# Patient Record
Sex: Female | Born: 1975
Health system: Southern US, Community
[De-identification: ages and names within clinical notes are randomized; demographics above are authoritative.]

## PROBLEM LIST (undated history)

## (undated) DIAGNOSIS — E119 Type 2 diabetes mellitus without complications: Secondary | ICD-10-CM

## (undated) DIAGNOSIS — I10 Essential (primary) hypertension: Secondary | ICD-10-CM

## (undated) DIAGNOSIS — R000 Tachycardia, unspecified: Secondary | ICD-10-CM

## (undated) DIAGNOSIS — D169 Benign neoplasm of bone and articular cartilage, unspecified: Secondary | ICD-10-CM

## (undated) DIAGNOSIS — F419 Anxiety disorder, unspecified: Secondary | ICD-10-CM

## (undated) DIAGNOSIS — I493 Ventricular premature depolarization: Secondary | ICD-10-CM

## (undated) DIAGNOSIS — R002 Palpitations: Secondary | ICD-10-CM

## (undated) DIAGNOSIS — I499 Cardiac arrhythmia, unspecified: Secondary | ICD-10-CM

## (undated) DIAGNOSIS — I340 Nonrheumatic mitral (valve) insufficiency: Secondary | ICD-10-CM

## (undated) DIAGNOSIS — E669 Obesity, unspecified: Secondary | ICD-10-CM

## (undated) DIAGNOSIS — Z1239 Encounter for other screening for malignant neoplasm of breast: Secondary | ICD-10-CM

## (undated) DIAGNOSIS — E611 Iron deficiency: Secondary | ICD-10-CM

## (undated) DIAGNOSIS — R220 Localized swelling, mass and lump, head: Secondary | ICD-10-CM

## (undated) HISTORY — DX: Type 2 diabetes mellitus without complications: E11.9

## (undated) HISTORY — DX: Tachycardia, unspecified: R00.0

## (undated) HISTORY — DX: Anxiety disorder, unspecified: F41.9

## (undated) HISTORY — DX: Nonrheumatic mitral (valve) insufficiency: I34.0

## (undated) HISTORY — DX: Localized swelling, mass and lump, head: R22.0

## (undated) HISTORY — PX: BUNIONECTOMY: SHX129

## (undated) HISTORY — DX: Ventricular premature depolarization: I49.3

## (undated) HISTORY — PX: OTHER SURGICAL HISTORY: SHX169

## (undated) HISTORY — PX: ANKLE FRACTURE SURGERY: SHX122

## (undated) HISTORY — DX: Palpitations: R00.2

## (undated) HISTORY — DX: Cardiac arrhythmia, unspecified: I49.9

## (undated) HISTORY — DX: Essential (primary) hypertension: I10

## (undated) HISTORY — DX: Encounter for other screening for malignant neoplasm of breast: Z12.39

---

## 1997-07-28 ENCOUNTER — Emergency Department (HOSPITAL_COMMUNITY): Admission: EM | Admit: 1997-07-28 | Discharge: 1997-07-28 | Payer: Self-pay | Admitting: *Deleted

## 1998-03-19 ENCOUNTER — Inpatient Hospital Stay (HOSPITAL_COMMUNITY): Admission: AD | Admit: 1998-03-19 | Discharge: 1998-03-24 | Payer: Self-pay | Admitting: Obstetrics & Gynecology

## 1998-04-15 ENCOUNTER — Ambulatory Visit (HOSPITAL_COMMUNITY): Admission: RE | Admit: 1998-04-15 | Discharge: 1998-04-15 | Payer: Self-pay | Admitting: Obstetrics

## 1998-04-21 ENCOUNTER — Encounter: Admission: RE | Admit: 1998-04-21 | Discharge: 1998-04-21 | Payer: Self-pay | Admitting: Pediatrics

## 1998-08-05 ENCOUNTER — Ambulatory Visit (HOSPITAL_COMMUNITY): Admission: RE | Admit: 1998-08-05 | Discharge: 1998-08-05 | Payer: Self-pay | Admitting: *Deleted

## 1998-08-05 ENCOUNTER — Encounter: Payer: Self-pay | Admitting: *Deleted

## 1998-09-04 ENCOUNTER — Inpatient Hospital Stay (HOSPITAL_COMMUNITY): Admission: AD | Admit: 1998-09-04 | Discharge: 1998-09-04 | Payer: Self-pay | Admitting: *Deleted

## 1998-09-16 ENCOUNTER — Inpatient Hospital Stay (HOSPITAL_COMMUNITY): Admission: AD | Admit: 1998-09-16 | Discharge: 1998-09-18 | Payer: Self-pay | Admitting: *Deleted

## 1998-09-21 ENCOUNTER — Encounter (HOSPITAL_COMMUNITY): Admission: RE | Admit: 1998-09-21 | Discharge: 1998-12-20 | Payer: Self-pay | Admitting: *Deleted

## 2000-10-12 ENCOUNTER — Other Ambulatory Visit: Admission: RE | Admit: 2000-10-12 | Discharge: 2000-10-12 | Payer: Self-pay | Admitting: Obstetrics and Gynecology

## 2002-04-16 ENCOUNTER — Other Ambulatory Visit: Admission: RE | Admit: 2002-04-16 | Discharge: 2002-04-16 | Payer: Self-pay | Admitting: Obstetrics and Gynecology

## 2003-06-11 ENCOUNTER — Emergency Department (HOSPITAL_COMMUNITY): Admission: EM | Admit: 2003-06-11 | Discharge: 2003-06-11 | Payer: Self-pay | Admitting: Family Medicine

## 2003-11-19 ENCOUNTER — Other Ambulatory Visit: Admission: RE | Admit: 2003-11-19 | Discharge: 2003-11-19 | Payer: Self-pay | Admitting: Obstetrics and Gynecology

## 2005-04-14 ENCOUNTER — Other Ambulatory Visit: Admission: RE | Admit: 2005-04-14 | Discharge: 2005-04-14 | Payer: Self-pay | Admitting: Obstetrics and Gynecology

## 2005-06-03 ENCOUNTER — Inpatient Hospital Stay (HOSPITAL_COMMUNITY): Admission: AD | Admit: 2005-06-03 | Discharge: 2005-06-04 | Payer: Self-pay | Admitting: Obstetrics and Gynecology

## 2006-01-04 ENCOUNTER — Inpatient Hospital Stay (HOSPITAL_COMMUNITY): Admission: AD | Admit: 2006-01-04 | Discharge: 2006-01-06 | Payer: Self-pay | Admitting: Obstetrics and Gynecology

## 2007-03-24 ENCOUNTER — Emergency Department (HOSPITAL_COMMUNITY): Admission: EM | Admit: 2007-03-24 | Discharge: 2007-03-24 | Payer: Self-pay | Admitting: Emergency Medicine

## 2009-02-07 ENCOUNTER — Emergency Department (HOSPITAL_COMMUNITY): Admission: EM | Admit: 2009-02-07 | Discharge: 2009-02-08 | Payer: Self-pay | Admitting: Emergency Medicine

## 2010-04-25 LAB — POCT I-STAT, CHEM 8
Calcium, Ion: 1.1 mmol/L — ABNORMAL LOW (ref 1.12–1.32)
Chloride: 106 mEq/L (ref 96–112)
HCT: 44 % (ref 36.0–46.0)
Sodium: 141 mEq/L (ref 135–145)

## 2010-04-25 LAB — POCT CARDIAC MARKERS
Myoglobin, poc: 40.6 ng/mL (ref 12–200)
Troponin i, poc: <0.05 ng/mL (ref 0.00–0.09)

## 2010-04-25 LAB — CBC
HCT: 40.7 % (ref 36.0–46.0)
Hemoglobin: 13.4 g/dL (ref 12.0–15.0)
MCHC: 33.1 g/dL (ref 30.0–36.0)
MCV: 85.8 fL (ref 78.0–100.0)
Platelets: 380 10*3/uL (ref 150–400)
RBC: 4.74 MIL/uL (ref 3.87–5.11)
RDW: 13.9 % (ref 11.5–15.5)
WBC: 6.9 10*3/uL (ref 4.0–10.5)

## 2010-04-25 LAB — DIFFERENTIAL
Basophils Relative: 0 % (ref 0–1)
Eosinophils Absolute: 0.1 10*3/uL (ref 0.0–0.7)
Eosinophils Relative: 1 % (ref 0–5)
Monocytes Absolute: 0.6 10*3/uL (ref 0.1–1.0)
Neutro Abs: 3.8 10*3/uL (ref 1.7–7.7)

## 2010-06-25 NOTE — Op Note (Signed)
Kaylee Maxwell, LATELLA             ACCOUNT NO.:  [id number]   MEDICAL RECORD NO.:  [id number]          PATIENT TYPE:  INP   LOCATION:  9106                          FACILITY:  [facility]   PHYSICIAN:  Janine Limbo, M.D.DATE OF BIRTH:  04/29/1975   DATE OF PROCEDURE:  01/05/2006  DATE OF DISCHARGE:                               OPERATIVE REPORT   PREOPERATIVE DIAGNOSIS:  Left breast abscess.   POSTOPERATIVE DIAGNOSIS:  Left breast abscess.   PROCEDURE:  Incision and drainage of left breast abscess.   SURGEON:  Dr. Leonard Schwartz.   FIRST ASSISTANT:  None.   ANESTHETIC:  Buffered 1% lidocaine.   DISPOSITION:  [name] is a 35 year old female who is one day after a  vaginal delivery of a healthy female infant.  The patient reports having a  pimple under her left breast that has now progressed to the point that  it is approximately 5 cm in size.  The area is very painful.  She has  never had a lesion similar to this in the past.  The patient understands  the indications for her surgical procedure, and she accepts the  associated risks.   FINDINGS:  A 5 cm abscess that was pointed was noted beneath the left  breast.  There was no evidence of spreading cellulitis.   PROCEDURE:  The patient was placed in a supine position.  The left  breast was prepped with multiple layers of Betadine.  The abscess was  injected with 3 mL of 1% lidocaine that was buffered.  An incision was  made in the abscess, and a moderate to large amount of purulent material  was drained.  A culture was obtained from the abscess cavity.  The  cavity was then packed using Iodoform gauze.  Hemostasis was adequate.  The patient tolerated her procedure well.  The area was then cleaned and  then dressed with sterile 4 x 4 gauze.   FOLLOW UP PLANS:  The patient will be placed on Keflex 500 mg three  times a day.  She will be observed in the hospital for one more day and  then will be discharged to home.  The  gauze will be removed on  01/06/2006.      Janine Limbo, M.D.  Electronically Signed     AVS/MEDQ  D:  01/05/2006  T:  01/05/2006  Job:  [id number]

## 2010-06-25 NOTE — H&P (Signed)
NAMEKEELIN, NEVILLE             ACCOUNT NO.:  [id number]   MEDICAL RECORD NO.:  [id number]          PATIENT TYPE:  INP   LOCATION:  NA                            FACILITY:  [facility]   PHYSICIAN:  Naima A. Dillard, M.D. DATE OF BIRTH:  [birth year]   DATE OF ADMISSION:  01/04/2006  DATE OF DISCHARGE:                              HISTORY & PHYSICAL   Ms. Riso is a 35 year old gravida 3, para 1-0-1-1 at 29 weeks who  presents today for induction secondary to [facility].  She had had some mild  elevations of her blood pressure over the last week.  She returned a 24-  hour urine sample to the office today and her blood pressure was 150/100  and 130/90.  She is therefore to be admitted for induction of labor  secondary to [facility].  Pregnancy has been remarkable for:  1. Rubella negative.  2. 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  3. First trimester Trichomonas.  4. Previous smoker.  5. Positive group B Strep.   PRENATAL LABS:  O positive, RH antibody negative.  VDRL nonreactive.  Rubella titer is not immune.  Hepatitis B surface antigen negative.  HIV  nonreactive.  Sickle cell test was negative.  HSV-1 glycoprotein was  negative.  HSV-2 glycoprotein was positive.  GC and Chlamydia cultures  were negative in the first trimester.  Pap in March had Trichomonas.  Hemoglobin upon entry into practice was 11.  It was within normal limits  at 26 weeks.  Patient had a 1-hour Glucola at approximately 22 weeks for  obesity.  Fasting blood sugar was 83, one hour was 165, two hour was  elevated at 183 and three hour was 127.  Dr. Pennie Rushing was consulted.  The  plan was made for fasting blood sugars and two hours PCs at 28, 32 and  36 weeks.  Fasting blood sugar at 29 weeks was 93, two hour PC was 95.  She did have some glycosuria.  Fasting blood sugar at 35 weeks was 82,  two hour after breakfast was 119.  CBGs were discontinued at 36 weeks.  Group B Strep culture was  positive at 36 weeks.   HISTORY OF PRESENT PREGNANCY:  Patient entered care at approximately 8  weeks.  She had an ultrasound at that time secondary to some first  trimester spotting.  She had Trichomonas on Pap which was treated.  Patient had history of HSV but had never had an outbreak but had been  told in the past she had HSV.  She had HSV-2 glycoprotein was positive.  Ultrasound at 19 weeks for normal growth.  She had an early Glucola  secondary to obesity.  She has had one abnormal value on the two hour  mark.  Plans were made to follow this up with fasting and two hour PCs  at intervals.  She did have some episodes of tachycardia.  These were  not associated with any dyspnea.  These were monitored and did resolve  by 23-24 weeks.  She had some fasting blood sugars  done through her  pregnancy that were all normal.  She did have some occasional glycosuria  but blood sugars were okay except for one of them when she had a sugared  soda on the way to the office. Group B Strep culture was positive.  Her  pressure was slightly elevated on her last visit.  She was sent home to  do a 24-hour urine. She brought that back today and her blood pressure  was 150/80 and 139/90.  She had a trace of protein in her urine. The  decision was made to admit her for induction.  She also has a boil on  her left breast that will be evaluated while she is here.   ALLERGIES:  NO KNOWN DRUG ALLERGIES.   OBSTETRICAL HISTORY:  In 2000, she had a vaginal birth of a female infant,  weight 6 pounds 7 ounces at 40 weeks. She was in labor 11 hours.  She  had epidural anesthesia.  Child was delivered by Dr. Wiliam Ke at [facility].  She did have some borderline anemia with her first pregnancy.  She had severe nausea and vomiting at that time.  In 2004, she had a  first trimester pregnancy termination.   PAST MEDICAL HISTORY:  1. History of abnormal Pap in the past but all have been normal since      then.  2.  In 2004, she was treated for Chlamydia.  3. She was diagnosed with HSV a number of years ago but had not had      any outbreaks since that time.  This was confirmed with positive HS-      2 glycoprotein titer.  4. She had trichomonas a number of years ago and also on this early      Pap.  5. She reports the usual childhood illnesses.   PAST SURGICAL HISTORY:  Surgery on her right ankle, right knee, left  ankle and left knee.  Her only other hospitalization was for childbirth.   FAMILY HISTORY:  Her paternal grandfather has a leaking heart valve.  Her paternal aunt, paternal grandmother and maternal first cousin all  have hypertension.  Her maternal aunt had thrombophlebitis.  Maternal  grandmother is diabetic on insulin.  The patient's maternal first cousin  had kidney failure.  Paternal grandmother has Alzheimer's.  Paternal  grandfather had a stroke.  Paternal grandfather also had rheumatoid  arthritis.  Paternal aunt had gallbladder cancer.   GENETIC HISTORY:  Patient's sister having sickle cell disease and  patient's maternal first cousin having sickle cell trait.  Patient's  maternal grandmother is a twin and her maternal first cousin has twins.   SOCIAL HISTORY:  Patient is single.  Father of the baby is involved and  supportive, his name is Donnetta Simpers.  Patient is Tree surgeon.  She  has two years of college.  She is a Actor.  Her partner  has a high school education.  He is a Child psychotherapist.  She denies any  alcohol, drug or tobacco use during this pregnancy.  She denies any  domestic violence.   PHYSICAL EXAMINATION:  VITAL SIGNS:  Blood pressure at the office was  150/100, 130/90.  Other vital signs were stable.  HEENT:  Within normal limits.  LUNGS:  Bilateral breath sounds are clear.  CARDIOVASCULAR:  Regular rate and rhythm without murmur.  BREASTS:  Soft and nontender. ABDOMEN:  Fundal height is approximately 38 cm.  Estimated fetal weight   is 7  to 7-1/2 pounds.  Uterine contractions are occasional and mild per  the patient's report.  CERVIX:  Cervical exam in the office was 1-2, 80%, vertex at -2 to -3  station but the vertex is not ballotable.  Fetal heart rate is in the  140s in the office with Doppler.  EXTREMITIES:  Deep tendon reflexes are 2+ without clonus.  There is  trace edema noted.   IMPRESSION:  1. Intrauterine pregnancy at 39 weeks.  2. Pregnancy induced hypertension.  3. Positive group B Strep.   PLAN:  1. Admit to birthing suite per consult with Dr. Pierre Bali. Dillard as      attending physician.  2. Routine certified nurse midwife orders.  3. Plan group B Strep prophylaxis with Penicillin G per standing      dosing.  4. Will watch blood pressure very carefully and will await 24-hour      urine.  If the patient's is preeclamptic, she will be transferred      to the physician's service.  If not, she will remain with the nurse      midwife service.      Renaldo Reel Emilee Hero, C.N.M.      Naima A. Normand Sloop, M.D.  Electronically Signed    VLL/MEDQ  D:  01/04/2006  T:  01/04/2006  Job:  [id number]

## 2010-10-29 LAB — URINE MICROSCOPIC-ADD ON

## 2010-10-29 LAB — I-STAT 8, (EC8 V) (CONVERTED LAB)
BUN: 4 — ABNORMAL LOW
Glucose, Bld: 115 — ABNORMAL HIGH
Hemoglobin: 13.6
Potassium: 3.3 — ABNORMAL LOW
Sodium: 139

## 2010-10-29 LAB — URINALYSIS, ROUTINE W REFLEX MICROSCOPIC
Bilirubin Urine: NEGATIVE
Nitrite: POSITIVE — AB
Specific Gravity, Urine: 1.021
Urobilinogen, UA: 1

## 2010-10-29 LAB — DIFFERENTIAL
Basophils Absolute: 0.1
Eosinophils Absolute: 0.1
Eosinophils Relative: 1
Lymphocytes Relative: 37

## 2010-10-29 LAB — CBC
HCT: 35.7 — ABNORMAL LOW
MCV: 80.4
Platelets: 459 — ABNORMAL HIGH
RDW: 14.3

## 2010-10-29 LAB — POCT CARDIAC MARKERS: Operator id: 282201

## 2012-02-07 ENCOUNTER — Emergency Department (HOSPITAL_COMMUNITY)
Admission: EM | Admit: 2012-02-07 | Discharge: 2012-02-07 | Disposition: A | Discharge status: Home / Self Care | Payer: Self-pay | Attending: Emergency Medicine | Admitting: Emergency Medicine

## 2012-02-07 ENCOUNTER — Encounter (HOSPITAL_COMMUNITY): Payer: Self-pay | Admitting: *Deleted

## 2012-02-07 DIAGNOSIS — J020 Streptococcal pharyngitis: Secondary | ICD-10-CM | POA: Insufficient documentation

## 2012-02-07 DIAGNOSIS — R509 Fever, unspecified: Secondary | ICD-10-CM | POA: Insufficient documentation

## 2012-02-07 LAB — RAPID STREP SCREEN (MED CTR MEBANE ONLY): Streptococcus, Group A Screen (Direct): POSITIVE — AB

## 2012-02-07 MED ORDER — AMOXICILLIN 500 MG PO CAPS: 500.0000 mg | ORAL_CAPSULE | Freq: Three times a day (TID) | ORAL | Status: DC

## 2012-02-07 MED ORDER — ACETAMINOPHEN 325 MG PO TABS
650.0000 mg | ORAL_TABLET | Freq: Once | ORAL | Status: AC
  Administered 2012-02-07: 650 mg via ORAL
  Filled 2012-02-07: qty 2

## 2012-02-07 MED ORDER — IBUPROFEN 600 MG PO TABS: 600.0000 mg | ORAL_TABLET | Freq: Three times a day (TID) | ORAL | Status: DC | PRN

## 2012-02-07 NOTE — ED Notes (Signed)
Reports having sore throat for over a week with fever/chills. Airway intact.

## 2012-02-07 NOTE — ED Provider Notes (Addendum)
History   This chart was scribed for Kaylee Roots, MD by Charolett Bumpers, ED Scribe. The patient was seen in room TR05C/TR05C. Patient's care was started at 1430.   CSN: [id number]  Arrival date & time 02/07/12  1338   First MD Initiated Contact with Patient 02/07/12 1430      Chief Complaint  Patient presents with  . Sore Throat    The history is provided by the patient. No language interpreter was used.  Kaylee Maxwell is a 36 y.o. female who presents to the Emergency Department complaining of persistent, severe sore throat. She reports she had a sore throat 2 weeks ago that improved. She states her symptoms worsened 4 days ago. She states her throat is sore on both sides. She reports associated fever and chills. She denies any headaches or rash. She denies any mono or strep exposure. No unilateral throat pain or swelling. No trouble breathing or swallowing.   History reviewed. No pertinent past medical history.  History reviewed. No pertinent past surgical history.  History reviewed. No pertinent family history.  History  Substance Use Topics  . Smoking status: Not on file  . Smokeless tobacco: Not on file  . Alcohol Use: No    OB History    Grav Para Term Preterm Abortions TAB SAB Ect Mult Living                  Review of Systems  Constitutional: Positive for fever and chills.  HENT: Positive for sore throat. Negative for trouble swallowing and neck pain.   Respiratory: Negative for cough and shortness of breath.   Skin: Negative for rash.  Neurological: Negative for headaches.  All other systems reviewed and are negative.    Allergies  Review of patient's allergies indicates no known allergies.  Home Medications   Current Outpatient Rx  Name  Route  Sig  Dispense  Refill  . PHENOL 1.4 % MT LIQD   Mouth/Throat   Use as directed 1 spray in the mouth or throat as needed. For sore throat           BP 155/108  Pulse 131  Temp 98.4 F (36.9  C) (Axillary)  Resp 20  SpO2 100%  LMP 01/31/2012  Physical Exam  Nursing note and vitals reviewed. Constitutional: She is oriented to person, place, and time. She appears well-developed and well-nourished. No distress.  HENT:  Head: Normocephalic and atraumatic.  Nose: Nose normal.       Pharynx erythematous, +exudate. No asymmetric swelling or abscess. No trismus.   Eyes: Conjunctivae normal and EOM are normal. No scleral icterus.  Neck: Neck supple. No tracheal deviation present.       Ant cerv l/a. No post cerv l/a. No stiffness or rigidity  Cardiovascular: Normal rate.   Pulmonary/Chest: Effort normal. No respiratory distress.  Abdominal: Soft. There is no tenderness.  Musculoskeletal: Normal range of motion. She exhibits edema.  Neurological: She is alert and oriented to person, place, and time. No cranial nerve deficit.  Skin: Skin is warm and dry. No rash noted.  Psychiatric: She has a normal mood and affect. Her behavior is normal.    ED Course  Procedures (including critical care time)  DIAGNOSTIC STUDIES: Oxygen Saturation is 100% on room air, normal by my interpretation.    COORDINATION OF CARE:  14:35-Discussed planned course of treatment with the patient including d/c home with Amoxil and ibuprofen, who is agreeable at this time.  Labs Reviewed  RAPID STREP SCREEN - Abnormal; Notable for the following:    Streptococcus, Group A Screen (Direct) POSITIVE (*)     All other components within normal limits      MDM  I personally performed the services described in this documentation, which was scribed in my presence. The recorded information has been reviewed and is accurate.  Confirmed nkda w pt.   rx amox and pain rx.   Tylenol po.   Hr 104. rr 16.     Kaylee Roots, MD 02/07/12 1442  Kaylee Roots, MD 02/07/12 [phone]

## 2012-07-07 ENCOUNTER — Emergency Department (HOSPITAL_COMMUNITY)
Admission: EM | Admit: 2012-07-07 | Discharge: 2012-07-07 | Disposition: A | Discharge status: Home / Self Care | Payer: Medicaid Other | Attending: Emergency Medicine | Admitting: Emergency Medicine

## 2012-07-07 ENCOUNTER — Encounter (HOSPITAL_COMMUNITY): Payer: Self-pay | Admitting: Family Medicine

## 2012-07-07 DIAGNOSIS — F17200 Nicotine dependence, unspecified, uncomplicated: Secondary | ICD-10-CM | POA: Insufficient documentation

## 2012-07-07 DIAGNOSIS — L03011 Cellulitis of right finger: Secondary | ICD-10-CM

## 2012-07-07 DIAGNOSIS — IMO0002 Reserved for concepts with insufficient information to code with codable children: Secondary | ICD-10-CM | POA: Insufficient documentation

## 2012-07-07 MED ORDER — CEPHALEXIN 500 MG PO CAPS: 500.0000 mg | ORAL_CAPSULE | Freq: Three times a day (TID) | ORAL | Status: DC

## 2012-07-07 MED ORDER — HYDROCODONE-ACETAMINOPHEN 5-325 MG PO TABS: 1.0000 | ORAL_TABLET | Freq: Four times a day (QID) | ORAL | Status: DC | PRN

## 2012-07-07 MED ORDER — IBUPROFEN 600 MG PO TABS: 600.0000 mg | ORAL_TABLET | Freq: Four times a day (QID) | ORAL | Status: DC | PRN

## 2012-07-07 NOTE — ED Provider Notes (Addendum)
History  This chart was scribed for non-physician practitioner Jaynie Crumble, PA-C working with Raeford Razor, MD, by Candelaria Stagers, ED Scribe. This patient was seen in room WTR6/WTR6 and the patient's care was started at 8:46 PM   CSN: [id number]  Arrival date & time 07/07/12  1944   First MD Initiated Contact with Patient 07/07/12 2020      Chief Complaint  Patient presents with  . Hand Pain     The history is provided by the patient. No language interpreter was used.   HPI Comments: Kaylee Maxwell is a 37 y.o. female who presents to the Emergency Department complaining of redness, swelling, and pain to her left middle finger that started about five days ago and has gradually worsened.  She denies recent injury or trauma.  Pt has tried nothing for the sx.  Pain worsened with palpation.    History reviewed. No pertinent past medical history.  History reviewed. No pertinent past surgical history.  No family history on file.  History  Substance Use Topics  . Smoking status: Current Every Day Smoker -- 0.50 packs/day    Types: Cigarettes  . Smokeless tobacco: Not on file  . Alcohol Use: No    OB History   Grav Para Term Preterm Abortions TAB SAB Ect Mult Living                  Review of Systems  Musculoskeletal:       Swelling, redness, and pain to the left middle finger.   All other systems reviewed and are negative.    Allergies  Review of patient's allergies indicates no known allergies.  Home Medications   Current Outpatient Rx  Name  Route  Sig  Dispense  Refill  . ibuprofen (ADVIL,MOTRIN) 200 MG tablet   Oral   Take 200 mg by mouth every 6 (six) hours as needed for pain.           BP 155/87  Pulse 109  Temp(Src) 97.7 F (36.5 C) (Oral)  Resp 17  Ht 5\' 4"  (1.626 m)  Wt 235 lb (106.595 kg)  BMI 40.32 kg/m2  SpO2 100%  LMP 07/04/2012  Physical Exam  Nursing note and vitals reviewed. Constitutional: She appears well-developed and  well-nourished. No distress.  HENT:  Head: Normocephalic.  Cardiovascular: Normal rate, regular rhythm and normal heart sounds.   Pulmonary/Chest: Effort normal and breath sounds normal. No respiratory distress. She has no wheezes. She has no rales.  Musculoskeletal: She exhibits no edema.  Neurological: She is alert.  Skin: Skin is warm and dry.  Paronychia to left middle finger. Following emotion at all joints. No swelling or erythema of the proximal finger.  Psychiatric: She has a normal mood and affect. Her behavior is normal.    ED Course  Procedures   DIAGNOSTIC STUDIES: Oxygen Saturation is 100% on room air, normal by my interpretation.    COORDINATION OF CARE:  8:49 PM Discussed course of care with pt which includes I&D.  Pt understands and agrees.   INCISION AND DRAINAGE PROCEDURE NOTE: Patient identification was confirmed and verbal consent was obtained. This procedure was performed by Jaynie Crumble, PA-C at 8:50 PM. Site: left middle finger paronychia  Sterile procedures observed Blade size: 11 blade Drainage: moderate pus Complexity: Complex Incision made over site, wound drained, covered with dry, sterile dressing.  Pt tolerated procedure well without complications.  Instructions for care discussed verbally and pt provided with additional written instructions for homecare and  f/u.  Labs Reviewed - No data to display No results found.   1. Paronychia, right       MDM  Pt with paronychia to the left middle finger. Drained in ED with moderate purulent drainage. Soaked in warm water with iodine. Home with soaks, wound care, keflex, follow up as needed. Ibuprofen and norco for pain.    I personally performed the services described in this documentation, which was scribed in my presence. The recorded information has been reviewed and is accurate.      Lottie Mussel, PA-C 07/08/12 0206  Lottie Mussel, PA-C 12/06/12 1546

## 2012-07-07 NOTE — ED Notes (Addendum)
Patient states that she has had swelling to her left middle finger for the past days. States swelling and pain getting worse. Denies injury. Swelling and erythema noted around nailbed of left middle finger.

## 2012-07-12 NOTE — ED Provider Notes (Signed)
Medical screening examination/treatment/procedure(s) were performed by non-physician practitioner and as supervising physician I was immediately available for consultation/collaboration.  Raeford Razor, MD 07/12/12 [phone]

## 2012-09-24 ENCOUNTER — Ambulatory Visit (INDEPENDENT_AMBULATORY_CARE_PROVIDER_SITE_OTHER): Payer: Self-pay | Admitting: Surgery

## 2012-10-09 ENCOUNTER — Encounter (INDEPENDENT_AMBULATORY_CARE_PROVIDER_SITE_OTHER): Payer: Self-pay | Admitting: Surgery

## 2012-10-09 ENCOUNTER — Ambulatory Visit (INDEPENDENT_AMBULATORY_CARE_PROVIDER_SITE_OTHER): Payer: Medicaid Other | Admitting: Surgery

## 2012-10-09 VITALS — BP 130/76 | HR 104 | Resp 16 | Ht 64.0 in | Wt 231.4 lb

## 2012-10-09 DIAGNOSIS — R220 Localized swelling, mass and lump, head: Secondary | ICD-10-CM

## 2012-10-09 DIAGNOSIS — R229 Localized swelling, mass and lump, unspecified: Secondary | ICD-10-CM

## 2012-10-09 HISTORY — DX: Localized swelling, mass and lump, head: R22.0

## 2012-10-09 NOTE — Progress Notes (Signed)
Patient ID: Kaylee Maxwell, female   DOB: 1975-09-07, 37 y.o.   MRN: [id number]  Chief Complaint  Patient presents with  . New Evaluation     eval mass on scalp    HPI Kaylee Maxwell is a 36 y.o. female.   HPI This is a very pleasant female referred by Dr. Mayford Knife for evaluation of a small mass on her scalp. She reports it has been there several years but is now getting much larger. She occasionally will hit creating bleeding.  It also causes mild discomfort. She is otherwise without complaints Past Medical History  Diagnosis Date  . Hypertension     Past Surgical History  Procedure Laterality Date  . Ankle fracture surgery      Family History  Problem Relation Age of Onset  . Cancer Paternal Aunt     pancreatic  . Cancer Maternal Grandmother     lung    Social History History  Substance Use Topics  . Smoking status: Current Every Day Smoker -- 0.50 packs/day    Types: Cigarettes  . Smokeless tobacco: Not on file  . Alcohol Use: No    No Known Allergies  Current Outpatient Prescriptions  Medication Sig Dispense Refill  . aspirin 325 MG tablet Take 325 mg by mouth daily.      Marland Kitchen lisinopril (PRINIVIL,ZESTRIL) 20 MG tablet Take 20 mg by mouth daily.       No current facility-administered medications for this visit.    Review of Systems Review of Systems  Constitutional: Negative for fever, chills and unexpected weight change.  HENT: Negative for hearing loss, congestion, sore throat, trouble swallowing and voice change.   Eyes: Negative for visual disturbance.  Respiratory: Negative for cough and wheezing.   Cardiovascular: Negative for chest pain, palpitations and leg swelling.  Gastrointestinal: Negative for nausea, vomiting, abdominal pain, diarrhea, constipation, blood in stool, abdominal distention and anal bleeding.  Genitourinary: Negative for hematuria, vaginal bleeding and difficulty urinating.  Musculoskeletal: Negative for arthralgias.  Skin:  Negative for rash and wound.  Neurological: Negative for seizures, syncope and headaches.  Hematological: Negative for adenopathy. Does not bruise/bleed easily.  Psychiatric/Behavioral: Negative for confusion.    Blood pressure 130/76, pulse 104, resp. rate 16, height 5\' 4"  (1.626 m), weight 231 lb 6.4 oz (104.962 kg).  Physical Exam Physical Exam  Constitutional: She is oriented to person, place, and time. She appears well-developed and well-nourished. No distress.  HENT:  Head: Normocephalic and atraumatic.  Right Ear: External ear normal.  Left Ear: External ear normal.  Nose: Nose normal.  Mouth/Throat: Oropharynx is clear and moist.  Eyes: Pupils are equal, round, and reactive to light.  Neck: Normal range of motion. Neck supple. No tracheal deviation present.  Cardiovascular: Normal rate and regular rhythm.   Pulmonary/Chest: Effort normal and breath sounds normal.  Neurological: She is alert and oriented to person, place, and time.  Skin:  There is a 3 cm soft tissue mass on a pedicle on the left side of her scalp.  Psychiatric: Her behavior is normal.    Data Reviewed   Assessment    3 cm scalp mass     Plan    Because this is quite large and may represent a malignancy, removal is recommended. I discussed the risks which includes but is not limited to bleeding, infection, need for further surgery, injury to shredding structures, et Karie Soda. She understands and wishes to proceed. Surgery will be scheduled  Kaylee Maxwell A 10/09/2012, 12:00 PM

## 2012-10-12 ENCOUNTER — Other Ambulatory Visit (INDEPENDENT_AMBULATORY_CARE_PROVIDER_SITE_OTHER): Payer: Self-pay | Admitting: *Deleted

## 2012-10-12 DIAGNOSIS — D1739 Benign lipomatous neoplasm of skin and subcutaneous tissue of other sites: Secondary | ICD-10-CM

## 2012-10-12 MED ORDER — HYDROCODONE-ACETAMINOPHEN 5-325 MG PO TABS: 1.0000 | ORAL_TABLET | ORAL | Status: DC | PRN

## 2012-10-22 ENCOUNTER — Telehealth (INDEPENDENT_AMBULATORY_CARE_PROVIDER_SITE_OTHER): Payer: Self-pay

## 2012-10-22 NOTE — Telephone Encounter (Signed)
Patient is requesting an post op appointment with  DR. Blackman appointment moved from 10-30-12 to [phone]  Dr. Magnus Ivan stressed she does need to make this appointment tomorrow due to the sutures need to be removed

## 2012-10-23 ENCOUNTER — Telehealth (INDEPENDENT_AMBULATORY_CARE_PROVIDER_SITE_OTHER): Payer: Self-pay

## 2012-10-23 ENCOUNTER — Encounter (INDEPENDENT_AMBULATORY_CARE_PROVIDER_SITE_OTHER): Payer: Medicaid Other | Admitting: Surgery

## 2012-10-23 NOTE — Telephone Encounter (Signed)
LMOV pt's appt rescheduled to 10/24/12 at 2:30pm.  Pt missed her appointment today.  She has sutures in her scalp that need to be removed.  If she is unable to come on the 17th, she will need to schedule a nurse only visit with Pattricia Boss.

## 2012-10-24 ENCOUNTER — Telehealth (INDEPENDENT_AMBULATORY_CARE_PROVIDER_SITE_OTHER): Payer: Self-pay | Admitting: General Surgery

## 2012-10-24 ENCOUNTER — Encounter (INDEPENDENT_AMBULATORY_CARE_PROVIDER_SITE_OTHER): Payer: Medicaid Other | Admitting: Surgery

## 2012-10-24 NOTE — Telephone Encounter (Signed)
Called patient back several time to get her to call me back so I can get her an different apt with Dr Magnus Ivan, patient not call back. She needs to speak to [facility] when she call she back up here.

## 2012-10-30 ENCOUNTER — Encounter (INDEPENDENT_AMBULATORY_CARE_PROVIDER_SITE_OTHER): Payer: Medicaid Other | Admitting: Surgery

## 2012-10-31 ENCOUNTER — Encounter (INDEPENDENT_AMBULATORY_CARE_PROVIDER_SITE_OTHER): Payer: Self-pay | Admitting: General Surgery

## 2012-10-31 ENCOUNTER — Encounter (INDEPENDENT_AMBULATORY_CARE_PROVIDER_SITE_OTHER): Payer: Self-pay | Admitting: Surgery

## 2012-10-31 ENCOUNTER — Ambulatory Visit (INDEPENDENT_AMBULATORY_CARE_PROVIDER_SITE_OTHER): Payer: Medicaid Other | Admitting: Surgery

## 2012-10-31 VITALS — BP 122/80 | HR 60 | Temp 98.6°F | Resp 14 | Ht 64.0 in | Wt 234.0 lb

## 2012-10-31 DIAGNOSIS — Z09 Encounter for follow-up examination after completed treatment for conditions other than malignant neoplasm: Secondary | ICD-10-CM

## 2012-10-31 NOTE — Progress Notes (Signed)
Subjective:     Patient ID: Kaylee Maxwell, female   DOB: 1975/12/21, 37 y.o.   MRN: [id number]  HPI She is here for first postop visit status post removal of a large lesion on her scalp. She is doing well and has no complaints  Review of Systems     Objective:   Physical Exam  on exam, the suture site is healing well.I removed her sutures. 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 She may resume her normal activity. I will see her back as needed

## 2012-12-07 NOTE — ED Provider Notes (Signed)
Medical screening examination/treatment/procedure(s) were performed by non-physician practitioner and as supervising physician I was immediately available for consultation/collaboration.  EKG Interpretation   None        Raeford Razor, MD 12/07/12 [phone]

## 2015-12-08 DIAGNOSIS — E119 Type 2 diabetes mellitus without complications: Secondary | ICD-10-CM | POA: Insufficient documentation

## 2015-12-08 DIAGNOSIS — I10 Essential (primary) hypertension: Secondary | ICD-10-CM | POA: Insufficient documentation

## 2015-12-08 DIAGNOSIS — R002 Palpitations: Secondary | ICD-10-CM | POA: Insufficient documentation

## 2015-12-08 DIAGNOSIS — E669 Obesity, unspecified: Secondary | ICD-10-CM | POA: Insufficient documentation

## 2015-12-08 DIAGNOSIS — E66811 Obesity, class 1: Secondary | ICD-10-CM | POA: Insufficient documentation

## 2016-04-11 DIAGNOSIS — R87612 Low grade squamous intraepithelial lesion on cytologic smear of cervix (LGSIL): Secondary | ICD-10-CM | POA: Insufficient documentation

## 2017-04-07 MED FILL — metFORMIN HCL 500 MG TABS: 500 | 30 days supply | Qty: 60 | Fill #0

## 2017-04-07 MED FILL — LISINOPRIL-HCTZ 20-12.5 MG: 20-12.5 | 30 days supply | Qty: 30 | Fill #0

## 2017-05-11 MED FILL — LISINOPRIL-HCTZ 20-12.5 MG: 20-12.5 | 30 days supply | Qty: 30 | Fill #1

## 2017-05-26 MED FILL — metFORMIN HCL 500 MG TABS: 500 | 30 days supply | Qty: 60 | Fill #1

## 2017-06-12 MED FILL — LISINOPRIL-HCTZ 20-12.5 MG: 20-12.5 | 90 days supply | Qty: 90 | Fill #2

## 2017-06-26 ENCOUNTER — Other Ambulatory Visit: Payer: Self-pay

## 2017-06-26 ENCOUNTER — Ambulatory Visit (INDEPENDENT_AMBULATORY_CARE_PROVIDER_SITE_OTHER): Payer: No Typology Code available for payment source | Admitting: Family Medicine

## 2017-06-26 ENCOUNTER — Encounter: Payer: Self-pay | Admitting: Family Medicine

## 2017-06-26 VITALS — BP 130/80 | HR 98 | Temp 99.2°F | Resp 18 | Ht 64.0 in | Wt 202.2 lb

## 2017-06-26 DIAGNOSIS — R002 Palpitations: Secondary | ICD-10-CM | POA: Diagnosis not present

## 2017-06-26 DIAGNOSIS — F17200 Nicotine dependence, unspecified, uncomplicated: Secondary | ICD-10-CM

## 2017-06-26 DIAGNOSIS — E119 Type 2 diabetes mellitus without complications: Secondary | ICD-10-CM

## 2017-06-26 NOTE — Progress Notes (Signed)
Chief Complaint  Patient presents with  . New Patient (Initial Visit)    heart flutters x few weeks    HPI  Heart Palpitations Pt reports that she has been having intermittent heart symptoms of hard beating and seems to beat faster.  The pattern seems irregular as well.  She states that she had a episode on 06/23/17 that last about an hour when in the past it only lasted a few seconds  She reports that Friday was a typical day for her No menses at that time She drank plenty of water She does not normally drink caffeine and did not drink any caffeine that day  Diabetes Mellitus Family history of heart attack She reports that she has type 2 diabetes that is well controlled with metformin  She does not exercise or check her sugars She reports that she is having good sugars and sticks to ADA diet She reports that her father's father had a heart attack She has a history of palpitations and the plan was for her to get outpatient holter monitoring  Lab Results  Component Value Date   HGBA1C 5.5 06/26/2017   Tobacco Use She smokes 1/2 pack of cigarettes per day She has been smoking for more than 10 years She denies cough, shortness of breath  Past Medical History:  Diagnosis Date  . Hypertension     Current Outpatient Medications  Medication Sig Dispense Refill  . aspirin 325 MG tablet Take 325 mg by mouth daily.    Marland Kitchen lisinopril (PRINIVIL,ZESTRIL) 20 MG tablet Take 20 mg by mouth daily.    Marland Kitchen HYDROcodone-acetaminophen (NORCO) 5-325 MG per tablet Take 1-2 tablets by mouth every 4 (four) hours as needed for pain (Given at discharge from [facility]). (Patient not taking: Reported on 06/26/2017) 30 tablet 0   No current facility-administered medications for this visit.     Allergies: No Known Allergies  Past Surgical History:  Procedure Laterality Date  . ANKLE FRACTURE SURGERY      Social History   Socioeconomic History  . Marital status: Single    Spouse name: Not on file  .  Number of children: Not on file  . Years of education: Not on file  . Highest education level: Not on file  Occupational History  . Not on file  Social Needs  . Financial resource strain: Not on file  . Food insecurity:    Worry: Not on file    Inability: Not on file  . Transportation needs:    Medical: Not on file    Non-medical: Not on file  Tobacco Use  . Smoking status: Current Every Day Smoker    Packs/day: 0.50    Types: Cigarettes  . Smokeless tobacco: Never Used  Substance and Sexual Activity  . Alcohol use: No  . Drug use: No  . Sexual activity: Yes  Lifestyle  . Physical activity:    Days per week: Not on file    Minutes per session: Not on file  . Stress: Not on file  Relationships  . Social connections:    Talks on phone: Not on file    Gets together: Not on file    Attends religious service: Not on file    Active member of club or organization: Not on file    Attends meetings of clubs or organizations: Not on file    Relationship status: Not on file  Other Topics Concern  . Not on file  Social History Narrative  . Not on  file    Family History  Problem Relation Age of Onset  . Cancer Paternal Aunt        pancreatic  . Cancer Maternal Grandmother        lung     ROS Review of Systems See HPI Constitution: No fevers or chills No malaise No diaphoresis Skin: No rash or itching Eyes: no blurry vision, no double vision GU: no dysuria or hematuria Neuro: no dizziness or headaches all others reviewed and negative   Objective: Vitals:   06/26/17 1637  BP: 130/80  Pulse: 98  Resp: 18  Temp: 99.2 F (37.3 C)  TempSrc: Oral  SpO2: 100%  Weight: 202 lb 3.2 oz (91.7 kg)  Height: 5' 4"  (1.626 m)    Physical Exam  Constitutional: She is oriented to person, place, and time. She appears well-developed and well-nourished.  HENT:  Head: Normocephalic and atraumatic.  Eyes: Conjunctivae and EOM are normal.  Cardiovascular: Normal rate, regular  rhythm and normal heart sounds.  No murmur heard. Pulmonary/Chest: Effort normal and breath sounds normal. No stridor. No respiratory distress. She has no wheezes.  Neurological: She is alert and oriented to person, place, and time.  Skin: Skin is warm. Capillary refill takes less than 2 seconds.  Psychiatric: She has a normal mood and affect. Her behavior is normal. Judgment and thought content normal.     EKG - nsr, no t wave changes or inversions  Assessment and Plan Sinia was seen today for new patient (initial visit).  Diagnoses and all orders for this visit:  Heart palpitations -     EKG 12-Lead -     CBC -     Hemoglobin A1c -     Comprehensive metabolic panel -     TSH + free T4 -     Holter monitor - 72 hour; Future Advised holter monitoring Continue hydration  Tobacco use disorder Discussed biochemical addiction to nicotine Also discussed that there is also a hand-to-mouth component Discussed ways to quit smoking including Chantix, Zyban, Zoloft and Nicotine replacement options Discuss behavioral modifications and non-pharmacologic options like acupuncture and hypnosis Discussed smoking cessation hotlines and support groups Pt is in pre-contemplation stage of change 3 minutes spent to discuss smoking  Type 2 diabetes mellitus without complication, without long-term current use of insulin (HCC) Diabetes stable Continue diabetic meds   A total of 40 minutes were spent face-to-face with the patient during this encounter and over half of that time was spent on counseling and coordination of care.  Independently reviewed ECG and discussed differential for the palpitations as well as discussing her family history.      Kaylee Maxwell

## 2017-06-26 NOTE — Patient Instructions (Addendum)
     IF you received an x-ray today, you will receive an invoice from [facility]. Please contact [facility] at [phone] with questions or concerns regarding your invoice.   IF you received labwork today, you will receive an invoice from [facility]. Please contact [facility] at [phone] with questions or concerns regarding your invoice.   Our billing staff will not be able to assist you with questions regarding bills from these companies.  You will be contacted with the lab results as soon as they are available. The fastest way to get your results is to activate your My Chart account. Instructions are located on the last page of this paperwork. If you have not heard from [place] regarding the results in 2 weeks, please contact this office.     Palpitations A palpitation is the feeling that your heartbeat is irregular or is faster than normal. It may feel like your heart is fluttering or skipping a beat. Palpitations are usually not a serious problem. They may be caused by many things, including smoking, caffeine, alcohol, stress, and certain medicines. Although most causes of palpitations are not serious, palpitations can be a sign of a serious medical problem. In some cases, you may need further medical evaluation. Follow these instructions at home: Pay attention to any changes in your symptoms. Take these actions to help with your condition:  Avoid the following: ? Caffeinated coffee, tea, soft drinks, diet pills, and energy drinks. ? Chocolate. ? Alcohol.  Do not use any tobacco products, such as cigarettes, chewing tobacco, and e-cigarettes. If you need help quitting, ask your health care provider.  Try to reduce your stress and anxiety. Things that can help you relax include: ? Yoga. ? Meditation. ? Physical activity, such as swimming, jogging, or walking. ? Biofeedback. This is a method that helps you learn to use your mind to control things in your body, such as  your heartbeats.  Get plenty of rest and sleep.  Take over-the-counter and prescription medicines only as told by your health care provider.  Keep all follow-up visits as told by your health care provider. This is important.  Contact a health care provider if:  You continue to have a fast or irregular heartbeat after 24 hours.  Your palpitations occur more often. Get help right away if:  You have chest pain or shortness of breath.  You have a severe headache.  You feel dizzy or you faint. This information is not intended to replace advice given to you by your health care provider. Make sure you discuss any questions you have with your health care provider. Document Released: 01/22/2000 Document Revised: 06/29/2015 Document Reviewed: 10/09/2014 Elsevier Interactive Patient Education  [employer].

## 2017-06-27 LAB — CBC
HEMATOCRIT: 33.8 % — AB (ref 34.0–46.6)
HEMOGLOBIN: 11 g/dL — AB (ref 11.1–15.9)
MCH: 28.2 pg (ref 26.6–33.0)
MCHC: 32.5 g/dL (ref 31.5–35.7)
MCV: 87 fL (ref 79–97)
Platelets: 413 10*3/uL (ref 150–450)
RBC: 3.9 x10E6/uL (ref 3.77–5.28)
RDW: 15.7 % — AB (ref 12.3–15.4)
WBC: 6.1 10*3/uL (ref 3.4–10.8)

## 2017-06-27 LAB — COMPREHENSIVE METABOLIC PANEL
A/G RATIO: 1.4 (ref 1.2–2.2)
ALBUMIN: 4.2 g/dL (ref 3.5–5.5)
ALT: 37 IU/L — ABNORMAL HIGH (ref 0–32)
AST: 29 IU/L (ref 0–40)
Alkaline Phosphatase: 66 IU/L (ref 39–117)
BILIRUBIN TOTAL: 0.2 mg/dL (ref 0.0–1.2)
BUN / CREAT RATIO: 16 (ref 9–23)
BUN: 17 mg/dL (ref 6–24)
CHLORIDE: 101 mmol/L (ref 96–106)
CO2: 19 mmol/L — ABNORMAL LOW (ref 20–29)
Calcium: 9.3 mg/dL (ref 8.7–10.2)
Creatinine, Ser: 1.08 mg/dL — ABNORMAL HIGH (ref 0.57–1.00)
GFR, EST AFRICAN AMERICAN: 73 mL/min/{1.73_m2} (ref >59)
GFR, EST NON AFRICAN AMERICAN: 63 mL/min/{1.73_m2} (ref >59)
GLUCOSE: 88 mg/dL (ref 65–99)
Globulin, Total: 3.1 g/dL (ref 1.5–4.5)
Potassium: 4.5 mmol/L (ref 3.5–5.2)
Sodium: 139 mmol/L (ref 134–144)
TOTAL PROTEIN: 7.3 g/dL (ref 6.0–8.5)

## 2017-06-27 LAB — HEMOGLOBIN A1C
Est. average glucose Bld gHb Est-mCnc: 111 mg/dL
Hgb A1c MFr Bld: 5.5 % (ref 4.8–5.6)

## 2017-06-27 LAB — TSH+FREE T4
Free T4: 0.98 ng/dL (ref 0.82–1.77)
TSH: 1.92 u[IU]/mL (ref 0.450–4.500)

## 2017-07-06 MED FILL — metFORMIN HCL 500 MG TABS: 500 | 30 days supply | Qty: 60 | Fill #2

## 2017-07-20 ENCOUNTER — Telehealth: Payer: Self-pay | Admitting: Family Medicine

## 2017-07-20 NOTE — Telephone Encounter (Signed)
MYCHART MESSAGE SENT TO PT ABOUT RESCHEDULING HER APT ON 07/27/17 WITH STALLINGS

## 2017-07-27 ENCOUNTER — Ambulatory Visit: Payer: No Typology Code available for payment source | Admitting: Family Medicine

## 2017-08-07 ENCOUNTER — Encounter: Payer: Self-pay | Admitting: Family Medicine

## 2017-08-09 MED FILL — metFORMIN HCL 500 MG TABS: 500 | 30 days supply | Qty: 60 | Fill #0

## 2017-08-14 ENCOUNTER — Telehealth: Payer: Self-pay | Admitting: Family Medicine

## 2017-08-14 NOTE — Telephone Encounter (Signed)
Copied from [place] [id number]. Topic: Referral - Status >> Aug 14, 2017 11:21 AM Scherrie Gerlach wrote: Reason for CRM: pt referred for holter monitor Jun 26, 2017 and never heard anything back. Please advise

## 2017-08-14 NOTE — Telephone Encounter (Signed)
Left a note for Deneise Lever or Peter Congo in referrals to check if Holter monitor was ordered. I looked in patient's chart and I am not sure if it was done. Patient was in the office on 06/26/2017 for palpitations.

## 2017-08-15 NOTE — Telephone Encounter (Signed)
Looks like an order was placed on 06/26/17. Referrals does not process these orders, as we are unable to see when orders are placed. Please advise. Not sure who generally processes these.

## 2017-08-23 ENCOUNTER — Other Ambulatory Visit: Payer: Self-pay

## 2017-08-23 ENCOUNTER — Ambulatory Visit (INDEPENDENT_AMBULATORY_CARE_PROVIDER_SITE_OTHER): Payer: No Typology Code available for payment source | Admitting: Family Medicine

## 2017-08-23 ENCOUNTER — Encounter: Payer: Self-pay | Admitting: Family Medicine

## 2017-08-23 VITALS — BP 101/65 | HR 116 | Temp 99.0°F | Resp 17 | Ht 65.75 in | Wt 194.8 lb

## 2017-08-23 DIAGNOSIS — D508 Other iron deficiency anemias: Secondary | ICD-10-CM

## 2017-08-23 DIAGNOSIS — Z0000 Encounter for general adult medical examination without abnormal findings: Secondary | ICD-10-CM | POA: Diagnosis not present

## 2017-08-23 DIAGNOSIS — Z124 Encounter for screening for malignant neoplasm of cervix: Secondary | ICD-10-CM

## 2017-08-23 NOTE — Progress Notes (Signed)
Chief Complaint  Patient presents with  . Annual Exam    cpe with pap    Subjective:  Kaylee Maxwell is a 42 y.o. female here for a health maintenance visit.  Patient is established pt   Patient Active Problem List   Diagnosis Date Noted  . Scalp mass 10/09/2012    Past Medical History:  Diagnosis Date  . Hypertension     Past Surgical History:  Procedure Laterality Date  . ANKLE FRACTURE SURGERY       Outpatient Medications Prior to Visit  Medication Sig Dispense Refill  . aspirin 325 MG tablet Take 325 mg by mouth daily.    Marland Kitchen lisinopril (PRINIVIL,ZESTRIL) 20 MG tablet Take 20 mg by mouth daily.    . metFORMIN (GLUCOPHAGE) 500 MG tablet Take by mouth 2 (two) times daily with a meal.    . HYDROcodone-acetaminophen (NORCO) 5-325 MG per tablet Take 1-2 tablets by mouth every 4 (four) hours as needed for pain (Given at discharge from [facility]). (Patient not taking: Reported on 06/26/2017) 30 tablet 0   No facility-administered medications prior to visit.     No Known Allergies   Family History  Problem Relation Age of Onset  . Cancer Paternal Aunt        pancreatic  . Cancer Maternal Grandmother        lung     Health Habits: Dental Exam: up to date Eye Exam: up to date Exercise: 0 times/week on average   Social History   Socioeconomic History  . Marital status: Single    Spouse name: Not on file  . Number of children: Not on file  . Years of education: Not on file  . Highest education level: Not on file  Occupational History  . Not on file  Social Needs  . Financial resource strain: Not on file  . Food insecurity:    Worry: Not on file    Inability: Not on file  . Transportation needs:    Medical: Not on file    Non-medical: Not on file  Tobacco Use  . Smoking status: Current Every Day Smoker    Packs/day: 0.50    Types: Cigarettes  . Smokeless tobacco: Never Used  Substance and Sexual Activity  . Alcohol use: No  . Drug use: No  . Sexual  activity: Yes  Lifestyle  . Physical activity:    Days per week: Not on file    Minutes per session: Not on file  . Stress: Not on file  Relationships  . Social connections:    Talks on phone: Not on file    Gets together: Not on file    Attends religious service: Not on file    Active member of club or organization: Not on file    Attends meetings of clubs or organizations: Not on file    Relationship status: Not on file  . Intimate partner violence:    Fear of current or ex partner: Not on file    Emotionally abused: Not on file    Physically abused: Not on file    Forced sexual activity: Not on file  Other Topics Concern  . Not on file  Social History Narrative  . Not on file   Social History   Substance and Sexual Activity  Alcohol Use No   Social History   Tobacco Use  Smoking Status Current Every Day Smoker  . Packs/day: 0.50  . Types: Cigarettes  Smokeless Tobacco Never Used  Social History   Substance and Sexual Activity  Drug Use No    GYN: Sexual Health Menstrual status: regular menses LMP: Patient's last menstrual period was 08/02/2017. Last pap smear: see HM section History of abnormal pap smears:  Sexually active: ** with ** partner Current contraception:   Health Maintenance: See under health Maintenance activity for review of completion dates as well.  There is no immunization history on file for this patient.    Depression Screen-PHQ2/9 Depression screen [facility] 2/9 08/23/2017 06/26/2017  Decreased Interest 0 0  Down, Depressed, Hopeless 0 0  PHQ - 2 Score 0 0       Depression Severity and Treatment Recommendations:  0-4= None  5-9= Mild / Treatment: Support, educate to call if worse; return in one month  10-14= Moderate / Treatment: Support, watchful waiting; Antidepressant or Psycotherapy  15-19= Moderately severe / Treatment: Antidepressant OR Psychotherapy  >= 20 = Major depression, severe / Antidepressant AND  Psychotherapy    Review of Systems   Review of Systems  Constitutional: Negative for chills and fever.  HENT: Negative for congestion and sinus pain.   Respiratory: Negative for cough, shortness of breath and wheezing.   Cardiovascular: Positive for palpitations. Negative for chest pain.       Palpitations are much less than in May 2019  Gastrointestinal: Negative for abdominal pain, nausea and vomiting.  Genitourinary: Negative for dysuria, frequency and urgency.  Skin: Negative for itching and rash.  Neurological: Negative for dizziness, tingling and headaches.  Psychiatric/Behavioral: Negative for depression. The patient is not nervous/anxious and does not have insomnia.     See HPI for ROS as well.    Objective:   Vitals:   08/23/17 1610  BP: 101/65  Pulse: (!) 116  Resp: 17  Temp: 99 F (37.2 C)  TempSrc: Oral  SpO2: 100%  Weight: 194 lb 12.8 oz (88.4 kg)  Height: 5' 5.75" (1.67 m)    Body mass index is 31.68 kg/m.  Physical Exam  BP 101/65 (BP Location: Left Arm, Patient Position: Sitting, Cuff Size: Normal)   Pulse (!) 116   Temp 99 F (37.2 C) (Oral)   Resp 17   Ht 5' 5.75" (1.67 m)   Wt 194 lb 12.8 oz (88.4 kg)   LMP 08/02/2017   SpO2 100%   BMI 31.68 kg/m   General Appearance:    Alert, cooperative, no distress, appears stated age  Head:    Normocephalic, without obvious abnormality, atraumatic  Eyes:    PERRL, conjunctiva/corneas clear, EOM's intact, fundi    benign, both eyes  Ears:    Normal TM's and external ear canals, both ears  Nose:   Nares normal, septum midline, mucosa normal, no drainage    or sinus tenderness  Throat:   Lips, mucosa, and tongue normal; teeth and gums normal  Neck:   Supple, symmetrical, trachea midline, no adenopathy;    thyroid:  no enlargement/tenderness/nodules; no carotid   bruit or JVD  Back:     Symmetric, no curvature, ROM normal, no CVA tenderness  Lungs:     Clear to auscultation bilaterally, respirations  unlabored  Chest Wall:    No tenderness or deformity   Heart:    Regular rate and rhythm, S1 and S2 normal, no murmur, rub   or gallop  Breast Exam:    No tenderness, masses, or nipple abnormality  Abdomen:     Soft, non-tender, bowel sounds active all four quadrants,    no masses,  no organomegaly Chaperone present  Genitalia:    Normal female without lesion, discharge or tenderness, uterus midline, nontender, no ovarian masses, pap smear performed  Rectal:    Normal tone, normal prostate, no masses or tenderness;   guaiac negative stool  Extremities:   Extremities normal, atraumatic, no cyanosis or edema  Pulses:   2+ and symmetric all extremities  Skin:   Skin color, texture, turgor normal, no rashes or lesions  Lymph nodes:   Cervical, supraclavicular, and axillary nodes normal  Neurologic:   CNII-XII intact, normal strength, sensation and reflexes    throughout      Assessment/Plan:   Patient was seen for a health maintenance exam.  Counseled the patient on health maintenance issues. Reviewed her health mainteance schedule and ordered appropriate tests (see orders.) Counseled on regular exercise and weight management. Recommend regular eye exams and dental cleaning.   The following issues were addressed today for health maintenance:   Arneisha was seen today for annual exam.  Diagnoses and all orders for this visit:  Encounter for health maintenance examination in adult- Women's Health Maintenance Plan Advised monthly breast exam and annual mammogram Advised dental exam every six months Discussed stress management Discussed pap smear screening guidelines    Pap smear for cervical cancer screening- pap smear screening guidelines reviewed Pap with HPV done since pt >30 -     Pap IG, CT/NG NAA, and HPV (high risk) [facility]  Iron deficiency anemia secondary to inadequate dietary iron intake- discussed iron rich foods, iron def can worsen palpitations     Return  in about 3 months (around 11/23/2017) for recheck iron levels and ferritin.    Body mass index is 31.68 kg/m.:  Discussed the patient's BMI with patient. The BMI body mass index is 31.68 kg/m.     Future Appointments  Date Time Provider [place]  11/23/2017  3:20 PM Forrest Moron, MD PCP-PCP [facility]    Patient Instructions   Your iron levels are low.  Please pick up Slow Fe over the counter and take this once a day.  Slow Fe will increase your iron levels and is easier on the stomach.  When you take an iron supplement do not take it with milk or dairy.  Increase your iron rich foods, or cook in a cast iron pot to absorb some of the iron from the pot into your food.  In addition to that take a vitamin C supplement because this helps your body absorb the iron even better.  Return to clinic in 3 months for recheck.      IF you received an x-ray today, you will receive an invoice from [facility]. Please contact [facility] at [phone] with questions or concerns regarding your invoice.   IF you received labwork today, you will receive an invoice from [place]. Please contact [facility] at [phone] with questions or concerns regarding your invoice.   Our billing staff will not be able to assist you with questions regarding bills from these companies.  You will be contacted with the lab results as soon as they are available. The fastest way to get your results is to activate your My Chart account. Instructions are located on the last page of this paperwork. If you have not heard from [place] regarding the results in 2 weeks, please contact this office.     Iron-Rich Diet Iron is a mineral that helps your body to produce hemoglobin. Hemoglobin is a protein in your red blood  cells that carries oxygen to your body's tissues. Eating too little iron may cause you to feel weak and tired, and it can increase your risk for infection. Eating enough iron is necessary for  your body's metabolism, muscle function, and nervous system. Iron is naturally found in many foods. It can also be added to foods or fortified in foods. There are two types of dietary iron:  Heme iron. Heme iron is absorbed by the body more easily than nonheme iron. Heme iron is found in meat, poultry, and fish.  Nonheme iron. Nonheme iron is found in dietary supplements, iron-fortified grains, beans, and vegetables.  You may need to follow an iron-rich diet if:  You have been diagnosed with iron deficiency or iron-deficiency anemia.  You have a condition that prevents you from absorbing dietary iron, such as: ? Infection in your intestines. ? Celiac disease. This involves long-lasting (chronic) inflammation of your intestines.  You do not eat enough iron.  You eat a diet that is high in foods that impair iron absorption.  You have lost a lot of blood.  You have heavy bleeding during your menstrual cycle.  You are pregnant.  What is my plan? Your health care provider may help you to determine how much iron you need per day based on your condition. Generally, when a person consumes sufficient amounts of iron in the diet, the following iron needs are met:  Men. ? 29-55 years old: 11 mg per day. ? 49-13 years old: 8 mg per day.  Women. ? 33-49 years old: 15 mg per day. ? 67-48 years old: 18 mg per day. ? Over 31 years old: 8 mg per day. ? Pregnant women: 27 mg per day. ? Breastfeeding women: 9 mg per day.  What do I need to know about an iron-rich diet?  Eat fresh fruits and vegetables that are high in vitamin C along with foods that are high in iron. This will help increase the amount of iron that your body absorbs from food, especially with foods containing nonheme iron. Foods that are high in vitamin C include oranges, peppers, tomatoes, and mango.  Take iron supplements only as directed by your health care provider. Overdose of iron can be life-threatening. If you were  prescribed iron supplements, take them with orange juice or a vitamin C supplement.  Cook foods in pots and pans that are made from iron.  Eat nonheme iron-containing foods alongside foods that are high in heme iron. This helps to improve your iron absorption.  Certain foods and drinks contain compounds that impair iron absorption. Avoid eating these foods in the same meal as iron-rich foods or with iron supplements. These include: ? Coffee, black tea, and red wine. ? Milk, dairy products, and foods that are high in calcium. ? Beans, soybeans, and peas. ? Whole grains.  When eating foods that contain both nonheme iron and compounds that impair iron absorption, follow these tips to absorb iron better. ? Soak beans overnight before cooking. ? Soak whole grains overnight and drain them before using. ? Ferment flours before baking, such as using yeast in bread dough. What foods can I eat? Grains Iron-fortified breakfast cereal. Iron-fortified whole-wheat bread. Enriched rice. Sprouted grains. Vegetables Spinach. Potatoes with skin. Green peas. Broccoli. Red and green bell peppers. Fermented vegetables. Fruits Prunes. Raisins. Oranges. Strawberries. Mango Meats and Other Protein Sources Beef liver. Oysters. Beef. Shrimp. [place]. Chicken. [place]. Sardines. Chickpeas. Nuts. Tofu. Beverages Tomato juice. Fresh orange juice. Prune juice. Hibiscus  tea. Fortified instant breakfast shakes. Condiments Tahini. Fermented soy sauce. Sweets and Desserts Black-strap molasses. Other Wheat germ. The items listed above may not be a complete list of recommended foods or beverages. Contact your dietitian for more options. What foods are not recommended? Grains Whole grains. Bran cereal. Bran flour. Oats. Vegetables Artichokes. Brussels sprouts. Kale. Fruits Blueberries. Raspberries. Strawberries. Figs. Meats and Other Protein Sources Soybeans. Products made from soy protein. Dairy Milk. Cream.  Cheese. Yogurt. Cottage cheese. Beverages Coffee. Black tea. Red wine. Sweets and Desserts Cocoa. Chocolate. Ice cream. Other Basil. Oregano. Parsley. The items listed above may not be a complete list of foods and beverages to avoid. Contact your dietitian for more information. This information is not intended to replace advice given to you by your health care provider. Make sure you discuss any questions you have with your health care provider. Document Released: 09/07/2004 Document Revised: 08/14/2015 Document Reviewed: 08/21/2013 Elsevier Interactive Patient Education  [employer].

## 2017-08-23 NOTE — Patient Instructions (Addendum)
Your iron levels are low.  Please pick up Slow Fe over the counter and take this once a day.  Slow Fe will increase your iron levels and is easier on the stomach.  When you take an iron supplement do not take it with milk or dairy.  Increase your iron rich foods, or cook in a cast iron pot to absorb some of the iron from the pot into your food.  In addition to that take a vitamin C supplement because this helps your body absorb the iron even better.  Return to clinic in 3 months for recheck.      IF you received an x-ray today, you will receive an invoice from [facility]. Please contact [facility] at [phone] with questions or concerns regarding your invoice.   IF you received labwork today, you will receive an invoice from [facility]. Please contact [facility] at [phone] with questions or concerns regarding your invoice.   Our billing staff will not be able to assist you with questions regarding bills from these companies.  You will be contacted with the lab results as soon as they are available. The fastest way to get your results is to activate your My Chart account. Instructions are located on the last page of this paperwork. If you have not heard from [place] regarding the results in 2 weeks, please contact this office.     Iron-Rich Diet Iron is a mineral that helps your body to produce hemoglobin. Hemoglobin is a protein in your red blood cells that carries oxygen to your body's tissues. Eating too little iron may cause you to feel weak and tired, and it can increase your risk for infection. Eating enough iron is necessary for your body's metabolism, muscle function, and nervous system. Iron is naturally found in many foods. It can also be added to foods or fortified in foods. There are two types of dietary iron:  Heme iron. Heme iron is absorbed by the body more easily than nonheme iron. Heme iron is found in meat, poultry, and fish.  Nonheme iron. Nonheme iron is  found in dietary supplements, iron-fortified grains, beans, and vegetables.  You may need to follow an iron-rich diet if:  You have been diagnosed with iron deficiency or iron-deficiency anemia.  You have a condition that prevents you from absorbing dietary iron, such as: ? Infection in your intestines. ? Celiac disease. This involves long-lasting (chronic) inflammation of your intestines.  You do not eat enough iron.  You eat a diet that is high in foods that impair iron absorption.  You have lost a lot of blood.  You have heavy bleeding during your menstrual cycle.  You are pregnant.  What is my plan? Your health care provider may help you to determine how much iron you need per day based on your condition. Generally, when a person consumes sufficient amounts of iron in the diet, the following iron needs are met:  Men. ? 10-30 years old: 11 mg per day. ? 41-42 years old: 8 mg per day.  Women. ? 52-64 years old: 15 mg per day. ? 17-34 years old: 18 mg per day. ? Over 46 years old: 8 mg per day. ? Pregnant women: 27 mg per day. ? Breastfeeding women: 9 mg per day.  What do I need to know about an iron-rich diet?  Eat fresh fruits and vegetables that are high in vitamin C along with foods that are high in iron. This will help increase the amount  of iron that your body absorbs from food, especially with foods containing nonheme iron. Foods that are high in vitamin C include oranges, peppers, tomatoes, and mango.  Take iron supplements only as directed by your health care provider. Overdose of iron can be life-threatening. If you were prescribed iron supplements, take them with orange juice or a vitamin C supplement.  Cook foods in pots and pans that are made from iron.  Eat nonheme iron-containing foods alongside foods that are high in heme iron. This helps to improve your iron absorption.  Certain foods and drinks contain compounds that impair iron absorption. Avoid eating  these foods in the same meal as iron-rich foods or with iron supplements. These include: ? Coffee, black tea, and red wine. ? Milk, dairy products, and foods that are high in calcium. ? Beans, soybeans, and peas. ? Whole grains.  When eating foods that contain both nonheme iron and compounds that impair iron absorption, follow these tips to absorb iron better. ? Soak beans overnight before cooking. ? Soak whole grains overnight and drain them before using. ? Ferment flours before baking, such as using yeast in bread dough. What foods can I eat? Grains Iron-fortified breakfast cereal. Iron-fortified whole-wheat bread. Enriched rice. Sprouted grains. Vegetables Spinach. Potatoes with skin. Green peas. Broccoli. Red and green bell peppers. Fermented vegetables. Fruits Prunes. Raisins. Oranges. Strawberries. Mango Meats and Other Protein Sources Beef liver. Oysters. Beef. Shrimp. [place]. Chicken. [place]. Sardines. Chickpeas. Nuts. Tofu. Beverages Tomato juice. Fresh orange juice. Prune juice. Hibiscus tea. Fortified instant breakfast shakes. Condiments Tahini. Fermented soy sauce. Sweets and Desserts Black-strap molasses. Other Wheat germ. The items listed above may not be a complete list of recommended foods or beverages. Contact your dietitian for more options. What foods are not recommended? Grains Whole grains. Bran cereal. Bran flour. Oats. Vegetables Artichokes. Brussels sprouts. Kale. Fruits Blueberries. Raspberries. Strawberries. Figs. Meats and Other Protein Sources Soybeans. Products made from soy protein. Dairy Milk. Cream. Cheese. Yogurt. Cottage cheese. Beverages Coffee. Black tea. Red wine. Sweets and Desserts Cocoa. Chocolate. Ice cream. Other Basil. Oregano. Parsley. The items listed above may not be a complete list of foods and beverages to avoid. Contact your dietitian for more information. This information is not intended to replace advice given to you by  your health care provider. Make sure you discuss any questions you have with your health care provider. Document Released: 09/07/2004 Document Revised: 08/14/2015 Document Reviewed: 08/21/2013 Elsevier Interactive Patient Education  [employer].

## 2017-08-25 LAB — PAP IG, CT-NG NAA, HPV HIGH-RISK
CHLAMYDIA, NUC. ACID AMP: NEGATIVE
Gonococcus by Nucleic Acid Amp: NEGATIVE
HPV, high-risk: NEGATIVE
PAP Smear Comment: 0

## 2017-09-13 ENCOUNTER — Telehealth: Payer: Self-pay | Admitting: Family Medicine

## 2017-09-13 MED FILL — metFORMIN HCL 500 MG TABS: 500 | 30 days supply | Qty: 60 | Fill #1

## 2017-09-13 NOTE — Telephone Encounter (Signed)
Copied from [place] [id number]. Topic: Quick Communication - Rx Refill/Question >> Sep 13, 2017  9:53 AM Bea Graff, NT wrote: Medication: lisinopril (PRINIVIL,ZESTRIL) 20 MG tablet   Has the patient contacted their pharmacy? Yes.   (Agent: If no, request that the patient contact the pharmacy for the refill.) (Agent: If yes, when and what did the pharmacy advise?)  Preferred Pharmacy (with phone number or street name): [place], [place] - [address] [place] [address]. [phone] (Phone) [phone] (Fax)      Agent: Please be advised that RX refills may take up to 3 business days. We ask that you follow-up with your pharmacy.

## 2017-09-13 NOTE — Telephone Encounter (Signed)
Refill for lisinopril 20 mg tab  Prescribed by historical provider  Dr. Nolon Rod  LOV  08/23/17 NOV  11/23/17  [place], [employer]

## 2017-09-15 NOTE — Telephone Encounter (Signed)
Patient calling and states that she needs this refill today. Advised patient that we received this refill request on 09/13/17 and that refill request can take 48-72 hours to get approval. Patient reiterates that she needs this today and that she has been out of the medication since Friday. The pharmacy gave her 3 pills to last her and she took the last one today.

## 2017-09-18 ENCOUNTER — Other Ambulatory Visit: Payer: Self-pay

## 2017-09-18 MED ORDER — LISINOPRIL 20 MG PO TABS: 20.0000 mg | ORAL_TABLET | Freq: Every day | ORAL | 2 refills | Status: DC

## 2017-09-18 MED FILL — LISINOPRIL 20 MG TABLET: 20 | 90 days supply | Qty: 90 | Fill #0

## 2017-09-19 ENCOUNTER — Other Ambulatory Visit: Payer: Self-pay | Admitting: Family Medicine

## 2017-09-19 ENCOUNTER — Telehealth: Payer: Self-pay | Admitting: Family Medicine

## 2017-09-19 MED ORDER — LISINOPRIL-HYDROCHLOROTHIAZIDE 20-12.5 MG PO TABS: 1.0000 | ORAL_TABLET | Freq: Every day | ORAL | 1 refills | Status: DC

## 2017-09-19 NOTE — Telephone Encounter (Signed)
Spoke with pharmacy and medication clarified

## 2017-09-19 NOTE — Telephone Encounter (Signed)
Copied from [place] [phone]. Topic: General - Other >> Sep 19, 2017  9:37 AM Keene Breath wrote: Reason for CRM: Benjamine Mola called to speak with Dr. Nolon Rod regarding patient's Lisinopril medication,  Please call pharmacy at [phone].

## 2017-09-19 NOTE — Progress Notes (Signed)
Pt was on lisinopril-hctz 20-12.5 She reported lisinopril 63m only Spoke to pharmacy and pt last refill was for lisinopril-hctz 20-12.5 Kept her meds consistent and canceled the lisinopril 20

## 2017-09-20 NOTE — Telephone Encounter (Signed)
[facility] calling pt advise pt picked up plain lisinopril  on 8/12  So they only need Rx for  plain  hydrochlorothiazide (ZESTORETIC) 20-12.5 MG tablet  They will keep the combo on file.  [place], [place] - [address] [phone] (Phone) [phone] (Fax)

## 2017-09-20 NOTE — Telephone Encounter (Signed)
        Latonya Knight calling from [place]: States  they received the rx lisinopril-hydrochlorothiazide (ZESTORETIC) 20-12.5 MG tablet. She states that the patient will have a $0.00 copay if she has an Rx for Lisinipril 4m and the Hydroclorothiazide 12.556m not the combination of them both. Pt. picked up the lisinopril 207m/12/19. Requesting RX for the Hydroclorothiazide 12.5mg29mlease send a new RX for these medication.  [phone]) [phone])

## 2017-09-20 NOTE — Telephone Encounter (Signed)
Audrea Muscat calling from [facility] states that they received the rx lisinopril-hydrochlorothiazide (ZESTORETIC) 20-12.5 MG tablet. She states that the patient will have a $0.00 copay if she has an Rx for just the Lisinipril 33m and the Hydroclorothiazide 12.548m Not the combination of them both. Please seen a new RX for these medication.  [place] [phone]) [phone])

## 2017-09-21 ENCOUNTER — Telehealth: Payer: Self-pay | Admitting: Family Medicine

## 2017-09-21 MED FILL — LISINOPRIL-HCTZ 20-12.5 MG: 20-12.5 | 90 days supply | Qty: 90 | Fill #0

## 2017-09-21 NOTE — Telephone Encounter (Signed)
Called pt to reschedule visit on 11/23/17. Rescheduled. During call pt inquired about fixing a BP medication script. Pt was informed that her lisinopril script had already been corrected to lisinopril-hydroclorothiazide

## 2017-09-21 NOTE — Telephone Encounter (Signed)
Please advise. Dgaddy, CMA

## 2017-09-23 ENCOUNTER — Encounter: Payer: Self-pay | Admitting: Family Medicine

## 2017-09-23 MED ORDER — HYDROCHLOROTHIAZIDE 12.5 MG PO CAPS: 12.5000 mg | ORAL_CAPSULE | Freq: Every day | ORAL | 1 refills | Status: DC

## 2017-09-23 MED ORDER — LISINOPRIL 20 MG PO TABS: 20.0000 mg | ORAL_TABLET | Freq: Every day | ORAL | 1 refills | Status: DC

## 2017-09-23 NOTE — Addendum Note (Signed)
Addended by: Delia Chimes A on: 09/23/2017 02:46 PM   Modules accepted: Orders

## 2017-09-23 NOTE — Telephone Encounter (Signed)
Done. Both sent in. I will mychart message the patient to let her know.

## 2017-09-25 MED FILL — HYDROCHLOROTHIAZIDE 12.5 MG: 12.5 | 90 days supply | Qty: 90 | Fill #0

## 2017-11-09 ENCOUNTER — Ambulatory Visit: Payer: No Typology Code available for payment source | Admitting: Family Medicine

## 2017-11-23 ENCOUNTER — Ambulatory Visit: Payer: No Typology Code available for payment source | Admitting: Family Medicine

## 2017-11-27 ENCOUNTER — Other Ambulatory Visit: Payer: Self-pay

## 2017-11-27 ENCOUNTER — Ambulatory Visit (INDEPENDENT_AMBULATORY_CARE_PROVIDER_SITE_OTHER): Payer: No Typology Code available for payment source | Admitting: Family Medicine

## 2017-11-27 ENCOUNTER — Encounter: Payer: Self-pay | Admitting: Family Medicine

## 2017-11-27 VITALS — BP 116/81 | HR 92 | Temp 98.7°F | Resp 16 | Ht 65.75 in | Wt 187.4 lb

## 2017-11-27 DIAGNOSIS — R790 Abnormal level of blood mineral: Secondary | ICD-10-CM

## 2017-11-27 DIAGNOSIS — E119 Type 2 diabetes mellitus without complications: Secondary | ICD-10-CM

## 2017-11-27 DIAGNOSIS — E1169 Type 2 diabetes mellitus with other specified complication: Secondary | ICD-10-CM | POA: Diagnosis not present

## 2017-11-27 DIAGNOSIS — L853 Xerosis cutis: Secondary | ICD-10-CM

## 2017-11-27 DIAGNOSIS — F17209 Nicotine dependence, unspecified, with unspecified nicotine-induced disorders: Secondary | ICD-10-CM

## 2017-11-27 LAB — GLUCOSE, POCT (MANUAL RESULT ENTRY): POC Glucose: 102 mg/dl — AB (ref 70–99)

## 2017-11-27 MED ORDER — LORATADINE 10 MG PO TABS: 10.0000 mg | ORAL_TABLET | Freq: Every day | ORAL | 11 refills | Status: DC

## 2017-11-27 MED ORDER — LISINOPRIL-HYDROCHLOROTHIAZIDE 20-12.5 MG PO TABS: 1.0000 | ORAL_TABLET | Freq: Every day | ORAL | 1 refills | Status: DC

## 2017-11-27 MED ORDER — HYDROCORTISONE 2.5 % EX CREA: TOPICAL_CREAM | Freq: Two times a day (BID) | CUTANEOUS | 1 refills | Status: DC

## 2017-11-27 MED ORDER — VARENICLINE TARTRATE 0.5 MG X 11 & 1 MG X 42 PO MISC: ORAL | 0 refills | Status: DC

## 2017-11-27 MED FILL — HYDROCORTISONE 2.5% CREAM: 2.5 | 14 days supply | Qty: 30 | Fill #0

## 2017-11-27 MED FILL — LISINOPRIL-HCTZ 20-12.5 MG: 20-12.5 | 90 days supply | Qty: 90 | Fill #1

## 2017-11-27 NOTE — Progress Notes (Signed)
Chief Complaint  Patient presents with  . recheck iron levels/ pt would like blood sugar checked as we    HPI   Diabetes Mellitus She reports that she has been diabetic since 07/22/2013 when her a1c 6.7% She reports that she has been able to remain off metformin She stopped metformin 09/2017 She denies polyuria and polyphagia but reports polydipsia She reports that she has always been a thirst individual. Lab Results  Component Value Date   HGBA1C 5.5 06/26/2017   Anemia She takes OTC iron supplement once a day She reports that she is also eating iron rich foods She reports fatigue and cold intolerance She denies worsening heart palpitations She denies blood into her stools Her BM are dark green or black depending on her iron intake Feels like something is crawling under the skin and it is all over and not just the legs Lab Results  Component Value Date   WBC 6.1 06/26/2017   HGB 11.0 (L) 06/26/2017   HCT 33.8 (L) 06/26/2017   MCV 87 06/26/2017   PLT 413 06/26/2017   Rash She reports that she has a 2 week history of rash from the buttocks and crept up to the thigh and the low back  No new detergents, soaps of lotions  Smoking cessation Pt reports that she has been smoking for 16 yrs She smokes a pack a day She has an interest to quit and would like help Her father was able to quit with chantix She reports that she does not have any nightime or morning cough She smokes in the car and after meals and due to stress  Past Medical History:  Diagnosis Date  . Hypertension     Current Outpatient Medications  Medication Sig Dispense Refill  . aspirin 325 MG tablet Take 325 mg by mouth daily.    Marland Kitchen lisinopril-hydrochlorothiazide (ZESTORETIC) 20-12.5 MG tablet Take 1 tablet by mouth daily. 90 tablet 1  . hydrocortisone 2.5 % cream Apply topically 2 (two) times daily. 30 g 1  . loratadine (CLARITIN) 10 MG tablet Take 1 tablet (10 mg total) by mouth daily. 30 tablet 11  .  varenicline (CHANTIX STARTING MONTH PAK) 0.5 MG X 11 & 1 MG X 42 tablet Take one 0.5 mg tablet by mouth once daily for 3 days, then increase to one 0.5 mg tablet twice daily for 4 days, then increase to one 1 mg tablet twice daily. 53 tablet 0   No current facility-administered medications for this visit.     Allergies: No Known Allergies  Past Surgical History:  Procedure Laterality Date  . ANKLE FRACTURE SURGERY      Social History   Socioeconomic History  . Marital status: Single    Spouse name: Not on file  . Number of children: Not on file  . Years of education: Not on file  . Highest education level: Not on file  Occupational History  . Not on file  Social Needs  . Financial resource strain: Not on file  . Food insecurity:    Worry: Not on file    Inability: Not on file  . Transportation needs:    Medical: Not on file    Non-medical: Not on file  Tobacco Use  . Smoking status: Current Every Day Smoker    Packs/day: 0.50    Types: Cigarettes  . Smokeless tobacco: Never Used  Substance and Sexual Activity  . Alcohol use: No  . Drug use: No  . Sexual activity: Yes  Lifestyle  . Physical activity:    Days per week: Not on file    Minutes per session: Not on file  . Stress: Not on file  Relationships  . Social connections:    Talks on phone: Not on file    Gets together: Not on file    Attends religious service: Not on file    Active member of club or organization: Not on file    Attends meetings of clubs or organizations: Not on file    Relationship status: Not on file  Other Topics Concern  . Not on file  Social History Narrative  . Not on file    Family History  Problem Relation Age of Onset  . Cancer Paternal Aunt        pancreatic  . Cancer Maternal Grandmother        lung     ROS Review of Systems See HPI Constitution: No fevers or chills No malaise No diaphoresis Skin: see hpi Eyes: no blurry vision, no double vision GU: no dysuria or  hematuria Neuro: no dizziness or headaches all others reviewed and negative   Objective: Vitals:   11/27/17 0837  BP: 116/81  Pulse: 92  Resp: 16  Temp: 98.7 F (37.1 C)  TempSrc: Oral  SpO2: 100%  Weight: 187 lb 6.4 oz (85 kg)  Height: 5' 5.75" (1.67 m)    Physical Exam Physical Exam  Constitutional: She is oriented to person, place, and time. She appears well-developed and well-nourished.  HENT:  Head: Normocephalic and atraumatic.  Eyes: Conjunctivae and EOM are normal.  Cardiovascular: Normal rate, regular rhythm and normal heart sounds.   Pulmonary/Chest: Effort normal and breath sounds normal. No respiratory distress. She has no wheezes.  Skin: no visible commodones, no visible blisters, +excoritation noted on buttocks and low back  Neurological: She is alert and oriented to person, place, and time.    Assessment and Plan Shawniece was seen today for recheck iron levels/ pt would like blood sugar checked as we.  Diagnoses and all orders for this visit:  Abnormal blood level of iron- discussed iron goals Discussed common side effects of iron supplements and common manifestations of low iron  -     Iron, TIBC and Ferritin Panel -     CBC  Type 2 diabetes mellitus with other specified complication, without long-term current use of insulin (HCC) -     Comprehensive metabolic panel -     POCT glucose (manual entry)  Diet-controlled type 2 diabetes mellitus ([place])- continue ADA diet -     Hemoglobin A1c  Dry skin dermatitis- hydrocortisone and claritin  Tobacco use disorder, continuous- discussed common side effects of chantix Expected course Smoking cessation tips for success Patient is in action stage -     varenicline (CHANTIX STARTING MONTH PAK) 0.5 MG X 11 & 1 MG X 42 tablet; Take one 0.5 mg tablet by mouth once daily for 3 days, then increase to one 0.5 mg tablet twice daily for 4 days, then increase to one 1 mg tablet twice daily.  Other orders -      lisinopril-hydrochlorothiazide (ZESTORETIC) 20-12.5 MG tablet; Take 1 tablet by mouth daily. -     loratadine (CLARITIN) 10 MG tablet; Take 1 tablet (10 mg total) by mouth daily. -     hydrocortisone 2.5 % cream; Apply topically 2 (two) times daily.  A total of 40 minutes were spent face-to-face with the patient during this encounter and over half of  that time was spent on counseling and coordination of care.    [place]

## 2017-11-27 NOTE — Patient Instructions (Addendum)
Chantix - common side effects bizarre dreams, worsen depression, changes in appetite, insomnia, and mood swings      If you have lab work done today you will be contacted with your lab results within the next 2 weeks.  If you have not heard from [place] then please contact us. The fastest way to get your results is to register for My Chart.   IF you received an x-ray today, you will receive an invoice from [facility]. Please contact [facility] at [phone] with questions or concerns regarding your invoice.   IF you received labwork today, you will receive an invoice from [facility]. Please contact [facility] at [phone] with questions or concerns regarding your invoice.   Our billing staff will not be able to assist you with questions regarding bills from these companies.  You will be contacted with the lab results as soon as they are available. The fastest way to get your results is to activate your My Chart account. Instructions are located on the last page of this paperwork. If you have not heard from [place] regarding the results in 2 weeks, please contact this office.     Smoking Tobacco Information Smoking tobacco will very likely harm your health. Tobacco contains a poisonous (toxic), colorless chemical called nicotine. Nicotine affects the brain and makes tobacco addictive. This change in your brain can make it hard to stop smoking. Tobacco also has other toxic chemicals that can hurt your body and raise your risk of many cancers. How can smoking tobacco affect me? Smoking tobacco can increase your chances of having serious health conditions, such as:  Cancer. Smoking is most commonly associated with lung cancer, but can lead to cancer in other parts of the body.  Chronic obstructive pulmonary disease (COPD). This is a long-term lung condition that makes it hard to breathe. It also gets worse over time.  High blood pressure (hypertension), heart disease, stroke, or  heart attack.  Lung infections, such as pneumonia.  Cataracts. This is when the lenses in the eyes become clouded.  Digestive problems. This may include peptic ulcers, heartburn, and gastroesophageal reflux disease (GERD).  Oral health problems, such as gum disease and tooth loss.  Loss of taste and smell.  Smoking can affect your appearance by causing:  Wrinkles.  Yellow or stained teeth, fingers, and fingernails.  Smoking tobacco can also affect your social life.  Many workplaces, [employer], hotels, and public places are tobacco-free. This means that you may experience challenges in finding places to smoke when away from home.  The cost of a smoking habit can be expensive. Expenses for someone who smokes come in two ways: ? You spend money on a regular basis to buy tobacco. ? Your health care costs in the long-term are higher if you smoke.  Tobacco smoke can also affect the health of those around you. Children of smokers have greater chances of: ? Sudden infant death syndrome (SIDS). ? Ear infections. ? Lung infections.  What lifestyle changes can be made?  Do not start smoking. Quit if you already do.  To quit smoking: ? Make a plan to quit smoking and commit yourself to it. Look for programs to help you and ask your health care provider for recommendations and ideas. ? Talk with your health care provider about using nicotine replacement medicines to help you quit. Medicine replacement medicines include gum, lozenges, patches, sprays, or pills. ? Do not replace cigarette smoking with electronic cigarettes, which are commonly called e-cigarettes. The  safety of e-cigarettes is not known, and some may contain harmful chemicals. ? Avoid places, people, or situations that tempt you to smoke. ? If you try to quit but return to smoking, don't give up hope. It is very common for people to try a number of times before they fully succeed. When you feel ready again, give it another  try.  Quitting smoking might affect the way you eat as well as your weight. Be prepared to monitor your eating habits. Get support in planning and following a healthy diet.  Ask your health care provider about having regular tests (screenings) to check for cancer. This may include blood tests, imaging tests, and other tests.  Exercise regularly. Consider taking walks, joining a gym, or doing yoga or exercise classes.  Develop skills to manage your stress. These skills include meditation. What are the benefits of quitting smoking? By quitting smoking, you may:  Lower your risk of getting cancer and other diseases caused by smoking.  Live longer.  Breathe better.  Lower your blood pressure and heart rate.  Stop your addiction to tobacco.  Stop creating secondhand smoke that hurts other people.  Improve your sense of taste and smell.  Look better over time, due to having fewer wrinkles and less staining.  What can happen if changes are not made? If you do not stop smoking, you may:  Get cancer and other diseases.  Develop COPD or other long-term (chronic) lung conditions.  Develop serious problems with your heart and blood vessels (cardiovascular system).  Need more tests to screen for problems caused by smoking.  Have higher, long-term healthcare costs from medicines or treatments related to smoking.  Continue to have worsening changes in your lungs, mouth, and nose.  Where to find support: To get support to quit smoking, consider:  Asking your health care provider for more information and resources.  Taking classes to learn more about quitting smoking.  Looking for local organizations that offer resources about quitting smoking.  Joining a support group for people who want to quit smoking in your local community.  Where to find more information: You may find more information about quitting smoking from:  HelpGuide.org:  www.helpguide.org/articles/addictions/how-to-quit-smoking.htm  https://hall.com/: smokefree.gov  American Lung Association: www.lung.org  Contact a health care provider if:  You have problems breathing.  Your lips, nose, or fingers turn blue.  You have chest pain.  You are coughing up blood.  You feel faint or you pass out.  You have other noticeable changes that cause you to worry. Summary  Smoking tobacco can negatively affect your health, the health of those around you, your finances, and your social life.  Do not start smoking. Quit if you already do. If you need help quitting, ask your health care provider.  Think about joining a support group for people who want to quit smoking in your local community. There are many effective programs that will help you to quit this behavior. This information is not intended to replace advice given to you by your health care provider. Make sure you discuss any questions you have with your health care provider. Document Released: 02/09/2016 Document Revised: 02/09/2016 Document Reviewed: 02/09/2016 Elsevier Interactive Patient Education  [employer].

## 2017-11-28 LAB — COMPREHENSIVE METABOLIC PANEL
ALT: 34 IU/L — AB (ref 0–32)
AST: 53 IU/L — ABNORMAL HIGH (ref 0–40)
Albumin/Globulin Ratio: 1.4 (ref 1.2–2.2)
Albumin: 4.3 g/dL (ref 3.5–5.5)
Alkaline Phosphatase: 72 IU/L (ref 39–117)
BILIRUBIN TOTAL: 0.3 mg/dL (ref 0.0–1.2)
BUN/Creatinine Ratio: 14 (ref 9–23)
BUN: 12 mg/dL (ref 6–24)
CHLORIDE: 102 mmol/L (ref 96–106)
CO2: 20 mmol/L (ref 20–29)
Calcium: 9.6 mg/dL (ref 8.7–10.2)
Creatinine, Ser: 0.87 mg/dL (ref 0.57–1.00)
GFR calc Af Amer: 95 mL/min/{1.73_m2} (ref >59)
GFR calc non Af Amer: 82 mL/min/{1.73_m2} (ref >59)
GLUCOSE: 103 mg/dL — AB (ref 65–99)
Globulin, Total: 3.1 g/dL (ref 1.5–4.5)
Potassium: 4.5 mmol/L (ref 3.5–5.2)
Sodium: 139 mmol/L (ref 134–144)
Total Protein: 7.4 g/dL (ref 6.0–8.5)

## 2017-11-28 LAB — IRON,TIBC AND FERRITIN PANEL
Ferritin: 96 ng/mL (ref 15–150)
IRON SATURATION: 29 % (ref 15–55)
Iron: 111 ug/dL (ref 27–159)
Total Iron Binding Capacity: 387 ug/dL (ref 250–450)
UIBC: 276 ug/dL (ref 131–425)

## 2017-11-28 LAB — CBC
HEMATOCRIT: 37.3 % (ref 34.0–46.6)
Hemoglobin: 11.9 g/dL (ref 11.1–15.9)
MCH: 29.5 pg (ref 26.6–33.0)
MCHC: 31.9 g/dL (ref 31.5–35.7)
MCV: 93 fL (ref 79–97)
Platelets: 431 10*3/uL (ref 150–450)
RBC: 4.03 x10E6/uL (ref 3.77–5.28)
RDW: 13.6 % (ref 12.3–15.4)
WBC: 4.9 10*3/uL (ref 3.4–10.8)

## 2017-11-28 LAB — HEMOGLOBIN A1C
Est. average glucose Bld gHb Est-mCnc: 105 mg/dL
Hgb A1c MFr Bld: 5.3 % (ref 4.8–5.6)

## 2017-11-28 MED FILL — CHANTIX STARTING MONTH BOX: 0.5 MG X 11 | 28 days supply | Qty: 53 | Fill #0

## 2018-03-01 ENCOUNTER — Ambulatory Visit: Payer: No Typology Code available for payment source | Admitting: Family Medicine

## 2018-03-02 ENCOUNTER — Ambulatory Visit: Payer: No Typology Code available for payment source | Admitting: Family Medicine

## 2018-03-03 ENCOUNTER — Encounter: Payer: Self-pay | Admitting: Family Medicine

## 2018-03-03 ENCOUNTER — Ambulatory Visit (INDEPENDENT_AMBULATORY_CARE_PROVIDER_SITE_OTHER): Payer: No Typology Code available for payment source | Admitting: Family Medicine

## 2018-03-03 ENCOUNTER — Other Ambulatory Visit: Payer: Self-pay

## 2018-03-03 VITALS — BP 136/87 | HR 95 | Temp 99.1°F | Ht 65.0 in | Wt 192.0 lb

## 2018-03-03 DIAGNOSIS — R002 Palpitations: Secondary | ICD-10-CM

## 2018-03-03 DIAGNOSIS — R0600 Dyspnea, unspecified: Secondary | ICD-10-CM

## 2018-03-03 DIAGNOSIS — F419 Anxiety disorder, unspecified: Secondary | ICD-10-CM | POA: Diagnosis not present

## 2018-03-03 DIAGNOSIS — Z1239 Encounter for other screening for malignant neoplasm of breast: Secondary | ICD-10-CM

## 2018-03-03 DIAGNOSIS — Z09 Encounter for follow-up examination after completed treatment for conditions other than malignant neoplasm: Secondary | ICD-10-CM

## 2018-03-03 DIAGNOSIS — I499 Cardiac arrhythmia, unspecified: Secondary | ICD-10-CM | POA: Diagnosis not present

## 2018-03-03 DIAGNOSIS — F17200 Nicotine dependence, unspecified, uncomplicated: Secondary | ICD-10-CM

## 2018-03-03 NOTE — Progress Notes (Signed)
Established Patient Office Visit  Subjective:  Patient ID: Kaylee Maxwell, female    DOB: Oct 16, 1975  Age: 43 y.o. MRN: [id number]  CC:  Chief Complaint  Patient presents with  . heart flutter    going on for years but more intense in the last year  . Referral    therapist and cards    HPI Kaylee Maxwell is a 43 year old female who presents for follow up to initiate smoking cessation today.   Past Medical History:  Diagnosis Date  . Hypertension    Current Status: Since her last office visit, she is doing fairly well. She quit smoking 5 weeks ago. She states that she vapes occasionally when she has a strong craving to smoke cigarettes. She states that she has been noticing more frequent heart palpitations lately. She has not had any headaches, visual changes, dizziness, and falls. No chest pain, and cough. These are intermittent and come at randomly. The longest are about 30 seconds. She was planned for Holter monitoring 06/2017, which she did not schedule at that time. Denies severe headaches, confusion, seizures, double vision, and blurred vision, nausea and vomiting. Denies sudden onset of dyspnea and coughing. Denies Productive frothy, pink-tinged sputum. Denies pallor, and feelings of impending doom. Her anxiety is moderate today. She denies suicidal ideations, homicidal ideations, or auditory hallucinations.  She denies fevers, chills, fatigue, recent infections, weight loss, and night sweats.  No reports of GI problems such as diarrhea, and constipation. She has no reports of blood in stools, dysuria and hematuria. She denies pain today.   Past Surgical History:  Procedure Laterality Date  . ANKLE FRACTURE SURGERY      Family History  Problem Relation Age of Onset  . Cancer Paternal Aunt        pancreatic  . Cancer Maternal Grandmother        lung    Social History   Socioeconomic History  . Marital status: Single    Spouse name: Not on file  . Number of  children: Not on file  . Years of education: Not on file  . Highest education level: Not on file  Occupational History  . Not on file  Social Needs  . Financial resource strain: Not on file  . Food insecurity:    Worry: Not on file    Inability: Not on file  . Transportation needs:    Medical: Not on file    Non-medical: Not on file  Tobacco Use  . Smoking status: Current Every Day Smoker    Packs/day: 0.50    Types: Cigarettes  . Smokeless tobacco: Never Used  Substance and Sexual Activity  . Alcohol use: No  . Drug use: No  . Sexual activity: Yes  Lifestyle  . Physical activity:    Days per week: Not on file    Minutes per session: Not on file  . Stress: Not on file  Relationships  . Social connections:    Talks on phone: Not on file    Gets together: Not on file    Attends religious service: Not on file    Active member of club or organization: Not on file    Attends meetings of clubs or organizations: Not on file    Relationship status: Not on file  . Intimate partner violence:    Fear of current or ex partner: Not on file    Emotionally abused: Not on file    Physically abused: Not on file  Forced sexual activity: Not on file  Other Topics Concern  . Not on file  Social History Narrative  . Not on file    Outpatient Medications Prior to Visit  Medication Sig Dispense Refill  . aspirin 325 MG tablet Take 325 mg by mouth daily.    . ferrous sulfate 325 (65 FE) MG EC tablet Take 325 mg by mouth daily with breakfast.    . hydrocortisone 2.5 % cream Apply topically 2 (two) times daily. 30 g 1  . lisinopril-hydrochlorothiazide (ZESTORETIC) 20-12.5 MG tablet Take 1 tablet by mouth daily. 90 tablet 1  . loratadine (CLARITIN) 10 MG tablet Take 1 tablet (10 mg total) by mouth daily. 30 tablet 11  . varenicline (CHANTIX STARTING MONTH PAK) 0.5 MG X 11 & 1 MG X 42 tablet Take one 0.5 mg tablet by mouth once daily for 3 days, then increase to one 0.5 mg tablet twice  daily for 4 days, then increase to one 1 mg tablet twice daily. 53 tablet 0   No facility-administered medications prior to visit.     No Known Allergies  ROS Review of Systems  Constitutional: Negative.   HENT: Negative.   Eyes: Negative.   Respiratory: Positive for shortness of breath (Occasional).   Cardiovascular: Positive for palpitations (intermittent).  Gastrointestinal: Negative.   Endocrine: Negative.   Genitourinary: Negative.   Musculoskeletal: Negative.   Skin: Negative.   Allergic/Immunologic: Negative.   Neurological: Positive for dizziness and headaches.  Hematological: Negative.   Psychiatric/Behavioral: Negative.    Objective:    Physical Exam  Constitutional: She is oriented to person, place, and time. She appears well-developed and well-nourished.  Eyes: Conjunctivae are normal.  Neck: Normal range of motion. Neck supple.  Cardiovascular: Normal rate and intact distal pulses.  Irregular heartbeat  Pulmonary/Chest: Effort normal and breath sounds normal.  Abdominal: Soft. Bowel sounds are normal.  Musculoskeletal: Normal range of motion.  Neurological: She is alert and oriented to person, place, and time.  Skin: Skin is warm and dry.  Psychiatric: She has a normal mood and affect. Her behavior is normal. Judgment and thought content normal.  Vitals reviewed.  BP 136/87 (BP Location: Right Arm, Patient Position: Sitting, Cuff Size: Normal)   Pulse 95   Temp 99.1 F (37.3 C) (Oral)   Ht 5' 5"  (1.651 m)   Wt 192 lb (87.1 kg)   LMP 02/05/2018   SpO2 100%   BMI 31.95 kg/m  Wt Readings from Last 3 Encounters:  03/03/18 192 lb (87.1 kg)  11/27/17 187 lb 6.4 oz (85 kg)  08/23/17 194 lb 12.8 oz (88.4 kg)     Health Maintenance Due  Topic Date Due  . HIV Screening  02/23/1990    There are no preventive care reminders to display for this patient.  Lab Results  Component Value Date   TSH 1.920 06/26/2017   Lab Results  Component Value Date    WBC 4.9 11/27/2017   HGB 11.9 11/27/2017   HCT 37.3 11/27/2017   MCV 93 11/27/2017   PLT 431 11/27/2017   Lab Results  Component Value Date   NA 139 11/27/2017   K 4.5 11/27/2017   CO2 20 11/27/2017   GLUCOSE 103 (H) 11/27/2017   BUN 12 11/27/2017   CREATININE 0.87 11/27/2017   BILITOT 0.3 11/27/2017   ALKPHOS 72 11/27/2017   AST 53 (H) 11/27/2017   ALT 34 (H) 11/27/2017   PROT 7.4 11/27/2017   ALBUMIN 4.3 11/27/2017  CALCIUM 9.6 11/27/2017   No results found for: CHOL No results found for: HDL No results found for: LDLCALC No results found for: TRIG No results found for: [facility] Lab Results  Component Value Date   HGBA1C 5.3 11/27/2017   Assessment & Plan:   1. Heart palpitations Occasional, intermittent, but noticed more frequently. Thyroid level in 06/2017 for Hyperthyroidism and she was found to be mildly anemic with Hgb at 11.1. Referral to Cardiology for further evaluation and possible Holter Monitoring.  - EKG 12-Lead - Ambulatory referral to Cardiology  2. Irregular heartbeat ECG revealed Sinus Rhythm with frequent ectopic ventricular beats.  - EKG 12-Lead - Ambulatory referral to Cardiology  3. Dyspnea, unspecified type Occasional shortness of breath. No signs or symptoms of respiratory distress noted. Monitor.  - EKG 12-Lead - Ambulatory referral to Cardiology  4. Anxiety Increasing situational/family stressors. She does not want to try anti-anxiety medications at this time, but would like to be referred to a counselor.  - Ambulatory referral to Psychiatry  5. Breast cancer screening Last Mammogram was 2017. She will schedule at Imaging office.  - MM Digital Diagnostic Bilat; Future  6. Tobacco use disorder Resolved. She no longer smokes cigarettes.   7. Follow up She will follow up in 3 months.   No orders of the defined types were placed in this encounter.  Kathe Becton,  MSN, FNP-C [facility] [address] [place], [place] [address] [phone]   Referral Orders     Ambulatory referral to Cardiology     Ambulatory referral to Psychiatry Problem List Items Addressed This Visit    None    Visit Diagnoses    Heart palpitations    -  Primary   Relevant Orders   EKG 12-Lead (Completed)   Ambulatory referral to Cardiology   Irregular heartbeat       Relevant Orders   EKG 12-Lead (Completed)   Ambulatory referral to Cardiology   Dyspnea, unspecified type       Relevant Orders   EKG 12-Lead (Completed)   Ambulatory referral to Cardiology   Anxiety       Relevant Orders   Ambulatory referral to Psychiatry   Breast cancer screening       Relevant Orders   MM Digital Diagnostic Bilat   Tobacco use disorder       Follow up          No orders of the defined types were placed in this encounter.   Follow-up: Return in about 3 months (around 06/02/2018).    Azzie Glatter, FNP

## 2018-03-03 NOTE — Patient Instructions (Addendum)
If you have lab work done today you will be contacted with your lab results within the next 2 weeks.  If you have not heard from [place] then please contact us. The fastest way to get your results is to register for My Chart.   IF you received an x-ray today, you will receive an invoice from [facility]. Please contact [facility] at [phone] with questions or concerns regarding your invoice.   IF you received labwork today, you will receive an invoice from [facility]. Please contact [facility] at [phone] with questions or concerns regarding your invoice.   Our billing staff will not be able to assist you with questions regarding bills from these companies.  You will be contacted with the lab results as soon as they are available. The fastest way to get your results is to activate your My Chart account. Instructions are located on the last page of this paperwork. If you have not heard from [place] regarding the results in 2 weeks, please contact this office.      Generalized Anxiety Disorder, Adult Generalized anxiety disorder (GAD) is a mental health disorder. People with this condition constantly worry about everyday events. Unlike normal anxiety, worry related to GAD is not triggered by a specific event. These worries also do not fade or get better with time. GAD interferes with life functions, including relationships, work, and school. GAD can vary from mild to severe. People with severe GAD can have intense waves of anxiety with physical symptoms (panic attacks). What are the causes? The exact cause of GAD is not known. What increases the risk? This condition is more likely to develop in:  Women.  People who have a family history of anxiety disorders.  People who are very shy.  People who experience very stressful life events, such as the death of a loved one.  People who have a very stressful family environment. What are the signs or symptoms? People with  GAD often worry excessively about many things in their lives, such as their health and family. They may also be overly concerned about:  Doing well at work.  Being on time.  Natural disasters.  Friendships. Physical symptoms of GAD include:  Fatigue.  Muscle tension or having muscle twitches.  Trembling or feeling shaky.  Being easily startled.  Feeling like your heart is pounding or racing.  Feeling out of breath or like you cannot take a deep breath.  Having trouble falling asleep or staying asleep.  Sweating.  Nausea, diarrhea, or irritable bowel syndrome (IBS).  Headaches.  Trouble concentrating or remembering facts.  Restlessness.  Irritability. How is this diagnosed? Your health care provider can diagnose GAD based on your symptoms and medical history. You will also have a physical exam. The health care provider will ask specific questions about your symptoms, including how severe they are, when they started, and if they come and go. Your health care provider may ask you about your use of alcohol or drugs, including prescription medicines. Your health care provider may refer you to a mental health specialist for further evaluation. Your health care provider will do a thorough examination and may perform additional tests to rule out other possible causes of your symptoms. To be diagnosed with GAD, a person must have anxiety that:  Is out of his or her control.  Affects several different aspects of his or her life, such as work and relationships.  Causes distress that makes him or her unable to take  part in normal activities.  Includes at least three physical symptoms of GAD, such as restlessness, fatigue, trouble concentrating, irritability, muscle tension, or sleep problems. Before your health care provider can confirm a diagnosis of GAD, these symptoms must be present more days than they are not, and they must last for six months or longer. How is this  treated? The following therapies are usually used to treat GAD:  Medicine. Antidepressant medicine is usually prescribed for long-term daily control. Antianxiety medicines may be added in severe cases, especially when panic attacks occur.  Talk therapy (psychotherapy). Certain types of talk therapy can be helpful in treating GAD by providing support, education, and guidance. Options include: ? Cognitive behavioral therapy (CBT). People learn coping skills and techniques to ease their anxiety. They learn to identify unrealistic or negative thoughts and behaviors and to replace them with positive ones. ? Acceptance and commitment therapy (ACT). This treatment teaches people how to be mindful as a way to cope with unwanted thoughts and feelings. ? Biofeedback. This process trains you to manage your body's response (physiological response) through breathing techniques and relaxation methods. You will work with a therapist while machines are used to monitor your physical symptoms.  Stress management techniques. These include yoga, meditation, and exercise. A mental health specialist can help determine which treatment is best for you. Some people see improvement with one type of therapy. However, other people require a combination of therapies. Follow these instructions at home:  Take over-the-counter and prescription medicines only as told by your health care provider.  Try to maintain a normal routine.  Try to anticipate stressful situations and allow extra time to manage them.  Practice any stress management or self-calming techniques as taught by your health care provider.  Do not punish yourself for setbacks or for not making progress.  Try to recognize your accomplishments, even if they are small.  Keep all follow-up visits as told by your health care provider. This is important. Contact a health care provider if:  Your symptoms do not get better.  Your symptoms get worse.  You have  signs of depression, such as: ? A persistently sad, cranky, or irritable mood. ? Loss of enjoyment in activities that used to bring you joy. ? Change in weight or eating. ? Changes in sleeping habits. ? Avoiding friends or family members. ? Loss of energy for normal tasks. ? Feelings of guilt or worthlessness. Get help right away if:  You have serious thoughts about hurting yourself or others. If you ever feel like you may hurt yourself or others, or have thoughts about taking your own life, get help right away. You can go to your nearest emergency department or call:  Your local emergency services (911 in the [place]  A suicide crisis helpline, such as the [place] at 217 128 8479. This is open 24 hours a day. Summary  Generalized anxiety disorder (GAD) is a mental health disorder that involves worry that is not triggered by a specific event.  People with GAD often worry excessively about many things in their lives, such as their health and family.  GAD may cause physical symptoms such as restlessness, trouble concentrating, sleep problems, frequent sweating, nausea, diarrhea, headaches, and trembling or muscle twitching.  A mental health specialist can help determine which treatment is best for you. Some people see improvement with one type of therapy. However, other people require a combination of therapies. This information is not intended to replace advice given to you  by your health care provider. Make sure you discuss any questions you have with your health care provider. Document Released: 05/21/2012 Document Revised: 12/15/2015 Document Reviewed: 12/15/2015 Elsevier Interactive Patient Education  2019 [employer].    Palpitations Palpitations are feelings that your heartbeat is not normal. Your heartbeat may feel like it is:  Uneven.  Faster than normal.  Fluttering.  Skipping a beat. This is usually not a serious problem. In some  cases, you may need tests to rule out any serious problems. Follow these instructions at home: Pay attention to any changes in your condition. Take these actions to help manage your symptoms: Eating and drinking  Avoid: ? Coffee, tea, soft drinks, and energy drinks. ? Chocolate. ? Alcohol. ? Diet pills. Lifestyle   Try to lower your stress. These things can help you relax: ? Yoga. ? Deep breathing and meditation. ? Exercise. ? Using words and images to create positive thoughts (guided imagery). ? Using your mind to control things in your body (biofeedback).  Do not use drugs.  Get plenty of rest and sleep. Keep a regular bed time. General instructions   Take over-the-counter and prescription medicines only as told by your doctor.  Do not use any products that contain nicotine or tobacco, such as cigarettes and e-cigarettes. If you need help quitting, ask your doctor.  Keep all follow-up visits as told by your doctor. This is important. You may need more tests if palpitations do not go away or get worse. Contact a doctor if:  Your symptoms last more than 24 hours.  Your symptoms occur more often. Get help right away if you:  Have chest pain.  Feel short of breath.  Have a very bad headache.  Feel dizzy.  Pass out (faint). Summary  Palpitations are feelings that your heartbeat is uneven or faster than normal. It may feel like your heart is fluttering or skipping a beat.  Avoid food and drinks that may cause palpitations. These include caffeine, chocolate, and alcohol.  Try to lower your stress. Do not smoke or use drugs.  Get help right away if you faint or have chest pain, shortness of breath, a severe headache, or dizziness. This information is not intended to replace advice given to you by your health care provider. Make sure you discuss any questions you have with your health care provider. Document Released: 11/03/2007 Document Revised: 03/08/2017 Document  Reviewed: 03/08/2017 Elsevier Interactive Patient Education  2019 [employer].

## 2018-03-04 DIAGNOSIS — F419 Anxiety disorder, unspecified: Secondary | ICD-10-CM

## 2018-03-04 DIAGNOSIS — I499 Cardiac arrhythmia, unspecified: Secondary | ICD-10-CM

## 2018-03-04 DIAGNOSIS — Z1239 Encounter for other screening for malignant neoplasm of breast: Secondary | ICD-10-CM | POA: Insufficient documentation

## 2018-03-04 DIAGNOSIS — R002 Palpitations: Secondary | ICD-10-CM | POA: Insufficient documentation

## 2018-03-04 HISTORY — DX: Palpitations: R00.2

## 2018-03-04 HISTORY — DX: Anxiety disorder, unspecified: F41.9

## 2018-03-04 HISTORY — DX: Cardiac arrhythmia, unspecified: I49.9

## 2018-03-04 HISTORY — DX: Encounter for other screening for malignant neoplasm of breast: Z12.39

## 2018-03-19 ENCOUNTER — Encounter: Payer: Self-pay | Admitting: Cardiovascular Disease

## 2018-03-19 ENCOUNTER — Ambulatory Visit: Payer: No Typology Code available for payment source | Admitting: Cardiovascular Disease

## 2018-03-19 VITALS — BP 120/74 | HR 76 | Ht 65.0 in | Wt 193.1 lb

## 2018-03-19 DIAGNOSIS — I10 Essential (primary) hypertension: Secondary | ICD-10-CM

## 2018-03-19 DIAGNOSIS — I493 Ventricular premature depolarization: Secondary | ICD-10-CM

## 2018-03-19 MED ORDER — LISINOPRIL-HYDROCHLOROTHIAZIDE 20-12.5 MG PO TABS: 1.0000 | ORAL_TABLET | Freq: Every day | ORAL | 3 refills | Status: DC

## 2018-03-19 MED ORDER — ASPIRIN EC 81 MG PO TBEC: 81.0000 mg | DELAYED_RELEASE_TABLET | Freq: Every day | ORAL | Status: DC

## 2018-03-19 MED FILL — LISINOPRIL-HCTZ 20-12.5 MG: 20-12.5 | 90 days supply | Qty: 90 | Fill #0

## 2018-03-19 NOTE — Progress Notes (Signed)
Cardiology Office Note:    Date:  03/19/2018   ID:  Kaylee Maxwell, DOB 15-Dec-1975, MRN [id number]  PCP:  Forrest Moron, MD  Cardiologist:  No primary care provider on file.  Electrophysiologist:  None   Referring MD: Azzie Glatter, FNP   Chief Complaint  Patient presents with  . Palpitations     Feb. 10, 2020   Kaylee Maxwell is a 43 y.o. female with a hx of palpitations for years.  We are asked by Forrest Moron, MD to see her for  worsening palpitations .   Has single isolated palpitations.  These occur sporadically.  There is no particular time that she has them worse.  The episodes are not related to eating or drinking.  They are not related to change in position or exercise. The palpitations seem to be worse after she drinks coffee.  She does not get any regular exercise.  She works in the [employer]	[place] for [employer]  She stopped smoking this past December.  She eats an unrestricted diet.  Past Medical History:  Diagnosis Date  . Anxiety 03/04/2018  . Breast cancer screening 03/04/2018  . Heart palpitations 03/04/2018  . Hypertension   . Irregular heartbeat 03/04/2018  . Scalp mass 10/09/2012    Past Surgical History:  Procedure Laterality Date  . ANKLE FRACTURE SURGERY      Current Medications: Current Meds  Medication Sig  . ferrous sulfate 325 (65 FE) MG EC tablet Take 325 mg by mouth daily with breakfast.  . hydrocortisone 2.5 % cream Apply 1 application topically as needed.  Marland Kitchen lisinopril-hydrochlorothiazide (ZESTORETIC) 20-12.5 MG tablet Take 1 tablet by mouth daily.  . [DISCONTINUED] aspirin 325 MG tablet Take 325 mg by mouth daily.  . [DISCONTINUED] lisinopril-hydrochlorothiazide (ZESTORETIC) 20-12.5 MG tablet Take 1 tablet by mouth daily.     Allergies:   Patient has no known allergies.   Social History   Socioeconomic History  . Marital status: Single    Spouse name: Not on file  . Number of children: Not on file  . Years of  education: Not on file  . Highest education level: Not on file  Occupational History  . Not on file  Social Needs  . Financial resource strain: Not on file  . Food insecurity:    Worry: Not on file    Inability: Not on file  . Transportation needs:    Medical: Not on file    Non-medical: Not on file  Tobacco Use  . Smoking status: Current Every Day Smoker    Packs/day: 0.50    Types: Cigarettes  . Smokeless tobacco: Never Used  Substance and Sexual Activity  . Alcohol use: No  . Drug use: No  . Sexual activity: Yes  Lifestyle  . Physical activity:    Days per week: Not on file    Minutes per session: Not on file  . Stress: Not on file  Relationships  . Social connections:    Talks on phone: Not on file    Gets together: Not on file    Attends religious service: Not on file    Active member of club or organization: Not on file    Attends meetings of clubs or organizations: Not on file    Relationship status: Not on file  Other Topics Concern  . Not on file  Social History Narrative  . Not on file     Family History: The patient's family history includes Cancer in her maternal  grandmother and paternal aunt.  ROS:     EKGs/Labs/Other Studies Reviewed:       EKG: March 03, 2018: Normal sinus rhythm.  She has occasional premature ventricular contractions.  Recent Labs: 06/26/2017: TSH 1.920 11/27/2017: ALT 34; BUN 12; Creatinine, Ser 0.87; Hemoglobin 11.9; Platelets 431; Potassium 4.5; Sodium 139  Recent Lipid Panel No results found for: CHOL, TRIG, HDL, CHOLHDL, VLDL, LDLCALC, LDLDIRECT  Physical Exam:    VS:  BP 120/74   Pulse 76   Ht 5' 5"  (1.651 m)   Wt 193 lb 1.9 oz (87.6 kg)   BMI 32.14 kg/m     Wt Readings from Last 3 Encounters:  03/19/18 193 lb 1.9 oz (87.6 kg)  03/03/18 192 lb (87.1 kg)  11/27/17 187 lb 6.4 oz (85 kg)     GEN:  Well nourished, well developed in no acute distress HEENT: Normal NECK: No JVD; No carotid bruits LYMPHATICS:  No lymphadenopathy CARDIAC:   RR,  Soft systolic murmur .   She had multiple premature beats c/w premature ventricular contractions. RESPIRATORY:  Clear to auscultation without rales, wheezing or rhonchi  ABDOMEN: Soft, non-tender, non-distended MUSCULOSKELETAL:  No edema; No deformity  SKIN: Warm and dry NEUROLOGIC:  Alert and oriented x 3 PSYCHIATRIC:  Normal affect   ASSESSMENT:    1. PVC's (premature ventricular contractions)   2. Essential hypertension    PLAN:    In order of problems listed above:  1.  PVCs   Patient was found to have premature ventricular contractions on her EKG.  She has frequent premature ventricular contractions at times.  She   Has frequent PVCs.  She eats lots of processed foods.  She does not exercise.  I strongly encourage her to decrease her processed foods and to start a regular exercise program.  Says that she is stop smoking as of December.  I offered to start her on metoprolol XL but she wanted to wait and see if she felt better after changing her diet and getting some exercise. I think that these are benign PVCs so we will wait and reconsider medications at a later visit.   Medication Adjustments/Labs and Tests Ordered: Current medicines are reviewed at length with the patient today.  Concerns regarding medicines are outlined above.  Orders Placed This Encounter  Procedures  . Magnesium  . Basic Metabolic Panel (BMET)  . TSH   Meds ordered this encounter  Medications  . lisinopril-hydrochlorothiazide (ZESTORETIC) 20-12.5 MG tablet    Sig: Take 1 tablet by mouth daily.    Dispense:  90 tablet    Refill:  3  . aspirin EC 81 MG tablet    Sig: Take 1 tablet (81 mg total) by mouth daily.    Patient Instructions  Medication Instructions:  Your physician has recommended you make the following change in your medication:  DECREASE Aspirin to 81 mg daily   If you need a refill on your cardiac medications before your next appointment,  please call your pharmacy.    Lab work: TODAY - Magnesium, BMET, TSH  If you have labs (blood work) drawn today and your tests are completely normal, you will receive your results only by: Marland Kitchen MyChart Message (if you have MyChart) OR . A paper copy in the mail If you have any lab test that is abnormal or we need to change your treatment, we will call you to review the results.   Testing/Procedures: None Ordered    Follow-Up: At [facility], you  and your health needs are our priority.  As part of our continuing mission to provide you with exceptional heart care, we have created designated Provider Care Teams.  These Care Teams include your primary Cardiologist (physician) and Advanced Practice Providers (APPs -  Physician Assistants and Nurse Practitioners) who all work together to provide you with the care you need, when you need it. You will need a follow up appointment in:  8 weeks.  Please call our office 2 months in advance to schedule this appointment.  You may see Dr. Acie Fredrickson or one of the following Advanced Practice Providers on your designated Care Team: Richardson Dopp, PA-C [place], [place] . Daune Perch, NP       Signed, Mertie Moores, MD  03/19/2018 5:28 PM    [facility]

## 2018-03-19 NOTE — Patient Instructions (Addendum)
Medication Instructions:  Your physician has recommended you make the following change in your medication:  DECREASE Aspirin to 81 mg daily   If you need a refill on your cardiac medications before your next appointment, please call your pharmacy.    Lab work: TODAY - Magnesium, BMET, TSH  If you have labs (blood work) drawn today and your tests are completely normal, you will receive your results only by: Marland Kitchen MyChart Message (if you have MyChart) OR . A paper copy in the mail If you have any lab test that is abnormal or we need to change your treatment, we will call you to review the results.   Testing/Procedures: None Ordered    Follow-Up: At [facility], you and your health needs are our priority.  As part of our continuing mission to provide you with exceptional heart care, we have created designated Provider Care Teams.  These Care Teams include your primary Cardiologist (physician) and Advanced Practice Providers (APPs -  Physician Assistants and Nurse Practitioners) who all work together to provide you with the care you need, when you need it. You will need a follow up appointment in:  8 weeks.  Please call our office 2 months in advance to schedule this appointment.  You may see Dr. Acie Fredrickson or one of the following Advanced Practice Providers on your designated Care Team: Richardson Dopp, PA-C [place], [place] . Daune Perch, NP

## 2018-03-20 LAB — BASIC METABOLIC PANEL
BUN / CREAT RATIO: 20 (ref 9–23)
BUN: 17 mg/dL (ref 6–24)
CHLORIDE: 100 mmol/L (ref 96–106)
CO2: 22 mmol/L (ref 20–29)
Calcium: 9.3 mg/dL (ref 8.7–10.2)
Creatinine, Ser: 0.86 mg/dL (ref 0.57–1.00)
GFR calc Af Amer: 96 mL/min/{1.73_m2} (ref >59)
GFR calc non Af Amer: 83 mL/min/{1.73_m2} (ref >59)
Glucose: 100 mg/dL — ABNORMAL HIGH (ref 65–99)
Potassium: 4.2 mmol/L (ref 3.5–5.2)
Sodium: 137 mmol/L (ref 134–144)

## 2018-03-20 LAB — MAGNESIUM: Magnesium: 2 mg/dL (ref 1.6–2.3)

## 2018-03-20 LAB — TSH: TSH: 0.779 u[IU]/mL (ref 0.450–4.500)

## 2018-03-21 ENCOUNTER — Encounter (HOSPITAL_COMMUNITY): Payer: Self-pay | Admitting: Psychiatry

## 2018-03-21 ENCOUNTER — Ambulatory Visit (INDEPENDENT_AMBULATORY_CARE_PROVIDER_SITE_OTHER): Payer: No Typology Code available for payment source | Admitting: Psychiatry

## 2018-03-21 VITALS — BP 115/71 | HR 78 | Ht 64.0 in | Wt 192.0 lb

## 2018-03-21 DIAGNOSIS — F411 Generalized anxiety disorder: Secondary | ICD-10-CM | POA: Diagnosis not present

## 2018-03-21 MED ORDER — SERTRALINE HCL 25 MG PO TABS: ORAL_TABLET | ORAL | 0 refills | Status: DC

## 2018-03-21 MED ORDER — TRAZODONE HCL 50 MG PO TABS: 50.0000 mg | ORAL_TABLET | Freq: Every evening | ORAL | 0 refills | Status: DC | PRN

## 2018-03-21 MED FILL — traZODone HCL 50 MG TABS: 50 | 30 days supply | Qty: 30 | Fill #0

## 2018-03-21 MED FILL — SERTRALINE HCL 25 MG TABLET: 25 | 30 days supply | Qty: 53 | Fill #0
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
## 2018-03-21 NOTE — Patient Instructions (Signed)
Plan:  1. Start sertraline (Zoloft) 25 mg with breakfast for 7 days then increase dose to two tablets for a total of 50 mg in AM.  2. Trazodone 50 mg at bedtime for sleep, you may use it on as needed basis but I recommend taking it each night for the first week,

## 2018-04-16 ENCOUNTER — Ambulatory Visit (INDEPENDENT_AMBULATORY_CARE_PROVIDER_SITE_OTHER): Payer: No Typology Code available for payment source | Admitting: Emergency Medicine

## 2018-04-16 ENCOUNTER — Other Ambulatory Visit: Payer: Self-pay

## 2018-04-16 ENCOUNTER — Encounter: Payer: Self-pay | Admitting: Emergency Medicine

## 2018-04-16 VITALS — BP 113/73 | HR 103 | Temp 99.2°F | Resp 18 | Ht 64.0 in | Wt 191.0 lb

## 2018-04-16 DIAGNOSIS — R6889 Other general symptoms and signs: Secondary | ICD-10-CM | POA: Diagnosis not present

## 2018-04-16 DIAGNOSIS — R05 Cough: Secondary | ICD-10-CM | POA: Diagnosis not present

## 2018-04-16 DIAGNOSIS — J22 Unspecified acute lower respiratory infection: Secondary | ICD-10-CM | POA: Diagnosis not present

## 2018-04-16 DIAGNOSIS — R059 Cough, unspecified: Secondary | ICD-10-CM

## 2018-04-16 MED ORDER — HYDROCODONE-HOMATROPINE 5-1.5 MG/5ML PO SYRP: 5.0000 mL | ORAL_SOLUTION | Freq: Four times a day (QID) | ORAL | 0 refills | Status: DC | PRN

## 2018-04-16 MED ORDER — AZITHROMYCIN 250 MG PO TABS: ORAL_TABLET | ORAL | 0 refills | Status: DC

## 2018-04-16 MED ORDER — BENZONATATE 200 MG PO CAPS: 200.0000 mg | ORAL_CAPSULE | Freq: Two times a day (BID) | ORAL | 0 refills | Status: DC | PRN

## 2018-04-16 MED FILL — AZITHROMYCIN 250 MG TABLET: 250 | 5 days supply | Qty: 6 | Fill #0

## 2018-04-16 MED FILL — BENZONATATE 200 MG CAPS: 200 | 10 days supply | Qty: 20 | Fill #0

## 2018-04-16 MED FILL — HYDROCODONE-HOMATROPINE SOL: 5-1.5 | 6 days supply | Qty: 120 | Fill #0

## 2018-04-16 NOTE — Patient Instructions (Addendum)
     If you have lab work done today you will be contacted with your lab results within the next 2 weeks.  If you have not heard from [place] then please contact us. The fastest way to get your results is to register for My Chart.   IF you received an x-ray today, you will receive an invoice from [facility]. Please contact [facility] at [phone] with questions or concerns regarding your invoice.   IF you received labwork today, you will receive an invoice from [facility]. Please contact [facility] at [phone] with questions or concerns regarding your invoice.   Our billing staff will not be able to assist you with questions regarding bills from these companies.  You will be contacted with the lab results as soon as they are available. The fastest way to get your results is to activate your My Chart account. Instructions are located on the last page of this paperwork. If you have not heard from [place] regarding the results in 2 weeks, please contact this office.     Cough, Adult  A cough helps to clear your throat and lungs. A cough may last only 2-3 weeks (acute), or it may last longer than 8 weeks (chronic). Many different things can cause a cough. A cough may be a sign of an illness or another medical condition. Follow these instructions at home:  Pay attention to any changes in your cough.  Take medicines only as told by your doctor. ? If you were prescribed an antibiotic medicine, take it as told by your doctor. Do not stop taking it even if you start to feel better. ? Talk with your doctor before you try using a cough medicine.  Drink enough fluid to keep your pee (urine) clear or pale yellow.  If the air is dry, use a cold steam vaporizer or humidifier in your home.  Stay away from things that make you cough at work or at home.  If your cough is worse at night, try using extra pillows to raise your head up higher while you sleep.  Do not smoke, and try not to  be around smoke. If you need help quitting, ask your doctor.  Do not have caffeine.  Do not drink alcohol.  Rest as needed. Contact a doctor if:  You have new problems (symptoms).  You cough up yellow fluid (pus).  Your cough does not get better after 2-3 weeks, or your cough gets worse.  Medicine does not help your cough and you are not sleeping well.  You have pain that gets worse or pain that is not helped with medicine.  You have a fever.  You are losing weight and you do not know why.  You have night sweats. Get help right away if:  You cough up blood.  You have trouble breathing.  Your heartbeat is very fast. This information is not intended to replace advice given to you by your health care provider. Make sure you discuss any questions you have with your health care provider. Document Released: 10/07/2010 Document Revised: 07/02/2015 Document Reviewed: 04/02/2014 Elsevier Interactive Patient Education  2019 [employer].
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
## 2018-04-18 ENCOUNTER — Ambulatory Visit (HOSPITAL_COMMUNITY): Payer: No Typology Code available for payment source | Admitting: Psychiatry

## 2018-05-08 ENCOUNTER — Telehealth: Payer: Self-pay

## 2018-05-08 NOTE — Telephone Encounter (Signed)
   Primary Cardiologist:  No primary care provider on file.   Patient contacted.  History reviewed.  No symptoms to suggest any unstable cardiac conditions.  Based on discussion, with current pandemic situation, we will be postponing this appointment for Kaylee Maxwell with a plan for f/u in 6-12 wks or sooner if feasible/necessary.  If symptoms change, she has been instructed to contact our office.   Routing to C19 CANCEL pool for tracking (P CV DIV CV19 CANCEL - reason for visit "other.") and assigning priority (1 = 4-6 wks, 2 = 6-12 wks, 3 = >12 wks).   Jones Broom, CMA  05/08/2018 4:30 PM         .

## 2018-05-22 ENCOUNTER — Ambulatory Visit: Payer: Self-pay | Admitting: Cardiovascular Disease

## 2018-05-25 ENCOUNTER — Telehealth: Payer: Self-pay | Admitting: Cardiovascular Disease

## 2018-05-25 NOTE — Telephone Encounter (Signed)
Spoke to patient and offered her an appointment with Kathyrn Drown for next week.  Patient said that she is feeling better and will call to reschedule her April appointment at a later date.

## 2018-05-31 ENCOUNTER — Ambulatory Visit: Payer: Self-pay

## 2018-05-31 ENCOUNTER — Other Ambulatory Visit: Payer: Self-pay

## 2018-05-31 ENCOUNTER — Telehealth (INDEPENDENT_AMBULATORY_CARE_PROVIDER_SITE_OTHER): Payer: No Typology Code available for payment source | Admitting: Family Medicine

## 2018-05-31 DIAGNOSIS — N924 Excessive bleeding in the premenopausal period: Secondary | ICD-10-CM | POA: Diagnosis not present

## 2018-05-31 NOTE — Telephone Encounter (Signed)
Pt c/o moderate bleeding and passing nickle sized clots. Pt stated that the bleeding started last Wednesday. Pt stated her period usually lasts 4 days with the 5th day of spotting. Pt denies pain or cramping. Pt is experiencing fatigue. Care advice given and pt verbalized understanding. Pt verified email and made aware that the visit will be virtual.Call transferred to office to make appt.    Reason for Disposition . MODERATE vaginal bleeding (i.e., soaking 1 pad or tampon per hour and present > 6 hours; 1 menstrual cup every 6 hours)  Answer Assessment - Initial Assessment Questions 1. AMOUNT: "Describe the bleeding that you are having."    - SPOTTING: spotting, or pinkish / brownish mucous discharge; does not fill panti-liner or pad    - MILD:  less than 1 pad / hour; less than patient's usual menstrual bleeding   - MODERATE: 1-2 pads / hour; 1 menstrual cup every 6 hours; small-medium blood clots (e.g., pea, grape, small coin)   - SEVERE: soaking 2 or more pads/hour for 2 or more hours; 1 menstrual cup every 2 hours; bleeding not contained by pads or continuous red blood from vagina; large blood clots (e.g., golf ball, large coin)      moderate 2. ONSET: "When did the bleeding begin?" "Is it continuing now?"    1 week ago 3. MENSTRUAL PERIOD: "When was the last normal menstrual period?" "How is this different than your period?"     Period normal 4 days with 5 th day spotting 4. REGULARITY: "How regular are your periods?"     28 -29 days 5. ABDOMINAL PAIN: "Do you have any pain?" "How bad is the pain?"  (e.g., Scale 1-10; mild, moderate, or severe)   - MILD (1-3): doesn't interfere with normal activities, abdomen soft and not tender to touch    - MODERATE (4-7): interferes with normal activities or awakens from sleep, tender to touch    - SEVERE (8-10): excruciating pain, doubled over, unable to do any normal activities      No pain 6. PREGNANCY: "Could you be pregnant?" "Are you sexually  active?" "Did you recently give birth?"     No-yes-no 7. BREASTFEEDING: "Are you breastfeeding?"    no 8. HORMONES: "Are you taking any hormone medications, prescription or OTC?" (e.g., birth control pills, estrogen)     no 9. BLOOD THINNERS: "Do you take any blood thinners?" (e.g., Coumadin/warfarin, Pradaxa/dabigatran, aspirin)     Takes 2 baby aspirins per day 10. CAUSE: "What do you think is causing the bleeding?" (e.g., recent gyn surgery, recent gyn procedure; known bleeding disorder, cervical cancer, polycystic ovarian disease, fibroids)         Pt doesn't know 11. HEMODYNAMIC STATUS: "Are you weak or feeling lightheaded?" If so, ask: "Can you stand and walk normally?"        Fatigue - yes 12. OTHER SYMPTOMS: "What other symptoms are you having with the bleeding?" (e.g., passed tissue, vaginal discharge, fever, menstrual-type cramps)       Passing nickel sized clots  Protocols used: VAGINAL BLEEDING - ABNORMAL-A-AH
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
## 2018-05-31 NOTE — Progress Notes (Signed)
heavy menstrual cycle for the last 8 days. Usually 4 days.  Hast change pad every couple hours.   [phone]

## 2018-06-05 ENCOUNTER — Telehealth: Payer: Self-pay | Admitting: Family Medicine

## 2018-06-05 NOTE — Telephone Encounter (Signed)
Copied from [place] [id number]. Topic: Referral - Request for Referral >> Jun 05, 2018 12:34 PM Nils Flack wrote: Has patient seen PCP for this complaint? Yes.   *If NO, is insurance requiring patient see PCP for this issue before PCP can refer them? Referral for which specialty: ob -gyn  Preferred provider/office: Dr Sanjuana Kava [facility] Reason for referral: ongoing issues, would not disclose  Cb for pt Is [phone]

## 2018-06-06 ENCOUNTER — Other Ambulatory Visit: Payer: Self-pay | Admitting: *Deleted

## 2018-06-06 DIAGNOSIS — N924 Excessive bleeding in the premenopausal period: Secondary | ICD-10-CM

## 2018-06-06 NOTE — Telephone Encounter (Signed)
Referral placed.

## 2018-06-22 ENCOUNTER — Other Ambulatory Visit (HOSPITAL_COMMUNITY): Payer: Self-pay | Admitting: Psychiatry

## 2018-06-22 MED FILL — LISINOPRIL-HCTZ 20-12.5 MG: 20-12.5 | 90 days supply | Qty: 90 | Fill #0

## 2018-06-22 MED FILL — HYDROCORTISONE 2.5% CREAM: 2.5 | 14 days supply | Qty: 30 | Fill #0

## 2018-07-16 ENCOUNTER — Other Ambulatory Visit: Payer: Self-pay

## 2018-07-16 ENCOUNTER — Ambulatory Visit (INDEPENDENT_AMBULATORY_CARE_PROVIDER_SITE_OTHER): Payer: No Typology Code available for payment source | Admitting: Psychiatry

## 2018-07-16 DIAGNOSIS — F411 Generalized anxiety disorder: Secondary | ICD-10-CM | POA: Diagnosis not present

## 2018-07-16 DIAGNOSIS — F5101 Primary insomnia: Secondary | ICD-10-CM | POA: Diagnosis not present

## 2018-07-16 MED ORDER — TRAZODONE HCL 50 MG PO TABS: 50.0000 mg | ORAL_TABLET | Freq: Every evening | ORAL | 0 refills | Status: DC | PRN

## 2018-07-16 MED FILL — traZODone HCL 50 MG TABS: 50 | 90 days supply | Qty: 90 | Fill #0
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
## 2018-07-17 ENCOUNTER — Other Ambulatory Visit: Payer: Self-pay

## 2018-07-17 ENCOUNTER — Encounter: Payer: Self-pay | Admitting: Family Medicine

## 2018-07-17 ENCOUNTER — Ambulatory Visit (INDEPENDENT_AMBULATORY_CARE_PROVIDER_SITE_OTHER): Payer: No Typology Code available for payment source | Admitting: Family Medicine

## 2018-07-17 ENCOUNTER — Other Ambulatory Visit (HOSPITAL_COMMUNITY)
Admission: RE | Admit: 2018-07-17 | Discharge: 2018-07-17 | Disposition: A | Discharge status: Home / Self Care | Payer: No Typology Code available for payment source | Source: Ambulatory Visit | Attending: Family Medicine | Admitting: Family Medicine

## 2018-07-17 VITALS — BP 119/79 | HR 103 | Temp 99.0°F | Ht 65.35 in | Wt 194.8 lb

## 2018-07-17 DIAGNOSIS — N898 Other specified noninflammatory disorders of vagina: Secondary | ICD-10-CM | POA: Insufficient documentation

## 2018-07-17 DIAGNOSIS — R300 Dysuria: Secondary | ICD-10-CM | POA: Insufficient documentation

## 2018-07-17 LAB — POCT URINALYSIS DIP (MANUAL ENTRY)
Bilirubin, UA: NEGATIVE
Glucose, UA: NEGATIVE mg/dL
Ketones, POC UA: NEGATIVE mg/dL
Nitrite, UA: NEGATIVE
Protein Ur, POC: NEGATIVE mg/dL
Spec Grav, UA: 1.02 (ref 1.010–1.025)
Urobilinogen, UA: 0.2 E.U./dL
pH, UA: 6 (ref 5.0–8.0)

## 2018-07-17 LAB — POCT WET + KOH PREP: Trich by wet prep: ABSENT

## 2018-07-17 MED ORDER — FLUCONAZOLE 150 MG PO TABS: ORAL_TABLET | ORAL | 0 refills | Status: DC

## 2018-07-17 MED ORDER — METRONIDAZOLE 500 MG PO TABS: 500.0000 mg | ORAL_TABLET | Freq: Two times a day (BID) | ORAL | 0 refills | Status: DC

## 2018-07-17 MED FILL — FLUCONAZOLE 150 MG TABS: 150 | 7 days supply | Qty: 2 | Fill #0

## 2018-07-17 MED FILL — metroNIDAZOLE 500 MG TABS: 500 | 7 days supply | Qty: 14 | Fill #0

## 2018-07-17 NOTE — Patient Instructions (Signed)
° ° ° °  If you have lab work done today you will be contacted with your lab results within the next 2 weeks.  If you have not heard from us then please contact us. The fastest way to get your results is to register for My Chart. ° ° °IF you received an x-ray today, you will receive an invoice from [facility]. Please contact [facility] at [phone] with questions or concerns regarding your invoice.  ° °IF you received labwork today, you will receive an invoice from [facility]. Please contact [facility] at [phone] with questions or concerns regarding your invoice.  ° °Our billing staff will not be able to assist you with questions regarding bills from these companies. ° °You will be contacted with the lab results as soon as they are available. The fastest way to get your results is to activate your My Chart account. Instructions are located on the last page of this paperwork. If you have not heard from us regarding the results in 2 weeks, please contact this office. °  ° ° ° °
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
## 2018-07-19 LAB — GC/CHLAMYDIA PROBE AMP (~~LOC~~) NOT AT ARMC
Chlamydia: NEGATIVE
Neisseria Gonorrhea: NEGATIVE

## 2018-07-19 LAB — URINE CULTURE

## 2018-08-29 ENCOUNTER — Encounter: Payer: Self-pay | Admitting: Family Medicine

## 2018-08-29 ENCOUNTER — Other Ambulatory Visit: Payer: Self-pay

## 2018-08-29 ENCOUNTER — Ambulatory Visit (INDEPENDENT_AMBULATORY_CARE_PROVIDER_SITE_OTHER): Payer: No Typology Code available for payment source | Admitting: Family Medicine

## 2018-08-29 VITALS — BP 123/84 | HR 108 | Temp 98.8°F | Resp 17 | Ht 65.35 in | Wt 192.4 lb

## 2018-08-29 DIAGNOSIS — Z30011 Encounter for initial prescription of contraceptive pills: Secondary | ICD-10-CM

## 2018-08-29 DIAGNOSIS — Z0001 Encounter for general adult medical examination with abnormal findings: Secondary | ICD-10-CM | POA: Diagnosis not present

## 2018-08-29 DIAGNOSIS — E1169 Type 2 diabetes mellitus with other specified complication: Secondary | ICD-10-CM

## 2018-08-29 DIAGNOSIS — F17200 Nicotine dependence, unspecified, uncomplicated: Secondary | ICD-10-CM

## 2018-08-29 DIAGNOSIS — Z114 Encounter for screening for human immunodeficiency virus [HIV]: Secondary | ICD-10-CM | POA: Diagnosis not present

## 2018-08-29 DIAGNOSIS — Z0000 Encounter for general adult medical examination without abnormal findings: Secondary | ICD-10-CM

## 2018-08-29 DIAGNOSIS — Z1239 Encounter for other screening for malignant neoplasm of breast: Secondary | ICD-10-CM | POA: Diagnosis not present

## 2018-08-29 LAB — POCT GLYCOSYLATED HEMOGLOBIN (HGB A1C): Hemoglobin A1C: 5.2 % (ref 4.0–5.6)

## 2018-08-29 LAB — POCT URINE PREGNANCY: Preg Test, Ur: NEGATIVE

## 2018-08-29 MED ORDER — NORETHINDRONE 0.35 MG PO TABS: 1.0000 | ORAL_TABLET | Freq: Every day | ORAL | 11 refills | Status: DC

## 2018-08-29 MED ORDER — NICOTINE 7 MG/24HR TD PT24: 7.0000 mg | MEDICATED_PATCH | Freq: Every day | TRANSDERMAL | 3 refills | Status: DC

## 2018-08-29 MED ORDER — NICOTINE POLACRILEX 2 MG MT GUM: CHEWING_GUM | OROMUCOSAL | 3 refills | Status: AC

## 2018-08-29 MED FILL — NICOTINE 7 MG/24HR PATCH: 7 | 28 days supply | Qty: 28 | Fill #0

## 2018-08-29 MED FILL — NORLYDA 0.35 MG TABS: 0.35 | 28 days supply | Qty: 28 | Fill #0

## 2018-08-29 MED FILL — SM NICOTINE 2 MG CHEWING GU: 2 | 28 days supply | Qty: 110 | Fill #0
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
## 2018-08-29 NOTE — Patient Instructions (Addendum)
We recommend that you schedule a mammogram for breast cancer screening. Typically, you do not need a referral to do this. Please contact a local imaging center to schedule your mammogram.   [facility] ([place]) - [phone] or [phone]      If you have lab work done today you will be contacted with your lab results within the next 2 weeks.  If you have not heard from [place] then please contact us. The fastest way to get your results is to register for My Chart.   IF you received an x-ray today, you will receive an invoice from [facility]. Please contact [facility] at [phone] with questions or concerns regarding your invoice.   IF you received labwork today, you will receive an invoice from [facility]. Please contact [facility] at [phone] with questions or concerns regarding your invoice.   Our billing staff will not be able to assist you with questions regarding bills from these companies.  You will be contacted with the lab results as soon as they are available. The fastest way to get your results is to activate your My Chart account. Instructions are located on the last page of this paperwork. If you have not heard from [place] regarding the results in 2 weeks, please contact this office.     Perimenopause  Perimenopause is the normal time of life before and after menstrual periods stop completely (menopause). Perimenopause can begin 2-8 years before menopause, and it usually lasts for 1 year after menopause. During perimenopause, the ovaries may or may not produce an egg. What are the causes? This condition is caused by a natural change in hormone levels that happens as you get older. What increases the risk? This condition is more likely to start at an earlier age if you have certain medical conditions or treatments, including:  A tumor of the pituitary gland in the brain.  A disease that affects the ovaries and hormone  production.  Radiation treatment for cancer.  Certain cancer treatments, such as chemotherapy or hormone (anti-estrogen) therapy.  Heavy smoking and excessive alcohol use.  Family history of early menopause. What are the signs or symptoms? Perimenopausal changes affect each woman differently. Symptoms of this condition may include:  Hot flashes.  Night sweats.  Irregular menstrual periods.  Decreased sex drive.  Vaginal dryness.  Headaches.  Mood swings.  Depression.  Memory problems or trouble concentrating.  Irritability.  Tiredness.  Weight gain.  Anxiety.  Trouble getting pregnant. How is this diagnosed? This condition is diagnosed based on your medical history, a physical exam, your age, your menstrual history, and your symptoms. Hormone tests may also be done. How is this treated? In some cases, no treatment is needed. You and your health care provider should make a decision together about whether treatment is necessary. Treatment will be based on your individual condition and preferences. Various treatments are available, such as:  Menopausal hormone therapy (MHT).  Medicines to treat specific symptoms.  Acupuncture.  Vitamin or herbal supplements. Before starting treatment, make sure to let your health care provider know if you have a personal or family history of:  Heart disease.  Breast cancer.  Blood clots.  Diabetes.  Osteoporosis. Follow these instructions at home: Lifestyle  Do not use any products that contain nicotine or tobacco, such as cigarettes and e-cigarettes. If you need help quitting, ask your health care provider.  Eat a balanced diet that includes fresh fruits and vegetables, whole grains, soybeans, eggs,  lean meat, and low-fat dairy.  Get at least 30 minutes of physical activity on 5 or more days each week.  Avoid alcoholic and caffeinated beverages, as well as spicy foods. This may help prevent hot flashes.  Get 7-8  hours of sleep each night.  Dress in layers that can be removed to help you manage hot flashes.  Find ways to manage stress, such as deep breathing, meditation, or journaling. General instructions  Keep track of your menstrual periods, including: ? When they occur. ? How heavy they are and how long they last. ? How much time passes between periods.  Keep track of your symptoms, noting when they start, how often you have them, and how long they last.  Take over-the-counter and prescription medicines only as told by your health care provider.  Take vitamin supplements only as told by your health care provider. These may include calcium, vitamin E, and vitamin D.  Use vaginal lubricants or moisturizers to help with vaginal dryness and improve comfort during sex.  Talk with your health care provider before starting any herbal supplements.  Keep all follow-up visits as told by your health care provider. This is important. This includes any group therapy or counseling. Contact a health care provider if:  You have heavy vaginal bleeding or pass blood clots.  Your period lasts more than 2 days longer than normal.  Your periods are recurring sooner than 21 days.  You bleed after having sex. Get help right away if:  You have chest pain, trouble breathing, or trouble talking.  You have severe depression.  You have pain when you urinate.  You have severe headaches.  You have vision problems. Summary  Perimenopause is the time when a woman's body begins to move into menopause. This may happen naturally or as a result of other health problems or medical treatments.  Perimenopause can begin 2-8 years before menopause, and it usually lasts for 1 year after menopause.  Perimenopausal symptoms can be managed through medicines, lifestyle changes, and complementary therapies such as acupuncture. This information is not intended to replace advice given to you by your health care  provider. Make sure you discuss any questions you have with your health care provider. Document Released: 03/03/2004 Document Revised: 01/06/2017 Document Reviewed: 03/01/2016 Elsevier Patient Education  2020 [employer].

## 2018-08-30 LAB — COMPREHENSIVE METABOLIC PANEL
ALT: 43 IU/L — ABNORMAL HIGH (ref 0–32)
AST: 66 IU/L — ABNORMAL HIGH (ref 0–40)
Albumin/Globulin Ratio: 1.3 (ref 1.2–2.2)
Albumin: 4.4 g/dL (ref 3.8–4.8)
Alkaline Phosphatase: 67 IU/L (ref 39–117)
BUN/Creatinine Ratio: 15 (ref 9–23)
BUN: 12 mg/dL (ref 6–24)
Bilirubin Total: 0.3 mg/dL (ref 0.0–1.2)
CO2: 22 mmol/L (ref 20–29)
Calcium: 10.1 mg/dL (ref 8.7–10.2)
Chloride: 98 mmol/L (ref 96–106)
Creatinine, Ser: 0.8 mg/dL (ref 0.57–1.00)
GFR calc Af Amer: 104 mL/min/{1.73_m2} (ref >59)
GFR calc non Af Amer: 91 mL/min/{1.73_m2} (ref >59)
Globulin, Total: 3.3 g/dL (ref 1.5–4.5)
Glucose: 104 mg/dL — ABNORMAL HIGH (ref 65–99)
Potassium: 4 mmol/L (ref 3.5–5.2)
Sodium: 138 mmol/L (ref 134–144)
Total Protein: 7.7 g/dL (ref 6.0–8.5)

## 2018-08-30 LAB — LIPID PANEL
Chol/HDL Ratio: 3.6 ratio (ref 0.0–4.4)
Cholesterol, Total: 220 mg/dL — ABNORMAL HIGH (ref 100–199)
HDL: 61 mg/dL (ref >39)
LDL Calculated: 134 mg/dL — ABNORMAL HIGH (ref 0–99)
Triglycerides: 127 mg/dL (ref 0–149)
VLDL Cholesterol Cal: 25 mg/dL (ref 5–40)

## 2018-08-30 LAB — HIV ANTIBODY (ROUTINE TESTING W REFLEX): HIV Screen 4th Generation wRfx: NONREACTIVE

## 2018-08-30 MED ORDER — PRAVASTATIN SODIUM 10 MG PO TABS: 10.0000 mg | ORAL_TABLET | Freq: Every day | ORAL | 3 refills | Status: DC

## 2018-08-30 MED FILL — PRAVASTATIN NA 10 MG TAB: 10 | 90 days supply | Qty: 90 | Fill #0

## 2018-09-25 MED FILL — NORETHINDRONE 0.35 MG TAB: 0.35 | 28 days supply | Qty: 28 | Fill #0

## 2018-09-25 MED FILL — LISINOPRIL-HCTZ 20-12.5 MG: 20-12.5 | 90 days supply | Qty: 90 | Fill #1

## 2018-10-16 ENCOUNTER — Ambulatory Visit (INDEPENDENT_AMBULATORY_CARE_PROVIDER_SITE_OTHER): Payer: No Typology Code available for payment source | Admitting: Psychiatry

## 2018-10-16 ENCOUNTER — Other Ambulatory Visit: Payer: Self-pay

## 2018-10-16 DIAGNOSIS — F5101 Primary insomnia: Secondary | ICD-10-CM

## 2018-10-16 DIAGNOSIS — F411 Generalized anxiety disorder: Secondary | ICD-10-CM | POA: Diagnosis not present

## 2018-10-16 MED ORDER — TRAZODONE HCL 50 MG PO TABS: 50.0000 mg | ORAL_TABLET | Freq: Every evening | ORAL | 1 refills | Status: DC | PRN

## 2018-10-16 MED FILL — traZODone HCL 50 MG TABS: 50 | 90 days supply | Qty: 90 | Fill #0

## 2018-10-16 NOTE — Progress Notes (Signed)
BH MD/PA/NP OP Progress Note  10/16/2018 2:42 PM Kaylee Maxwell  MRN:  [id number] Interview was conducted by phone and I verified that I was speaking with the correct person using two identifiers. I discussed the limitations of evaluation and management by telemedicine and  the availability of in person appointments. Patient expressed understanding and agreed to proceed.  Chief Complaint: "I am well now".  HPI: 43 yo single female who presented in mid February reproting with several months of excessive worrying and insomnia. She has never been prescribed any medications for anxiety. Wana ascribes financial stress (raising two sons alone) to being a source of her anxieties and mixed insomnia.  We have started her on sertraline 50 mg which she took for a month but then stopped. She is unsure if it was beneficial but tolerated it well. She has also been prescribed trazodone 50 mg for insomnia which works well.  Anxiety subsided, she denies worrying excessively about her situation, admits that she is under lower financial stress and is dealing with COVID-19 stress well. She works for [employer] from home at this time. Appetite is good, she denies feeling depressed and she reports that insomnia is no longer a problem as long as she takes trazodone.   Visit Diagnosis:    ICD-10-CM   1. Primary insomnia  F51.01   2. GAD (generalized anxiety disorder)  F41.1     Past Psychiatric History: Please see intake H&P.  Past Medical History:  Past Medical History:  Diagnosis Date  . Anxiety 03/04/2018  . Breast cancer screening 03/04/2018  . Heart palpitations 03/04/2018  . Hypertension   . Irregular heartbeat 03/04/2018  . Scalp mass 10/09/2012    Past Surgical History:  Procedure Laterality Date  . ANKLE FRACTURE SURGERY    . cyst removal from scalp    . left leg bone removal      Family Psychiatric History: Reviewed.  Family History:  Family History  Problem Relation Age of Onset  . Cancer  Paternal Aunt        pancreatic  . Cancer Maternal Grandmother        lung  . Autism spectrum disorder Sister     Social History:  Social History   Socioeconomic History  . Marital status: Single    Spouse name: Not on file  . Number of children: 2  . Years of education: Not on file  . Highest education level: Associate degree: academic program  Occupational History  . Not on file  Social Needs  . Financial resource strain: Hard  . Food insecurity    Worry: Never true    Inability: Never true  . Transportation needs    Medical: No    Non-medical: No  Tobacco Use  . Smoking status: Former Research scientist (life sciences)  . Smokeless tobacco: Never Used  Substance and Sexual Activity  . Alcohol use: No  . Drug use: No  . Sexual activity: Yes  Lifestyle  . Physical activity    Days per week: 0 days    Minutes per session: 0 min  . Stress: Rather much  Relationships  . Social Herbalist on phone: Three times a week    Gets together: Twice a week    Attends religious service: More than 4 times per year    Active member of club or organization: Yes    Attends meetings of clubs or organizations: Never    Relationship status: Never married  Other Topics Concern  .  Not on file  Social History Narrative  . Not on file    Allergies: No Known Allergies  Metabolic Disorder Labs: Lab Results  Component Value Date   HGBA1C 5.2 08/29/2018   No results found for: PROLACTIN Lab Results  Component Value Date   CHOL 220 (H) 08/29/2018   TRIG 127 08/29/2018   HDL 61 08/29/2018   CHOLHDL 3.6 08/29/2018   LDLCALC 134 (H) 08/29/2018   Lab Results  Component Value Date   TSH 0.779 03/19/2018   TSH 1.920 06/26/2017    Therapeutic Level Labs: No results found for: LITHIUM No results found for: VALPROATE No components found for:  CBMZ  Current Medications: Current Outpatient Medications  Medication Sig Dispense Refill  . aspirin EC 81 MG tablet Take 1 tablet (81 mg total) by  mouth daily.    . ferrous sulfate 325 (65 FE) MG EC tablet Take 325 mg by mouth daily with breakfast.    . fluconazole (DIFLUCAN) 150 MG tablet Take one po today and take one po in 1 week 2 tablet 0  . hydrocortisone 2.5 % cream Apply 1 application topically as needed.    Marland Kitchen lisinopril-hydrochlorothiazide (ZESTORETIC) 20-12.5 MG tablet Take 1 tablet by mouth daily. 90 tablet 3  . metroNIDAZOLE (FLAGYL) 500 MG tablet Take 1 tablet (500 mg total) by mouth 2 (two) times daily. 14 tablet 0  . nicotine (NICODERM CQ) 7 mg/24hr patch Place 1 patch (7 mg total) onto the skin daily. 28 patch 3  . norethindrone (ORTHO MICRONOR) 0.35 MG tablet Take 1 tablet (0.35 mg total) by mouth daily. 1 Package 11  . pravastatin (PRAVACHOL) 10 MG tablet Take 1 tablet (10 mg total) by mouth daily. 90 tablet 3  . traZODone (DESYREL) 50 MG tablet Take 1 tablet (50 mg total) by mouth at bedtime as needed for sleep. 90 tablet 1   No current facility-administered medications for this visit.      Psychiatric Specialty Exam: Review of Systems  Psychiatric/Behavioral: The patient has insomnia.   All other systems reviewed and are negative.   There were no vitals taken for this visit.There is no height or weight on file to calculate BMI.  General Appearance: NA  Eye Contact:  NA  Speech:  Clear and Coherent and Normal Rate  Volume:  Normal  Mood:  Mildly anxious  Affect:  NA  Thought Process:  Goal Directed and Linear  Orientation:  Full (Time, Place, and Person)  Thought Content: Logical   Suicidal Thoughts:  No  Homicidal Thoughts:  No  Memory:  Immediate;   Good Recent;   Good Remote;   Good  Judgement:  Good  Insight:  Good  Psychomotor Activity:  NA  Concentration:  Concentration: Good  Recall:  Good  Fund of Knowledge: Good  Language: Good  Akathisia:  Negative  Handed:  Right  AIMS (if indicated): not done  Assets:  Communication Skills Desire for Improvement Housing Physical  Health Resilience Talents/Skills Vocational/Educational  ADL's:  Intact  Cognition: WNL  Sleep:  Good   Screenings: CAGE-AID     Office Visit from 03/21/2018 in [place] Score  1    GAD-7     Office Visit from 03/21/2018 in [facility] Office Visit from 03/03/2018 in [facility] at [facility]  Total GAD-7 Score  6  2    PHQ2-9     Office Visit from 08/29/2018 in Primary Care at [place]  from 07/17/2018 in Primary Care at [place] from 05/31/2018 in Primary Care at [place] from 04/16/2018 in [facility] [place] from 03/03/2018 in Primary Care at [facility] Total Score  0  0  0  0  0       Assessment and Plan: 43 yo single female who presented in mid February reproting with several months of excessive worrying and insomnia. She has never been prescribed any medications for anxiety. Financial stress (raising two sons alone) was a source of her anxieties and mixed insomnia.  We have started her on sertraline 50 mg which she took for a month but then stopped. She is unsure if it was beneficial but tolerated it well. She has also been prescribed trazodone 50 mg for insomnia which works well.  Anxiety subsided, she denies worrying excessively about her situation, admits that she is under lower financial stress and is dealing with COVID-19 stress well. She works for [employer] from home at this time. Appetite is good, she denies feeling depressed and she reports that insomnia is no longer a problem as long as she takes trazodone.   Dx: Primary insomnia; GAD  Plan: Continue trazodone 50 mg at HS prn insomnia. Return to clinic in 5-6 months or prn. The plan was discussed with patient who had an opportunity to ask questions and these were all answered. I spend 25 minutes in phone consultation with the patient.     Natallie Acre, MD 10/16/2018, 2:42 PM

## 2018-10-17 ENCOUNTER — Ambulatory Visit: Payer: No Typology Code available for payment source

## 2018-12-06 ENCOUNTER — Other Ambulatory Visit: Payer: Self-pay | Admitting: Family Medicine

## 2018-12-06 DIAGNOSIS — Z1231 Encounter for screening mammogram for malignant neoplasm of breast: Secondary | ICD-10-CM

## 2018-12-17 MED FILL — LISINOPRIL-HCTZ 20-12.5 MG: 20-12.5 | 90 days supply | Qty: 90 | Fill #2

## 2019-01-11 ENCOUNTER — Ambulatory Visit
Admission: RE | Admit: 2019-01-11 | Discharge: 2019-01-11 | Disposition: A | Discharge status: Home / Self Care | Payer: No Typology Code available for payment source | Source: Ambulatory Visit | Attending: Family Medicine | Admitting: Family Medicine

## 2019-01-11 ENCOUNTER — Other Ambulatory Visit: Payer: Self-pay

## 2019-01-11 DIAGNOSIS — Z1231 Encounter for screening mammogram for malignant neoplasm of breast: Secondary | ICD-10-CM

## 2019-03-13 MED FILL — traZODone HCL 50 MG TABS: 50 | 90 days supply | Qty: 90 | Fill #0

## 2019-03-20 ENCOUNTER — Other Ambulatory Visit: Payer: Self-pay | Admitting: Cardiovascular Disease

## 2019-03-20 MED FILL — LISINOPRIL-HCTZ 20-12.5 MG: 20-12.5 | 30 days supply | Qty: 30 | Fill #0

## 2019-04-25 ENCOUNTER — Telehealth: Payer: Self-pay | Admitting: Cardiovascular Disease

## 2019-04-25 MED ORDER — LISINOPRIL-HYDROCHLOROTHIAZIDE 20-12.5 MG PO TABS: 1.0000 | ORAL_TABLET | Freq: Every day | ORAL | 0 refills | Status: DC

## 2019-04-25 MED FILL — LISINOPRIL-HCTZ 20-12.5 MG: 20-12.5 | 60 days supply | Qty: 60 | Fill #0

## 2019-04-25 NOTE — Telephone Encounter (Signed)
New Message  Received a call from the patient who needs to get her medication refilled; however, she said that she no longer needs to see dr. Acie Fredrickson and in order to get her medication, she has to schedule an appt. she wants to know if there anything she can do to still get her medication?  Please call to discuss

## 2019-04-25 NOTE — Telephone Encounter (Signed)
Spoke with patient who states she is feeling well and having rare palpitations. She states she feels less concerned about them since meeting with Dr. Acie Fredrickson and having him explain them. She will call to follow-up as needed in the future. I advised that I can only send a short-term supply of her medication since we will no longer be seeing her on a regular basis. I asked her to contact Dr. Nolon Rod, her PCP, for future refills. She verbalized understanding and agreement and thanked me for the call.

## 2019-08-28 ENCOUNTER — Telehealth: Payer: Self-pay | Admitting: Cardiovascular Disease

## 2019-08-28 MED ORDER — LISINOPRIL-HYDROCHLOROTHIAZIDE 20-12.5 MG PO TABS: 1.0000 | ORAL_TABLET | Freq: Every day | ORAL | 1 refills | Status: DC

## 2019-08-28 NOTE — Telephone Encounter (Signed)
°*  STAT* If patient is at the pharmacy, call can be transferred to refill team.   1. Which medications need to be refilled? (please list name of each medication and dose if known)  lisinopril-hydrochlorothiazide (ZESTORETIC) 20-12.5 MG tablet  2. Which pharmacy/location (including street and city if local pharmacy) is medication to be sent to? [place], [place]  3. Do they need a 30 day or 90 day supply? 90  Pt can not get an appt to see Dr. Acie Fredrickson until 09/03. Pt has been out of medication since June

## 2019-08-28 NOTE — Telephone Encounter (Signed)
**Note De-Identified Yuleidy Rappleye Obfuscation** I have e-scribed the pts Lisinopril-HCTZ RX to [facility] to fill #30 (with 1 refill) not #90 as requested as the pt must keep this appt to continue refills.

## 2019-08-29 MED FILL — LISINOPRIL-HCTZ 20-12.5 MG: 20-12.5 | 30 days supply | Qty: 30 | Fill #0

## 2019-08-30 ENCOUNTER — Ambulatory Visit (INDEPENDENT_AMBULATORY_CARE_PROVIDER_SITE_OTHER)
Admission: RE | Admit: 2019-08-30 | Discharge: 2019-08-30 | Disposition: A | Discharge status: Home / Self Care | Payer: No Typology Code available for payment source | Source: Ambulatory Visit

## 2019-08-30 DIAGNOSIS — R300 Dysuria: Secondary | ICD-10-CM | POA: Diagnosis not present

## 2019-08-30 MED ORDER — CEPHALEXIN 500 MG PO CAPS: 500.0000 mg | ORAL_CAPSULE | Freq: Two times a day (BID) | ORAL | 0 refills | Status: AC

## 2019-08-30 MED ORDER — FLUCONAZOLE 150 MG PO TABS: 150.0000 mg | ORAL_TABLET | Freq: Every day | ORAL | 0 refills | Status: DC

## 2019-08-30 MED FILL — CEPHALEXIN 500 MG CAPSULE: 500 | 5 days supply | Qty: 10 | Fill #0

## 2019-08-30 MED FILL — FLUCONAZOLE 150 MG TABS: 150 | 1 days supply | Qty: 1 | Fill #0

## 2019-08-30 NOTE — ED Provider Notes (Signed)
Virtual Visit via Video Note:  Kaylee Maxwell  initiated request for Telemedicine visit with [facility] team. I connected with Kaylee Maxwell  on 08/30/2019 at 1:41 PM  for a synchronized telemedicine visit using a video enabled HIPPA compliant telemedicine application. I verified that I am speaking with Kaylee Maxwell  using two identifiers. Kaylee Balloon, NP  was physically located in a [facility] Urgent care site and Kaylee Maxwell was located at a different location.   The limitations of evaluation and management by telemedicine as well as the availability of in-person appointments were discussed. Patient was informed that she  may incur a bill ( including co-pay) for this virtual visit encounter. Kaylee Maxwell  expressed understanding and gave verbal consent to proceed with virtual visit.     History of Present Illness:Kaylee Maxwell  is a 44 y.o. female presents for evaluation of dysuria x 4 days.  This feels like previous UTI.  She denies fever, chills, abdominal pain, back pain, vaginal discharge, pelvic pain, lesions, rash, or other symptoms.  Treatment attempted at home with increased water intake.  She denies current pregnancy or breastfeeding.      No Known Allergies   Past Medical History:  Diagnosis Date  . Anxiety 03/04/2018  . Breast cancer screening 03/04/2018  . Heart palpitations 03/04/2018  . Hypertension   . Irregular heartbeat 03/04/2018  . Scalp mass 10/09/2012     Social History   Tobacco Use  . Smoking status: Former Research scientist (life sciences)  . Smokeless tobacco: Never Used  Vaping Use  . Vaping Use: Every day  . Substances: Nicotine, Flavoring  Substance Use Topics  . Alcohol use: No  . Drug use: No    ROS: as stated in HPI.  All other systems reviewed and negative.      Observations/Objective: Physical Exam  VITALS: Patient denies fever. GENERAL: Alert, appears well and in no acute distress. HEENT: Atraumatic. Oral mucosa appears moist. NECK: Normal  movements of the head and neck. CARDIOPULMONARY: No increased WOB. Speaking in clear sentences. I:E ratio WNL.  MS: Moves all visible extremities without noticeable abnormality. PSYCH: Pleasant and cooperative, well-groomed. Speech normal rate and rhythm. Affect is appropriate. Insight and judgement are appropriate. Attention is focused, linear, and appropriate.  NEURO: CN grossly intact. Oriented as arrived to appointment on time with no prompting. Moves both UE equally.  SKIN: No obvious lesions, wounds, erythema, or cyanosis noted on face or hands.   Assessment and Plan:    ICD-10-CM   1. Dysuria  R30.0        Follow Up Instructions: Treating with Keflex.  Additionally prescribed Diflucan as patient reports she typically gets a yeast infection from antibiotics.  Instructed patient to follow-up with her PCP or come here to be seen in person if her symptoms do not resolve with this medication.  Patient agrees to plan of care.    I discussed the assessment and treatment plan with the patient. The patient was provided an opportunity to ask questions and all were answered. The patient agreed with the plan and demonstrated an understanding of the instructions.   The patient was advised to call back or seek an in-person evaluation if the symptoms worsen or if the condition fails to improve as anticipated.      Kaylee Balloon, NP  08/30/2019 1:41 PM         Kaylee Balloon, NP 08/30/19 1341

## 2019-08-30 NOTE — Discharge Instructions (Signed)
Take the Keflex and Diflucan as directed.   Follow up with your primary care provider or come here to be seen in person if your symptoms are not improving.

## 2019-09-05 ENCOUNTER — Other Ambulatory Visit: Payer: Self-pay

## 2019-09-05 ENCOUNTER — Other Ambulatory Visit: Payer: Self-pay | Admitting: Nurse Practitioner

## 2019-09-05 ENCOUNTER — Encounter: Payer: Self-pay | Admitting: Nurse Practitioner

## 2019-09-05 ENCOUNTER — Ambulatory Visit (INDEPENDENT_AMBULATORY_CARE_PROVIDER_SITE_OTHER): Payer: No Typology Code available for payment source | Admitting: Nurse Practitioner

## 2019-09-05 VITALS — BP 146/92 | HR 95 | Temp 97.7°F | Ht 65.0 in | Wt 200.0 lb

## 2019-09-05 DIAGNOSIS — Z79891 Long term (current) use of opiate analgesic: Secondary | ICD-10-CM

## 2019-09-05 DIAGNOSIS — R197 Diarrhea, unspecified: Secondary | ICD-10-CM

## 2019-09-05 DIAGNOSIS — D509 Iron deficiency anemia, unspecified: Secondary | ICD-10-CM

## 2019-09-05 DIAGNOSIS — H6121 Impacted cerumen, right ear: Secondary | ICD-10-CM

## 2019-09-05 DIAGNOSIS — I10 Essential (primary) hypertension: Secondary | ICD-10-CM

## 2019-09-05 DIAGNOSIS — E119 Type 2 diabetes mellitus without complications: Secondary | ICD-10-CM | POA: Diagnosis not present

## 2019-09-05 LAB — POCT GLYCOSYLATED HEMOGLOBIN (HGB A1C)
HbA1c POC (<> result, manual entry): 5.5 % (ref 4.0–5.6)
HbA1c, POC (controlled diabetic range): 5.5 % (ref 0.0–7.0)
HbA1c, POC (prediabetic range): 5.5 % — AB (ref 5.7–6.4)
Hemoglobin A1C: 5.5 % (ref 4.0–5.6)

## 2019-09-05 LAB — POCT URINALYSIS DIPSTICK
Bilirubin, UA: NEGATIVE
Glucose, UA: NEGATIVE
Ketones, UA: NEGATIVE
Leukocytes, UA: NEGATIVE
Nitrite, UA: NEGATIVE
Protein, UA: NEGATIVE
Spec Grav, UA: 1.025 (ref 1.010–1.025)
Urobilinogen, UA: 0.2 E.U./dL
pH, UA: 5.5 (ref 5.0–8.0)

## 2019-09-05 LAB — GLUCOSE, POCT (MANUAL RESULT ENTRY): POC Glucose: 113 mg/dl — AB (ref 70–99)

## 2019-09-05 MED ORDER — CLONIDINE HCL 0.1 MG PO TABS
0.2000 mg | ORAL_TABLET | Freq: Once | ORAL | Status: AC
  Administered 2019-09-05: 0.2 mg via ORAL

## 2019-09-05 MED ORDER — LISINOPRIL-HYDROCHLOROTHIAZIDE 20-25 MG PO TABS: 1.0000 | ORAL_TABLET | Freq: Every day | ORAL | 3 refills | Status: DC

## 2019-09-05 MED FILL — LISINOPRIL-HCTZ 20-25 MG TA: 20-25 | 90 days supply | Qty: 90 | Fill #0

## 2019-09-05 NOTE — Patient Instructions (Signed)
Health Maintenance, Female Adopting a healthy lifestyle and getting preventive care are important in promoting health and wellness. Ask your health care provider about:  The right schedule for you to have regular tests and exams.  Things you can do on your own to prevent diseases and keep yourself healthy. What should I know about diet, weight, and exercise? Eat a healthy diet   Eat a diet that includes plenty of vegetables, fruits, low-fat dairy products, and lean protein.  Do not eat a lot of foods that are high in solid fats, added sugars, or sodium. Maintain a healthy weight Body mass index (BMI) is used to identify weight problems. It estimates body fat based on height and weight. Your health care provider can help determine your BMI and help you achieve or maintain a healthy weight. Get regular exercise Get regular exercise. This is one of the most important things you can do for your health. Most adults should:  Exercise for at least 150 minutes each week. The exercise should increase your heart rate and make you sweat (moderate-intensity exercise).  Do strengthening exercises at least twice a week. This is in addition to the moderate-intensity exercise.  Spend less time sitting. Even light physical activity can be beneficial. Watch cholesterol and blood lipids Have your blood tested for lipids and cholesterol at 44 years of age, then have this test every 5 years. Have your cholesterol levels checked more often if:  Your lipid or cholesterol levels are high.  You are older than 44 years of age.  You are at high risk for heart disease. What should I know about cancer screening? Depending on your health history and family history, you may need to have cancer screening at various ages. This may include screening for:  Breast cancer.  Cervical cancer.  Colorectal cancer.  Skin cancer.  Lung cancer. What should I know about heart disease, diabetes, and high blood  pressure? Blood pressure and heart disease  High blood pressure causes heart disease and increases the risk of stroke. This is more likely to develop in people who have high blood pressure readings, are of African descent, or are overweight.  Have your blood pressure checked: ? Every 3-5 years if you are 18-39 years of age. ? Every year if you are 40 years old or older. Diabetes Have regular diabetes screenings. This checks your fasting blood sugar level. Have the screening done:  Once every three years after age 40 if you are at a normal weight and have a low risk for diabetes.  More often and at a younger age if you are overweight or have a high risk for diabetes. What should I know about preventing infection? Hepatitis B If you have a higher risk for hepatitis B, you should be screened for this virus. Talk with your health care provider to find out if you are at risk for hepatitis B infection. Hepatitis C Testing is recommended for:  Everyone born from 1945 through 1965.  Anyone with known risk factors for hepatitis C. Sexually transmitted infections (STIs)  Get screened for STIs, including gonorrhea and chlamydia, if: ? You are sexually active and are younger than 44 years of age. ? You are older than 44 years of age and your health care provider tells you that you are at risk for this type of infection. ? Your sexual activity has changed since you were last screened, and you are at increased risk for chlamydia or gonorrhea. Ask your health care provider if   you are at risk.  Ask your health care provider about whether you are at high risk for HIV. Your health care provider may recommend a prescription medicine to help prevent HIV infection. If you choose to take medicine to prevent HIV, you should first get tested for HIV. You should then be tested every 3 months for as long as you are taking the medicine. Pregnancy  If you are about to stop having your period (premenopausal) and  you may become pregnant, seek counseling before you get pregnant.  Take 400 to 800 micrograms (mcg) of folic acid every day if you become pregnant.  Ask for birth control (contraception) if you want to prevent pregnancy. Osteoporosis and menopause Osteoporosis is a disease in which the bones lose minerals and strength with aging. This can result in bone fractures. If you are 65 years old or older, or if you are at risk for osteoporosis and fractures, ask your health care provider if you should:  Be screened for bone loss.  Take a calcium or vitamin D supplement to lower your risk of fractures.  Be given hormone replacement therapy (HRT) to treat symptoms of menopause. Follow these instructions at home: Lifestyle  Do not use any products that contain nicotine or tobacco, such as cigarettes, e-cigarettes, and chewing tobacco. If you need help quitting, ask your health care provider.  Do not use street drugs.  Do not share needles.  Ask your health care provider for help if you need support or information about quitting drugs. Alcohol use  Do not drink alcohol if: ? Your health care provider tells you not to drink. ? You are pregnant, may be pregnant, or are planning to become pregnant.  If you drink alcohol: ? Limit how much you use to 0-1 drink a day. ? Limit intake if you are breastfeeding.  Be aware of how much alcohol is in your drink. In the [place] one drink equals one 12 oz bottle of beer (355 mL), one 5 oz glass of wine (148 mL), or one 1 oz glass of hard liquor (44 mL). General instructions  Schedule regular health, dental, and eye exams.  Stay current with your vaccines.  Tell your health care provider if: ? You often feel depressed. ? You have ever been abused or do not feel safe at home. Summary  Adopting a healthy lifestyle and getting preventive care are important in promoting health and wellness.  Follow your health care provider's instructions about healthy  diet, exercising, and getting tested or screened for diseases.  Follow your health care provider's instructions on monitoring your cholesterol and blood pressure. This information is not intended to replace advice given to you by your health care provider. Make sure you discuss any questions you have with your health care provider. Document Revised: 01/17/2018 Document Reviewed: 01/17/2018 Elsevier Patient Education  2020 [employer]  

## 2019-09-05 NOTE — Progress Notes (Signed)
[place],   [address] Phone:  [phone]   Fax:  [phone]   New Patient Office Visit  Subjective:  Patient ID: Kaylee Maxwell, female    DOB: 1975/12/05  Age: 44 y.o. MRN: [id number]  CC:  Chief Complaint  Patient presents with   Establish Care    Right ear uncomfortable, not hearing well. Wants to be tested for COVID antibodies.     HPI Kaylee Maxwell presents to establish care. She  has a past medical history of Anxiety (03/04/2018), Breast cancer screening (03/04/2018), Heart palpitations (03/04/2018), Hypertension, Irregular heartbeat (03/04/2018), and Scalp mass (10/09/2012).   She admits that she had a viral infection in the past and would like to be evaluated for Covid 19.  Hypertension Patient is here for follow-up of elevated blood pressure. She is not exercising and is not adherent to a low-salt diet. Blood pressure is not monitored at home. Cardiac symptoms: lower extremity edema. Headaches  Patient denies chest pain, dyspnea, fatigue, irregular heart beat, palpitations and syncope. Cardiovascular risk factors: hypertension, obesity (BMI >= 30 kg/m2) and sedentary lifestyle. Use of agents associated with hypertension: none. History of target organ damage: none.   She was lost and late   Diabetes Mellitus Patient presents for follow up of diabetes. Current symptoms include: none. Patient denies foot ulcerations, hyperglycemia, hypoglycemia , increased appetite, nausea, paresthesia of the feet, polydipsia, polyuria, visual disturbances, vomiting and weight loss. Evaluation to date has included: fasting blood sugar, fasting lipid panel and hemoglobin A1C.  Home sugars: patient does not check sugars. Current treatment: more intensive attention to diet which has been effective. Last dilated eye exam: 2019.  Cerumen Impaction Grizelda presents with diminished hearing in the right ear for the past 2 weeks. There is not a  prior history of cerumen impaction. The patient has not been using ear drops to loosen wax immediately prior to this visit. The patient complains of ear pain.  Past Medical History:  Diagnosis Date   Anxiety 03/04/2018   Breast cancer screening 03/04/2018   Heart palpitations 03/04/2018   Hypertension    Irregular heartbeat 03/04/2018   Scalp mass 10/09/2012    Past Surgical History:  Procedure Laterality Date   ANKLE FRACTURE SURGERY     cyst removal from scalp     left leg bone removal      Family History  Problem Relation Age of Onset   Cancer Paternal Aunt        pancreatic   Cancer Maternal Grandmother        lung   Autism spectrum disorder Sister     Social History   Socioeconomic History   Marital status: Single    Spouse name: Not on file   Number of children: 2   Years of education: Not on file   Highest education level: Associate degree: academic program  Occupational History   Not on file  Tobacco Use   Smoking status: Former Smoker   Smokeless tobacco: Never Used  Scientific laboratory technician Use: Every day   Substances: Nicotine, Flavoring  Substance and Sexual Activity   Alcohol use: Yes    Comment: occ   Drug use: No   Sexual activity: Yes    Birth control/protection: None  Other Topics Concern   Not on file  Social History Narrative   Not on file   Social Determinants of Health   Financial Resource Strain:    Difficulty of  Paying Living Expenses:   Food Insecurity:    Worried About Charity fundraiser in the Last Year:    Arboriculturist in the Last Year:   Transportation Needs:    Film/video editor (Medical):    Lack of Transportation (Non-Medical):   Physical Activity:    Days of Exercise per Week:    Minutes of Exercise per Session:   Stress:    Feeling of Stress :   Social Connections:    Frequency of Communication with Friends and Family:    Frequency of Social Gatherings with Friends and Family:      Attends Religious Services:    Active Member of Clubs or Organizations:    Attends Music therapist:    Marital Status:   Intimate Partner Violence:    Fear of Current or Ex-Partner:    Emotionally Abused:    Physically Abused:    Sexually Abused:     ROS Review of Systems  Constitutional: Negative.   HENT: Negative.   Eyes: Negative.   Respiratory: Negative.   Cardiovascular: Positive for leg swelling.       Aching in both ankles   Gastrointestinal: Positive for diarrhea.       Normal bm water stool   Endocrine: Negative.   Genitourinary: Negative.        Recent uti completed treatment  Musculoskeletal: Positive for joint swelling.  Skin:       Knot under left arm   Allergic/Immunologic: Negative.   Neurological: Negative.   Hematological: Bruises/bleeds easily.  Psychiatric/Behavioral: Negative.     Objective:   Today's Vitals: BP (!) 146/92 Comment: manually   Pulse 95    Temp 97.7 F (36.5 C)    Ht 5' 5"  (1.651 m)    Wt 200 lb (90.7 kg)    LMP 08/31/2019    SpO2 100%    BMI 33.28 kg/m   Physical Exam  Assessment & Plan:   Problem List Items Addressed This Visit    None    Visit Diagnoses    Essential hypertension    -  Primary Lisinopril HCT 20/12.5 was increased to lisinopril HCTZ 20/20 mg daily Encourage home monitoring and recording BP <130/80 Eating a heart-healthy dies with less salt Encouraged regular physical activity  Recommend Weight loss Encouraged on going compliance with current medication regimen    Relevant Medications   lisinopril-hydrochlorothiazide (ZESTORETIC) 20-25 MG tablet   cloNIDine (CATAPRES) tablet 0.2 mg (Completed)   Other Relevant Orders   Microalbumin / creatinine urine ratio   Type 2 diabetes mellitus without complication, without long-term current use of insulin (HCC)        Encourage regular CBG monitoring Encourage contacting office if excessive hyperglycemia and or hypoglycemia Lifestyle  modification with healthy diet (fewer calories, more high fiber foods, whole grains and non-starchy vegetables, lower fat meat and fish, low-fat diary include healthy oils) regular exercise (physical activity) and weight loss Opthalmology exam discussed  Nutritional consult recommended Regular dental visits encouraged Home BP monitoring also encouraged goal <130/80 Relevant Medications   lisinopril-hydrochlorothiazide (ZESTORETIC) 20-25 MG tablet   Other Relevant Orders   Comp. Metabolic Panel (12)   Lipid panel   Urinalysis Dipstick (Completed)   HgB A1c (Completed)   Glucose (CBG) (Completed)   Microalbumin / creatinine urine ratio   Diarrhea, unspecified type       Relevant Orders   Cdiff NAA+O+P+Stool Culture   Long term prescription opiate use  Relevant Orders   Lipid panel   Iron deficiency anemia, unspecified iron deficiency anemia type       Relevant Orders   CBC with Differential/Platelet   Cerumen debris on tympanic membrane of right ear   In office ear wash was not completely successful Patient to use Debrox at home as directed follow-up if symptoms persist or get worse for reevaluation      Outpatient Encounter Medications as of 09/05/2019  Medication Sig   aspirin EC 81 MG tablet Take 1 tablet (81 mg total) by mouth daily.   ferrous sulfate 325 (65 FE) MG EC tablet Take 325 mg by mouth daily with breakfast.   hydrocortisone 2.5 % cream Apply 1 application topically as needed.   [DISCONTINUED] lisinopril-hydrochlorothiazide (ZESTORETIC) 20-12.5 MG tablet Take 1 tablet by mouth daily. MUST KEEP F/U SCHEDULED ON 10/11/19 WITH DR NAHSER TO CONT REFILLS (if, not your primary MD will need to fill). Thank you.   lisinopril-hydrochlorothiazide (ZESTORETIC) 20-25 MG tablet Take 1 tablet by mouth daily.   traZODone (DESYREL) 50 MG tablet Take 1 tablet (50 mg total) by mouth at bedtime as needed for sleep.   [DISCONTINUED] fluconazole (DIFLUCAN) 150 MG tablet Take 1  tablet (150 mg total) by mouth daily. Take one tablet today.   [DISCONTINUED] metroNIDAZOLE (FLAGYL) 500 MG tablet Take 1 tablet (500 mg total) by mouth 2 (two) times daily.   [DISCONTINUED] nicotine (NICODERM CQ) 7 mg/24hr patch Place 1 patch (7 mg total) onto the skin daily. (Patient not taking: Reported on 09/05/2019)   [DISCONTINUED] norethindrone (ORTHO MICRONOR) 0.35 MG tablet Take 1 tablet (0.35 mg total) by mouth daily. (Patient not taking: Reported on 09/05/2019)   [DISCONTINUED] pravastatin (PRAVACHOL) 10 MG tablet Take 1 tablet (10 mg total) by mouth daily.   [EXPIRED] cloNIDine (CATAPRES) tablet 0.2 mg    No facility-administered encounter medications on file as of 09/05/2019.    Follow-up: Return for well woman physcial with in the next week or so .    Vevelyn Francois, NP

## 2019-09-06 LAB — COMP. METABOLIC PANEL (12)
AST: 125 IU/L — ABNORMAL HIGH (ref 0–40)
Albumin/Globulin Ratio: 1.3 (ref 1.2–2.2)
Albumin: 4.3 g/dL (ref 3.8–4.8)
Alkaline Phosphatase: 96 IU/L (ref 48–121)
BUN/Creatinine Ratio: 12 (ref 9–23)
BUN: 10 mg/dL (ref 6–24)
Bilirubin Total: 0.5 mg/dL (ref 0.0–1.2)
Calcium: 10.2 mg/dL (ref 8.7–10.2)
Chloride: 98 mmol/L (ref 96–106)
Creatinine, Ser: 0.82 mg/dL (ref 0.57–1.00)
GFR calc Af Amer: 101 mL/min/{1.73_m2} (ref >59)
GFR calc non Af Amer: 87 mL/min/{1.73_m2} (ref >59)
Globulin, Total: 3.3 g/dL (ref 1.5–4.5)
Glucose: 105 mg/dL — ABNORMAL HIGH (ref 65–99)
Potassium: 3.6 mmol/L (ref 3.5–5.2)
Sodium: 139 mmol/L (ref 134–144)
Total Protein: 7.6 g/dL (ref 6.0–8.5)

## 2019-09-06 LAB — MICROALBUMIN / CREATININE URINE RATIO
Creatinine, Urine: 108 mg/dL
Microalb/Creat Ratio: 37 mg/g creat — ABNORMAL HIGH (ref 0–29)
Microalbumin, Urine: 39.7 ug/mL

## 2019-09-06 LAB — CBC WITH DIFFERENTIAL/PLATELET
Basophils Absolute: 0 10*3/uL (ref 0.0–0.2)
Basos: 1 %
EOS (ABSOLUTE): 0.1 10*3/uL (ref 0.0–0.4)
Eos: 2 %
Hematocrit: 39 % (ref 34.0–46.6)
Hemoglobin: 12.8 g/dL (ref 11.1–15.9)
Immature Grans (Abs): 0 10*3/uL (ref 0.0–0.1)
Immature Granulocytes: 0 %
Lymphocytes Absolute: 1.6 10*3/uL (ref 0.7–3.1)
Lymphs: 25 %
MCH: 29.8 pg (ref 26.6–33.0)
MCHC: 32.8 g/dL (ref 31.5–35.7)
MCV: 91 fL (ref 79–97)
Monocytes Absolute: 0.8 10*3/uL (ref 0.1–0.9)
Monocytes: 13 %
Neutrophils Absolute: 3.8 10*3/uL (ref 1.4–7.0)
Neutrophils: 59 %
Platelets: 366 10*3/uL (ref 150–450)
RBC: 4.29 x10E6/uL (ref 3.77–5.28)
RDW: 13 % (ref 11.7–15.4)
WBC: 6.4 10*3/uL (ref 3.4–10.8)

## 2019-09-06 LAB — LIPID PANEL
Chol/HDL Ratio: 4.1 ratio (ref 0.0–4.4)
Cholesterol, Total: 220 mg/dL — ABNORMAL HIGH (ref 100–199)
HDL: 54 mg/dL (ref >39)
LDL Chol Calc (NIH): 139 mg/dL — ABNORMAL HIGH (ref 0–99)
Triglycerides: 149 mg/dL (ref 0–149)
VLDL Cholesterol Cal: 27 mg/dL (ref 5–40)

## 2019-09-09 ENCOUNTER — Other Ambulatory Visit: Payer: Self-pay

## 2019-09-09 MED ORDER — METRONIDAZOLE 500 MG PO TABS
500.0000 mg | ORAL_TABLET | Freq: Three times a day (TID) | ORAL | 0 refills | Status: DC
Start: 2019-09-09 — End: 2019-10-18

## 2019-09-09 MED FILL — METRONIDAZOLE 500 MG TABS: 500 | 10 days supply | Qty: 30 | Fill #0

## 2019-09-11 LAB — CDIFF NAA+O+P+STOOL CULTURE
E coli, Shiga toxin Assay: NEGATIVE
Toxigenic C. Difficile by PCR: POSITIVE — AB

## 2019-09-25 ENCOUNTER — Encounter: Payer: Self-pay | Admitting: Nurse Practitioner

## 2019-09-25 ENCOUNTER — Other Ambulatory Visit: Payer: Self-pay

## 2019-09-25 ENCOUNTER — Ambulatory Visit (INDEPENDENT_AMBULATORY_CARE_PROVIDER_SITE_OTHER): Payer: No Typology Code available for payment source | Admitting: Nurse Practitioner

## 2019-09-25 VITALS — BP 120/85 | HR 100 | Temp 97.7°F | Ht 65.0 in | Wt 197.6 lb

## 2019-09-25 DIAGNOSIS — N898 Other specified noninflammatory disorders of vagina: Secondary | ICD-10-CM | POA: Diagnosis not present

## 2019-09-25 DIAGNOSIS — H6121 Impacted cerumen, right ear: Secondary | ICD-10-CM | POA: Diagnosis not present

## 2019-09-25 DIAGNOSIS — Z0000 Encounter for general adult medical examination without abnormal findings: Secondary | ICD-10-CM

## 2019-09-25 NOTE — Patient Instructions (Signed)
Earwax Buildup, Adult The ears produce a substance called earwax that helps keep bacteria out of the ear and protects the skin in the ear canal. Occasionally, earwax can build up in the ear and cause discomfort or hearing loss. What increases the risk? This condition is more likely to develop in people who:  Are female.  Are elderly.  Naturally produce more earwax.  Clean their ears often with cotton swabs.  Use earplugs often.  Use in-ear headphones often.  Wear hearing aids.  Have narrow ear canals.  Have earwax that is overly thick or sticky.  Have eczema.  Are dehydrated.  Have excess hair in the ear canal. What are the signs or symptoms? Symptoms of this condition include:  Reduced or muffled hearing.  A feeling of fullness in the ear or feeling that the ear is plugged.  Fluid coming from the ear.  Ear pain.  Ear itch.  Ringing in the ear.  Coughing.  An obvious piece of earwax that can be seen inside the ear canal. How is this diagnosed? This condition may be diagnosed based on:  Your symptoms.  Your medical history.  An ear exam. During the exam, your health care provider will look into your ear with an instrument called an otoscope. You may have tests, including a hearing test. How is this treated? This condition may be treated by:  Using ear drops to soften the earwax.  Having the earwax removed by a health care provider. The health care provider may: ? Flush the ear with water. ? Use an instrument that has a loop on the end (curette). ? Use a suction device.  Surgery to remove the wax buildup. This may be done in severe cases. Follow these instructions at home:   Take over-the-counter and prescription medicines only as told by your health care provider.  Do not put any objects, including cotton swabs, into your ear. You can clean the opening of your ear canal with a washcloth or facial tissue.  Follow instructions from your health care  provider about cleaning your ears. Do not over-clean your ears.  Drink enough fluid to keep your urine clear or pale yellow. This will help to thin the earwax.  Keep all follow-up visits as told by your health care provider. If earwax builds up in your ears often or if you use hearing aids, consider seeing your health care provider for routine, preventive ear cleanings. Ask your health care provider how often you should schedule your cleanings.  If you have hearing aids, clean them according to instructions from the manufacturer and your health care provider. Contact a health care provider if:  You have ear pain.  You develop a fever.  You have blood, pus, or other fluid coming from your ear.  You have hearing loss.  You have ringing in your ears that does not go away.  Your symptoms do not improve with treatment.  You feel like the room is spinning (vertigo). Summary  Earwax can build up in the ear and cause discomfort or hearing loss.  The most common symptoms of this condition include reduced or muffled hearing and a feeling of fullness in the ear or feeling that the ear is plugged.  This condition may be diagnosed based on your symptoms, your medical history, and an ear exam.  This condition may be treated by using ear drops to soften the earwax or by having the earwax removed by a health care provider.  Do not put any  objects, including cotton swabs, into your ear. You can clean the opening of your ear canal with a washcloth or facial tissue. This information is not intended to replace advice given to you by your health care provider. Make sure you discuss any questions you have with your health care provider. Document Revised: 01/06/2017 Document Reviewed: 04/06/2016 Elsevier Patient Education  2020 [employer].

## 2019-09-25 NOTE — Progress Notes (Signed)
Kaylee [place], [place]  [address] Phone:  [phone]   Fax:  [phone]   Established Patient Office Visit  Subjective:  Patient ID: Kaylee Maxwell, female    DOB: 1976/01/01  Age: 44 y.o. MRN: [id number]  CC:  Chief Complaint  Patient presents with  . Annual Exam    having vaginal burning itching, took diflucan tablet with little relief    HPI Kaylee Maxwell presents for breast exam .  She was to have her annual exam but is currently on her cycle. She  has a past medical history of Anxiety (03/04/2018), Breast cancer screening (03/04/2018), Heart palpitations (03/04/2018), Hypertension, Irregular heartbeat (03/04/2018), and Scalp mass (10/09/2012).  Vaginitis Patient presents for evaluation of an abnormal vaginal discharge. Symptoms have been present for a few days. Vaginal symptoms: local irritation. Contraception: none. She denies abnormal bleeding, blisters, bumps, discharge, lesions, odor, tears and warts. Sexually transmitted infection risk: very low risk of STD exposure. Menstrual flow: regular every 28-30 days. She was recently treated with Flagyl for C. difficile in her stool.  She admits that this has been an ongoing problem with diarrhea that she had accepted as her normal bowel pattern.  Past Medical History:  Diagnosis Date  . Anxiety 03/04/2018  . Breast cancer screening 03/04/2018  . Heart palpitations 03/04/2018  . Hypertension   . Irregular heartbeat 03/04/2018  . Scalp mass 10/09/2012    Past Surgical History:  Procedure Laterality Date  . ANKLE FRACTURE SURGERY    . cyst removal from scalp    . left leg bone removal      Family History  Problem Relation Age of Onset  . Cancer Paternal Aunt        pancreatic  . Cancer Maternal Grandmother        lung  . Autism spectrum disorder Sister     Social History   Socioeconomic History  . Marital status: Single    Spouse name: Not on file  . Number of children: 2  .  Years of education: Not on file  . Highest education level: Associate degree: academic program  Occupational History  . Not on file  Tobacco Use  . Smoking status: Former Research scientist (life sciences)  . Smokeless tobacco: Never Used  Vaping Use  . Vaping Use: Every day  . Substances: Nicotine, Flavoring  Substance and Sexual Activity  . Alcohol use: Yes    Comment: occ  . Drug use: No  . Sexual activity: Yes    Birth control/protection: None  Other Topics Concern  . Not on file  Social History Narrative  . Not on file   Social Determinants of Health   Financial Resource Strain:   . Difficulty of Paying Living Expenses:   Food Insecurity:   . Worried About Charity fundraiser in the Last Year:   . Arboriculturist in the Last Year:   Transportation Needs:   . Film/video editor (Medical):   Marland Kitchen Lack of Transportation (Non-Medical):   Physical Activity:   . Days of Exercise per Week:   . Minutes of Exercise per Session:   Stress:   . Feeling of Stress :   Social Connections:   . Frequency of Communication with Friends and Family:   . Frequency of Social Gatherings with Friends and Family:   . Attends Religious Services:   . Active Member of Clubs or Organizations:   . Attends Archivist Meetings:   .  Marital Status:   Intimate Partner Violence:   . Fear of Current or Ex-Partner:   . Emotionally Abused:   Marland Kitchen Physically Abused:   . Sexually Abused:     Outpatient Medications Prior to Visit  Medication Sig Dispense Refill  . aspirin EC 81 MG tablet Take 1 tablet (81 mg total) by mouth daily.    . ferrous sulfate 325 (65 FE) MG EC tablet Take 325 mg by mouth daily with breakfast.    . hydrocortisone 2.5 % cream Apply 1 application topically as needed.    Marland Kitchen lisinopril-hydrochlorothiazide (ZESTORETIC) 20-25 MG tablet Take 1 tablet by mouth daily. 90 tablet 3  . metroNIDAZOLE (FLAGYL) 500 MG tablet Take 1 tablet (500 mg total) by mouth 3 (three) times daily. 30 tablet 0  .  traZODone (DESYREL) 50 MG tablet Take 1 tablet (50 mg total) by mouth at bedtime as needed for sleep. 90 tablet 1   No facility-administered medications prior to visit.    No Known Allergies  ROS Review of Systems  All other systems reviewed and are negative.     Objective:    Physical Exam Exam conducted with a chaperone present.  Chest:     Breasts:        Right: Tenderness present. No nipple discharge or skin change.        Left: Tenderness present. No nipple discharge or skin change.     BP 120/85   Pulse 100   Temp 97.7 F (36.5 C) (Temporal)   Ht 5' 5"  (1.651 m)   Wt 197 lb 9.6 oz (89.6 kg)   LMP 08/31/2019   SpO2 100%   BMI 32.88 kg/m  Wt Readings from Last 3 Encounters:  09/25/19 197 lb 9.6 oz (89.6 kg)  09/05/19 200 lb (90.7 kg)  08/29/18 192 lb 6.4 oz (87.3 kg)     Health Maintenance Due  Topic Date Due  . Hepatitis C Screening  Never done  . COVID-19 Vaccine (1) Never done  . OPHTHALMOLOGY EXAM  09/28/2018  . FOOT EXAM  08/29/2019  . INFLUENZA VACCINE  09/08/2019    There are no preventive care reminders to display for this patient.  Lab Results  Component Value Date   TSH 0.779 03/19/2018   Lab Results  Component Value Date   WBC 6.4 09/05/2019   HGB 12.8 09/05/2019   HCT 39.0 09/05/2019   MCV 91 09/05/2019   PLT 366 09/05/2019   Lab Results  Component Value Date   NA 139 09/05/2019   K 3.6 09/05/2019   CO2 22 08/29/2018   GLUCOSE 105 (H) 09/05/2019   BUN 10 09/05/2019   CREATININE 0.82 09/05/2019   BILITOT 0.5 09/05/2019   ALKPHOS 96 09/05/2019   AST 125 (H) 09/05/2019   ALT 43 (H) 08/29/2018   PROT 7.6 09/05/2019   ALBUMIN 4.3 09/05/2019   CALCIUM 10.2 09/05/2019   Lab Results  Component Value Date   CHOL 220 (H) 09/05/2019   Lab Results  Component Value Date   HDL 54 09/05/2019   Lab Results  Component Value Date   LDLCALC 139 (H) 09/05/2019   Lab Results  Component Value Date   TRIG 149 09/05/2019   Lab  Results  Component Value Date   CHOLHDL 4.1 09/05/2019   Lab Results  Component Value Date   HGBA1C 5.5 09/05/2019   HGBA1C 5.5 09/05/2019   HGBA1C 5.5 (A) 09/05/2019   HGBA1C 5.5 09/05/2019      Assessment &  Plan:   Problem List Items Addressed This Visit    None    Visit Diagnoses    Vaginal irritation    -  Primary Nuswab pending treatment based on results   Relevant Orders   NuSwab Vaginitis (VG)   Cerumen debris on tympanic membrane of right ear     ENT referral due to the lack of equipment.  Previous ear wash was ineffective.  Over-the-counter reflux was also ineffective.   Relevant Orders   Ambulatory referral to ENT   Healthcare maintenance       Relevant Orders   MM DIGITAL SCREENING BILATERAL      No orders of the defined types were placed in this encounter.   Follow-up: Return in about 3 months (around 12/26/2019) for PAP.    Vevelyn Francois, NP

## 2019-09-28 LAB — NUSWAB VAGINITIS (VG)
Candida albicans, NAA: NEGATIVE
Candida glabrata, NAA: NEGATIVE
Trich vag by NAA: NEGATIVE

## 2019-10-11 ENCOUNTER — Ambulatory Visit: Payer: No Typology Code available for payment source | Admitting: Cardiovascular Disease

## 2019-10-17 NOTE — Progress Notes (Addendum)
Virtual Visit via Video Note   This visit type was conducted due to national recommendations for restrictions regarding the COVID-19 Pandemic (e.g. social distancing) in an effort to limit this patient's exposure and mitigate transmission in our community.  Due to her co-morbid illnesses, this patient is at least at moderate risk for complications without adequate follow up.  This format is felt to be most appropriate for this patient at this time.  All issues noted in this document were discussed and addressed.  A limited physical exam was performed with this format.  Please refer to the patient's chart for her consent to telehealth for [facility].       Date:  10/18/2019   ID:  Kaylee Maxwell, DOB 04-29-1975, MRN [id number] The patient was identified using 2 identifiers.  Patient Location: Home Provider Location: Office/Clinic  PCP:  Vevelyn Francois, NP  Cardiologist:  Mertie Moores, MD   Electrophysiologist:  None   Evaluation Performed:  Follow-Up Visit  Chief Complaint:  Follow-up (Palpitations)    Patient Profile: Kaylee Maxwell is a 44 y.o. female with:  Palpitations  PVCs  Hypertension  Anxiety  Ex-smoker   Prior CV Studies: None   The 10-year ASCVD risk score Mikey Bussing DC Brooke Bonito., et al., 2013) is: 17.1%   Values used to calculate the score:     Age: 21 years     Sex: Female     Is Non-Hispanic African American: Yes     Diabetic: Yes     Tobacco smoker: Yes     Systolic Blood Pressure: 696 mmHg     Is BP treated: Yes     HDL Cholesterol: 54 mg/dL     Total Cholesterol: 220 mg/dL    History of Present Illness:   Kaylee Maxwell was evaluated by Dr. Acie Fredrickson in 03/2018 for palpitations.  PVCs were noted on her electrocardiogram lifestyle recommendations were initially recommended with an option to start beta-blocker therapy if this was unsuccessful.   Today, she notes she is doing well.  She continues to have palpitations but these are improved.  She has changed  her diet, reduce caffeine and has quit smoking.  She has not had chest discomfort, significant shortness of breath, syncope, orthopnea, leg swelling.  Past Medical History:  Diagnosis Date  . Anxiety 03/04/2018  . Breast cancer screening 03/04/2018  . Heart palpitations 03/04/2018  . Hypertension   . Irregular heartbeat 03/04/2018  . Scalp mass 10/09/2012   Past Surgical History:  Procedure Laterality Date  . ANKLE FRACTURE SURGERY    . cyst removal from scalp    . left leg bone removal       Current Meds  Medication Sig  . ferrous sulfate 325 (65 FE) MG EC tablet Take 325 mg by mouth daily with breakfast.  . lisinopril-hydrochlorothiazide (ZESTORETIC) 20-25 MG tablet Take 1 tablet by mouth daily.  . Multiple Vitamin (MULTIVITAMIN) tablet Take 1 tablet by mouth daily.  . traZODone (DESYREL) 50 MG tablet Take 1 tablet (50 mg total) by mouth at bedtime as needed for sleep.     Allergies:   Patient has no known allergies.   Social History   Tobacco Use  . Smoking status: Former Research scientist (life sciences)  . Smokeless tobacco: Never Used  Vaping Use  . Vaping Use: Every day  . Substances: Nicotine, Flavoring  Substance Use Topics  . Alcohol use: Yes    Comment: occ  . Drug use: No     Family Hx: The patient's family  history includes Autism spectrum disorder in her sister; Cancer in her maternal grandmother and paternal aunt.  ROS:   Please see the history of present illness.      Labs/Other Tests and Data Reviewed:    EKG:  An ECG dated 03/03/2018 was personally reviewed today and demonstrated:  Normal sinus rhythm, heart rate 79, normal axis, no ST-T wave changes, QTC 468, PVCs  Recent Labs: 09/05/2019: BUN 10; Creatinine, Ser 0.82; Hemoglobin 12.8; Platelets 366; Potassium 3.6; Sodium 139   Recent Lipid Panel Lab Results  Component Value Date/Time   CHOL 220 (H) 09/05/2019 11:20 AM   TRIG 149 09/05/2019 11:20 AM   HDL 54 09/05/2019 11:20 AM   CHOLHDL 4.1 09/05/2019 11:20 AM   LDLCALC  139 (H) 09/05/2019 11:20 AM    Wt Readings from Last 3 Encounters:  10/18/19 197 lb (89.4 kg)  09/25/19 197 lb 9.6 oz (89.6 kg)  09/05/19 200 lb (90.7 kg)     Objective:    Vital Signs:  BP (!) 141/90   Pulse (!) 109   Ht 5' 5"  (1.651 m)   Wt 197 lb (89.4 kg)   BMI 32.78 kg/m    VITAL SIGNS:  reviewed GEN:  no acute distress RESPIRATORY:  normal respiratory effort, symmetric expansion NEURO:  alert and oriented x 3, no obvious focal deficit PSYCH:  normal affect  ASSESSMENT & PLAN:    1. PVC's (premature ventricular contractions) 2. Tachycardia Her heart rate today is 109.  She has a history of PVCs he continues to feel these.  Although, they are improved.  I have recommended proceeding with a 3-day ZIO patch monitor.  This will help determine if she is in sinus rhythm as well as to determine her PVC burden.  If she has a high PVC burden, we will need to obtain an echocardiogram to rule out PVC induced cardiomyopathy.  Her blood pressure is above target.  She notes better blood pressures than this at times.  Her PCP adjusted her blood pressure medication in July.  If her blood pressure remains above target, I would recommend starting metoprolol succinate.  3. Essential hypertension As noted, her blood pressure is above target.  She does note better blood pressures at times and she had an optimal blood pressure at her last visit in August with her PCP.  Continue to monitor.  Continue current dose of lisinopril/HCTZ.  4. Elevated LFTs Her recent AST was 125.  She notes that she was drinking significant amounts of alcohol.  She has now cut back on that.  Continue follow-up with primary care.  5. Pure hypercholesterolemia 10-year ASCVD risk is 3.9%.  Continue dietary management and follow-up with primary care.     Time:   Today, I have spent 13 minutes with the patient with telehealth technology discussing the above problems.     Medication Adjustments/Labs and Tests  Ordered: Current medicines are reviewed at length with the patient today.  Concerns regarding medicines are outlined above.   Tests Ordered: Orders Placed This Encounter  Procedures  . LONG TERM MONITOR (3-14 DAYS)    Medication Changes: No orders of the defined types were placed in this encounter.   Follow Up:  In Person in 6 month(s)  Signed, Richardson Dopp, PA-C  10/18/2019 9:51 AM    [place]

## 2019-10-18 ENCOUNTER — Telehealth: Payer: Self-pay | Admitting: *Deleted

## 2019-10-18 ENCOUNTER — Telehealth: Payer: Self-pay | Admitting: Radiology

## 2019-10-18 ENCOUNTER — Other Ambulatory Visit: Payer: Self-pay

## 2019-10-18 ENCOUNTER — Telehealth (INDEPENDENT_AMBULATORY_CARE_PROVIDER_SITE_OTHER): Payer: No Typology Code available for payment source | Admitting: Physician Assistant

## 2019-10-18 ENCOUNTER — Encounter: Payer: Self-pay | Admitting: Physician Assistant

## 2019-10-18 VITALS — BP 141/90 | HR 109 | Ht 65.0 in | Wt 197.0 lb

## 2019-10-18 DIAGNOSIS — R7989 Other specified abnormal findings of blood chemistry: Secondary | ICD-10-CM | POA: Diagnosis not present

## 2019-10-18 DIAGNOSIS — I10 Essential (primary) hypertension: Secondary | ICD-10-CM | POA: Diagnosis not present

## 2019-10-18 DIAGNOSIS — R000 Tachycardia, unspecified: Secondary | ICD-10-CM

## 2019-10-18 DIAGNOSIS — I493 Ventricular premature depolarization: Secondary | ICD-10-CM | POA: Diagnosis not present

## 2019-10-18 DIAGNOSIS — E7800 Pure hypercholesterolemia, unspecified: Secondary | ICD-10-CM

## 2019-10-18 NOTE — Telephone Encounter (Signed)
Enrolled patient for a 3 day Zio XT monitor to be mailed to patients  Home.

## 2019-10-18 NOTE — Patient Instructions (Signed)
Medication Instructions:  Your physician recommends that you continue on your current medications as directed. Please refer to the Current Medication list given to you today.  *If you need a refill on your cardiac medications before your next appointment, please call your pharmacy*  Lab Work: None ordered today  Testing/Procedures: A zio monitor was ordered today. It will remain on for 3 days. You will then return monitor and event diary in provided box. It takes 1-2 weeks for report to be downloaded and returned to [place]. We will call you with the results. If monitor falls off or has orange flashing light, please call Zio for further instructions.   Follow-Up: On 04/17/2020 at 2:00PM with Mertie Moores, MD  Other Instructions Monitor blood pressure and notify us if 140/90 or higher, consistently

## 2019-10-18 NOTE — Telephone Encounter (Signed)
  Patient Consent for Virtual Visit         Kaylee Maxwell has provided verbal consent on 10/18/2019 for a virtual visit (video or telephone).   CONSENT FOR VIRTUAL VISIT FOR:  Kaylee Maxwell  By participating in this virtual visit I agree to the following:  I hereby voluntarily request, consent and authorize [facility] and its employed or contracted physicians, physician assistants, nurse practitioners or other licensed health care professionals (the Practitioner), to provide me with telemedicine health care services (the "Services") as deemed necessary by the treating Practitioner. I acknowledge and consent to receive the Services by the Practitioner via telemedicine. I understand that the telemedicine visit will involve communicating with the Practitioner through live audiovisual communication technology and the disclosure of certain medical information by electronic transmission. I acknowledge that I have been given the opportunity to request an in-person assessment or other available alternative prior to the telemedicine visit and am voluntarily participating in the telemedicine visit.  I understand that I have the right to withhold or withdraw my consent to the use of telemedicine in the course of my care at any time, without affecting my right to future care or treatment, and that the Practitioner or I may terminate the telemedicine visit at any time. I understand that I have the right to inspect all information obtained and/or recorded in the course of the telemedicine visit and may receive copies of available information for a reasonable fee.  I understand that some of the potential risks of receiving the Services via telemedicine include:  Marland Kitchen Delay or interruption in medical evaluation due to technological equipment failure or disruption; . Information transmitted may not be sufficient (e.g. poor resolution of images) to allow for appropriate medical decision making by the Practitioner;  and/or  . In rare instances, security protocols could fail, causing a breach of personal health information.  Furthermore, I acknowledge that it is my responsibility to provide information about my medical history, conditions and care that is complete and accurate to the best of my ability. I acknowledge that Practitioner's advice, recommendations, and/or decision may be based on factors not within their control, such as incomplete or inaccurate data provided by me or distortions of diagnostic images or specimens that may result from electronic transmissions. I understand that the practice of medicine is not an exact science and that Practitioner makes no warranties or guarantees regarding treatment outcomes. I acknowledge that a copy of this consent can be made available to me via my patient portal ([facility]), or I can request a printed copy by calling the office of [facility].    I understand that my insurance will be billed for this visit.   I have read or had this consent read to me. . I understand the contents of this consent, which adequately explains the benefits and risks of the Services being provided via telemedicine.  . I have been provided ample opportunity to ask questions regarding this consent and the Services and have had my questions answered to my satisfaction. . I give my informed consent for the services to be provided through the use of telemedicine in my medical care

## 2019-10-23 ENCOUNTER — Other Ambulatory Visit (INDEPENDENT_AMBULATORY_CARE_PROVIDER_SITE_OTHER): Payer: No Typology Code available for payment source

## 2019-10-23 DIAGNOSIS — R000 Tachycardia, unspecified: Secondary | ICD-10-CM | POA: Diagnosis not present

## 2019-10-23 DIAGNOSIS — I493 Ventricular premature depolarization: Secondary | ICD-10-CM | POA: Diagnosis not present

## 2019-10-24 ENCOUNTER — Telehealth: Payer: Self-pay | Admitting: Cardiovascular Disease

## 2019-10-24 NOTE — Telephone Encounter (Signed)
Patient put on her monitor yesterday, but she has some questions about it. Like taking a bath, sleeping, and when to return it.

## 2019-10-24 NOTE — Telephone Encounter (Signed)
Answered patients questions regarding 3 day ZIO XT long term holter monitor.

## 2019-11-04 ENCOUNTER — Encounter (INDEPENDENT_AMBULATORY_CARE_PROVIDER_SITE_OTHER): Payer: Self-pay | Admitting: Otolaryngology

## 2019-11-04 ENCOUNTER — Other Ambulatory Visit: Payer: Self-pay

## 2019-11-04 ENCOUNTER — Ambulatory Visit (INDEPENDENT_AMBULATORY_CARE_PROVIDER_SITE_OTHER): Payer: No Typology Code available for payment source | Admitting: Otolaryngology

## 2019-11-04 VITALS — Temp 97.7°F

## 2019-11-04 DIAGNOSIS — H6121 Impacted cerumen, right ear: Secondary | ICD-10-CM | POA: Diagnosis not present

## 2019-11-04 NOTE — Progress Notes (Signed)
HPI: Kaylee Maxwell is a 44 y.o. female who presents for evaluation of cerumen buildup in the right ear canal referred by her PCP.  She uses Q-tips and was having some discomfort when she put a Q-tip in her right ear as well as feeling blocked in the right ear..  Past Medical History:  Diagnosis Date  . Anxiety 03/04/2018  . Breast cancer screening 03/04/2018  . Heart palpitations 03/04/2018  . Hypertension   . Irregular heartbeat 03/04/2018  . Scalp mass 10/09/2012   Past Surgical History:  Procedure Laterality Date  . ANKLE FRACTURE SURGERY    . cyst removal from scalp    . left leg bone removal     Social History   Socioeconomic History  . Marital status: Single    Spouse name: Not on file  . Number of children: 2  . Years of education: Not on file  . Highest education level: Associate degree: academic program  Occupational History  . Not on file  Tobacco Use  . Smoking status: Former Smoker    Packs/day: 0.50    Years: 18.00    Pack years: 9.00  . Smokeless tobacco: Never Used  Vaping Use  . Vaping Use: Every day  . Substances: Nicotine, Flavoring  Substance and Sexual Activity  . Alcohol use: Yes    Comment: occ  . Drug use: No  . Sexual activity: Yes    Birth control/protection: None  Other Topics Concern  . Not on file  Social History Narrative  . Not on file   Social Determinants of Health   Financial Resource Strain:   . Difficulty of Paying Living Expenses: Not on file  Food Insecurity:   . Worried About Charity fundraiser in the Last Year: Not on file  . Ran Out of Food in the Last Year: Not on file  Transportation Needs:   . Lack of Transportation (Medical): Not on file  . Lack of Transportation (Non-Medical): Not on file  Physical Activity:   . Days of Exercise per Week: Not on file  . Minutes of Exercise per Session: Not on file  Stress:   . Feeling of Stress : Not on file  Social Connections:   . Frequency of Communication with Friends and  Family: Not on file  . Frequency of Social Gatherings with Friends and Family: Not on file  . Attends Religious Services: Not on file  . Active Member of Clubs or Organizations: Not on file  . Attends Archivist Meetings: Not on file  . Marital Status: Not on file   Family History  Problem Relation Age of Onset  . Cancer Paternal Aunt        pancreatic  . Cancer Maternal Grandmother        lung  . Autism spectrum disorder Sister    No Known Allergies Prior to Admission medications   Medication Sig Start Date End Date Taking? Authorizing Provider  ferrous sulfate 325 (65 FE) MG EC tablet Take 325 mg by mouth daily with breakfast.   Yes [provider]  lisinopril-hydrochlorothiazide (ZESTORETIC) 20-25 MG tablet Take 1 tablet by mouth daily. 09/05/19 09/04/20 Yes Vevelyn Francois, NP  Multiple Vitamin (MULTIVITAMIN) tablet Take 1 tablet by mouth daily.   Yes [provider]  traZODone (DESYREL) 50 MG tablet Take 1 tablet (50 mg total) by mouth at bedtime as needed for sleep. 10/16/18 10/18/19  Pucilowski, Marchia Bond, MD     Positive ROS: Otherwise negative  All other systems have been reviewed and were otherwise negative with the exception of those mentioned in the HPI and as above.  Physical Exam: Constitutional: Alert, well-appearing, no acute distress Ears: External ears without lesions or tenderness. Ear canals left ear canal and left TM are clear.  Right ear canal reveals a large plug of cerumen that was removed with curettes.  The right TM was clear.. Nasal: External nose without lesions. Clear nasal passages Oral: Oropharynx clear. Neck: No palpable adenopathy or masses Respiratory: Breathing comfortably  Skin: No facial/neck lesions or rash noted.  Procedures  Assessment: Right ear cerumen impaction  Plan: This was cleaned in the office.  I discussed with her concerning not using Q-tips in the ears. She will follow-up as needed.  Radene Journey,  MD

## 2019-11-07 ENCOUNTER — Other Ambulatory Visit: Payer: Self-pay | Admitting: Physician Assistant

## 2019-11-07 ENCOUNTER — Telehealth: Payer: Self-pay

## 2019-11-07 ENCOUNTER — Encounter: Payer: Self-pay | Admitting: Physician Assistant

## 2019-11-07 DIAGNOSIS — R002 Palpitations: Secondary | ICD-10-CM

## 2019-11-07 MED ORDER — METOPROLOL SUCCINATE ER 25 MG PO TB24: 25.0000 mg | ORAL_TABLET | Freq: Every day | ORAL | 3 refills | Status: DC

## 2019-11-07 MED FILL — METOPROLOL SUCCINATE ER 25: 25 | 90 days supply | Qty: 90 | Fill #0

## 2019-11-07 NOTE — Telephone Encounter (Signed)
-----   Message from Liliane Shi, [place] sent at 11/07/2019  9:21 AM EDT ----- Please call the pt. Her monitor shows sinus rhythm.  The number of PVCs is very low - so they are benign.  Her avg HR is 110.  I think she would benefit from the addition of a beta-blocker.  We should also check her thyroid b/c of her faster HR. PLAN:  - Arrange TSH - Metoprolol Succinate 25 mg once daily  - Send copy to PCP Richardson Dopp, PA-C    11/07/2019 9:08 AM

## 2019-11-07 NOTE — Telephone Encounter (Signed)
The patient has been notified of the result and verbalized understanding.  All questions (if any) were answered. Antonieta Iba, RN 11/07/2019 9:28 AM  Patient will come in 10/04 for TSH. Rx has been sent in to patient's preferred pharmacy.

## 2019-11-11 ENCOUNTER — Other Ambulatory Visit: Payer: No Typology Code available for payment source

## 2019-11-28 ENCOUNTER — Other Ambulatory Visit: Payer: No Typology Code available for payment source | Admitting: *Deleted

## 2019-11-28 ENCOUNTER — Other Ambulatory Visit: Payer: Self-pay

## 2019-11-28 DIAGNOSIS — R002 Palpitations: Secondary | ICD-10-CM

## 2019-11-29 LAB — TSH: TSH: 1.83 u[IU]/mL (ref 0.450–4.500)

## 2019-12-04 MED FILL — LISINOPRIL-HCTZ 20-25 MG TA: 20-25 | 90 days supply | Qty: 90 | Fill #1

## 2019-12-30 ENCOUNTER — Ambulatory Visit: Payer: No Typology Code available for payment source | Admitting: Nurse Practitioner

## 2020-01-20 ENCOUNTER — Ambulatory Visit (INDEPENDENT_AMBULATORY_CARE_PROVIDER_SITE_OTHER): Payer: No Typology Code available for payment source | Admitting: Nurse Practitioner

## 2020-01-20 ENCOUNTER — Other Ambulatory Visit: Payer: Self-pay

## 2020-01-20 ENCOUNTER — Encounter: Payer: Self-pay | Admitting: Nurse Practitioner

## 2020-01-20 VITALS — BP 130/79 | HR 88 | Temp 98.1°F | Resp 18 | Ht 65.0 in | Wt 201.4 lb

## 2020-01-20 DIAGNOSIS — Z124 Encounter for screening for malignant neoplasm of cervix: Secondary | ICD-10-CM | POA: Diagnosis not present

## 2020-01-20 NOTE — Progress Notes (Signed)
Requesting cream for eczema

## 2020-01-20 NOTE — Progress Notes (Signed)
Established Patient Office Visit  Subjective:  Patient ID: Kaylee Maxwell, female    DOB: [birth year]  Age: 44 y.o. MRN: [id number]  CC:  Chief Complaint  Patient presents with   Follow-up    HPI Kaylee Maxwell presents for pap test. She  has a past medical history of Anxiety (03/04/2018), Breast cancer screening (03/04/2018), Heart palpitations (03/04/2018), Hypertension, Irregular heartbeat (03/04/2018), and Scalp mass (10/09/2012).   Kaylee Maxwell has returned to have the second portion of her annual wellness visit.  Denies headache, dizziness, visual changes, shortness of breath, dyspnea on exertion, chest pain, nausea, vomiting or any edema.    Past Medical History:  Diagnosis Date   Anxiety 03/04/2018   Breast cancer screening 03/04/2018   Heart palpitations 03/04/2018   Monitor 9/21: normal sinus rhythm, sinus tachycardia, Avg HR 110; occ PVCs   Hypertension    Irregular heartbeat 03/04/2018   Scalp mass 10/09/2012    Past Surgical History:  Procedure Laterality Date   ANKLE FRACTURE SURGERY     cyst removal from scalp     left leg bone removal      Family History  Problem Relation Age of Onset   Cancer Paternal Aunt        pancreatic   Cancer Maternal Grandmother        lung   Autism spectrum disorder Sister     Social History   Socioeconomic History   Marital status: Single    Spouse name: Not on file   Number of children: 2   Years of education: Not on file   Highest education level: Associate degree: academic program  Occupational History   Not on file  Tobacco Use   Smoking status: Former Smoker    Packs/day: 0.50    Years: 18.00    Pack years: 9.00   Smokeless tobacco: Never Used  Scientific laboratory technician Use: Every day   Substances: Nicotine, Flavoring  Substance and Sexual Activity   Alcohol use: Yes    Comment: occ   Drug use: No   Sexual activity: Yes    Birth control/protection: None  Other Topics Concern   Not on file   Social History Narrative   Not on file   Social Determinants of Health   Financial Resource Strain: Not on file  Food Insecurity: Not on file  Transportation Needs: Not on file  Physical Activity: Not on file  Stress: Not on file  Social Connections: Not on file  Intimate Partner Violence: Not on file    Outpatient Medications Prior to Visit  Medication Sig Dispense Refill   ferrous sulfate 325 (65 FE) MG EC tablet Take 325 mg by mouth daily with breakfast.     lisinopril-hydrochlorothiazide (ZESTORETIC) 20-25 MG tablet Take 1 tablet by mouth daily. 90 tablet 3   metoprolol succinate (TOPROL XL) 25 MG 24 hr tablet Take 1 tablet (25 mg total) by mouth daily. 90 tablet 3   Multiple Vitamin (MULTIVITAMIN) tablet Take 1 tablet by mouth daily.     traZODone (DESYREL) 50 MG tablet Take 1 tablet (50 mg total) by mouth at bedtime as needed for sleep. 90 tablet 1   No facility-administered medications prior to visit.    No Known Allergies  ROS Review of Systems  All other systems reviewed and are negative.     Objective:    Physical Exam Constitutional:      General: She is not in acute distress.    Appearance: She is obese.  She is not ill-appearing, toxic-appearing or diaphoretic.  HENT:     Head: Normocephalic and atraumatic.  Cardiovascular:     Rate and Rhythm: Normal rate.     Pulses: Normal pulses.          Dorsalis pedis pulses are 2+ on the right side and 2+ on the left side.       Posterior tibial pulses are 2+ on the right side and 2+ on the left side.  Pulmonary:     Effort: Pulmonary effort is normal.  Genitourinary:    General: Normal vulva.     Rectum: Normal.  Musculoskeletal:     Cervical back: Normal range of motion.  Feet:     Right foot:     Protective Sensation: 10 sites tested. 10 sites sensed.     Skin integrity: Skin integrity normal.     Left foot:     Protective Sensation: 10 sites tested. 10 sites sensed.     Skin integrity: Skin  integrity normal.  Neurological:     Mental Status: She is alert.     BP 130/79 (BP Location: Right Arm, Patient Position: Sitting, Cuff Size: Large)    Pulse 88    Temp 98.1 F (36.7 C) (Temporal)    Resp 18    Ht 5' 5"  (1.651 m)    Wt 201 lb 6.4 oz (91.4 kg)    SpO2 100%    BMI 33.51 kg/m  Wt Readings from Last 3 Encounters:  01/20/20 201 lb 6.4 oz (91.4 kg)  10/18/19 197 lb (89.4 kg)  09/25/19 197 lb 9.6 oz (89.6 kg)     Health Maintenance Due  Topic Date Due   Hepatitis C Screening  Never done   FOOT EXAM  08/29/2019    There are no preventive care reminders to display for this patient.  Lab Results  Component Value Date   TSH 1.830 11/28/2019   Lab Results  Component Value Date   WBC 6.4 09/05/2019   HGB 12.8 09/05/2019   HCT 39.0 09/05/2019   MCV 91 09/05/2019   PLT 366 09/05/2019   Lab Results  Component Value Date   NA 139 09/05/2019   K 3.6 09/05/2019   CO2 22 08/29/2018   GLUCOSE 105 (H) 09/05/2019   BUN 10 09/05/2019   CREATININE 0.82 09/05/2019   BILITOT 0.5 09/05/2019   ALKPHOS 96 09/05/2019   AST 125 (H) 09/05/2019   ALT 43 (H) 08/29/2018   PROT 7.6 09/05/2019   ALBUMIN 4.3 09/05/2019   CALCIUM 10.2 09/05/2019   Lab Results  Component Value Date   CHOL 220 (H) 09/05/2019   Lab Results  Component Value Date   HDL 54 09/05/2019   Lab Results  Component Value Date   LDLCALC 139 (H) 09/05/2019   Lab Results  Component Value Date   TRIG 149 09/05/2019   Lab Results  Component Value Date   CHOLHDL 4.1 09/05/2019   Lab Results  Component Value Date   HGBA1C 5.5 09/05/2019   HGBA1C 5.5 09/05/2019   HGBA1C 5.5 (A) 09/05/2019   HGBA1C 5.5 09/05/2019      Assessment & Plan:   Problem List Items Addressed This Visit   None   Visit Diagnoses    Encounter for Papanicolaou smear of cervix    -  Primary   Relevant Orders   IGP,rfx Apt HPV ASCU,16/18,45 (Completed)   Screening for malignant neoplasm of cervix  No  orders of the defined types were placed in this encounter.   Follow-up: No follow-ups on file.    Vevelyn Francois, NP

## 2020-01-23 LAB — IGP,RFX APT HPV ASCU,16/18,45

## 2020-01-25 ENCOUNTER — Encounter: Payer: Self-pay | Admitting: Nurse Practitioner

## 2020-01-27 ENCOUNTER — Other Ambulatory Visit: Payer: Self-pay | Admitting: Nurse Practitioner

## 2020-01-27 MED ORDER — METRONIDAZOLE 500 MG PO TABS: 500.0000 mg | ORAL_TABLET | Freq: Three times a day (TID) | ORAL | 0 refills | Status: DC

## 2020-01-27 MED FILL — METRONIDAZOLE 500 MG TABS: 500 | 7 days supply | Qty: 21 | Fill #0

## 2020-02-03 MED FILL — METOPROLOL SUCCINATE ER 25: 25 | 90 days supply | Qty: 90 | Fill #1

## 2020-02-12 ENCOUNTER — Other Ambulatory Visit: Payer: Self-pay | Admitting: Nurse Practitioner

## 2020-02-12 DIAGNOSIS — Z113 Encounter for screening for infections with a predominantly sexual mode of transmission: Secondary | ICD-10-CM

## 2020-02-12 NOTE — Addendum Note (Signed)
Addended by: Vevelyn Francois on: 02/12/2020 09:15 AM   Modules accepted: Orders

## 2020-03-04 ENCOUNTER — Ambulatory Visit
Admission: EM | Admit: 2020-03-04 | Discharge: 2020-03-04 | Disposition: A | Discharge status: Home / Self Care | Payer: No Typology Code available for payment source | Attending: Urgent Care | Admitting: Urgent Care

## 2020-03-04 ENCOUNTER — Other Ambulatory Visit: Payer: Self-pay

## 2020-03-04 ENCOUNTER — Other Ambulatory Visit: Payer: Self-pay | Admitting: Urgent Care

## 2020-03-04 DIAGNOSIS — B349 Viral infection, unspecified: Secondary | ICD-10-CM | POA: Diagnosis not present

## 2020-03-04 DIAGNOSIS — R059 Cough, unspecified: Secondary | ICD-10-CM

## 2020-03-04 HISTORY — DX: Obesity, unspecified: E66.9

## 2020-03-04 MED ORDER — PSEUDOEPHEDRINE HCL 60 MG PO TABS: 60.0000 mg | ORAL_TABLET | Freq: Three times a day (TID) | ORAL | 0 refills | Status: DC | PRN

## 2020-03-04 MED ORDER — CETIRIZINE HCL 10 MG PO TABS: 10.0000 mg | ORAL_TABLET | Freq: Every day | ORAL | 0 refills | Status: DC

## 2020-03-04 MED ORDER — PROMETHAZINE-DM 6.25-15 MG/5ML PO SYRP: 5.0000 mL | ORAL_SOLUTION | Freq: Every evening | ORAL | 0 refills | Status: DC | PRN

## 2020-03-04 MED ORDER — BENZONATATE 100 MG PO CAPS: 100.0000 mg | ORAL_CAPSULE | Freq: Three times a day (TID) | ORAL | 0 refills | Status: DC | PRN

## 2020-03-04 MED FILL — PROMETHAZINE W/DM SYRUP: 6.25-15 | 20 days supply | Qty: 100 | Fill #0

## 2020-03-04 MED FILL — BENZONATATE 100 MG CAPS: 100 | 10 days supply | Qty: 60 | Fill #0

## 2020-03-04 NOTE — Discharge Instructions (Addendum)
We will notify you of your COVID-19 test results as they arrive and may take between 24 to 48 hours.  I encourage you to sign up for MyChart if you have not already done so as this can be the easiest way for [place] to communicate results to you online or through a phone app.  In the meantime, if you develop worsening symptoms including fever, chest pain, shortness of breath despite our current treatment plan then please report to the emergency room as this may be a sign of worsening status from possible COVID-19 infection.  Otherwise, we will manage this as a viral syndrome. For sore throat or cough try using a honey-based tea. Use 3 teaspoons of honey with juice squeezed from half lemon. Place shaved pieces of ginger into 1/2-1 cup of water and warm over stove top. Then mix the ingredients and repeat every 4 hours as needed. Please take Tylenol 536m-650mg every 6 hours for aches and pains, fevers. Hydrate very well with at least 2 liters of water. Eat light meals such as soups to replenish electrolytes and soft fruits, veggies. Start an antihistamine like Zyrtec, Allegra or Claritin for postnasal drainage, sinus congestion.  You can take this together with pseudoephedrine (Sudafed) at a dose of 60 mg 2-3 times a day as needed for the same kind of congestion.

## 2020-03-04 NOTE — ED Provider Notes (Signed)
[facility]   MRN: [id number] DOB: 08-20-1975  Subjective:   Kaylee Maxwell is a 45 y.o. female presenting for 3 day history of acute onset body aches, cough, fever, headaches, dizziness. Has had COVID vaccination. Spent the weekend with her family. Denise chest pain, shob. No history of lung disorders. Patient is working on quitting smoking.   No current facility-administered medications for this encounter.  Current Outpatient Medications:  .  ferrous sulfate 325 (65 FE) MG EC tablet, Take 325 mg by mouth daily with breakfast., Disp: , Rfl:  .  lisinopril-hydrochlorothiazide (ZESTORETIC) 20-25 MG tablet, Take 1 tablet by mouth daily., Disp: 90 tablet, Rfl: 3 .  metoprolol succinate (TOPROL XL) 25 MG 24 hr tablet, Take 1 tablet (25 mg total) by mouth daily., Disp: 90 tablet, Rfl: 3 .  metroNIDAZOLE (FLAGYL) 500 MG tablet, Take 1 tablet (500 mg total) by mouth 3 (three) times daily., Disp: 21 tablet, Rfl: 0 .  Multiple Vitamin (MULTIVITAMIN) tablet, Take 1 tablet by mouth daily., Disp: , Rfl:  .  traZODone (DESYREL) 50 MG tablet, Take 1 tablet (50 mg total) by mouth at bedtime as needed for sleep., Disp: 90 tablet, Rfl: 1   No Known Allergies  Past Medical History:  Diagnosis Date  . Anxiety 03/04/2018  . Breast cancer screening 03/04/2018  . Heart palpitations 03/04/2018   Monitor 9/21: normal sinus rhythm, sinus tachycardia, Avg HR 110; occ PVCs  . Hypertension   . Irregular heartbeat 03/04/2018  . Obesity   . Scalp mass 10/09/2012     Past Surgical History:  Procedure Laterality Date  . ANKLE FRACTURE SURGERY    . cyst removal from scalp    . left leg bone removal      Family History  Problem Relation Age of Onset  . Diabetes Mother   . Hypertension Mother   . Obesity Mother   . Hypertension Father   . Cancer Paternal Aunt        pancreatic  . Cancer Maternal Grandmother        lung  . Autism spectrum disorder Sister     Social History   Tobacco Use   . Smoking status: Former Smoker    Packs/day: 0.50    Years: 18.00    Pack years: 9.00  . Smokeless tobacco: Never Used  Vaping Use  . Vaping Use: Every day  . Substances: Nicotine, Flavoring  Substance Use Topics  . Alcohol use: Yes  . Drug use: No    ROS   Objective:   Vitals: BP 114/74 (BP Location: Left Arm)   Pulse 96   Temp 99.3 F (37.4 C) (Oral)   Resp 19   LMP 02/21/2020   SpO2 97%   Physical Exam Constitutional:      General: She is not in acute distress.    Appearance: Normal appearance. She is well-developed. She is not ill-appearing, toxic-appearing or diaphoretic.  HENT:     Head: Normocephalic and atraumatic.     Nose: Nose normal.     Mouth/Throat:     Mouth: Mucous membranes are moist.  Eyes:     Extraocular Movements: Extraocular movements intact.     Pupils: Pupils are equal, round, and reactive to light.  Cardiovascular:     Rate and Rhythm: Normal rate and regular rhythm.     Pulses: Normal pulses.     Heart sounds: Normal heart sounds. No murmur heard. No friction rub. No gallop.   Pulmonary:  Effort: Pulmonary effort is normal. No respiratory distress.     Breath sounds: Normal breath sounds. No stridor. No wheezing, rhonchi or rales.  Skin:    General: Skin is warm and dry.     Findings: No rash.  Neurological:     Mental Status: She is alert and oriented to person, place, and time.  Psychiatric:        Mood and Affect: Mood normal.        Behavior: Behavior normal.        Thought Content: Thought content normal.        Judgment: Judgment normal.     Assessment and Plan :   PDMP not reviewed this encounter.  1. Viral syndrome   2. Cough     Will manage for viral illness such as viral URI, viral syndrome, viral rhinitis, COVID-19. Counseled patient on nature of COVID-19 including modes of transmission, diagnostic testing, management and supportive care.  Offered scripts for symptomatic relief. COVID 19 testing is pending.  Counseled patient on potential for adverse effects with medications prescribed/recommended today, ER and return-to-clinic precautions discussed, patient verbalized understanding.     Jaynee Eagles, PA-C 03/04/20 1235

## 2020-03-04 NOTE — ED Triage Notes (Signed)
Pt is here with bodyaches, cough, fever and dizziness that started Monday, pt has taken OTC meds to relieve discomfort.

## 2020-03-06 LAB — NOVEL CORONAVIRUS, NAA: SARS-CoV-2, NAA: DETECTED — AB

## 2020-03-06 LAB — SARS-COV-2, NAA 2 DAY TAT

## 2020-03-06 MED FILL — LISINOPRIL-HCTZ 20-25 MG TA: 20-25 | 90 days supply | Qty: 90 | Fill #2

## 2020-04-15 ENCOUNTER — Other Ambulatory Visit: Payer: Self-pay | Admitting: Nurse Practitioner

## 2020-04-15 DIAGNOSIS — Z1231 Encounter for screening mammogram for malignant neoplasm of breast: Secondary | ICD-10-CM

## 2020-04-17 ENCOUNTER — Other Ambulatory Visit (HOSPITAL_COMMUNITY): Payer: Self-pay | Admitting: Psychiatry

## 2020-04-17 ENCOUNTER — Ambulatory Visit: Payer: No Typology Code available for payment source | Admitting: Cardiovascular Disease

## 2020-04-20 ENCOUNTER — Other Ambulatory Visit: Payer: Self-pay

## 2020-04-20 ENCOUNTER — Encounter: Payer: Self-pay | Admitting: Nurse Practitioner

## 2020-04-20 ENCOUNTER — Ambulatory Visit (INDEPENDENT_AMBULATORY_CARE_PROVIDER_SITE_OTHER): Payer: No Typology Code available for payment source | Admitting: Nurse Practitioner

## 2020-04-20 ENCOUNTER — Other Ambulatory Visit: Payer: Self-pay | Admitting: Nurse Practitioner

## 2020-04-20 VITALS — BP 119/72 | HR 111 | Ht 65.0 in | Wt 199.0 lb

## 2020-04-20 DIAGNOSIS — R197 Diarrhea, unspecified: Secondary | ICD-10-CM | POA: Insufficient documentation

## 2020-04-20 DIAGNOSIS — Z789 Other specified health status: Secondary | ICD-10-CM

## 2020-04-20 DIAGNOSIS — Z7289 Other problems related to lifestyle: Secondary | ICD-10-CM

## 2020-04-20 DIAGNOSIS — T148XXA Other injury of unspecified body region, initial encounter: Secondary | ICD-10-CM

## 2020-04-20 DIAGNOSIS — I10 Essential (primary) hypertension: Secondary | ICD-10-CM | POA: Diagnosis not present

## 2020-04-20 DIAGNOSIS — E1169 Type 2 diabetes mellitus with other specified complication: Secondary | ICD-10-CM

## 2020-04-20 DIAGNOSIS — E119 Type 2 diabetes mellitus without complications: Secondary | ICD-10-CM | POA: Insufficient documentation

## 2020-04-20 DIAGNOSIS — Z113 Encounter for screening for infections with a predominantly sexual mode of transmission: Secondary | ICD-10-CM | POA: Diagnosis not present

## 2020-04-20 DIAGNOSIS — Z1389 Encounter for screening for other disorder: Secondary | ICD-10-CM

## 2020-04-20 DIAGNOSIS — Z1211 Encounter for screening for malignant neoplasm of colon: Secondary | ICD-10-CM | POA: Diagnosis not present

## 2020-04-20 DIAGNOSIS — N870 Mild cervical dysplasia: Secondary | ICD-10-CM | POA: Insufficient documentation

## 2020-04-20 DIAGNOSIS — R609 Edema, unspecified: Secondary | ICD-10-CM

## 2020-04-20 LAB — POCT CBG (FASTING - GLUCOSE)-MANUAL ENTRY: Glucose Fasting, POC: 147 mg/dL — AB (ref 70–99)

## 2020-04-20 MED ORDER — IBUPROFEN 800 MG PO TABS: 800.0000 mg | ORAL_TABLET | Freq: Three times a day (TID) | ORAL | 0 refills | Status: DC | PRN

## 2020-04-20 MED ORDER — HYDROCORTISONE 2.5 % EX CREA: TOPICAL_CREAM | Freq: Two times a day (BID) | CUTANEOUS | 5 refills | Status: DC

## 2020-04-20 MED ORDER — TRAZODONE HCL 50 MG PO TABS
50.0000 mg | ORAL_TABLET | Freq: Every evening | ORAL | 1 refills | Status: DC | PRN
Start: 2020-04-20 — End: 2020-04-20

## 2020-04-20 NOTE — Patient Instructions (Addendum)
Healthy Eating Following a healthy eating pattern may help you to achieve and maintain a healthy body weight, reduce the risk of chronic disease, and live a long and productive life. It is important to follow a healthy eating pattern at an appropriate calorie level for your body. Your nutritional needs should be met primarily through food by choosing a variety of nutrient-rich foods. What are tips for following this plan? Reading food labels  Read labels and choose the following: ? Reduced or low sodium. ? Juices with 100% fruit juice. ? Foods with low saturated fats and high polyunsaturated and monounsaturated fats. ? Foods with whole grains, such as whole wheat, cracked wheat, brown rice, and wild rice. ? Whole grains that are fortified with folic acid. This is recommended for women who are pregnant or who want to become pregnant.  Read labels and avoid the following: ? Foods with a lot of added sugars. These include foods that contain brown sugar, corn sweetener, corn syrup, dextrose, fructose, glucose, high-fructose corn syrup, honey, invert sugar, lactose, malt syrup, maltose, molasses, raw sugar, sucrose, trehalose, or turbinado sugar.  Do not eat more than the following amounts of added sugar per day:  6 teaspoons (25 g) for women.  9 teaspoons (38 g) for men. ? Foods that contain processed or refined starches and grains. ? Refined grain products, such as white flour, degermed cornmeal, white bread, and white rice. Shopping  Choose nutrient-rich snacks, such as vegetables, whole fruits, and nuts. Avoid high-calorie and high-sugar snacks, such as potato chips, fruit snacks, and candy.  Use oil-based dressings and spreads on foods instead of solid fats such as butter, stick margarine, or cream cheese.  Limit pre-made sauces, mixes, and "instant" products such as flavored rice, instant noodles, and ready-made pasta.  Try more plant-protein sources, such as tofu, tempeh, black beans,  edamame, lentils, nuts, and seeds.  Explore eating plans such as the Mediterranean diet or vegetarian diet. Cooking  Use oil to saut or stir-fry foods instead of solid fats such as butter, stick margarine, or lard.  Try baking, boiling, grilling, or broiling instead of frying.  Remove the fatty part of meats before cooking.  Steam vegetables in water or broth. Meal planning  At meals, imagine dividing your plate into fourths: ? One-half of your plate is fruits and vegetables. ? One-fourth of your plate is whole grains. ? One-fourth of your plate is protein, especially lean meats, poultry, eggs, tofu, beans, or nuts.  Include low-fat dairy as part of your daily diet.   Lifestyle  Choose healthy options in all settings, including home, work, school, restaurants, or stores.  Prepare your food safely: ? Wash your hands after handling raw meats. ? Keep food preparation surfaces clean by regularly washing with hot, soapy water. ? Keep raw meats separate from ready-to-eat foods, such as fruits and vegetables. ? Cook seafood, meat, poultry, and eggs to the recommended internal temperature. ? Store foods at safe temperatures. In general:  Keep cold foods at 7F (4.4C) or below.  Keep hot foods at 17F (60C) or above.  Keep your freezer at [facility] (-17.8C) or below.  Foods are no longer safe to eat when they have been between the temperatures of 40-17F (4.4-60C) for more than 2 hours. What foods should I eat? Fruits Aim to eat 2 cup-equivalents of fresh, canned (in natural juice), or frozen fruits each day. Examples of 1 cup-equivalent of fruit include 1 small apple, 8 large strawberries, 1 cup canned fruit,  cup dried fruit, or 1 cup 100% juice. Vegetables Aim to eat 2-3 cup-equivalents of fresh and frozen vegetables each day, including different varieties and colors. Examples of 1 cup-equivalent of vegetables include 2 medium carrots, 2 cups raw, leafy greens, 1 cup chopped  vegetable (raw or cooked), or 1 medium baked potato. Grains Aim to eat 6 ounce-equivalents of whole grains each day. Examples of 1 ounce-equivalent of grains include 1 slice of bread, 1 cup ready-to-eat cereal, 3 cups popcorn, or  cup cooked rice, pasta, or cereal. Meats and other proteins Aim to eat 5-6 ounce-equivalents of protein each day. Examples of 1 ounce-equivalent of protein include 1 egg, 1/2 cup nuts or seeds, or 1 tablespoon (16 g) peanut butter. A cut of meat or fish that is the size of a deck of cards is about 3-4 ounce-equivalents.  Of the protein you eat each week, try to have at least 8 ounces come from seafood. This includes salmon, trout, herring, and anchovies. Dairy Aim to eat 3 cup-equivalents of fat-free or low-fat dairy each day. Examples of 1 cup-equivalent of dairy include 1 cup (240 mL) milk, 8 ounces (250 g) yogurt, 1 ounces (44 g) natural cheese, or 1 cup (240 mL) fortified soy milk. Fats and oils  Aim for about 5 teaspoons (21 g) per day. Choose monounsaturated fats, such as canola and olive oils, avocados, peanut butter, and most nuts, or polyunsaturated fats, such as sunflower, corn, and soybean oils, walnuts, pine nuts, sesame seeds, sunflower seeds, and flaxseed. Beverages  Aim for six 8-oz glasses of water per day. Limit coffee to three to five 8-oz cups per day.  Limit caffeinated beverages that have added calories, such as soda and energy drinks.  Limit alcohol intake to no more than 1 drink a day for nonpregnant women and 2 drinks a day for men. One drink equals 12 oz of beer (355 mL), 5 oz of wine (148 mL), or 1 oz of hard liquor (44 mL). Seasoning and other foods  Avoid adding excess amounts of salt to your foods. Try flavoring foods with herbs and spices instead of salt.  Avoid adding sugar to foods.  Try using oil-based dressings, sauces, and spreads instead of solid fats. This information is based on general [place] nutrition guidelines. For more  information, visit BuildDNA.es. Exact amounts may vary based on your nutrition needs. Summary  A healthy eating plan may help you to maintain a healthy weight, reduce the risk of chronic diseases, and stay active throughout your life.  Plan your meals. Make sure you eat the right portions of a variety of nutrient-rich foods.  Try baking, boiling, grilling, or broiling instead of frying.  Choose healthy options in all settings, including home, work, school, restaurants, or stores. This information is not intended to replace advice given to you by your health care provider. Make sure you discuss any questions you have with your health care provider. Document Revised: 05/08/2017 Document Reviewed: 05/08/2017 Elsevier Patient Education  2021 [place] drinking (NIH Publication No. 45-6256). Retrieved from https://pubs.ScienceMakers.nl.pdf">  Alcohol Use Disorder Alcohol use disorder is a condition in which drinking disrupts daily life. People with this condition drink too much alcohol and cannot control their drinking. Alcohol use disorder can cause serious problems with physical health. It can affect the brain, heart, and other internal organs. This disorder can raise the risk for certain cancers and cause problems with mental health, such as depression or anxiety. What are the causes?  This condition is caused by drinking too much alcohol over time. Some people with this condition drink to cope with or escape from negative life events. Others drink to relieve pain or symptoms of mental illness. What increases the risk? You are more likely to develop this condition if:  You have a family history of alcohol use disorder.  Your culture encourages drinking to the point of becoming drunk (intoxication).  You had a mood or conduct disorder in childhood.  You have been abused.  You are an adolescent and you: ? Have poor  performance in school. ? Have poor supervision or guidance. ? Act on impulse and like taking risks. What are the signs or symptoms? Symptoms of this condition include:  Drinking more than you want to.  Trying several times without success to drink less.  Spending a lot of time thinking about alcohol, getting alcohol, drinking, or recovering from drinking.  Continuing to drink even when it is causing serious problems in your daily life.  Drinking when it is dangerous to drink, such as before driving a car.  Needing more and more alcohol to get the same effect you want (building up tolerance).  Having symptoms of withdrawal when you stop drinking. Withdrawal symptoms may include: ? Trouble sleeping, leading to tiredness (fatigue). ? Mood swings of depression and anxiety. ? Physical symptoms, such as a fast heart rate, rapid breathing, high blood pressure (hypertension), fever, cold sweats, or nausea. ? Seizures. ? Severe confusion. ? Feeling or seeing things that are not there (hallucinations). ? Shaking movements that you cannot control (tremors). How is this diagnosed? This condition is diagnosed with an assessment. Your health care provider may start by asking three or four questions about your drinking, or he or she may give you a simple test to take. This helps to get clear information from you. You may also have a physical exam or lab tests. You may be referred to a substance abuse counselor. How is this treated? With education, some people with alcohol use disorder are able to reduce their drinking. Many with this disorder cannot change their drinking behavior on their own and need help from substance use specialists. These specialists are counselors who can help diagnose how severe your disorder is and what type of treatment you need. Treatments may include:  Detoxification. Detoxification involves quitting drinking with supervision and direction of health care providers. Your  health care provider may prescribe prescription medicines within the first week to help lessen withdrawal symptoms. Alcohol withdrawal can be dangerous and life-threatening. Detoxification may be provided in a home, community, or primary care setting, or in a hospital or substance use treatment facility.  Counseling. This may involve motivational interviewing (MI), family therapy, or cognitive behavioral therapy (CBT). It is provided by substance use treatment counselors or professional therapists. A counselor can address the things you can do to change your drinking behavior and how to maintain the changes. Talk therapy aims to: ? Identify your positive motivations to change. ? Identify and avoid the things that trigger your drinking. ? Help you learn how to plan your behavior change. ? Develop support systems that can help you sustain the change.  Medicines. Medicines can help treat this disorder by: ? Decreasing cravings. ? Decreasing the positive feeling you have when you drink. ? Causing an uncomfortable physical reaction when you drink (aversion therapy).  Mutual help groups such as Alcoholics Anonymous (AA). These groups are led by people who have quit drinking. The groups provide  emotional support, advice, and guidance. Some people with this condition benefit from a combination of treatments provided by specialized substance use treatment centers.   Follow these instructions at home: Medicines  Take over-the-counter and prescription medicines only as told by your health care provider.  Ask before starting any new medicines, herbs, or supplements. General instructions  Ask friends and family members to support your choice to stay sober.  Avoid situations where alcohol is served.  Create a plan to deal with tempting situations.  Attend support groups regularly.  Practice hobbies or activities you enjoy.  Do not drink and drive.  Keep all follow-up visits as told by your  health care provider. This is important.   How is this prevented?  If you drink alcohol: ? Limit how much you use to:  0-1 drink a day for nonpregnant women.  0-2 drinks a day for men. ? Be aware of how much alcohol is in your drink. In the [place] one drink equals one 12 oz bottle of beer (355 mL), one 5 oz glass of wine (148 mL), or one 1 oz glass of hard liquor (44 mL).  If you have a mental health condition, seek treatment. Develop a healthy lifestyle through: ? Meditation or deep breathing. ? Exercise. ? Spending time in nature. ? Listening to music. ? Talking with a trusted friend or family member.  If you are an adolescent: ? Do not drink alcohol. Avoid gatherings where you might be tempted. ? Do not be afraid to say no if someone offers you alcohol. Speak up about why you do not want to drink. Set a positive example for others around you by not drinking. ? Build relationships with friends who do not drink. Where to find more information  Substance Abuse and Mental Health Services Administration: SamedayNews.com.cy  Alcoholics Anonymous: ShedSizes.ch Contact a health care provider if:  You cannot take your medicines as told.  Your symptoms get worse or you experience symptoms of withdrawal when you stop drinking.  You start drinking again (relapse) and your symptoms get worse. Get help right away if:  You have thoughts about hurting yourself or others. If you ever feel like you may hurt yourself or others, or have thoughts about taking your own life, get help right away. Go to your nearest emergency department or:  Call your local emergency services (911 in the [place]  Call a suicide crisis helpline, such as the [place] at 807-106-6402. This is open 24 hours a day in the [place]  Text the Crisis Text Line at 6030213797 (in the [place].). Summary  Alcohol use disorder is a condition in which drinking disrupts daily life. People with this condition drink too  much alcohol and cannot control their drinking.  Treatment may include detoxification, counseling, medicines, and support groups.  Ask friends and family members to support you. Avoid situations where alcohol is served.  Get help right away if you have thoughts about hurting yourself or others. This information is not intended to replace advice given to you by your health care provider. Make sure you discuss any questions you have with your health care provider. Document Revised: 12/13/2018 Document Reviewed: 12/13/2018 Elsevier Patient Education  2021 [employer].

## 2020-04-20 NOTE — Progress Notes (Signed)
[place], [place]  [address] Phone:  [phone]   Fax:  [phone]   Established Patient Office Visit  Subjective:  Patient ID: Kaylee Maxwell, female    DOB: 10/11/1975  Age: 45 y.o. MRN: [id number]  CC:  Chief Complaint  Patient presents with  . Hypertension    HPI Kaylee Maxwell presents for follow up. She  has a past medical history of Anxiety (03/04/2018), Breast cancer screening (03/04/2018), Heart palpitations (03/04/2018), Hypertension, Irregular heartbeat (03/04/2018), Obesity, and Scalp mass (10/09/2012).    Edema Patient complains of edema in both ankles and feet. The edema has been severe. Onset of symptoms was several months ago, and patient reports symptoms have gradually worsened since that time. The edema is present intermittently and upon awakening. The swelling has been aggravated by dependency of involved area, increased salt intake and foods and alcohol use. . The swelling has been relieved by diuretics, elevation of involved area, low-salt diet, avoidance of alcohol. Associated factors include: nothing. Cardiac risk factors include diabetes mellitus, hypertension, obesity (BMI >= 30 kg/m2) and sedentary lifestyle. She admits that she does over use alcohol. She feels like it helps to be more free. She does feel like this has been come more than just on the weekends. She admits that there is a family history of alcoholism; father. Grandfather suffered from Gout.    Past Medical History:  Diagnosis Date  . Anxiety 03/04/2018  . Breast cancer screening 03/04/2018  . Heart palpitations 03/04/2018   Monitor 9/21: normal sinus rhythm, sinus tachycardia, Avg HR 110; occ PVCs  . Hypertension   . Irregular heartbeat 03/04/2018  . Obesity   . Scalp mass 10/09/2012    Past Surgical History:  Procedure Laterality Date  . ANKLE FRACTURE SURGERY    . cyst removal from scalp    . left leg bone removal      Family History   Problem Relation Age of Onset  . Diabetes Mother   . Hypertension Mother   . Obesity Mother   . Hypertension Father   . Cancer Paternal Aunt        pancreatic  . Cancer Maternal Grandmother        lung  . Autism spectrum disorder Sister     Social History   Socioeconomic History  . Marital status: Single    Spouse name: Not on file  . Number of children: 2  . Years of education: Not on file  . Highest education level: Associate degree: academic program  Occupational History  . Not on file  Tobacco Use  . Smoking status: Former Smoker    Packs/day: 0.50    Years: 18.00    Pack years: 9.00  . Smokeless tobacco: Never Used  Vaping Use  . Vaping Use: Every day  . Substances: Nicotine, Flavoring  Substance and Sexual Activity  . Alcohol use: Yes  . Drug use: No  . Sexual activity: Yes    Birth control/protection: None  Other Topics Concern  . Not on file  Social History Narrative  . Not on file   Social Determinants of Health   Financial Resource Strain: Not on file  Food Insecurity: Not on file  Transportation Needs: Not on file  Physical Activity: Not on file  Stress: Not on file  Social Connections: Not on file  Intimate Partner Violence: Not on file    Outpatient Medications Prior to Visit  Medication Sig Dispense Refill  .  ferrous sulfate 325 (65 FE) MG EC tablet Take 325 mg by mouth daily with breakfast.    . lisinopril-hydrochlorothiazide (ZESTORETIC) 20-25 MG tablet Take 1 tablet by mouth daily. 90 tablet 3  . metoprolol succinate (TOPROL XL) 25 MG 24 hr tablet Take 1 tablet (25 mg total) by mouth daily. 90 tablet 3  . Multiple Vitamin (MULTIVITAMIN) tablet Take 1 tablet by mouth daily.    . benzonatate (TESSALON) 100 MG capsule Take 1-2 capsules (100-200 mg total) by mouth 3 (three) times daily as needed for cough. 60 capsule 0  . cetirizine (ZYRTEC ALLERGY) 10 MG tablet Take 1 tablet (10 mg total) by mouth daily. 30 tablet 0  . metroNIDAZOLE (FLAGYL)  500 MG tablet Take 1 tablet (500 mg total) by mouth 3 (three) times daily. 21 tablet 0  . promethazine-dextromethorphan (PROMETHAZINE-DM) 6.25-15 MG/5ML syrup Take 5 mLs by mouth at bedtime as needed for cough. 100 mL 0  . pseudoephedrine (SUDAFED) 60 MG tablet Take 1 tablet (60 mg total) by mouth every 8 (eight) hours as needed for congestion. 30 tablet 0  . traZODone (DESYREL) 50 MG tablet Take 1 tablet (50 mg total) by mouth at bedtime as needed for sleep. 90 tablet 1   No facility-administered medications prior to visit.    No Known Allergies  ROS Review of Systems  Gastrointestinal: Positive for diarrhea. Negative for constipation.  Skin:       Mole       Objective:    Physical Exam HENT:     Head: Normocephalic and atraumatic.     Nose: Nose normal.     Mouth/Throat:     Mouth: Mucous membranes are moist.  Cardiovascular:     Rate and Rhythm: Normal rate and regular rhythm.     Pulses: Normal pulses.     Heart sounds: Normal heart sounds.  Pulmonary:     Effort: Pulmonary effort is normal.     Breath sounds: Normal breath sounds.  Abdominal:     General: Bowel sounds are normal.     Palpations: Abdomen is soft.  Musculoskeletal:     Cervical back: Normal range of motion.  Skin:    Capillary Refill: Capillary refill takes less than 2 seconds.     Comments: Nevus to right lower leg lateral  Bruise to left upper lower thigh  Neurological:     General: No focal deficit present.     Mental Status: She is alert and oriented to person, place, and time.  Psychiatric:        Mood and Affect: Mood normal.        Behavior: Behavior normal.        Thought Content: Thought content normal.        Judgment: Judgment normal.     BP 119/72   Pulse (!) 111   Ht 5' 5"  (1.651 m)   Wt 199 lb (90.3 kg)   LMP 04/10/2020   SpO2 100%   BMI 33.12 kg/m  Wt Readings from Last 3 Encounters:  04/20/20 199 lb (90.3 kg)  01/20/20 201 lb 6.4 oz (91.4 kg)  10/18/19 197 lb (89.4  kg)     Health Maintenance Due  Topic Date Due  . COLONOSCOPY (Pts 45-2yr Insurance coverage will need to be confirmed)  Never done  . HEMOGLOBIN A1C  03/07/2020    There are no preventive care reminders to display for this patient.  Lab Results  Component Value Date   TSH 1.830 11/28/2019  Lab Results  Component Value Date   WBC 6.4 09/05/2019   HGB 12.8 09/05/2019   HCT 39.0 09/05/2019   MCV 91 09/05/2019   PLT 366 09/05/2019   Lab Results  Component Value Date   NA 139 09/05/2019   K 3.6 09/05/2019   CO2 22 08/29/2018   GLUCOSE 105 (H) 09/05/2019   BUN 10 09/05/2019   CREATININE 0.82 09/05/2019   BILITOT 0.5 09/05/2019   ALKPHOS 96 09/05/2019   AST 125 (H) 09/05/2019   ALT 43 (H) 08/29/2018   PROT 7.6 09/05/2019   ALBUMIN 4.3 09/05/2019   CALCIUM 10.2 09/05/2019   Lab Results  Component Value Date   CHOL 220 (H) 09/05/2019   Lab Results  Component Value Date   HDL 54 09/05/2019   Lab Results  Component Value Date   LDLCALC 139 (H) 09/05/2019   Lab Results  Component Value Date   TRIG 149 09/05/2019   Lab Results  Component Value Date   CHOLHDL 4.1 09/05/2019   Lab Results  Component Value Date   HGBA1C 5.5 09/05/2019   HGBA1C 5.5 09/05/2019   HGBA1C 5.5 (A) 09/05/2019   HGBA1C 5.5 09/05/2019      Assessment & Plan:   Problem List Items Addressed This Visit      Cardiovascular and Mediastinum   Essential hypertension Stable  Encouraged on going compliance with current medication regimen Encouraged home monitoring and recording BP <130/80 Eating a heart-healthy diet with less salt Encouraged regular physical activity  Recommend Weight loss     Endocrine   Diabetes mellitus ([place]) Stable no current treatment A1c pending Lifestyle modification with healthy diet (fewer calories, more high fiber foods, whole grains and non-starchy vegetables, lower fat meat and fish, low-fat diary include healthy oils) regular exercise (physical  activity) and weight loss   Relevant Orders   Comp. Metabolic Panel (12)   Lipid panel   POCT CBG (Fasting - Glucose) (Completed)   POCT URINALYSIS DIP (CLINITEK)   Hemoglobin A1c    Other Visit Diagnoses    Colon cancer screening Cologuard to be completed   Relevant Orders   Cologuard   Screening for STD (sexually transmitted disease)       Relevant Orders   Chlamydia/Gonococcus/Trichomonas, NAA   STD Screen (8)   Swelling     Screening for gout given HPI   Relevant Orders   Arthritis Panel   Bruising       Relevant Orders   CBC with Differential/Platelet   Alcohol use   Discussed when pt potential risk. When ready to quit would arrange for counseling         Meds ordered this encounter  Medications  . traZODone (DESYREL) 50 MG tablet    Sig: Take 1 tablet (50 mg total) by mouth at bedtime as needed for sleep.    Dispense:  90 tablet    Refill:  1  . ibuprofen (ADVIL) 800 MG tablet    Sig: Take 1 tablet (800 mg total) by mouth every 8 (eight) hours as needed for up to 14 days for moderate pain. Take on full stomach.    Dispense:  42 tablet    Refill:  0    Order Specific Question:   Supervising Provider    Answer:   Tresa Garter [id number]  . hydrocortisone 2.5 % cream    Sig: Apply topically 2 (two) times daily.    Dispense:  30 g    Refill:  5  Order Specific Question:   Supervising Provider    Answer:   Tresa Garter [[id number]]    Follow-up: Return in about 6 months (around 10/21/2020) for Follow up HTN [id number].    Vevelyn Francois, NP

## 2020-04-22 LAB — COMP. METABOLIC PANEL (12)
AST: 148 IU/L — ABNORMAL HIGH (ref 0–40)
Albumin/Globulin Ratio: 1.1 — ABNORMAL LOW (ref 1.2–2.2)
Albumin: 4.4 g/dL (ref 3.8–4.8)
Alkaline Phosphatase: 107 IU/L (ref 44–121)
BUN/Creatinine Ratio: 19 (ref 9–23)
BUN: 27 mg/dL — ABNORMAL HIGH (ref 6–24)
Bilirubin Total: 0.2 mg/dL (ref 0.0–1.2)
Calcium: 10.3 mg/dL — ABNORMAL HIGH (ref 8.7–10.2)
Chloride: 99 mmol/L (ref 96–106)
Creatinine, Ser: 1.45 mg/dL — ABNORMAL HIGH (ref 0.57–1.00)
Globulin, Total: 3.9 g/dL (ref 1.5–4.5)
Glucose: 136 mg/dL — ABNORMAL HIGH (ref 65–99)
Potassium: 4.1 mmol/L (ref 3.5–5.2)
Sodium: 137 mmol/L (ref 134–144)
Total Protein: 8.3 g/dL (ref 6.0–8.5)
eGFR: 45 mL/min/{1.73_m2} — ABNORMAL LOW (ref >59)

## 2020-04-22 LAB — CBC WITH DIFFERENTIAL/PLATELET
Basophils Absolute: 0 10*3/uL (ref 0.0–0.2)
Basos: 0 %
EOS (ABSOLUTE): 0.1 10*3/uL (ref 0.0–0.4)
Eos: 3 %
Hematocrit: 35.1 % (ref 34.0–46.6)
Hemoglobin: 11.3 g/dL (ref 11.1–15.9)
Immature Grans (Abs): 0 10*3/uL (ref 0.0–0.1)
Immature Granulocytes: 0 %
Lymphocytes Absolute: 1.7 10*3/uL (ref 0.7–3.1)
Lymphs: 32 %
MCH: 29.2 pg (ref 26.6–33.0)
MCHC: 32.2 g/dL (ref 31.5–35.7)
MCV: 91 fL (ref 79–97)
Monocytes Absolute: 0.6 10*3/uL (ref 0.1–0.9)
Monocytes: 12 %
Neutrophils Absolute: 2.8 10*3/uL (ref 1.4–7.0)
Neutrophils: 53 %
Platelets: 392 10*3/uL (ref 150–450)
RBC: 3.87 x10E6/uL (ref 3.77–5.28)
RDW: 13.9 % (ref 11.7–15.4)
WBC: 5.2 10*3/uL (ref 3.4–10.8)

## 2020-04-22 LAB — STD SCREEN (8)
HIV Screen 4th Generation wRfx: NONREACTIVE
HSV 1 Glycoprotein G Ab, IgG: <0.91 index (ref 0.00–0.90)
HSV 2 IgG, Type Spec: 18.7 index — ABNORMAL HIGH (ref 0.00–0.90)
Hep A IgM: NEGATIVE
Hep B C IgM: NEGATIVE
Hep C Virus Ab: <0.1 s/co ratio (ref 0.0–0.9)
Hepatitis B Surface Ag: NEGATIVE
RPR Ser Ql: NONREACTIVE

## 2020-04-22 LAB — ARTHRITIS PANEL
Anti Nuclear Antibody (ANA): NEGATIVE
Rheumatoid fact SerPl-aCnc: 11.3 IU/mL (ref <14.0)
Sed Rate: 73 mm/hr — ABNORMAL HIGH (ref 0–32)
Uric Acid: 12.1 mg/dL — ABNORMAL HIGH (ref 2.6–6.2)

## 2020-04-22 LAB — HEMOGLOBIN A1C
Est. average glucose Bld gHb Est-mCnc: 128 mg/dL
Hgb A1c MFr Bld: 6.1 % — ABNORMAL HIGH (ref 4.8–5.6)

## 2020-04-22 LAB — LIPID PANEL
Chol/HDL Ratio: 5.3 ratio — ABNORMAL HIGH (ref 0.0–4.4)
Cholesterol, Total: 242 mg/dL — ABNORMAL HIGH (ref 100–199)
HDL: 46 mg/dL (ref >39)
LDL Chol Calc (NIH): 167 mg/dL — ABNORMAL HIGH (ref 0–99)
Triglycerides: 159 mg/dL — ABNORMAL HIGH (ref 0–149)
VLDL Cholesterol Cal: 29 mg/dL (ref 5–40)

## 2020-04-22 LAB — CHLAMYDIA/GONOCOCCUS/TRICHOMONAS, NAA
Chlamydia by NAA: NEGATIVE
Gonococcus by NAA: NEGATIVE
Trich vag by NAA: NEGATIVE

## 2020-04-27 ENCOUNTER — Other Ambulatory Visit (HOSPITAL_BASED_OUTPATIENT_CLINIC_OR_DEPARTMENT_OTHER): Payer: Self-pay

## 2020-05-05 ENCOUNTER — Other Ambulatory Visit (HOSPITAL_BASED_OUTPATIENT_CLINIC_OR_DEPARTMENT_OTHER): Payer: Self-pay

## 2020-05-08 MED FILL — METOPROLOL SUCCINATE ER 25: 25 | 90 days supply | Qty: 90 | Fill #2

## 2020-05-21 ENCOUNTER — Encounter: Payer: Self-pay | Admitting: Cardiovascular Disease

## 2020-05-21 NOTE — Progress Notes (Signed)
No show  This encounter was created in error - please disregard.

## 2020-05-22 ENCOUNTER — Encounter: Payer: No Typology Code available for payment source | Admitting: Cardiovascular Disease

## 2020-06-09 ENCOUNTER — Other Ambulatory Visit: Payer: Self-pay

## 2020-06-09 ENCOUNTER — Ambulatory Visit
Admission: RE | Admit: 2020-06-09 | Discharge: 2020-06-09 | Disposition: A | Discharge status: Home / Self Care | Payer: No Typology Code available for payment source | Source: Ambulatory Visit | Attending: Nurse Practitioner | Admitting: Nurse Practitioner

## 2020-06-09 DIAGNOSIS — Z1231 Encounter for screening mammogram for malignant neoplasm of breast: Secondary | ICD-10-CM

## 2020-06-17 ENCOUNTER — Other Ambulatory Visit (HOSPITAL_COMMUNITY): Payer: Self-pay

## 2020-06-17 MED FILL — Lisinopril & Hydrochlorothiazide Tab 20-25 MG: ORAL | 90 days supply | Qty: 90 | Fill #0 | Status: AC

## 2020-08-11 ENCOUNTER — Other Ambulatory Visit (HOSPITAL_COMMUNITY): Payer: Self-pay

## 2020-08-11 MED FILL — Metoprolol Succinate Tab ER 24HR 25 MG (Tartrate Equiv): ORAL | 90 days supply | Qty: 90 | Fill #0 | Status: AC

## 2020-09-14 ENCOUNTER — Other Ambulatory Visit (HOSPITAL_COMMUNITY): Payer: Self-pay

## 2020-09-14 ENCOUNTER — Other Ambulatory Visit: Payer: Self-pay | Admitting: Nurse Practitioner

## 2020-09-15 ENCOUNTER — Other Ambulatory Visit (HOSPITAL_COMMUNITY): Payer: Self-pay

## 2020-09-15 MED ORDER — LISINOPRIL-HYDROCHLOROTHIAZIDE 20-25 MG PO TABS
1.0000 | ORAL_TABLET | Freq: Every day | ORAL | 3 refills | Status: DC
  Filled 2020-09-15: qty 90, 90d supply, fill #0
  Filled 2020-12-16: qty 90, 90d supply, fill #1
  Filled 2021-03-17: qty 90, 90d supply, fill #2

## 2020-10-02 ENCOUNTER — Encounter: Payer: Self-pay | Admitting: Nurse Practitioner

## 2020-10-02 ENCOUNTER — Ambulatory Visit (INDEPENDENT_AMBULATORY_CARE_PROVIDER_SITE_OTHER): Payer: No Typology Code available for payment source | Admitting: Nurse Practitioner

## 2020-10-02 ENCOUNTER — Other Ambulatory Visit: Payer: Self-pay

## 2020-10-02 VITALS — BP 102/58 | HR 109 | Temp 97.7°F | Ht 65.0 in | Wt 198.0 lb

## 2020-10-02 DIAGNOSIS — E1169 Type 2 diabetes mellitus with other specified complication: Secondary | ICD-10-CM

## 2020-10-02 DIAGNOSIS — N939 Abnormal uterine and vaginal bleeding, unspecified: Secondary | ICD-10-CM

## 2020-10-02 LAB — POCT URINALYSIS DIP (CLINITEK)
Glucose, UA: NEGATIVE mg/dL
Ketones, POC UA: NEGATIVE mg/dL
Nitrite, UA: NEGATIVE
POC PROTEIN,UA: 100 — AB
Spec Grav, UA: 1.025 (ref 1.010–1.025)
Urobilinogen, UA: 0.2 E.U./dL
pH, UA: 5.5 (ref 5.0–8.0)

## 2020-10-02 LAB — POCT GLYCOSYLATED HEMOGLOBIN (HGB A1C)
HbA1c POC (<> result, manual entry): 6.2 % (ref 4.0–5.6)
HbA1c, POC (controlled diabetic range): 6.2 % (ref 0.0–7.0)
HbA1c, POC (prediabetic range): 6.2 % (ref 5.7–6.4)
Hemoglobin A1C: 6.2 % — AB (ref 4.0–5.6)

## 2020-10-02 NOTE — Progress Notes (Signed)
[place], [place]  [address] Phone:  [phone]   Fax:  [phone] Subjective:   Patient ID: Kaylee Maxwell, female    DOB: Aug 17, 1975, 45 y.o.   MRN: [id number]  Chief Complaint  Patient presents with   Acute Visit    Heavy menstrual bleeding, started yesterday. Oversize pad lasting 2 hours clots this morning    HPI Kaylee Maxwell 45 y.o. female presents with history of hypertension, type 2 diabetes, irregular heartbeat, and abnormal vaginal bleeding to the [facility] for increased vaginal bleeding.  Patient indicates that her menstrual cycle started yesterday.  At the initiation of cycle it was light bleeding, overnight she began experience very heavy bleeding, saturating 2 heavy pads and a few hours.  Has also been concerned about noting clots in her menstrual pad during cycles over the past few months.  Denies any abdominal pain, back pain, chest pain or shortness of breath.  Denies any headache, dizziness or blurry vision.  Had 1 previous occurrence with similar symptoms, no medication was provided at that time, symptoms subsided without intervention. Denies any other complaints. Currently compliant with all medications.  Past Medical History:  Diagnosis Date   Anxiety 03/04/2018   Breast cancer screening 03/04/2018   Heart palpitations 03/04/2018   Monitor 9/21: normal sinus rhythm, sinus tachycardia, Avg HR 110; occ PVCs   Hypertension    Irregular heartbeat 03/04/2018   Obesity    Scalp mass 10/09/2012    Past Surgical History:  Procedure Laterality Date   ANKLE FRACTURE SURGERY     cyst removal from scalp     left leg bone removal      Family History  Problem Relation Age of Onset   Diabetes Mother    Hypertension Mother    Obesity Mother    Hypertension Father    Cancer Paternal Aunt        pancreatic   Cancer Maternal Grandmother        lung   Autism spectrum disorder Sister     Social History   Socioeconomic  History   Marital status: Single    Spouse name: Not on file   Number of children: 2   Years of education: Not on file   Highest education level: Associate degree: academic program  Occupational History   Not on file  Tobacco Use   Smoking status: Former    Packs/day: 0.50    Years: 18.00    Pack years: 9.00    Types: Cigarettes   Smokeless tobacco: Never  Vaping Use   Vaping Use: Every day   Substances: Nicotine, Flavoring  Substance and Sexual Activity   Alcohol use: Yes   Drug use: No   Sexual activity: Yes    Birth control/protection: None  Other Topics Concern   Not on file  Social History Narrative   Not on file   Social Determinants of Health   Financial Resource Strain: Not on file  Food Insecurity: Not on file  Transportation Needs: Not on file  Physical Activity: Not on file  Stress: Not on file  Social Connections: Not on file  Intimate Partner Violence: Not on file    Outpatient Medications Prior to Visit  Medication Sig Dispense Refill   ferrous sulfate 325 (65 FE) MG EC tablet Take 325 mg by mouth daily with breakfast.     hydrocortisone 2.5 % cream APPLY TO THE AFFECTED AREA(S) 2 TIMES DAILY 30 g 5  ibuprofen (ADVIL) 800 MG tablet TAKE 1 TABLET BY MOUTH EVERY 8 HOURS AS NEEDED FOR MODERATE PAIN FOR UP TO 14 DAYS ON FULL STOMACH 42 tablet 0   lisinopril-hydrochlorothiazide (ZESTORETIC) 20-25 MG tablet TAKE 1 TABLET BY MOUTH ONCE DAILY 90 tablet 3   metoprolol succinate (TOPROL-XL) 25 MG 24 hr tablet TAKE 1 TABLET BY MOUTH DAILY 90 tablet 3   Multiple Vitamin (MULTIVITAMIN) tablet Take 1 tablet by mouth daily.     traZODone (DESYREL) 50 MG tablet TAKE 1 TABLET BY MOUTH AT BEDTIME AS NEEDED FOR SLEEP 90 tablet 1   No facility-administered medications prior to visit.    No Known Allergies  Review of Systems  Constitutional:  Negative for chills, fever and malaise/fatigue.  Respiratory:  Negative for cough and shortness of breath.   Cardiovascular:   Negative for chest pain, palpitations and leg swelling.  Gastrointestinal:  Negative for abdominal pain, blood in stool, constipation, diarrhea, nausea and vomiting.  Genitourinary:        Increased vaginal bleeding   Skin: Negative.   Psychiatric/Behavioral:  Negative for depression. The patient is not nervous/anxious.   All other systems reviewed and are negative.     Objective:    Physical Exam Vitals reviewed.  Constitutional:      General: She is not in acute distress.    Appearance: Normal appearance.  HENT:     Head: Normocephalic.  Cardiovascular:     Rate and Rhythm: Normal rate and regular rhythm.     Pulses: Normal pulses.     Heart sounds: Normal heart sounds.     Comments: No obvious peripheral edema Pulmonary:     Effort: Pulmonary effort is normal.     Breath sounds: Normal breath sounds.  Genitourinary:    General: Normal vulva.     Comments: Small amount of blood with clot formation noted in the vaginal vault. Negative CMT. No other vaginal discharge noted. No lesions. Skin:    General: Skin is warm and dry.     Capillary Refill: Capillary refill takes less than 2 seconds.  Neurological:     Mental Status: She is alert.  Psychiatric:        Mood and Affect: Mood normal.        Behavior: Behavior normal.        Thought Content: Thought content normal.        Judgment: Judgment normal.    BP (!) 102/58   Pulse (!) 109   Temp 97.7 F (36.5 C)   Ht _0  (1.651 m)   Wt 198 lb 0.4 oz (89.8 kg)   LMP 10/01/2020   SpO2 100%   BMI 32.95 kg/m  Wt Readings from Last 3 Encounters:  10/02/20 198 lb 0.4 oz (89.8 kg)  04/20/20 199 lb (90.3 kg)  01/20/20 201 lb 6.4 oz (91.4 kg)    Immunization History  Administered Date(s) Administered   Influenza-Unspecified 11/08/2019   PFIZER(Purple Top)SARS-COV-2 Vaccination 09/28/2019, 10/20/2019    Diabetic Foot Exam - Simple   No data filed     Lab Results  Component Value Date   TSH 1.830 11/28/2019    Lab Results  Component Value Date   WBC 5.2 04/20/2020   HGB 11.3 04/20/2020   HCT 35.1 04/20/2020   MCV 91 04/20/2020   PLT 392 04/20/2020   Lab Results  Component Value Date   NA 137 04/20/2020   K 4.1 04/20/2020   CO2 22 08/29/2018   GLUCOSE 136 (H)  04/20/2020   BUN 27 (H) 04/20/2020   CREATININE 1.45 (H) 04/20/2020   BILITOT 0.2 04/20/2020   ALKPHOS 107 04/20/2020   AST 148 (H) 04/20/2020   ALT 43 (H) 08/29/2018   PROT 8.3 04/20/2020   ALBUMIN 4.4 04/20/2020   CALCIUM 10.3 (H) 04/20/2020   EGFR 45 (L) 04/20/2020   Lab Results  Component Value Date   CHOL 242 (H) 04/20/2020   CHOL 220 (H) 09/05/2019   CHOL 220 (H) 08/29/2018   Lab Results  Component Value Date   HDL 46 04/20/2020   HDL 54 09/05/2019   HDL 61 08/29/2018   Lab Results  Component Value Date   LDLCALC 167 (H) 04/20/2020   LDLCALC 139 (H) 09/05/2019   LDLCALC 134 (H) 08/29/2018   Lab Results  Component Value Date   TRIG 159 (H) 04/20/2020   TRIG 149 09/05/2019   TRIG 127 08/29/2018   Lab Results  Component Value Date   CHOLHDL 5.3 (H) 04/20/2020   CHOLHDL 4.1 09/05/2019   CHOLHDL 3.6 08/29/2018   Lab Results  Component Value Date   HGBA1C 6.2 (A) 10/02/2020   HGBA1C 6.2 10/02/2020   HGBA1C 6.2 10/02/2020   HGBA1C 6.2 10/02/2020       Assessment & Plan:   Problem List Items Addressed This Visit       Endocrine   Diabetes mellitus ([place]) - Primary   Relevant Orders   HgB A1c (Completed)   CBC with Differential   Comprehensive metabolic panel   Other Visit Diagnoses     Abnormal vaginal bleeding       Relevant Orders   CBC with Differential   Comprehensive metabolic panel   NuSwab BV and Candida, NAA   POCT URINALYSIS DIP (CLINITEK) (Completed) Patient informed to monitor bleeding.  If symptoms worsen or do not improve over the next week, informed to return to the clinic or the emergency department for additional evaluation and management. Maintain upcoming follow up  on 9/14        I am having Oneal Deputy maintain her ferrous sulfate, multivitamin, hydrocortisone, ibuprofen, traZODone, metoprolol succinate, and lisinopril-hydrochlorothiazide.  No orders of the defined types were placed in this encounter.    Teena Dunk, NP

## 2020-10-02 NOTE — Patient Instructions (Signed)
You were seen at the [facility] today for your abnormal vaginal bleeding. We collected labs  Lab Orders         CBC with Differential         Comprehensive metabolic panel         HgB A1c    today.  Your results will be available via MyChart. No medications were prescribed. Please continue taking regularly prescribed medications as discussed during your visit. [place] follow up as needed.

## 2020-10-03 LAB — COMPREHENSIVE METABOLIC PANEL
ALT: 151 IU/L — ABNORMAL HIGH (ref 0–32)
AST: 290 IU/L — ABNORMAL HIGH (ref 0–40)
Albumin/Globulin Ratio: 1.3 (ref 1.2–2.2)
Albumin: 4.5 g/dL (ref 3.8–4.8)
Alkaline Phosphatase: 93 IU/L (ref 44–121)
BUN/Creatinine Ratio: 14 (ref 9–23)
BUN: 17 mg/dL (ref 6–24)
Bilirubin Total: 0.3 mg/dL (ref 0.0–1.2)
CO2: 20 mmol/L (ref 20–29)
Calcium: 9.4 mg/dL (ref 8.7–10.2)
Chloride: 101 mmol/L (ref 96–106)
Creatinine, Ser: 1.18 mg/dL — ABNORMAL HIGH (ref 0.57–1.00)
Globulin, Total: 3.5 g/dL (ref 1.5–4.5)
Glucose: 126 mg/dL — ABNORMAL HIGH (ref 65–99)
Potassium: 4.1 mmol/L (ref 3.5–5.2)
Sodium: 138 mmol/L (ref 134–144)
Total Protein: 8 g/dL (ref 6.0–8.5)
eGFR: 58 mL/min/{1.73_m2} — ABNORMAL LOW (ref >59)

## 2020-10-03 LAB — CBC WITH DIFFERENTIAL/PLATELET
Basophils Absolute: 0 10*3/uL (ref 0.0–0.2)
Basos: 0 %
EOS (ABSOLUTE): 0.1 10*3/uL (ref 0.0–0.4)
Eos: 2 %
Hematocrit: 30.2 % — ABNORMAL LOW (ref 34.0–46.6)
Hemoglobin: 9.8 g/dL — ABNORMAL LOW (ref 11.1–15.9)
Immature Grans (Abs): 0 10*3/uL (ref 0.0–0.1)
Immature Granulocytes: 0 %
Lymphocytes Absolute: 1.6 10*3/uL (ref 0.7–3.1)
Lymphs: 25 %
MCH: 28.7 pg (ref 26.6–33.0)
MCHC: 32.5 g/dL (ref 31.5–35.7)
MCV: 89 fL (ref 79–97)
Monocytes Absolute: 0.8 10*3/uL (ref 0.1–0.9)
Monocytes: 14 %
Neutrophils Absolute: 3.6 10*3/uL (ref 1.4–7.0)
Neutrophils: 59 %
Platelets: 371 10*3/uL (ref 150–450)
RBC: 3.41 x10E6/uL — ABNORMAL LOW (ref 3.77–5.28)
RDW: 13.9 % (ref 11.7–15.4)
WBC: 6.2 10*3/uL (ref 3.4–10.8)

## 2020-10-04 LAB — NUSWAB BV AND CANDIDA, NAA
Candida albicans, NAA: NEGATIVE
Candida glabrata, NAA: NEGATIVE
Megasphaera 1: HIGH Score — AB

## 2020-10-05 ENCOUNTER — Other Ambulatory Visit: Payer: Self-pay | Admitting: Nurse Practitioner

## 2020-10-05 DIAGNOSIS — R748 Abnormal levels of other serum enzymes: Secondary | ICD-10-CM

## 2020-10-09 ENCOUNTER — Ambulatory Visit (HOSPITAL_COMMUNITY): Payer: No Typology Code available for payment source

## 2020-10-16 ENCOUNTER — Ambulatory Visit (HOSPITAL_COMMUNITY)
Admission: RE | Admit: 2020-10-16 | Discharge: 2020-10-16 | Disposition: A | Discharge status: Home / Self Care | Payer: No Typology Code available for payment source | Source: Ambulatory Visit | Attending: Nurse Practitioner | Admitting: Nurse Practitioner

## 2020-10-16 ENCOUNTER — Other Ambulatory Visit: Payer: Self-pay

## 2020-10-16 DIAGNOSIS — R748 Abnormal levels of other serum enzymes: Secondary | ICD-10-CM | POA: Diagnosis not present

## 2020-10-21 ENCOUNTER — Ambulatory Visit: Payer: Self-pay | Admitting: Nurse Practitioner

## 2020-11-13 ENCOUNTER — Other Ambulatory Visit: Payer: Self-pay | Admitting: Nurse Practitioner

## 2020-11-13 ENCOUNTER — Other Ambulatory Visit (HOSPITAL_COMMUNITY): Payer: Self-pay

## 2020-11-13 ENCOUNTER — Other Ambulatory Visit: Payer: Self-pay | Admitting: Physician Assistant

## 2020-11-13 MED ORDER — METOPROLOL SUCCINATE ER 25 MG PO TB24
25.0000 mg | ORAL_TABLET | Freq: Every day | ORAL | 0 refills | Status: DC
  Filled 2020-11-13: qty 30, 30d supply, fill #0

## 2020-11-13 MED ORDER — IBUPROFEN 800 MG PO TABS
ORAL_TABLET | ORAL | 0 refills | Status: DC
  Filled 2020-11-13: qty 42, 14d supply, fill #0

## 2020-12-16 ENCOUNTER — Other Ambulatory Visit (HOSPITAL_COMMUNITY): Payer: Self-pay

## 2020-12-16 ENCOUNTER — Other Ambulatory Visit: Payer: Self-pay | Admitting: Cardiovascular Disease

## 2020-12-16 MED ORDER — METOPROLOL SUCCINATE ER 25 MG PO TB24
25.0000 mg | ORAL_TABLET | Freq: Every day | ORAL | 0 refills | Status: DC
  Filled 2020-12-16: qty 15, 15d supply, fill #0

## 2021-01-04 ENCOUNTER — Encounter: Payer: Self-pay | Admitting: Nurse Practitioner

## 2021-01-04 ENCOUNTER — Other Ambulatory Visit: Payer: Self-pay

## 2021-01-04 ENCOUNTER — Ambulatory Visit (INDEPENDENT_AMBULATORY_CARE_PROVIDER_SITE_OTHER): Payer: No Typology Code available for payment source | Admitting: Nurse Practitioner

## 2021-01-04 ENCOUNTER — Other Ambulatory Visit (HOSPITAL_COMMUNITY): Payer: Self-pay

## 2021-01-04 VITALS — BP 117/74 | HR 100 | Temp 98.2°F | Wt 202.1 lb

## 2021-01-04 DIAGNOSIS — I10 Essential (primary) hypertension: Secondary | ICD-10-CM | POA: Diagnosis not present

## 2021-01-04 DIAGNOSIS — N898 Other specified noninflammatory disorders of vagina: Secondary | ICD-10-CM | POA: Diagnosis not present

## 2021-01-04 DIAGNOSIS — E1169 Type 2 diabetes mellitus with other specified complication: Secondary | ICD-10-CM | POA: Diagnosis not present

## 2021-01-04 DIAGNOSIS — R748 Abnormal levels of other serum enzymes: Secondary | ICD-10-CM

## 2021-01-04 DIAGNOSIS — Z1211 Encounter for screening for malignant neoplasm of colon: Secondary | ICD-10-CM

## 2021-01-04 LAB — POCT URINALYSIS DIP (CLINITEK)
Bilirubin, UA: NEGATIVE
Glucose, UA: NEGATIVE mg/dL
Ketones, POC UA: NEGATIVE mg/dL
Leukocytes, UA: NEGATIVE
Nitrite, UA: NEGATIVE
POC PROTEIN,UA: NEGATIVE
Spec Grav, UA: >=1.03 — AB (ref 1.010–1.025)
Urobilinogen, UA: 0.2 E.U./dL
pH, UA: 5.5 (ref 5.0–8.0)

## 2021-01-04 LAB — POCT GLYCOSYLATED HEMOGLOBIN (HGB A1C)
HbA1c POC (<> result, manual entry): 6.1 % (ref 4.0–5.6)
HbA1c, POC (controlled diabetic range): 6.1 % (ref 0.0–7.0)
HbA1c, POC (prediabetic range): 6.1 % (ref 5.7–6.4)
Hemoglobin A1C: 6.1 % — AB (ref 4.0–5.6)

## 2021-01-04 MED ORDER — FLUCONAZOLE 150 MG PO TABS
150.0000 mg | ORAL_TABLET | Freq: Once | ORAL | 0 refills | Status: AC
  Filled 2021-01-04: qty 1, 1d supply, fill #0

## 2021-01-04 MED ORDER — TRAZODONE HCL 50 MG PO TABS
ORAL_TABLET | Freq: Every evening | ORAL | 1 refills | Status: DC | PRN
  Filled 2021-01-04: qty 90, 90d supply, fill #0
  Filled 2021-09-01: qty 90, 90d supply, fill #1

## 2021-01-04 MED ORDER — METOPROLOL SUCCINATE ER 25 MG PO TB24
25.0000 mg | ORAL_TABLET | Freq: Every day | ORAL | 0 refills | Status: DC
  Filled 2021-01-04: qty 15, 15d supply, fill #0

## 2021-01-04 NOTE — Progress Notes (Signed)
Established Patient Office Visit  Subjective:  Patient ID: Kaylee Maxwell, female    DOB: May 06, 1975  Age: 45 y.o. MRN: [id number]  CC:  Chief Complaint  Patient presents with   Follow-up    Pt is here for a FU would like a cream for vaginal itching no other issues or concerns.    HPI Kaylee Maxwell presents for follow up. She  has a past medical history of Anxiety (03/04/2018), Breast cancer screening (03/04/2018), Heart palpitations (03/04/2018), Hypertension, Irregular heartbeat (03/04/2018), Obesity, and Scalp mass (10/09/2012).   Vaginitis Patient presents for evaluation of an abnormal vaginal discharge. Symptoms have been present for 7 days. Vaginal symptoms: local irritation. Contraception: none. She denies blisters, bumps, lesions, and urinary symptoms of chills. Sexually transmitted infection risk: very low risk of STD exposure. Menstrual flow: regular every 28-30 days.  She is following up for hypertension . She is prescribed Zestoretic. She is on Metoprolol 25 mg for abnormal heart rates. She does not feel like the metoprolol is needed. She would like to discontinue. She has a BP monitor at home but does not routinely monitor.  Denies headache, dizziness, visual changes, shortness of breath, dyspnea on exertion, chest pain, nausea, vomiting or any edema.   Past Medical History:  Diagnosis Date   Anxiety 03/04/2018   Breast cancer screening 03/04/2018   Heart palpitations 03/04/2018   Monitor 9/21: normal sinus rhythm, sinus tachycardia, Avg HR 110; occ PVCs   Hypertension    Irregular heartbeat 03/04/2018   Obesity    Scalp mass 10/09/2012    Past Surgical History:  Procedure Laterality Date   ANKLE FRACTURE SURGERY     cyst removal from scalp     left leg bone removal      Family History  Problem Relation Age of Onset   Diabetes Mother    Hypertension Mother    Obesity Mother    Hypertension Father    Cancer Paternal Aunt        pancreatic   Cancer Maternal  Grandmother        lung   Autism spectrum disorder Sister     Social History   Socioeconomic History   Marital status: Single    Spouse name: Not on file   Number of children: 2   Years of education: Not on file   Highest education level: Associate degree: academic program  Occupational History   Not on file  Tobacco Use   Smoking status: Former    Packs/day: 0.50    Years: 18.00    Pack years: 9.00    Types: Cigarettes   Smokeless tobacco: Never  Vaping Use   Vaping Use: Every day   Substances: Nicotine, Flavoring  Substance and Sexual Activity   Alcohol use: Yes   Drug use: No   Sexual activity: Yes    Birth control/protection: None  Other Topics Concern   Not on file  Social History Narrative   Not on file   Social Determinants of Health   Financial Resource Strain: Not on file  Food Insecurity: Not on file  Transportation Needs: Not on file  Physical Activity: Not on file  Stress: Not on file  Social Connections: Not on file  Intimate Partner Violence: Not on file    Outpatient Medications Prior to Visit  Medication Sig Dispense Refill   ferrous sulfate 325 (65 FE) MG EC tablet Take 325 mg by mouth daily with breakfast.     hydrocortisone 2.5 % cream APPLY TO  THE AFFECTED AREA(S) 2 TIMES DAILY 30 g 5   ibuprofen (ADVIL) 800 MG tablet TAKE 1 TABLET BY MOUTH EVERY 8 HOURS AS NEEDED FOR MODERATE PAIN FOR UP TO 14 DAYS ON FULL STOMACH 42 tablet 0   lisinopril-hydrochlorothiazide (ZESTORETIC) 20-25 MG tablet TAKE 1 TABLET BY MOUTH ONCE DAILY 90 tablet 3   Multiple Vitamin (MULTIVITAMIN) tablet Take 1 tablet by mouth daily.     metoprolol succinate (TOPROL-XL) 25 MG 24 hr tablet Take 1 tablet by mouth daily. Make appointment for further refills. 15 tablet 0   traZODone (DESYREL) 50 MG tablet TAKE 1 TABLET BY MOUTH AT BEDTIME AS NEEDED FOR SLEEP 90 tablet 1   No facility-administered medications prior to visit.    No Known Allergies  ROS Review of  Systems    Objective:    Physical Exam Constitutional:      Appearance: She is obese.  HENT:     Head: Normocephalic and atraumatic.  Cardiovascular:     Rate and Rhythm: Normal rate and regular rhythm.     Pulses: Normal pulses.     Heart sounds: Normal heart sounds.  Pulmonary:     Effort: Pulmonary effort is normal.     Breath sounds: Normal breath sounds.  Musculoskeletal:        General: Normal range of motion.     Cervical back: Normal range of motion.  Skin:    General: Skin is warm and dry.     Capillary Refill: Capillary refill takes less than 2 seconds.  Neurological:     General: No focal deficit present.     Mental Status: She is alert and oriented to person, place, and time.  Psychiatric:        Mood and Affect: Mood normal.        Behavior: Behavior normal.        Thought Content: Thought content normal.        Judgment: Judgment normal.    BP 117/74   Pulse 100   Temp 98.2 F (36.8 C)   Wt 202 lb 2 oz (91.7 kg)   SpO2 100%   BMI 33.64 kg/m  Wt Readings from Last 3 Encounters:  01/04/21 202 lb 2 oz (91.7 kg)  10/02/20 198 lb 0.4 oz (89.8 kg)  04/20/20 199 lb (90.3 kg)     Health Maintenance Due  Topic Date Due   INFLUENZA VACCINE  09/07/2020   OPHTHALMOLOGY EXAM  10/08/2020    There are no preventive care reminders to display for this patient.  Lab Results  Component Value Date   TSH 1.830 11/28/2019   Lab Results  Component Value Date   WBC 6.2 10/02/2020   HGB 9.8 (L) 10/02/2020   HCT 30.2 (L) 10/02/2020   MCV 89 10/02/2020   PLT 371 10/02/2020   Lab Results  Component Value Date   NA 138 10/02/2020   K 4.1 10/02/2020   CO2 20 10/02/2020   GLUCOSE 126 (H) 10/02/2020   BUN 17 10/02/2020   CREATININE 1.18 (H) 10/02/2020   BILITOT 0.3 10/02/2020   ALKPHOS 93 10/02/2020   AST 290 (H) 10/02/2020   ALT 151 (H) 10/02/2020   PROT 8.0 10/02/2020   ALBUMIN 4.5 10/02/2020   CALCIUM 9.4 10/02/2020   EGFR 58 (L) 10/02/2020   Lab  Results  Component Value Date   CHOL 242 (H) 04/20/2020   Lab Results  Component Value Date   HDL 46 04/20/2020   Lab Results  Component  Value Date   LDLCALC 167 (H) 04/20/2020   Lab Results  Component Value Date   TRIG 159 (H) 04/20/2020   Lab Results  Component Value Date   CHOLHDL 5.3 (H) 04/20/2020   Lab Results  Component Value Date   HGBA1C 6.1 (A) 01/04/2021   HGBA1C 6.1 01/04/2021   HGBA1C 6.1 01/04/2021   HGBA1C 6.1 01/04/2021      Assessment & Plan:   Problem List Items Addressed This Visit       Cardiovascular and Mediastinum   Essential hypertension Encouraged on going compliance with current medication regimen Encouraged home monitoring and recording BP <130/80 Eating a heart-healthy diet with less salt Encouraged regular physical activity  Recommend Weight loss     Relevant Medications   metoprolol succinate (TOPROL-XL) 25 MG 24 hr tablet   Other Relevant Orders   Comp. Metabolic Panel (12)     Endocrine   Diabetes mellitus ([place]) - Primary Controlled  A1c 6.1% ' Diet controlled Lifestyle modification with healthy diet (fewer calories, more high fiber foods, whole grains and non-starchy vegetables, lower fat meat and fish, low-fat diary include healthy oils) regular exercise (physical activity) and weight loss Opthalmology exam pending Home BP monitoring also encouraged goal <130/80    Relevant Orders   HgB A1c (Completed)   POCT URINALYSIS DIP (CLINITEK) (Completed)   Other Visit Diagnoses     Vaginal irritation     Diflucan 150 mg x1    Relevant Orders   NuSwab Vaginitis Plus (VG+)   Elevated liver enzymes       Relevant Orders   Comp. Metabolic Panel (12)   Screening for colon cancer       Relevant Orders   Cologuard       Meds ordered this encounter  Medications   fluconazole (DIFLUCAN) 150 MG tablet    Sig: Take 1 tablet (150 mg total) by mouth once for 1 dose.    Dispense:  1 tablet    Refill:  0    Order Specific  Question:   Supervising Provider    Answer:   Tresa Garter [[id number]]   DISCONTD: metoprolol succinate (TOPROL-XL) 25 MG 24 hr tablet    Sig: Take 1 tablet by mouth daily. Make appointment for further refills.    Dispense:  15 tablet    Refill:  0    Please call our office to schedule an overdue appointment with Dr. Acie Fredrickson before anymore refills. [phone]. Thank you 2nd attempt    Order Specific Question:   Supervising Provider    Answer:   Tresa Garter [[id number]]   traZODone (DESYREL) 50 MG tablet    Sig: TAKE 1 TABLET BY MOUTH AT BEDTIME AS NEEDED FOR SLEEP    Dispense:  90 tablet    Refill:  1    Order Specific Question:   Supervising Provider    Answer:   Tresa Garter [[id number]]   metoprolol succinate (TOPROL-XL) 25 MG 24 hr tablet    Sig: Take 1 tablet by mouth daily. Make appointment for further refills.    Dispense:  15 tablet    Refill:  0    Order Specific Question:   Supervising Provider    Answer:   Tresa Garter [id number]    Follow-up: Return in about 3 months (around 04/06/2021) for Follow up HTN [id number].    Vevelyn Francois, NP

## 2021-01-04 NOTE — Patient Instructions (Signed)
Vaginitis Vaginitis is irritation and swelling of the vagina. Treatment will depend on the cause. What are the causes? It can be caused by: Bacteria. Yeast. A parasite. A virus. Low hormone levels. Bubble baths, scented tampons, and feminine sprays. Other things can change the balance of the yeast and bacteria that live in the vagina. These include: Antibiotic medicines. Not being clean enough. Some birth control methods. Sex. Infection. Diabetes. A weakened body defense system (immune system). What increases the risk? Smoking or being around someone who smokes. Using washes (douches), scented tampons, or scented pads. Wearing tight pants or thong underwear. Using birth control pills or an IUD. Having sex without a condom or having a lot of partners. Having an STI. Using a certain product to kill sperm (nonoxynol-9). Eating foods that are high in sugar. Having diabetes. Having low levels of a female hormone. Having a weakened body defense system. Being pregnant or breastfeeding. What are the signs or symptoms? Fluid coming from the vagina that is not normal. A bad smell. Itching, pain, or swelling. Pain with sex. Pain or burning when you pee (urinate). Sometimes there are no symptoms. How is this treated? Treatment may include: Antibiotic creams or pills. Antifungal medicines. Medicines to ease symptoms if you have a virus. Your sex partner should also be treated. Estrogen medicines. Avoiding scented soaps, sprays, or douches. Stopping use of products that caused irritation and then using a cream to treat symptoms. Follow these instructions at home: Lifestyle Keep the area around your vagina clean and dry. Avoid using soap. Rinse the area with water. Until your doctor says it is okay: Do not use washes for the vagina. Do not use tampons. Do not have sex. Wipe from front to back after going to the bathroom. When your doctor says it is okay, practice safe sex and  use condoms. General instructions Take over-the-counter and prescription medicines only as told by your doctor. If you were prescribed an antibiotic medicine, take or use it as told by your doctor. Do not stop taking or using it even if you start to feel better. Keep all follow-up visits. How is this prevented? Do not use things that can irritate the vagina, such as fabric softeners. Avoid these products if they are scented: Sprays. Detergents. Tampons. Products for cleaning the vagina. Soaps or bubble baths. Let air reach your vagina. To do this: Wear cotton underwear. Do not wear: Underwear while you sleep. Tight pants. Thong underwear. Underwear or nylons without a cotton panel. Take off any wet clothing, such as bathing suits, as soon as you can. Practice safe sex and use condoms. Contact a doctor if: You have pain in your belly or in the area between your hips. You have a fever or chills. Your symptoms last for more than 2-3 days. Get help right away if: You have a fever and your symptoms get worse all of a sudden. Summary Vaginitis is irritation and swelling of the vagina. Treatment will depend on the cause of the condition. Do not use washes or tampons or have sex until your doctor says it is okay. This information is not intended to replace advice given to you by your health care provider. Make sure you discuss any questions you have with your health care provider. Document Revised: 07/25/2019 Document Reviewed: 07/25/2019 Elsevier Patient Education  [place].

## 2021-01-05 ENCOUNTER — Other Ambulatory Visit (HOSPITAL_COMMUNITY): Payer: Self-pay

## 2021-01-05 LAB — COMP. METABOLIC PANEL (12)
AST: 86 IU/L — ABNORMAL HIGH (ref 0–40)
Albumin/Globulin Ratio: 1.3 (ref 1.2–2.2)
Albumin: 4.4 g/dL (ref 3.8–4.8)
Alkaline Phosphatase: 91 IU/L (ref 44–121)
BUN/Creatinine Ratio: 22 (ref 9–23)
BUN: 26 mg/dL — ABNORMAL HIGH (ref 6–24)
Bilirubin Total: 0.3 mg/dL (ref 0.0–1.2)
Calcium: 10.4 mg/dL — ABNORMAL HIGH (ref 8.7–10.2)
Chloride: 102 mmol/L (ref 96–106)
Creatinine, Ser: 1.19 mg/dL — ABNORMAL HIGH (ref 0.57–1.00)
Globulin, Total: 3.5 g/dL (ref 1.5–4.5)
Glucose: 134 mg/dL — ABNORMAL HIGH (ref 70–99)
Potassium: 4.2 mmol/L (ref 3.5–5.2)
Sodium: 139 mmol/L (ref 134–144)
Total Protein: 7.9 g/dL (ref 6.0–8.5)
eGFR: 57 mL/min/{1.73_m2} — ABNORMAL LOW (ref >59)

## 2021-01-06 ENCOUNTER — Other Ambulatory Visit (HOSPITAL_COMMUNITY): Payer: Self-pay

## 2021-01-06 ENCOUNTER — Other Ambulatory Visit: Payer: Self-pay | Admitting: Nurse Practitioner

## 2021-01-06 MED ORDER — METRONIDAZOLE 500 MG PO TABS
500.0000 mg | ORAL_TABLET | Freq: Three times a day (TID) | ORAL | 0 refills | Status: DC
  Filled 2021-01-06: qty 21, 7d supply, fill #0

## 2021-01-07 LAB — NUSWAB VAGINITIS PLUS (VG+)
Atopobium vaginae: HIGH Score — AB
Candida albicans, NAA: NEGATIVE
Candida glabrata, NAA: NEGATIVE
Chlamydia trachomatis, NAA: NEGATIVE
Megasphaera 1: HIGH Score — AB
Neisseria gonorrhoeae, NAA: NEGATIVE
Trich vag by NAA: NEGATIVE

## 2021-02-23 LAB — COLOGUARD: COLOGUARD: NEGATIVE

## 2021-03-02 ENCOUNTER — Other Ambulatory Visit (HOSPITAL_COMMUNITY): Payer: Self-pay

## 2021-03-02 ENCOUNTER — Other Ambulatory Visit: Payer: Self-pay | Admitting: Nurse Practitioner

## 2021-03-02 MED FILL — Hydrocortisone Cream 2.5%: CUTANEOUS | 15 days supply | Qty: 30 | Fill #0 | Status: AC

## 2021-03-04 ENCOUNTER — Other Ambulatory Visit (HOSPITAL_COMMUNITY): Payer: Self-pay

## 2021-03-05 ENCOUNTER — Other Ambulatory Visit (HOSPITAL_COMMUNITY): Payer: Self-pay

## 2021-03-05 MED ORDER — IBUPROFEN 800 MG PO TABS
ORAL_TABLET | ORAL | 0 refills | Status: DC
  Filled 2021-03-05: qty 42, 14d supply, fill #0

## 2021-03-17 ENCOUNTER — Other Ambulatory Visit (HOSPITAL_COMMUNITY): Payer: Self-pay

## 2021-03-23 ENCOUNTER — Ambulatory Visit (INDEPENDENT_AMBULATORY_CARE_PROVIDER_SITE_OTHER): Payer: No Typology Code available for payment source | Admitting: Podiatry

## 2021-03-23 ENCOUNTER — Other Ambulatory Visit (HOSPITAL_COMMUNITY): Payer: Self-pay

## 2021-03-23 ENCOUNTER — Ambulatory Visit: Payer: No Typology Code available for payment source

## 2021-03-23 ENCOUNTER — Other Ambulatory Visit: Payer: Self-pay

## 2021-03-23 DIAGNOSIS — M899 Disorder of bone, unspecified: Secondary | ICD-10-CM | POA: Diagnosis not present

## 2021-03-23 DIAGNOSIS — M2012 Hallux valgus (acquired), left foot: Secondary | ICD-10-CM | POA: Diagnosis not present

## 2021-03-23 DIAGNOSIS — M1000 Idiopathic gout, unspecified site: Secondary | ICD-10-CM | POA: Diagnosis not present

## 2021-03-23 MED ORDER — MELOXICAM 15 MG PO TABS
15.0000 mg | ORAL_TABLET | Freq: Every day | ORAL | 0 refills | Status: DC
Start: 2021-03-23 — End: 2021-03-26
  Filled 2021-03-23 (×2): qty 30, 30d supply, fill #0

## 2021-03-23 NOTE — Patient Instructions (Signed)
Meloxicam Tablets- use only as needed What is this medication? MELOXICAM (mel OX i cam) treats mild to moderate pain, inflammation, or arthritis. It works by decreasing inflammation. It belongs to a group of medications called NSAIDs. This medicine may be used for other purposes; ask your health care provider or pharmacist if you have questions. COMMON BRAND NAME(S): Mobic What should I tell my care team before I take this medication? They need to know if you have any of these conditions: Asthma (lung or breathing disease) Bleeding disorder Coronary artery bypass graft (CABG) within the past 2 weeks Dehydration Heart attack Heart disease Heart failure High blood pressure If you often drink alcohol Kidney disease Liver disease Smoke tobacco cigarettes Stomach bleeding Stomach ulcers, other stomach or intestine problems Take medications that treat or prevent blood clots Taking other steroids like dexamethasone or prednisone An unusual or allergic reaction to meloxicam, other medications, foods, dyes, or preservatives Pregnant or trying to get pregnant Breast-feeding How should I use this medication? Take this medication by mouth. Take it as directed on the prescription label at the same time every day. You can take it with or without food. If it upsets your stomach, take it with food. Do not use it more often than directed. There may be unused or extra doses in the bottle after you finish your treatment. Talk to your care team if you have questions about your dose. A special MedGuide will be given to you by the pharmacist with each prescription and refill. Be sure to read this information carefully each time. Talk to your care team about the use of this medication in children. Special care may be needed. Patients over 40 years of age may have a stronger reaction and need a smaller dose. Overdosage: If you think you have taken too much of this medicine contact a poison control center or  emergency room at once. NOTE: This medicine is only for you. Do not share this medicine with others. What if I miss a dose? If you miss a dose, take it as soon as you can. If it is almost time for your next dose, take only that dose. Do not take double or extra doses. What may interact with this medication? Do not take this medication with any of the following: Cidofovir Ketorolac This medication may also interact with the following: Aspirin and aspirin-like medications Certain medications for blood pressure, heart disease, irregular heart beat Certain medications for depression, anxiety, or psychotic disturbances Certain medications that treat or prevent blood clots like warfarin, enoxaparin, dalteparin, apixaban, dabigatran, rivaroxaban Cyclosporine Diuretics Fluconazole Lithium Methotrexate Other NSAIDs, medications for pain and inflammation, like ibuprofen and naproxen Pemetrexed This list may not describe all possible interactions. Give your health care provider a list of all the medicines, herbs, non-prescription drugs, or dietary supplements you use. Also tell them if you smoke, drink alcohol, or use illegal drugs. Some items may interact with your medicine. What should I watch for while using this medication? Visit your care team for regular checks on your progress. Tell your care team if your symptoms do not start to get better or if they get worse. Do not take other medications that contain aspirin, ibuprofen, or naproxen with this medication. Side effects such as stomach upset, nausea, or ulcers may be more likely to occur. Many non-prescription medications contain aspirin, ibuprofen, or naproxen. Always read labels carefully. This medication can cause serious ulcers and bleeding in the stomach. It can happen with no warning. Smoking, drinking  alcohol, older age, and poor health can also increase risks. Call your care team right away if you have stomach pain or blood in your vomit  or stool. This medication does not prevent a heart attack or stroke. This medication may increase the chance of a heart attack or stroke. The chance may increase the longer you use this medication or if you have heart disease. If you take aspirin to prevent a heart attack or stroke, talk to your care team about using this medication. Alcohol may interfere with the effect of this medication. Avoid alcoholic drinks. This medication may cause serious skin reactions. They can happen weeks to months after starting the medication. Contact your care team right away if you notice fevers or flu-like symptoms with a rash. The rash may be red or purple and then turn into blisters or peeling of the skin. Or, you might notice a red rash with swelling of the face, lips or lymph nodes in your neck or under your arms. Talk to your care team if you are pregnant before taking this medication. Taking this medication between weeks 20 and 30 of pregnancy may harm your unborn baby. Your care team will monitor you closely if you need to take it. After 30 weeks of pregnancy, do not take this medication. You may get drowsy or dizzy. Do not drive, use machinery, or do anything that needs mental alertness until you know how this medication affects you. Do not stand up or sit up quickly, especially if you are an older patient. This reduces the risk of dizzy or fainting spells. Be careful brushing or flossing your teeth or using a toothpick because you may get an infection or bleed more easily. If you have any dental work done, tell your dentist you are receiving this medication. This medication may make it more difficult to get pregnant. Talk to your care team if you are concerned about your fertility. What side effects may I notice from receiving this medication? Side effects that you should report to your care team as soon as possible: Allergic reactions--skin rash, itching, hives, swelling of the face, lips, tongue, or  throat Bleeding--bloody or black, tar-like stools, vomiting blood or brown material that looks like coffee grounds, red or dark brown urine, small red or purple spots on skin, unusual bruising or bleeding Heart attack--pain or tightness in the chest, shoulders, arms, or jaw, nausea, shortness of breath, cold or clammy skin, feeling faint or lightheaded Heart failure--shortness of breath, swelling of ankles, feet, or hands, sudden weight gain, unusual weakness or fatigue Increase in blood pressure Kidney injury--decrease in the amount of urine, swelling of the ankles, hands, or feet Liver injury--right upper belly pain, loss of appetite, nausea, light-colored stool, dark yellow or brown urine, yellowing skin or eyes, unusual weakness or fatigue Rash, fever, and swollen lymph nodes Redness, blistering, peeling, or loosening of the skin, including inside the mouth Stroke--sudden numbness or weakness of the face, arm, or leg, trouble speaking, confusion, trouble walking, loss of balance or coordination, dizziness, severe headache, change in vision Side effects that usually do not require medical attention (report to your care team if they continue or are bothersome): Diarrhea Nausea Upset stomach This list may not describe all possible side effects. Call your doctor for medical advice about side effects. You may report side effects to FDA at 1-800-FDA-1088. Where should I keep my medication? Keep out of the reach of children and pets. Store at room temperature between 20 and 25  degrees C (68 and 77 degrees F). Protect from moisture. Keep the container tightly closed. Get rid of any unused medication after the expiration date. To get rid of medications that are no longer needed or have expired: Take the medication to a medication take-back program. Check with your pharmacy or law enforcement to find a location. If you cannot return the medication, check the label or package insert to see if the  medication should be thrown out in the garbage or flushed down the toilet. If you are not sure, ask your care team. If it is safe to put it in the trash, empty the medication out of the container. Mix the medication with cat litter, dirt, coffee grounds, or other unwanted substance. Seal the mixture in a bag or container. Put it in the trash. NOTE: This sheet is a summary. It may not cover all possible information. If you have questions about this medicine, talk to your doctor, pharmacist, or health care provider.  2022 Elsevier/Gold Standard (2020-04-22 00:00:00)

## 2021-03-24 ENCOUNTER — Other Ambulatory Visit (HOSPITAL_COMMUNITY): Payer: Self-pay

## 2021-03-24 ENCOUNTER — Other Ambulatory Visit: Payer: Self-pay | Admitting: Podiatry

## 2021-03-24 LAB — BASIC METABOLIC PANEL
BUN/Creatinine Ratio: 15 (calc) (ref 6–22)
BUN: 21 mg/dL (ref 7–25)
CO2: 23 mmol/L (ref 20–32)
Calcium: 10 mg/dL (ref 8.6–10.2)
Chloride: 100 mmol/L (ref 98–110)
Creat: 1.37 mg/dL — ABNORMAL HIGH (ref 0.50–0.99)
Glucose, Bld: 162 mg/dL — ABNORMAL HIGH (ref 65–99)
Potassium: 4 mmol/L (ref 3.5–5.3)
Sodium: 137 mmol/L (ref 135–146)

## 2021-03-24 LAB — CBC WITH DIFFERENTIAL/PLATELET
Absolute Monocytes: 597 cells/uL (ref 200–950)
Basophils Absolute: 9 cells/uL (ref 0–200)
Basophils Relative: 0.2 %
Eosinophils Absolute: 160 cells/uL (ref 15–500)
Eosinophils Relative: 3.4 %
HCT: 33.9 % — ABNORMAL LOW (ref 35.0–45.0)
Hemoglobin: 10.7 g/dL — ABNORMAL LOW (ref 11.7–15.5)
Lymphs Abs: 1119 cells/uL (ref 850–3900)
MCH: 27.5 pg (ref 27.0–33.0)
MCHC: 31.6 g/dL — ABNORMAL LOW (ref 32.0–36.0)
MCV: 87.1 fL (ref 80.0–100.0)
MPV: 10.1 fL (ref 7.5–12.5)
Monocytes Relative: 12.7 %
Neutro Abs: 2815 cells/uL (ref 1500–7800)
Neutrophils Relative %: 59.9 %
Platelets: 370 10*3/uL (ref 140–400)
RBC: 3.89 10*6/uL (ref 3.80–5.10)
RDW: 14.6 % (ref 11.0–15.0)
Total Lymphocyte: 23.8 %
WBC: 4.7 10*3/uL (ref 3.8–10.8)

## 2021-03-24 LAB — URIC ACID: Uric Acid, Serum: 12.5 mg/dL — ABNORMAL HIGH (ref 2.5–7.0)

## 2021-03-24 MED ORDER — ALLOPURINOL 100 MG PO TABS
100.0000 mg | ORAL_TABLET | Freq: Every day | ORAL | 0 refills | Status: DC
  Filled 2021-03-24: qty 30, 30d supply, fill #0

## 2021-03-25 ENCOUNTER — Other Ambulatory Visit (HOSPITAL_COMMUNITY): Payer: Self-pay

## 2021-03-26 ENCOUNTER — Other Ambulatory Visit: Payer: Self-pay | Admitting: Nurse Practitioner

## 2021-03-26 NOTE — Progress Notes (Signed)
Dr. Jacqualyn Posey, Thank you for the update.  We will further discuss and evaluate for the next follow-up visit. Have a good day. Dionisio David NP c

## 2021-03-27 NOTE — Progress Notes (Signed)
Subjective:   Patient ID: Kaylee Maxwell, female   DOB: 46 y.o.   MRN: [id number]   HPI 46 year old female presents the office today for evaluation of left foot bunion which has recently been getting worse in the last year.  She states has become more tender.  He is unable to wear certain shoes and walking makes it difficult.  She states that she is not sure if she has gout.  She has not had any recent treatment but the tenderness is been getting worse.  No current redness that she is noted.   Review of Systems  All other systems reviewed and are negative.  Past Medical History:  Diagnosis Date   Anxiety 03/04/2018   Breast cancer screening 03/04/2018   Heart palpitations 03/04/2018   Monitor 9/21: normal sinus rhythm, sinus tachycardia, Avg HR 110; occ PVCs   Hypertension    Irregular heartbeat 03/04/2018   Obesity    Scalp mass 10/09/2012    Past Surgical History:  Procedure Laterality Date   ANKLE FRACTURE SURGERY     cyst removal from scalp     left leg bone removal       Current Outpatient Medications:    allopurinol (ZYLOPRIM) 100 MG tablet, Take 1 tablet (100 mg total) by mouth daily., Disp: 30 tablet, Rfl: 0   ferrous sulfate 325 (65 FE) MG EC tablet, Take 325 mg by mouth daily with breakfast., Disp: , Rfl:    hydrocortisone 2.5 % cream, APPLY TO THE AFFECTED AREA(S) 2 TIMES DAILY, Disp: 30 g, Rfl: 5   lisinopril-hydrochlorothiazide (ZESTORETIC) 20-25 MG tablet, TAKE 1 TABLET BY MOUTH ONCE DAILY, Disp: 90 tablet, Rfl: 3   metoprolol succinate (TOPROL-XL) 25 MG 24 hr tablet, Take 1 tablet by mouth daily. Make appointment for further refills., Disp: 15 tablet, Rfl: 0   metroNIDAZOLE (FLAGYL) 500 MG tablet, Take 1 tablet (500 mg total) by mouth 3 (three) times daily., Disp: 21 tablet, Rfl: 0   Multiple Vitamin (MULTIVITAMIN) tablet, Take 1 tablet by mouth daily., Disp: , Rfl:    traZODone (DESYREL) 50 MG tablet, TAKE 1 TABLET BY MOUTH AT BEDTIME AS NEEDED FOR SLEEP, Disp: 90  tablet, Rfl: 1  No Known Allergies       Objective:  Physical Exam  General: AAO x3, NAD  Dermatological: Fluid-filled areas noted on the bunion area on the left side.  Mild edema present there is no ascending erythema or warmth.  There is no open lesions.  Vascular: Dorsalis Pedis artery and Posterior Tibial artery pedal pulses are 2/4 bilateral with immedate capillary fill time. There is no pain with calf compression, swelling, warmth, erythema.   Neruologic: Grossly intact via light touch bilateral.   Musculoskeletal: Bunion is present with pockets of fluid noted along the first metatarsal phalangeal joint.  See procedure note below but after aspiration noticed with tophi aspirated.  There is no crepitation noted.  Pain with MPJ range of motion.  Gait: Unassisted, Nonantalgic.       Assessment:   46 year old female with bunion, gout; bone lesions     Plan:  -Treatment options discussed including all alternatives, risks, and complications -Etiology of symptoms were discussed -X-rays were obtained and reviewed with the patient.  Bunion is noted with some erosion noted at the medial first metatarsal head.  There is sclerotic lesions noted in the distal tibia as well as the second third metatarsals. -Given the erosions of the bone will order foot and ankle MRI -Due to the fluid  I discussed with her aspiration of the area.  Discussed risks of this and she wishes to proceed.  The skin was prepped with alcohol and mixture of 3 cc of lidocaine, Marcaine plain was infiltrated in a regional block fashion.  Once anesthetized the skin was prepped with Betadine and an 18-gauge needle was used to aspirate tophi from the joint.  There was no purulence.  Small amount of steroid was then infiltrated with 0.5 cc of Kenalog 10, 0.5 cc of Marcaine plain.  She tolerated well without any complications. -Blood work ordered  Return in about 4 weeks (around 04/20/2021).  Trula Slade DPM

## 2021-04-08 ENCOUNTER — Ambulatory Visit: Payer: Self-pay | Admitting: Nurse Practitioner

## 2021-04-10 ENCOUNTER — Ambulatory Visit
Admission: RE | Admit: 2021-04-10 | Discharge: 2021-04-10 | Disposition: A | Discharge status: Home / Self Care | Payer: No Typology Code available for payment source | Source: Ambulatory Visit | Attending: Podiatry | Admitting: Podiatry

## 2021-04-10 ENCOUNTER — Other Ambulatory Visit: Payer: Self-pay

## 2021-04-10 DIAGNOSIS — M899 Disorder of bone, unspecified: Secondary | ICD-10-CM

## 2021-04-13 ENCOUNTER — Ambulatory Visit (INDEPENDENT_AMBULATORY_CARE_PROVIDER_SITE_OTHER): Payer: No Typology Code available for payment source | Admitting: Podiatry

## 2021-04-13 ENCOUNTER — Ambulatory Visit (INDEPENDENT_AMBULATORY_CARE_PROVIDER_SITE_OTHER): Payer: No Typology Code available for payment source

## 2021-04-13 ENCOUNTER — Other Ambulatory Visit: Payer: Self-pay

## 2021-04-13 DIAGNOSIS — M1000 Idiopathic gout, unspecified site: Secondary | ICD-10-CM

## 2021-04-13 DIAGNOSIS — M2012 Hallux valgus (acquired), left foot: Secondary | ICD-10-CM

## 2021-04-13 DIAGNOSIS — M899 Disorder of bone, unspecified: Secondary | ICD-10-CM

## 2021-04-13 NOTE — Progress Notes (Addendum)
Subjective: ?46 year old female presents the office today for follow-up evaluation of bunion, gout on her left foot.  She states overall she has improved.  She presents to discuss blood work as well as MRI results.  No new concerns otherwise.  No fevers or chills that she reports or any other concerns. ? 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  There is still tenderness palpation but appears to be somewhat improved clinically.  No other areas of pinpoint tenderness. ?No pain with calf compression, swelling, warmth, erythema ? ?Assessment: ?46 year old female with gout, bunion left first MPJ; significant arthritic, ankle deformity; bone lesions second third metatarsals ? ?Plan: ?-All treatment options discussed with the patient including all alternatives, risks, complications.  ?-Offered second steroid injection but we held off on this today.  Continue current pain medicine.  Continue allopurinol.  Also needs to work with her primary care physician.  We did discuss surgical intervention for likely first MPJ arthrodesis giving out gout present but I like for this to calm down before we proceed with surgery.  I am also going to send the foot MRI for second opinion for the bone lesions although this is likely from old injury.  She is at significant arthritic changes present at the ankle but is not causing significant pain at this time. ?-Updated blood work. ?-Patient encouraged to call the office with any questions, concerns, change in symptoms.  ? ?Trula Slade DPM ? ?

## 2021-04-14 LAB — BASIC METABOLIC PANEL
BUN/Creatinine Ratio: 17 (calc) (ref 6–22)
BUN: 32 mg/dL — ABNORMAL HIGH (ref 7–25)
CO2: 22 mmol/L (ref 20–32)
Calcium: 9.8 mg/dL (ref 8.6–10.2)
Chloride: 102 mmol/L (ref 98–110)
Creat: 1.86 mg/dL — ABNORMAL HIGH (ref 0.50–0.99)
Glucose, Bld: 145 mg/dL — ABNORMAL HIGH (ref 65–99)
Potassium: 4.5 mmol/L (ref 3.5–5.3)
Sodium: 136 mmol/L (ref 135–146)

## 2021-04-14 LAB — URIC ACID: Uric Acid, Serum: 9.1 mg/dL — ABNORMAL HIGH (ref 2.5–7.0)

## 2021-04-16 ENCOUNTER — Other Ambulatory Visit (HOSPITAL_COMMUNITY): Payer: Self-pay

## 2021-04-16 ENCOUNTER — Ambulatory Visit (INDEPENDENT_AMBULATORY_CARE_PROVIDER_SITE_OTHER): Payer: No Typology Code available for payment source | Admitting: Nurse Practitioner

## 2021-04-16 ENCOUNTER — Other Ambulatory Visit: Payer: Self-pay

## 2021-04-16 ENCOUNTER — Encounter: Payer: Self-pay | Admitting: Nurse Practitioner

## 2021-04-16 VITALS — BP 114/73 | HR 102 | Temp 98.0°F | Ht 65.0 in | Wt 199.0 lb

## 2021-04-16 DIAGNOSIS — D509 Iron deficiency anemia, unspecified: Secondary | ICD-10-CM

## 2021-04-16 DIAGNOSIS — I10 Essential (primary) hypertension: Secondary | ICD-10-CM

## 2021-04-16 DIAGNOSIS — R1012 Left upper quadrant pain: Secondary | ICD-10-CM

## 2021-04-16 DIAGNOSIS — E1169 Type 2 diabetes mellitus with other specified complication: Secondary | ICD-10-CM

## 2021-04-16 DIAGNOSIS — N898 Other specified noninflammatory disorders of vagina: Secondary | ICD-10-CM

## 2021-04-16 DIAGNOSIS — M109 Gout, unspecified: Secondary | ICD-10-CM

## 2021-04-16 LAB — POCT URINALYSIS DIP (CLINITEK)
Bilirubin, UA: NEGATIVE
Glucose, UA: NEGATIVE mg/dL
Ketones, POC UA: NEGATIVE mg/dL
Leukocytes, UA: NEGATIVE
Nitrite, UA: NEGATIVE
POC PROTEIN,UA: 100 — AB
Spec Grav, UA: 1.02 (ref 1.010–1.025)
Urobilinogen, UA: 0.2 E.U./dL
pH, UA: 5.5 (ref 5.0–8.0)

## 2021-04-16 LAB — POCT GLYCOSYLATED HEMOGLOBIN (HGB A1C)
HbA1c POC (<> result, manual entry): 6.8 % (ref 4.0–5.6)
HbA1c, POC (controlled diabetic range): 6.8 % (ref 0.0–7.0)
HbA1c, POC (prediabetic range): 6.8 % — AB (ref 5.7–6.4)
Hemoglobin A1C: 6.8 % — AB (ref 4.0–5.6)

## 2021-04-16 MED ORDER — METRONIDAZOLE 500 MG PO TABS
500.0000 mg | ORAL_TABLET | Freq: Three times a day (TID) | ORAL | 0 refills | Status: DC
  Filled 2021-04-16: qty 21, 7d supply, fill #0

## 2021-04-16 NOTE — Progress Notes (Signed)
[place] [place], [place]  [address] Phone:  [phone]   Fax:  [phone]    Established Patient Office Visit  Subjective:  Patient ID: Kaylee Maxwell, female    DOB: 1975-08-25  Age: 46 y.o. MRN: [id number]  CC:  Chief Complaint  Patient presents with   Follow-up    Pt is here for 3 month follow up. Pt stated she has vaginal irritation, burning, for the past 2 weeks.    HPI Kaylee Maxwell presents for follow up. She  has a past medical history of Anxiety (03/04/2018), Breast cancer screening (03/04/2018), Heart palpitations (03/04/2018), Hypertension, Irregular heartbeat (03/04/2018), Obesity, and Scalp mass (10/09/2012).   Kaylee Maxwell is in today for follow up for Hypertension. The current prescribed treatment is Zestoretic 20/25 mg  Compliance is reported and home blood pressure monitoring is not done. The  DASH diet is being followed. An exercise regimen is not ongoing. There is a goal to continue maintain. Denies headache, dizziness, visual changes, shortness of breath, dyspnea on exertion, chest pain, nausea, vomiting or any edema.   She is having abnormal vaginal irritation. Symptoms have been present for 2 weeks. Vaginal symptoms: local irritation. Contraception: none. She denies blisters, bumps, burning, discharge, dyspareunia, lesions, pain, post coital bleeding, tears, vulvar itching, and warts. Sexually transmitted infection risk: very low risk of STD exposure. Menstrual flow: regular every 28-30 days.  She has swelling in her left foot. This is related to Gout. She was seen at podiatry her uric acid was 12.5 and is now 9.1. She is now Allopurinol 100 mg. She has discontinued the [employer]. She is not sure what the trigger is. She has tried to make some treatment changes. She was encouraged to avoid all meat.   Past Medical History:  Diagnosis Date   Anxiety 03/04/2018   Breast cancer screening 03/04/2018   Heart palpitations 03/04/2018    Monitor 9/21: normal sinus rhythm, sinus tachycardia, Avg HR 110; occ PVCs   Hypertension    Irregular heartbeat 03/04/2018   Obesity    Scalp mass 10/09/2012    Past Surgical History:  Procedure Laterality Date   ANKLE FRACTURE SURGERY     cyst removal from scalp     left leg bone removal      Family History  Problem Relation Age of Onset   Diabetes Mother    Hypertension Mother    Obesity Mother    Hypertension Father    Cancer Paternal Aunt        pancreatic   Cancer Maternal Grandmother        lung   Autism spectrum disorder Sister     Social History   Socioeconomic History   Marital status: Single    Spouse name: Not on file   Number of children: 2   Years of education: Not on file   Highest education level: Associate degree: academic program  Occupational History   Not on file  Tobacco Use   Smoking status: Former    Packs/day: 0.50    Years: 18.00    Pack years: 9.00    Types: Cigarettes   Smokeless tobacco: Never  Vaping Use   Vaping Use: Every day   Substances: Nicotine, Flavoring  Substance and Sexual Activity   Alcohol use: Yes   Drug use: No   Sexual activity: Yes    Birth control/protection: None  Other Topics Concern   Not on file  Social History Narrative  Not on file   Social Determinants of Health   Financial Resource Strain: Not on file  Food Insecurity: Not on file  Transportation Needs: Not on file  Physical Activity: Not on file  Stress: Not on file  Social Connections: Not on file  Intimate Partner Violence: Not on file    Outpatient Medications Prior to Visit  Medication Sig Dispense Refill   allopurinol (ZYLOPRIM) 100 MG tablet Take 1 tablet (100 mg total) by mouth daily. 30 tablet 0   ferrous sulfate 325 (65 FE) MG EC tablet Take 325 mg by mouth daily with breakfast.     lisinopril-hydrochlorothiazide (ZESTORETIC) 20-25 MG tablet TAKE 1 TABLET BY MOUTH ONCE DAILY 90 tablet 3   Multiple Vitamin (MULTIVITAMIN) tablet Take 1  tablet by mouth daily.     traZODone (DESYREL) 50 MG tablet TAKE 1 TABLET BY MOUTH AT BEDTIME AS NEEDED FOR SLEEP 90 tablet 1   hydrocortisone 2.5 % cream APPLY TO THE AFFECTED AREA(S) 2 TIMES DAILY 30 g 5   metoprolol succinate (TOPROL-XL) 25 MG 24 hr tablet Take 1 tablet by mouth daily. Make appointment for further refills. (Patient not taking: Reported on 04/16/2021) 15 tablet 0   metroNIDAZOLE (FLAGYL) 500 MG tablet Take 1 tablet (500 mg total) by mouth 3 (three) times daily. (Patient not taking: Reported on 04/16/2021) 21 tablet 0   No facility-administered medications prior to visit.    No Known Allergies  ROS Review of Systems    Objective:    Physical Exam Constitutional:      Appearance: She is obese.  HENT:     Head: Normocephalic and atraumatic.     Nose: Nose normal.  Cardiovascular:     Rate and Rhythm: Normal rate and regular rhythm.     Pulses: Normal pulses.     Heart sounds: Normal heart sounds.  Pulmonary:     Effort: Pulmonary effort is normal.     Breath sounds: Normal breath sounds.  Musculoskeletal:        General: Normal range of motion.     Cervical back: Normal range of motion.  Skin:    General: Skin is warm and dry.     Capillary Refill: Capillary refill takes less than 2 seconds.  Neurological:     General: No focal deficit present.     Mental Status: She is alert and oriented to person, place, and time.  Psychiatric:        Mood and Affect: Mood normal.        Behavior: Behavior normal.        Thought Content: Thought content normal.        Judgment: Judgment normal.    BP 114/73    Pulse (!) 102    Temp 98 F (36.7 C)    Ht 5' 5"  (1.651 m)    Wt 199 lb (90.3 kg)    SpO2 100%    BMI 33.12 kg/m  Wt Readings from Last 3 Encounters:  04/16/21 199 lb (90.3 kg)  01/04/21 202 lb 2 oz (91.7 kg)  10/02/20 198 lb 0.4 oz (89.8 kg)     There are no preventive care reminders to display for this patient.   There are no preventive care reminders  to display for this patient.  Lab Results  Component Value Date   TSH 1.830 11/28/2019   Lab Results  Component Value Date   WBC 4.7 03/23/2021   HGB 10.7 (L) 03/23/2021   HCT 33.9 (L) 03/23/2021  MCV 87.1 03/23/2021   PLT 370 03/23/2021   Lab Results  Component Value Date   NA 136 04/13/2021   K 4.5 04/13/2021   CO2 22 04/13/2021   GLUCOSE 145 (H) 04/13/2021   BUN 32 (H) 04/13/2021   CREATININE 1.86 (H) 04/13/2021   BILITOT 0.3 01/04/2021   ALKPHOS 91 01/04/2021   AST 86 (H) 01/04/2021   ALT 151 (H) 10/02/2020   PROT 7.9 01/04/2021   ALBUMIN 4.4 01/04/2021   CALCIUM 9.8 04/13/2021   EGFR 57 (L) 01/04/2021   Lab Results  Component Value Date   CHOL 242 (H) 04/20/2020   Lab Results  Component Value Date   HDL 46 04/20/2020   Lab Results  Component Value Date   LDLCALC 167 (H) 04/20/2020   Lab Results  Component Value Date   TRIG 159 (H) 04/20/2020   Lab Results  Component Value Date   CHOLHDL 5.3 (H) 04/20/2020   Lab Results  Component Value Date   HGBA1C 6.8 (A) 04/16/2021   HGBA1C 6.8 04/16/2021   HGBA1C 6.8 (A) 04/16/2021   HGBA1C 6.8 04/16/2021      Assessment & Plan:   Problem List Items Addressed This Visit       Cardiovascular and Mediastinum   Essential hypertension Stable Encouraged on going compliance with current medication regimen Encouraged home monitoring and recording BP <130/80 Eating a heart-healthy diet with less salt Encouraged regular physical activity  Recommend Weight loss     Relevant Orders   Hepatic function panel     Endocrine   Diabetes mellitus ([place]) - Primary Controlled Encourage compliance with current treatment regimen   Encourage regular CBG monitoring Encourage contacting office if excessive hyperglycemia and or hypoglycemia Lifestyle modification with healthy diet (fewer calories, more high fiber foods, whole grains and non-starchy vegetables, lower fat meat and fish, low-fat diary include healthy  oils) regular exercise (physical activity) and weight loss Opthalmology exam discussed  Nutritional consult recommended Regular dental visits encouraged Home BP monitoring also encouraged goal <130/80    Relevant Orders   POCT glycosylated hemoglobin (Hb A1C) (Completed)   POCT URINALYSIS DIP (CLINITEK) (Completed)   Other Visit Diagnoses     Left upper quadrant abdominal pain     Evaluation pending   Relevant Orders   Amylase   Lipase   Iron deficiency anemia, unspecified iron deficiency anemia type       Relevant Orders   CBC with Differential/Platelet   Iron, TIBC and Ferritin Panel   Gout, unspecified cause, unspecified chronicity, unspecified site     Further discussion with trigger and things to avoid.  Education provided.   Vaginal irritation     Flagyl 500 mg TID x7 days   Relevant Orders   NuSwab Vaginitis Plus (VG+)       Meds ordered this encounter  Medications   metroNIDAZOLE (FLAGYL) 500 MG tablet    Sig: Take 1 tablet (500 mg total) by mouth 3 (three) times daily.    Dispense:  21 tablet    Refill:  0    Order Specific Question:   Supervising Provider    Answer:   Tresa Garter [id number]    Follow-up: Return for Follow up HTN [id number].    Vevelyn Francois, NP

## 2021-04-16 NOTE — Patient Instructions (Signed)
Gout ?Gout is painful swelling of your joints. Gout is a type of arthritis. It is caused by having too much uric acid in your body. Uric acid is a chemical that is made when your body breaks down substances called purines. If your body has too much uric acid, sharp crystals can form and build up in your joints. This causes pain and swelling. ?Gout attacks can happen quickly and be very painful (acute gout). Over time, the attacks can affect more joints and happen more often (chronic gout). ?What are the causes? ?Too much uric acid in your blood. This can happen because: ?Your kidneys do not remove enough uric acid from your blood. ?Your body makes too much uric acid. ?You eat too many foods that are high in purines. These foods include organ meats, some seafood, and beer. ?Trauma or stress. ?What increases the risk? ?Having a family history of gout. ?Being female and middle-aged. ?Being female and having gone through menopause. ?Being very overweight (obese). ?Drinking alcohol, especially beer. ?Not having enough water in the body (being dehydrated). ?Losing weight too quickly. ?Having an organ transplant. ?Having lead poisoning. ?Taking certain medicines. ?Having kidney disease. ?Having a skin condition called psoriasis. ?What are the signs or symptoms? ?An attack of acute gout usually happens in just one joint. The most common place is the big toe. Attacks often start at night. Other joints that may be affected include joints of the feet, ankle, knee, fingers, wrist, or elbow. Symptoms of an attack may include: ?Very bad pain. ?Warmth. ?Swelling. ?Stiffness. ?Shiny, red, or purple skin. ?Tenderness. The affected joint may be very painful to touch. ?Chills and fever. ?Chronic gout may cause symptoms more often. More joints may be involved. You may also have white or yellow lumps (tophi) on your hands or feet or in other areas near your joints. ?How is this treated? ?Treatment for this condition has two phases:  treating an acute attack and preventing future attacks. ?Acute gout treatment may include: ?NSAIDs. ?Steroids. These are taken by mouth or injected into a joint. ?Colchicine. This medicine relieves pain and swelling. It can be given by mouth or through an IV tube. ?Preventive treatment may include: ?Taking small doses of NSAIDs or colchicine daily. ?Using a medicine that reduces uric acid levels in your blood. ?Making changes to your diet. You may need to see a food expert (dietitian) about what to eat and drink to prevent gout. ?Follow these instructions at home: ?During a gout attack ? ?If told, put ice on the painful area: ?Put ice in a plastic bag. ?Place a towel between your skin and the bag. ?Leave the ice on for 20 minutes, 2-3 times a day. ?Raise (elevate) the painful joint above the level of your heart as often as you can. ?Rest the joint as much as possible. If the joint is in your leg, you may be given crutches. ?Follow instructions from your doctor about what you cannot eat or drink. ?Avoiding future gout attacks ?Eat a low-purine diet. Avoid foods and drinks such as: ?Liver. ?Kidney. ?Anchovies. ?Asparagus. ?Herring. ?Mushrooms. ?Mussels. ?Beer. ?Stay at a healthy weight. If you want to lose weight, talk with your doctor. Do not lose weight too fast. ?Start or continue an exercise plan as told by your doctor. ?Eating and drinking ?Drink enough fluids to keep your pee (urine) pale yellow. ?If you drink alcohol: ?Limit how much you use to: ?0-1 drink a day for women. ?0-2 drinks a day for men. ?Be aware of  how much alcohol is in your drink. In the [place] one drink equals one 12 oz bottle of beer (355 mL), one 5 oz glass of wine (148 mL), or one 1? oz glass of hard liquor (44 mL). ?General instructions ?Take over-the-counter and prescription medicines only as told by your doctor. ?Do not drive or use heavy machinery while taking prescription pain medicine. ?Return to your normal activities as told by your  doctor. Ask your doctor what activities are safe for you. ?Keep all follow-up visits as told by your doctor. This is important. ?Contact a doctor if: ?You have another gout attack. ?You still have symptoms of a gout attack after 10 days of treatment. ?You have problems (side effects) because of your medicines. ?You have chills or a fever. ?You have burning pain when you pee (urinate). ?You have pain in your lower back or belly. ?Get help right away if: ?You have very bad pain. ?Your pain cannot be controlled. ?You cannot pee. ?Summary ?Gout is painful swelling of the joints. ?The most common site of pain is the big toe, but it can affect other joints. ?Medicines and avoiding some foods can help to prevent and treat gout attacks. ?This information is not intended to replace advice given to you by your health care provider. Make sure you discuss any questions you have with your health care provider. ?Document Revised: 08/04/2017 Document Reviewed: 08/16/2017 ?Elsevier Patient Education ? [place]. ? ?

## 2021-04-17 LAB — AMYLASE: Amylase: 90 U/L (ref 31–110)

## 2021-04-17 LAB — CBC WITH DIFFERENTIAL/PLATELET
Basophils Absolute: 0 10*3/uL (ref 0.0–0.2)
Basos: 0 %
EOS (ABSOLUTE): 0.1 10*3/uL (ref 0.0–0.4)
Eos: 2 %
Hematocrit: 33 % — ABNORMAL LOW (ref 34.0–46.6)
Hemoglobin: 10.6 g/dL — ABNORMAL LOW (ref 11.1–15.9)
Immature Grans (Abs): 0 10*3/uL (ref 0.0–0.1)
Immature Granulocytes: 0 %
Lymphocytes Absolute: 1.6 10*3/uL (ref 0.7–3.1)
Lymphs: 30 %
MCH: 27.7 pg (ref 26.6–33.0)
MCHC: 32.1 g/dL (ref 31.5–35.7)
MCV: 86 fL (ref 79–97)
Monocytes Absolute: 0.8 10*3/uL (ref 0.1–0.9)
Monocytes: 16 %
Neutrophils Absolute: 2.7 10*3/uL (ref 1.4–7.0)
Neutrophils: 52 %
Platelets: 377 10*3/uL (ref 150–450)
RBC: 3.82 x10E6/uL (ref 3.77–5.28)
RDW: 14.5 % (ref 11.7–15.4)
WBC: 5.2 10*3/uL (ref 3.4–10.8)

## 2021-04-17 LAB — HEPATIC FUNCTION PANEL
ALT: 83 IU/L — ABNORMAL HIGH (ref 0–32)
AST: 111 IU/L — ABNORMAL HIGH (ref 0–40)
Albumin: 4.3 g/dL (ref 3.8–4.8)
Alkaline Phosphatase: 86 IU/L (ref 44–121)
Bilirubin Total: 0.2 mg/dL (ref 0.0–1.2)
Bilirubin, Direct: 0.12 mg/dL (ref 0.00–0.40)
Total Protein: 7.8 g/dL (ref 6.0–8.5)

## 2021-04-17 LAB — IRON,TIBC AND FERRITIN PANEL
Ferritin: 174 ng/mL — ABNORMAL HIGH (ref 15–150)
Iron Saturation: 12 % — ABNORMAL LOW (ref 15–55)
Iron: 47 ug/dL (ref 27–159)
Total Iron Binding Capacity: 391 ug/dL (ref 250–450)
UIBC: 344 ug/dL (ref 131–425)

## 2021-04-17 LAB — LIPASE: Lipase: 53 U/L (ref 14–72)

## 2021-04-20 LAB — NUSWAB VAGINITIS PLUS (VG+)
Candida albicans, NAA: NEGATIVE
Candida glabrata, NAA: NEGATIVE
Chlamydia trachomatis, NAA: NEGATIVE
Neisseria gonorrhoeae, NAA: NEGATIVE
Trich vag by NAA: NEGATIVE

## 2021-04-23 ENCOUNTER — Telehealth: Payer: Self-pay | Admitting: *Deleted

## 2021-04-23 NOTE — Telephone Encounter (Signed)
Patient is calling physician back, please call,possible lab results. ?

## 2021-05-11 ENCOUNTER — Telehealth: Payer: Self-pay | Admitting: *Deleted

## 2021-05-11 ENCOUNTER — Ambulatory Visit (INDEPENDENT_AMBULATORY_CARE_PROVIDER_SITE_OTHER): Payer: No Typology Code available for payment source | Admitting: Podiatry

## 2021-05-11 ENCOUNTER — Other Ambulatory Visit (HOSPITAL_COMMUNITY): Payer: Self-pay

## 2021-05-11 DIAGNOSIS — M1000 Idiopathic gout, unspecified site: Secondary | ICD-10-CM

## 2021-05-11 DIAGNOSIS — M2012 Hallux valgus (acquired), left foot: Secondary | ICD-10-CM | POA: Diagnosis not present

## 2021-05-11 DIAGNOSIS — Z01818 Encounter for other preprocedural examination: Secondary | ICD-10-CM

## 2021-05-11 MED ORDER — ALLOPURINOL 100 MG PO TABS
100.0000 mg | ORAL_TABLET | Freq: Every day | ORAL | 0 refills | Status: DC
  Filled 2021-05-11: qty 30, 30d supply, fill #0

## 2021-05-11 NOTE — Patient Instructions (Signed)
Pre-Operative Instructions ? ?Congratulations, you have decided to take an important step to improving your quality of life.  You can be assured that the doctors of [facility] will be with you every step of the way. ? ?Plan to be at the surgery center/hospital at least 1 (one) hour prior to your scheduled time unless otherwise directed by the surgical center/hospital staff.  You must have a responsible adult accompany you, remain during the surgery and drive you home.  Make sure you have directions to the surgical center/hospital and know how to get there on time. ?For hospital based surgery you will need to obtain a history and physical form from your family physician within 1 month prior to the date of surgery- we will give you a form for you primary physician.  ?We make every effort to accommodate the date you request for surgery.  There are however, times where surgery dates or times have to be moved.  We will contact you as soon as possible if a change in schedule is required.   ?No Aspirin/Ibuprofen for one week before surgery.  If you are on aspirin, any non-steroidal anti-inflammatory medications (Mobic, Aleve, Ibuprofen) you should stop taking it 7 days prior to your surgery.  You make take Tylenol  For pain prior to surgery.  ?Medications- If you are taking daily heart and blood pressure medications, seizure, reflux, allergy, asthma, anxiety, pain or diabetes medications, make sure the surgery center/hospital is aware before the day of surgery so they may notify you which medications to take or avoid the day of surgery. ?No food or drink after midnight the night before surgery unless directed otherwise by surgical center/hospital staff. ?No alcoholic beverages 24 hours prior to surgery.  No smoking 24 hours prior to or 24 hours after surgery. ?Wear loose pants or shorts- loose enough to fit over bandages, boots, and casts. ?No slip on shoes, sneakers are best. ?Bring your boot with you to the  surgery center/hospital.  Also bring crutches or a walker if your physician has prescribed it for you.  If you do not have this equipment, it will be provided for you after surgery. ?If you have not been contracted by the surgery center/hospital by the day before your surgery, call to confirm the date and time of your surgery. ?Leave-time from work may vary depending on the type of surgery you have.  Appropriate arrangements should be made prior to surgery with your employer. ?Prescriptions will be provided immediately following surgery by your doctor.  Have these filled as soon as possible after surgery and take the medication as directed. ?Remove nail polish on the operative foot. ?Wash the night before surgery.  The night before surgery wash the foot and leg well with the antibacterial soap provided and water paying special attention to beneath the toenails and in between the toes.  Rinse thoroughly with water and dry well with a towel.  Perform this wash unless told not to do so by your physician. ? ?Enclosed: 1 Ice pack (please put in freezer the night before surgery) ?  1 Hibiclens skin cleaner ?  Pre-op Instructions ? ?If you have any questions regarding the instructions, do not hesitate to call our office at any point during this process.  ? ?[facility]: [place], [place] [address] [phone] ? ?[facility]: [address] [place], [place] [address] [phone] ? ?Dr. Celesta Gentile, DPM ? ?

## 2021-05-11 NOTE — Telephone Encounter (Addendum)
Patient is calling to request that her lab order be sent to [facility]. She has a bill at [facility] and not able to take care of it today.Please advise. ?

## 2021-05-13 NOTE — Telephone Encounter (Signed)
Faxed order to [facility], received confirmation 05/13/21,patient notified.

## 2021-05-13 NOTE — Telephone Encounter (Signed)
Faxed the order to [facility], received confirmation 05/13/21. ?Called patient to notify that order had been faxed. ? ?

## 2021-05-14 DIAGNOSIS — M2012 Hallux valgus (acquired), left foot: Secondary | ICD-10-CM | POA: Insufficient documentation

## 2021-05-14 NOTE — Progress Notes (Signed)
Subjective: ?46 year old female presents the office today for follow-up evaluation of bunion, gout on her left foot.  She states that she has follow-up with her primary care physician regards to her kidney function.  She was told to be more hydrated.  She does not proceed with surgery as she is continue get pain on the first MPJ she points to pain on the bunion.  She has tried shoe modifications, offloading padding without significant resolution.  She is still on allopurinol as well for gout and she does feel that the gout is much improved.  No new injuries or concerns.  No other concerns.  ? 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  This is very improved as far as the tophus.  Majority of tenderness is localized along the bunion and there is mild discomfort with MPJ range of motion today.  There is still localized edema but no erythema or warmth.  No active signs of acute gout at this time.  No other areas of pinpoint tenderness. ?No pain with calf compression, swelling, warmth, erythema ? ?Assessment: ?46 year old female with gout, bunion left first MPJ; significant arthritic, ankle deformity; bone lesions second third metatarsals ? ?Plan: ?-All treatment options discussed with the patient including all alternatives, risks, complications.  ?-Still awaiting report on the second opinion on the MRI given the bone lesions of the metatarsals. ?-I discussed with both conservative as well as surgical options.  Given the amount of gout as well as the bunion I recommended first MPJ arthrodesis.  I discussed the surgery and she wants to proceed with this. ?-The incision placement as well as the postoperative course was discussed with the patient. I discussed risks of the surgery which include, but not limited to, infection, bleeding, pain, swelling, need for further surgery, delayed or  nonhealing, painful or ugly scar, numbness or sensation changes, over/under correction, recurrence, transfer lesions, further deformity, hardware failure, DVT/PE, loss of toe/foot. Patient understands these risks and wishes to proceed with surgery. The surgical consent was reviewed with the patient all 3 pages were signed. No promises or guarantees were given to the outcome of the procedure. All questions were answered to the best of my ability. Before the surgery the patient was encouraged to call the office if there is any further questions. The surgery will be performed at the [facility] on an outpatient basis. ?-Updated blood work ordered ? ?Trula Slade DPM ? ?

## 2021-05-17 ENCOUNTER — Encounter: Payer: Self-pay | Admitting: Podiatry

## 2021-05-20 ENCOUNTER — Telehealth: Payer: Self-pay

## 2021-05-20 ENCOUNTER — Other Ambulatory Visit: Payer: Self-pay | Admitting: Podiatry

## 2021-05-20 DIAGNOSIS — M2012 Hallux valgus (acquired), left foot: Secondary | ICD-10-CM

## 2021-05-20 NOTE — Telephone Encounter (Signed)
Kaylee Maxwell is having surgery 06/16/2021. She wants to know if she will need a walker or knee scooter to help her move around. Please advise ?

## 2021-05-21 ENCOUNTER — Telehealth: Payer: Self-pay | Admitting: Urology

## 2021-05-21 NOTE — Telephone Encounter (Signed)
DOS - 06/16/21 ? ?HALLUX MPJ FUSION LEFT --- 58850 ? ?[facility] EFFECTIVE DATE - 02/07/21 ? ?SPOKE WITH GINA WITH [facility] AND SHE STATED THAT CPT CODE 27741 HAS BEEN APPROVED, [place] # [id number], GOOD FROM 06/16/21 - 07/16/21. ? ?REF # GINA H. 05/21/21 AT 9:38AM  ?

## 2021-05-21 NOTE — Telephone Encounter (Signed)
I called [place] for delay and she said that she forgot that their doctor (Dr Tamala Julian) has to sign off on them before they can be faxed, he is out of town until Monday, will fax them after he signs. Sorry for the delay.

## 2021-05-21 NOTE — Telephone Encounter (Signed)
[place] over read services, will be faxing report asap.

## 2021-05-25 ENCOUNTER — Telehealth: Payer: Self-pay | Admitting: Cardiovascular Disease

## 2021-05-25 NOTE — Telephone Encounter (Signed)
? ?  Pre-operative Risk Assessment  ?  ?Patient Name: Kaylee Maxwell  ?DOB: Apr 15, 1975 ?MRN: [id number]  ? ?  ? ?Request for Surgical Clearance   ? ?Procedure:   Hallux Fusion Left Foot ? ?Date of Surgery:  Clearance 06/16/21                              ?   ?Surgeon:  Dr. Earleen Newport ?Surgeon's Group or Practice Name:  [facility] ?Phone number:  [phone] ?Fax number:  [phone] ?  ?Type of Clearance Requested:   ?- Medical  ?- Pharmacy:  Hold    Notify id any medications that need to be held ?  ?Type of Anesthesia:   Anesthesiologist Choice ?  ?Additional requests/questions:  Please advise surgeon/provider what medications should be held. ? ?Signed, ?Belisicia T Harris   ?05/25/2021, 3:22 PM  ? ?

## 2021-05-25 NOTE — Telephone Encounter (Signed)
? ?  Name: Kaylee Maxwell  ?DOB: 1975-09-17  ?MRN: [id number] ? ?Primary Cardiologist: Mertie Moores, MD ? ?Chart reviewed as part of pre-operative protocol coverage. Because of [name] past medical history and time since last visit, she will require a follow-up in-office visit in order to better assess preoperative cardiovascular risk. ? ?Last OV was >1 year ago (video visit 10/2019). ? ?Pre-op covering staff: ?- Please schedule appointment and call patient to inform them. Patient last saw Richardson Dopp in 2021. ?- Please contact requesting surgeon's office via preferred method (i.e, phone, fax) to inform them of need for appointment prior to surgery. ? ?No antiplatelets/anticoags listed on MAR to hold. ? ?[name], PA-C  ?05/25/2021, 3:56 PM  ? ?

## 2021-05-25 NOTE — Telephone Encounter (Signed)
Pt agreeable to plan of care for in office appt 06/01/21 8:50 with Melina Copa, PAC. I will forward notes to [facility] for appt. Will send FYI to requesting office pt has appt  ?

## 2021-05-26 ENCOUNTER — Telehealth: Payer: Self-pay | Admitting: Podiatry

## 2021-05-26 DIAGNOSIS — M79676 Pain in unspecified toe(s): Secondary | ICD-10-CM

## 2021-05-26 NOTE — Telephone Encounter (Signed)
How long is patient expected to be out of work? Also, Mrs. Ilagan would like to know if she will have a walker, scooter or what for mobility? ?

## 2021-05-26 NOTE — Telephone Encounter (Signed)
Sorry, I just read notes about knee scooter and I told the patient, I just need to know how long she will be oow? ?

## 2021-05-29 NOTE — Progress Notes (Addendum)
? ?Cardiology Office Note   ? ?Date:  06/01/2021  ? ?ID:  Kaylee Maxwell, DOB Jun 28, 1975, MRN [id number] ? ?PCP:  Teena Dunk, NP  ?Cardiologist:  Mertie Moores, MD  ?Electrophysiologist:  None  ? ?Chief Complaint: preoperative evaluation ? ?History of Present Illness:  ? ?Kaylee Maxwell is a 46 y.o. female with history of PVCs, sinus tachycardia, HTN, HLD (managed by primary care), prior abnormal LFTs, CKD stage IIIa with last Cr higher than prior, anxiety, former tobacco abuse who is seen for preoperative evaluation for hallux fusion of left foot.  ? ?She was originally evaluated for PVCs in 2020 and did not require any treatment or further evaluation at that time. At video follow-up in 10/2019 her HR was elevated and Zio showed NSR-ST with avg HR 110. She was started on metoprolol. Of note, she had abnormal lab parameters in 04/2021 as reviewed below. ? ?She is seen back for follow-up overall doing well from cardiac standpoint. She remains tachycardic around 110-113bpm. She had self discontinued her metoprolol, no specific reason, no adverse reaction to this medicine. She does not exercise but is able to exert herself doing ADLs without any chest pain or dyspnea. She is able to exert 6.7 METS by DASI without cardiac limitation. There are no symptoms with higher levels of activity, she just does not participate in them regularly. ? ?Labwork independently reviewed: ?04/2021 Hgb 10.6, AST/ALT elevated, Bun 32, Cr 1.86, K 4.5 ?03/2021 Hgb 10.7 ?12/2020 Cr 1.19 ?04/2020 LDL 167 ?2021 TSH wnl ? ?Cardiology Studies:  ? ?Studies reviewed are outlined and summarized above. Reports included below if pertinent.  ? ?Monitor 10/2019 ?Sinus rhythm, sinus tachycardia   77 to 160 bpm   Average HR 110 bpm   ?Occasional PVCs   ? ? ?Past Medical History:  ?Diagnosis Date  ? Anxiety 03/04/2018  ? Breast cancer screening 03/04/2018  ? Heart palpitations 03/04/2018  ? Monitor 9/21: normal sinus rhythm, sinus tachycardia, Avg HR 110;  occ PVCs  ? Hypertension   ? Irregular heartbeat 03/04/2018  ? Obesity   ? Scalp mass 10/09/2012  ? ? ?Past Surgical History:  ?Procedure Laterality Date  ? ANKLE FRACTURE SURGERY    ? cyst removal from scalp    ? left leg bone removal    ? ? ?Current Medications: ?Current Meds  ?Medication Sig  ? allopurinol (ZYLOPRIM) 100 MG tablet Take 1 tablet by mouth daily.  ? ferrous sulfate 325 (65 FE) MG EC tablet Take 325 mg by mouth daily with breakfast.  ? lisinopril-hydrochlorothiazide (ZESTORETIC) 20-25 MG tablet TAKE 1 TABLET BY MOUTH ONCE DAILY  ? Multiple Vitamin (MULTIVITAMIN) tablet Take 1 tablet by mouth daily.  ? traZODone (DESYREL) 50 MG tablet TAKE 1 TABLET BY MOUTH AT BEDTIME AS NEEDED FOR SLEEP  ? ?  ? ?Allergies:   Patient has no known allergies.  ? ?Social History  ? ?Socioeconomic History  ? Marital status: Single  ?  Spouse name: Not on file  ? Number of children: 2  ? Years of education: Not on file  ? Highest education level: Associate degree: academic program  ?Occupational History  ? Not on file  ?Tobacco Use  ? Smoking status: Former  ?  Packs/day: 0.50  ?  Years: 18.00  ?  Pack years: 9.00  ?  Types: Cigarettes  ? Smokeless tobacco: Never  ?Vaping Use  ? Vaping Use: Every day  ? Substances: Nicotine, Flavoring  ?Substance and Sexual Activity  ? Alcohol use: Yes  ?  Drug use: No  ? Sexual activity: Yes  ?  Birth control/protection: None  ?Other Topics Concern  ? Not on file  ?Social History Narrative  ? Not on file  ? ?Social Determinants of Health  ? ?Financial Resource Strain: Not on file  ?Food Insecurity: Not on file  ?Transportation Needs: Not on file  ?Physical Activity: Not on file  ?Stress: Not on file  ?Social Connections: Not on file  ?  ? ?Family History:  ?The patient's family history includes Autism spectrum disorder in her sister; Cancer in her maternal grandmother and paternal aunt; Diabetes in her mother; Hypertension in her father and mother; Obesity in her mother. ? ?ROS:   ?Please see  the history of present illness.  ?All other systems are reviewed and otherwise negative.  ? ? ?EKG(s)/Additional Labs  ? ?EKG:  EKG is ordered today, personally reviewed, demonstrating sinus tach 113bpm, occasional PVC, incomplete RBBB, nonspecific STT changes similar to prior ? ?Recent Labs: ?04/13/2021: BUN 32; Creat 1.86; Potassium 4.5; Sodium 136 ?04/16/2021: ALT 83; Hemoglobin 10.6; Platelets 377  ?Recent Lipid Panel ?   ?Component Value Date/Time  ? CHOL 242 (H) 04/20/2020 1012  ? TRIG 159 (H) 04/20/2020 1012  ? HDL 46 04/20/2020 1012  ? CHOLHDL 5.3 (H) 04/20/2020 1012  ? LDLCALC 167 (H) 04/20/2020 1012  ? ? ?PHYSICAL EXAM:   ? ?VS:  BP 116/80   Pulse (!) 113   Ht 5' 4"  (1.626 m)   Wt 197 lb 6.4 oz (89.5 kg)   SpO2 98%   BMI 33.88 kg/m?   BMI: Body mass index is 33.88 kg/m?. ? ?GEN: Well nourished, well developed female in no acute distress ?HEENT: normocephalic, atraumatic ?Neck: no JVD, carotid bruits, or masses ?Cardiac: tachycardic, regular, no murmurs, rubs, or gallops, no edema  ?Respiratory:  clear to auscultation bilaterally, normal work of breathing ?GI: soft, nontender, nondistended, + BS ?MS: no deformity or atrophy ?Skin: warm and dry, no rash ?Neuro:  Alert and Oriented x 3, Strength and sensation are intact, follows commands ?Psych: euthymic mood, full affect ? ?Wt Readings from Last 3 Encounters:  ?06/01/21 197 lb 6.4 oz (89.5 kg)  ?04/16/21 199 lb (90.3 kg)  ?01/04/21 202 lb 2 oz (91.7 kg)  ?  ? ?ASSESSMENT & PLAN:  ? ?1. Preoperative cardiac evaluation - revised cardiac risk index is 0.4% indicating low CV risk. She is able to complete over 6.7 METS without any angina or dyspnea. Given her baseline sinus tachycardia and h/o PVCs, I would suggest we obtain a baseline echocardiogram. If this is unrevealing I will plan to clear her for surgery but do recommend we get this prior to finalizing that recommendation. I'll fax a copy of this note to requesting surgeon. ADDENDUM 06/03/2021:  echocardiogram was reassuring with normal EF, only mild MR. Based on ACC/AHA guidelines, Kaylee Maxwell would be at acceptable risk for the planned procedure without further cardiovascular testing. However, her labwork did indicate hypercalcemia so I stopped her HCTZ and recommended she see primary care provider prior to surgery to ensure normalization of electrolytes. She also had continued renal dysfunction and abnormal liver function that will require PCP follow-up. From cardiac standpoint, though, no additional cardiac testing needed prior to surgery. Updated note re-faxed. ? ?2. H/o PVCs - prior monitor reviewed, one PVC noted on tracing today, has history of such. Obtain baseline echo as above and update labs. ? ?3. Sinus tachycardia - unclear etiology. Potentially inappropriate sinus tachycardia but I also question  whether this has been mediated by metabolic changes given her progressively more abnormal labs in 04/2021. I would suggest we update labs today and obtain 2D echocardiogram. Anticipate re-adding metoprolol, but await labs so that we can make comprehensive recommendations regarding ACEI/HCTZ at that time too. Creatinine was normal in 2021 but began to turn abnormal in 2022. This has gotten progressively worse by last check in 04/2021 but I do not see that meds were adjusted at that time. The patient does recall being encouraged to drink more water. ? ?4. Recent lab abnormalities including AKI superimposed on CKD stage IIIa, abnormal LFTs, anemia - as discussed above, will recheck today, anticipate recommending to f/u with PCP for these things as well. ? ?5. Essential HTN - given AKI by last labs have asked patient to hold lisinopril/HCTZ until we can review labs further. ? ?  ? ?Disposition: F/u with myself in 3 months. ? ? ?Medication Adjustments/Labs and Tests Ordered: ?Current medicines are reviewed at length with the patient today.  Concerns regarding medicines are outlined above. Medication  changes, Labs and Tests ordered today are summarized above and listed in the Patient Instructions accessible in Encounters.  ? ?Signed, ?[name], PA-C  ?06/01/2021 9:36 AM    ?[place]

## 2021-06-01 ENCOUNTER — Encounter: Payer: Self-pay | Admitting: Physician Assistant

## 2021-06-01 ENCOUNTER — Ambulatory Visit: Payer: No Typology Code available for payment source | Admitting: Physician Assistant

## 2021-06-01 VITALS — BP 116/80 | HR 113 | Ht 64.0 in | Wt 197.4 lb

## 2021-06-01 DIAGNOSIS — I10 Essential (primary) hypertension: Secondary | ICD-10-CM

## 2021-06-01 DIAGNOSIS — Z01810 Encounter for preprocedural cardiovascular examination: Secondary | ICD-10-CM | POA: Diagnosis not present

## 2021-06-01 DIAGNOSIS — I493 Ventricular premature depolarization: Secondary | ICD-10-CM | POA: Diagnosis not present

## 2021-06-01 DIAGNOSIS — R000 Tachycardia, unspecified: Secondary | ICD-10-CM

## 2021-06-01 DIAGNOSIS — N189 Chronic kidney disease, unspecified: Secondary | ICD-10-CM

## 2021-06-01 DIAGNOSIS — R7989 Other specified abnormal findings of blood chemistry: Secondary | ICD-10-CM

## 2021-06-01 DIAGNOSIS — N179 Acute kidney failure, unspecified: Secondary | ICD-10-CM | POA: Diagnosis not present

## 2021-06-01 DIAGNOSIS — D649 Anemia, unspecified: Secondary | ICD-10-CM

## 2021-06-01 NOTE — Patient Instructions (Addendum)
Medication Instructions:  ?HOLD Lisinopril Until further notice ?*If you need a refill on your cardiac medications before your next appointment, please call your pharmacy* ? ? ?Lab Work: ?TODAY-CMET, MAG, CBC, TSH/ reflex ?If you have labs (blood work) drawn today and your tests are completely normal, you will receive your results only by: ?MyChart Message (if you have MyChart) OR ?A paper copy in the mail ?If you have any lab test that is abnormal or we need to change your treatment, we will call you to review the results. ? ? ?Testing/Procedures: ?Your physician has requested that you have an echocardiogram. Echocardiography is a painless test that uses sound waves to create images of your heart. It provides your doctor with information about the size and shape of your heart and how well your heart?s chambers and valves are working. This procedure takes approximately one hour. There are no restrictions for this procedure. ?PLEASE SCHEDULE URGENTLY ? ?Follow-Up: ?At [facility], you and your health needs are our priority.  As part of our continuing mission to provide you with exceptional heart care, we have created designated Provider Care Teams.  These Care Teams include your primary Cardiologist (physician) and Advanced Practice Providers (APPs -  Physician Assistants and Nurse Practitioners) who all work together to provide you with the care you need, when you need it. ? ?We recommend signing up for the patient portal called "MyChart".  Sign up information is provided on this After Visit Summary.  MyChart is used to connect with patients for Virtual Visits (Telemedicine).  Patients are able to view lab/test results, encounter notes, upcoming appointments, etc.  Non-urgent messages can be sent to your provider as well.   ?To learn more about what you can do with MyChart, go to [email or web address].   ? ?Your next appointment:   ?3 month(s) ? ?The format for your next appointment:   ?In Person ? ?Provider:    ?Melina Copa, PA   ? ? ?Other Instructions ? ? ?Important Information About Sugar ? ? ? ? ?  ?

## 2021-06-02 LAB — COMPREHENSIVE METABOLIC PANEL
ALT: 61 IU/L — ABNORMAL HIGH (ref 0–32)
AST: 76 IU/L — ABNORMAL HIGH (ref 0–40)
Albumin/Globulin Ratio: 1.2 (ref 1.2–2.2)
Albumin: 4.5 g/dL (ref 3.8–4.8)
Alkaline Phosphatase: 83 IU/L (ref 44–121)
BUN/Creatinine Ratio: 24 — ABNORMAL HIGH (ref 9–23)
BUN: 28 mg/dL — ABNORMAL HIGH (ref 6–24)
Bilirubin Total: 0.4 mg/dL (ref 0.0–1.2)
CO2: 18 mmol/L — ABNORMAL LOW (ref 20–29)
Calcium: 10.6 mg/dL — ABNORMAL HIGH (ref 8.7–10.2)
Chloride: 99 mmol/L (ref 96–106)
Creatinine, Ser: 1.19 mg/dL — ABNORMAL HIGH (ref 0.57–1.00)
Globulin, Total: 3.7 g/dL (ref 1.5–4.5)
Glucose: 148 mg/dL — ABNORMAL HIGH (ref 70–99)
Potassium: 4.7 mmol/L (ref 3.5–5.2)
Sodium: 135 mmol/L (ref 134–144)
Total Protein: 8.2 g/dL (ref 6.0–8.5)
eGFR: 57 mL/min/{1.73_m2} — ABNORMAL LOW (ref >59)

## 2021-06-02 LAB — CBC
Hematocrit: 33.6 % — ABNORMAL LOW (ref 34.0–46.6)
Hemoglobin: 11.1 g/dL (ref 11.1–15.9)
MCH: 28.1 pg (ref 26.6–33.0)
MCHC: 33 g/dL (ref 31.5–35.7)
MCV: 85 fL (ref 79–97)
Platelets: 357 10*3/uL (ref 150–450)
RBC: 3.95 x10E6/uL (ref 3.77–5.28)
RDW: 13.8 % (ref 11.7–15.4)
WBC: 4.2 10*3/uL (ref 3.4–10.8)

## 2021-06-02 LAB — MAGNESIUM: Magnesium: 1.8 mg/dL (ref 1.6–2.3)

## 2021-06-02 LAB — TSH RFX ON ABNORMAL TO FREE T4: TSH: 1.38 u[IU]/mL (ref 0.450–4.500)

## 2021-06-03 ENCOUNTER — Other Ambulatory Visit: Payer: Self-pay

## 2021-06-03 ENCOUNTER — Ambulatory Visit (HOSPITAL_COMMUNITY): Payer: No Typology Code available for payment source | Attending: Cardiology

## 2021-06-03 ENCOUNTER — Other Ambulatory Visit: Payer: Self-pay | Admitting: Podiatry

## 2021-06-03 ENCOUNTER — Other Ambulatory Visit (HOSPITAL_COMMUNITY): Payer: Self-pay

## 2021-06-03 DIAGNOSIS — I493 Ventricular premature depolarization: Secondary | ICD-10-CM | POA: Diagnosis present

## 2021-06-03 DIAGNOSIS — N189 Chronic kidney disease, unspecified: Secondary | ICD-10-CM | POA: Insufficient documentation

## 2021-06-03 DIAGNOSIS — I10 Essential (primary) hypertension: Secondary | ICD-10-CM | POA: Diagnosis not present

## 2021-06-03 DIAGNOSIS — R7989 Other specified abnormal findings of blood chemistry: Secondary | ICD-10-CM | POA: Diagnosis present

## 2021-06-03 DIAGNOSIS — D649 Anemia, unspecified: Secondary | ICD-10-CM | POA: Diagnosis present

## 2021-06-03 DIAGNOSIS — Z01810 Encounter for preprocedural cardiovascular examination: Secondary | ICD-10-CM | POA: Diagnosis present

## 2021-06-03 DIAGNOSIS — N179 Acute kidney failure, unspecified: Secondary | ICD-10-CM | POA: Diagnosis present

## 2021-06-03 DIAGNOSIS — R000 Tachycardia, unspecified: Secondary | ICD-10-CM | POA: Diagnosis not present

## 2021-06-03 LAB — ECHOCARDIOGRAM COMPLETE
Area-P 1/2: 4.83 cm2
S' Lateral: 2.8 cm

## 2021-06-03 LAB — VITAMIN D 25 HYDROXY (VIT D DEFICIENCY, FRACTURES): Vit D, 25-Hydroxy: 42 ng/mL (ref 30–100)

## 2021-06-03 MED ORDER — PERFLUTREN LIPID MICROSPHERE
1.0000 mL | INTRAVENOUS | Status: AC | PRN
  Administered 2021-06-03: 2 mL via INTRAVENOUS

## 2021-06-03 MED ORDER — METOPROLOL SUCCINATE ER 50 MG PO TB24
50.0000 mg | ORAL_TABLET | Freq: Every day | ORAL | 1 refills | Status: DC
  Filled 2021-06-03: qty 90, 90d supply, fill #0
  Filled 2021-09-06: qty 90, 90d supply, fill #1

## 2021-06-03 MED ORDER — LISINOPRIL 20 MG PO TABS
20.0000 mg | ORAL_TABLET | Freq: Every day | ORAL | 1 refills | Status: DC
  Filled 2021-06-03: qty 90, 90d supply, fill #0
  Filled 2021-09-17: qty 90, 90d supply, fill #1

## 2021-06-04 LAB — BASIC METABOLIC PANEL WITH GFR
BUN/Creatinine Ratio: 26 (calc) — ABNORMAL HIGH (ref 6–22)
BUN: 31 mg/dL — ABNORMAL HIGH (ref 7–25)
CO2: 22 mmol/L (ref 20–32)
Calcium: 10 mg/dL (ref 8.6–10.2)
Chloride: 102 mmol/L (ref 98–110)
Creat: 1.18 mg/dL — ABNORMAL HIGH (ref 0.50–0.99)
Glucose, Bld: 150 mg/dL — ABNORMAL HIGH (ref 65–99)
Potassium: 4.5 mmol/L (ref 3.5–5.3)
Sodium: 136 mmol/L (ref 135–146)
eGFR: 58 mL/min/{1.73_m2} — ABNORMAL LOW (ref >=60)

## 2021-06-04 LAB — CBC WITH DIFFERENTIAL/PLATELET
Absolute Monocytes: 662 cells/uL (ref 200–950)
Basophils Absolute: 9 cells/uL (ref 0–200)
Basophils Relative: 0.2 %
Eosinophils Absolute: 112 cells/uL (ref 15–500)
Eosinophils Relative: 2.6 %
HCT: 34.2 % — ABNORMAL LOW (ref 35.0–45.0)
Hemoglobin: 11 g/dL — ABNORMAL LOW (ref 11.7–15.5)
Lymphs Abs: 976 cells/uL (ref 850–3900)
MCH: 28.4 pg (ref 27.0–33.0)
MCHC: 32.2 g/dL (ref 32.0–36.0)
MCV: 88.4 fL (ref 80.0–100.0)
MPV: 10.2 fL (ref 7.5–12.5)
Monocytes Relative: 15.4 %
Neutro Abs: 2541 cells/uL (ref 1500–7800)
Neutrophils Relative %: 59.1 %
Platelets: 351 10*3/uL (ref 140–400)
RBC: 3.87 10*6/uL (ref 3.80–5.10)
RDW: 13.9 % (ref 11.0–15.0)
Total Lymphocyte: 22.7 %
WBC: 4.3 10*3/uL (ref 3.8–10.8)

## 2021-06-08 ENCOUNTER — Encounter: Payer: Self-pay | Admitting: Podiatry

## 2021-06-16 ENCOUNTER — Other Ambulatory Visit: Payer: Self-pay | Admitting: Podiatry

## 2021-06-16 ENCOUNTER — Encounter: Payer: Self-pay | Admitting: Podiatry

## 2021-06-16 ENCOUNTER — Other Ambulatory Visit (HOSPITAL_COMMUNITY): Payer: Self-pay

## 2021-06-16 DIAGNOSIS — M2012 Hallux valgus (acquired), left foot: Secondary | ICD-10-CM

## 2021-06-16 MED ORDER — OXYCODONE-ACETAMINOPHEN 5-325 MG PO TABS
1.0000 | ORAL_TABLET | Freq: Four times a day (QID) | ORAL | 0 refills | Status: DC | PRN
  Filled 2021-06-16: qty 25, 3d supply, fill #0

## 2021-06-16 MED ORDER — CEPHALEXIN 500 MG PO CAPS
500.0000 mg | ORAL_CAPSULE | Freq: Three times a day (TID) | ORAL | 0 refills | Status: DC
  Filled 2021-06-16: qty 21, 7d supply, fill #0

## 2021-06-16 MED ORDER — PROMETHAZINE HCL 25 MG PO TABS
25.0000 mg | ORAL_TABLET | Freq: Three times a day (TID) | ORAL | 0 refills | Status: DC | PRN
Start: 2021-06-16 — End: 2022-05-10
  Filled 2021-06-16: qty 20, 7d supply, fill #0

## 2021-06-21 ENCOUNTER — Ambulatory Visit (INDEPENDENT_AMBULATORY_CARE_PROVIDER_SITE_OTHER): Payer: No Typology Code available for payment source | Admitting: Podiatry

## 2021-06-21 ENCOUNTER — Ambulatory Visit (INDEPENDENT_AMBULATORY_CARE_PROVIDER_SITE_OTHER): Payer: No Typology Code available for payment source

## 2021-06-21 ENCOUNTER — Other Ambulatory Visit (HOSPITAL_COMMUNITY): Payer: Self-pay

## 2021-06-21 DIAGNOSIS — Z9889 Other specified postprocedural states: Secondary | ICD-10-CM

## 2021-06-21 DIAGNOSIS — M21612 Bunion of left foot: Secondary | ICD-10-CM

## 2021-06-21 MED ORDER — IBUPROFEN 800 MG PO TABS
800.0000 mg | ORAL_TABLET | Freq: Three times a day (TID) | ORAL | 0 refills | Status: DC | PRN
  Filled 2021-06-21: qty 30, 10d supply, fill #0

## 2021-06-22 NOTE — Progress Notes (Signed)
Subjective: ?Kaylee Maxwell is a 46 y.o. is seen today in office s/p first MPJ arthrodesis left side preformed on 06/16/2021.  She states her pain is better controlled she is only taking ibuprofen as needed.  She has been in the cam boot nonweightbearing is much as possible.  No recent injuries.  Denies any systemic complaints such as fevers, chills, nausea, vomiting. No calf pain, chest pain, shortness of breath.  ? ?Objective: ?General: No acute distress, AAOx3  ?DP/PT pulses palpable 2/4, CRT < 3 sec to all digits.  ?Protective sensation intact. Motor function intact.  ?Left foot: Incision is well coapted without any evidence of dehiscence. There is no surrounding erythema, ascending cellulitis, fluctuance, crepitus, malodor, drainage/purulence. There is mild to moderate edema around the surgical site. There is no significant pain along the surgical site.  Arthrodesis site appears to be stable.  Toe is rectus. ?No other areas of tenderness to bilateral lower extremities.  ?No other open lesions or pre-ulcerative lesions.  ?No pain with calf compression, swelling, warmth, erythema.  ? ?Assessment and Plan:  ?Status post left first MPJ arthrodesis, doing well with no complications  ? ?-Treatment options discussed including all alternatives, risks, and complications ?-X-rays obtained reviewed.  3 views of the left foot were obtained.  No subacute fracture.  Hardware intact any complicating factors. ?-Incision was cleaned.  Antibiotic ointment and a bandage applied.  Keep the dressing clean, dry, intact. ?-Nonweightbearing, cam boot at all times ?-Ice/elevation ?-Pain medication as needed. ?-Monitor for any clinical signs or symptoms of infection and DVT/PE and directed to call the office immediately should any occur or go to the ER. ?-Follow-up as scheduled for possible suture removal or sooner if any problems arise. In the meantime, encouraged to call the office with any questions, concerns, change in symptoms.   ? ?Celesta Gentile, DPM ? ?

## 2021-06-24 ENCOUNTER — Other Ambulatory Visit (HOSPITAL_COMMUNITY): Payer: Self-pay

## 2021-06-25 ENCOUNTER — Encounter: Payer: Self-pay | Admitting: Podiatry

## 2021-06-25 ENCOUNTER — Other Ambulatory Visit: Payer: Self-pay | Admitting: Podiatry

## 2021-06-25 ENCOUNTER — Other Ambulatory Visit (HOSPITAL_COMMUNITY): Payer: Self-pay

## 2021-06-25 ENCOUNTER — Telehealth: Payer: Self-pay | Admitting: *Deleted

## 2021-06-25 MED ORDER — FLUCONAZOLE 150 MG PO TABS
150.0000 mg | ORAL_TABLET | Freq: Once | ORAL | 0 refills | Status: AC
  Filled 2021-06-25: qty 1, 1d supply, fill #0

## 2021-06-25 NOTE — Telephone Encounter (Signed)
Patient is requesting something for a yeast infection immediately, taking antibiotics, Please advise.

## 2021-06-29 NOTE — Telephone Encounter (Signed)
Patient received prescription

## 2021-06-29 NOTE — Telephone Encounter (Signed)
[facility] has faxed overread report, in folder to view and to be scanned into epic.

## 2021-07-01 ENCOUNTER — Ambulatory Visit (INDEPENDENT_AMBULATORY_CARE_PROVIDER_SITE_OTHER): Payer: No Typology Code available for payment source | Admitting: Podiatry

## 2021-07-01 DIAGNOSIS — Z9889 Other specified postprocedural states: Secondary | ICD-10-CM

## 2021-07-01 DIAGNOSIS — M21612 Bunion of left foot: Secondary | ICD-10-CM

## 2021-07-03 NOTE — Progress Notes (Signed)
Subjective: Kaylee Maxwell is a 46 y.o. is seen today in office s/p first MPJ arthrodesis left side preformed on 06/16/2021.  States that she is doing better.  She is asking for knee scooter as she is returned to work in order to help set the foot more.  She has been icing elevating.  No recent injury or change otherwise.  No fevers or chills.   Objective: General: No acute distress, AAOx3  DP/PT pulses palpable 2/4, CRT < 3 sec to all digits.  Protective sensation intact. Motor function intact.  Left foot: Incision is well coapted without any evidence of dehiscence.  There is mild to moderate edema.  No erythema or warmth.  There is no clinical signs of infection.  The toes in rectus position.  Arthrodesis site appears to be stable. No other open lesions or pre-ulcerative lesions.  No pain with calf compression, swelling, warmth, erythema.   Assessment and Plan:  Status post left first MPJ arthrodesis, doing well with no complications   -Treatment options discussed including all alternatives, risks, and complications -At this time discussed she can start to wash the foot with soap and water, dry thoroughly and apply bandage which I also applied today.  Encouraged to continue nonweightbearing I did reorder her knee scooter.  We will follow-up on this again.  Continue to ice and elevate as well as cam boot. -Monitor for any clinical signs or symptoms of infection and DVT/PE and directed to call the office immediately should any occur or go to the ER. -Follow-up as scheduled for possible suture removal or sooner if any problems arise. In the meantime, encouraged to call the office with any questions, concerns, change in symptoms.   Celesta Gentile, DPM

## 2021-07-15 ENCOUNTER — Ambulatory Visit (INDEPENDENT_AMBULATORY_CARE_PROVIDER_SITE_OTHER): Payer: No Typology Code available for payment source | Admitting: Podiatry

## 2021-07-15 ENCOUNTER — Ambulatory Visit (INDEPENDENT_AMBULATORY_CARE_PROVIDER_SITE_OTHER): Payer: No Typology Code available for payment source

## 2021-07-15 ENCOUNTER — Other Ambulatory Visit (HOSPITAL_COMMUNITY): Payer: Self-pay

## 2021-07-15 DIAGNOSIS — Z9889 Other specified postprocedural states: Secondary | ICD-10-CM

## 2021-07-15 DIAGNOSIS — M21612 Bunion of left foot: Secondary | ICD-10-CM

## 2021-07-15 MED ORDER — COLCHICINE 0.6 MG PO TABS
0.6000 mg | ORAL_TABLET | Freq: Every day | ORAL | 0 refills | Status: DC
  Filled 2021-07-15: qty 8, 8d supply, fill #0

## 2021-07-15 MED ORDER — COLCHICINE 0.6 MG PO TABS: 0.6000 mg | ORAL_TABLET | Freq: Every day | ORAL | 0 refills | Status: DC

## 2021-07-18 NOTE — Progress Notes (Signed)
Subjective: Kaylee Maxwell is a 46 y.o. is seen today in office s/p first MPJ arthrodesis left side preformed on 06/16/2021.  States that she has been doing well.  She is not having any significant pain.  Still with some swelling but this is improving.  She has been in the cam boot and she has put some weight on her foot even without the boot she reports.  No injuries.  No fevers or chills.   Objective: General: No acute distress, AAOx3  DP/PT pulses palpable 2/4, CRT < 3 sec to all digits.  Protective sensation intact. Motor function intact.  Left foot: Incision is well coapted without any evidence of dehiscence.  There is still some mild edema present but there is no erythema or warmth.  There is no tenderness palpation.  Arthrodesis site appears to be stable.  No obvious signs of infection. No other open lesions or pre-ulcerative lesions.  No pain with calf compression, swelling, warmth, erythema.   Assessment and Plan:  Status post left first MPJ arthrodesis, doing well with no complications   -Treatment options discussed including all alternatives, risks, and complications -X-rays were obtained and reviewed of the left foot.  3 views were obtained.  Hardware intact with any complicating factors. -At this point discussed with her she needs to remain in the cam boot times but discussed partial weightbearing.  Continue ice, elevate. -Monitor for any clinical signs or symptoms of infection and directed to call the office immediately should any occur or go to the ER.  Return in about 2 weeks (around 07/29/2021) for post-op, x-rays.  Trula Slade DPM

## 2021-07-29 ENCOUNTER — Encounter: Payer: Self-pay | Admitting: Podiatry

## 2021-07-29 ENCOUNTER — Ambulatory Visit (INDEPENDENT_AMBULATORY_CARE_PROVIDER_SITE_OTHER): Payer: No Typology Code available for payment source | Admitting: Podiatry

## 2021-07-29 ENCOUNTER — Ambulatory Visit (INDEPENDENT_AMBULATORY_CARE_PROVIDER_SITE_OTHER): Payer: No Typology Code available for payment source

## 2021-07-29 DIAGNOSIS — Z9889 Other specified postprocedural states: Secondary | ICD-10-CM

## 2021-07-30 ENCOUNTER — Other Ambulatory Visit: Payer: No Typology Code available for payment source

## 2021-08-13 ENCOUNTER — Encounter: Payer: Self-pay | Admitting: Physician Assistant

## 2021-08-13 NOTE — Progress Notes (Deleted)
Cardiology Office Note    Date:  08/13/2021   ID:  Kaylee Maxwell, DOB [birth year], MRN [id number]  PCP:  Teena Dunk, NP  Cardiologist:  Mertie Moores, MD  Electrophysiologist:  None   Chief Complaint: ***  History of Present Illness:   Kaylee Maxwell is a 46 y.o. female with history of PVCs, sinus tachycardia, HTN, HLD (managed by primary care), mild MR, abnormal LFTs, CKD stage IIIa, anxiety, former tobacco abuse who is seen for follow-up.   She was originally evaluated for PVCs in 2020 and did not require any treatment or further evaluation at that time. At video follow-up in 10/2019 her HR was elevated and Zio showed NSR-ST with avg HR 110. She was started on metoprolol. She was seen for pre-operative evaluation 05/2021 for foot surgery as well as routine follow-up. She had self-discontinued her metoprolol and was still tachycardic. We restarted metoprolol and arranged an echo 06/03/21 showing EF 55-60%, G1DD, mild MR. She was cleared for surgery. I had recommended she f/u with her PCP to discuss her anemia, hypercalcemia, and abnormal LFTs.  Sinus tachycardia History of PVCs Essential HTN Mild mitral regurgitation  Labwork independently reviewed: 05/2021 Hgb 11.0, plt 351, K 4.5, Cr 1.18, Mg 1.8, calcium repeated at 10 (prev 10.6), AST 76/ALT 61, TSH wnl   Cardiology Studies:   Studies reviewed are outlined and summarized above. Reports included below if pertinent.   Echo 05/2021   1. Left ventricular ejection fraction, by estimation, is 55 to 60%. The  left ventricle has normal function. The left ventricle has no regional  wall motion abnormalities. Left ventricular diastolic parameters are  consistent with Grade I diastolic  dysfunction (impaired relaxation).   2. Right ventricular systolic function is normal. The right ventricular  size is normal. Tricuspid regurgitation signal is inadequate for assessing  PA pressure.   3. The mitral valve is normal in structure.  Mild mitral valve  regurgitation. No evidence of mitral stenosis.   4. The aortic valve is tricuspid. Aortic valve regurgitation is not  visualized. No aortic stenosis is present.   5. The inferior vena cava is normal in size with greater than 50%  respiratory variability, suggesting right atrial pressure of 3 mmHg.   Monitor 10/2019 Sinus rhythm, sinus tachycardia   77 to 160 bpm   Average HR 110 bpm   Occasional PVCs     Past Medical History:  Diagnosis Date   Anxiety 03/04/2018   Breast cancer screening 03/04/2018   Heart palpitations 03/04/2018   Monitor 9/21: normal sinus rhythm, sinus tachycardia, Avg HR 110; occ PVCs   Hypertension    Irregular heartbeat 03/04/2018   Obesity    Scalp mass 10/09/2012    Past Surgical History:  Procedure Laterality Date   ANKLE FRACTURE SURGERY     cyst removal from scalp     left leg bone removal      Current Medications: No outpatient medications have been marked as taking for the 08/16/21 encounter (Appointment) with [name], PA-C.   ***   Allergies:   Patient has no known allergies.   Social History   Socioeconomic History   Marital status: Single    Spouse name: Not on file   Number of children: 2   Years of education: Not on file   Highest education level: Associate degree: academic program  Occupational History   Not on file  Tobacco Use   Smoking status: Former    Packs/day: 0.50  Years: 18.00    Total pack years: 9.00    Types: Cigarettes   Smokeless tobacco: Never  Vaping Use   Vaping Use: Every day   Substances: Nicotine, Flavoring  Substance and Sexual Activity   Alcohol use: Yes   Drug use: No   Sexual activity: Yes    Birth control/protection: None  Other Topics Concern   Not on file  Social History Narrative   Not on file   Social Determinants of Health   Financial Resource Strain: High Risk (03/21/2018)   Overall Financial Resource Strain (CARDIA)    Difficulty of Paying Living Expenses:  Hard  Food Insecurity: No Food Insecurity (03/21/2018)   Hunger Vital Sign    Worried About Running Out of Food in the Last Year: Never true    Ran Out of Food in the Last Year: Never true  Transportation Needs: No Transportation Needs (03/21/2018)   PRAPARE - Hydrologist (Medical): No    Lack of Transportation (Non-Medical): No  Physical Activity: Inactive (03/21/2018)   Exercise Vital Sign    Days of Exercise per Week: 0 days    Minutes of Exercise per Session: 0 min  Stress: Stress Concern Present (03/21/2018)   [place]    Feeling of Stress : Rather much  Social Connections: Moderately Integrated (03/21/2018)   Social Connection and Isolation Panel [NHANES]    Frequency of Communication with Friends and Family: Three times a week    Frequency of Social Gatherings with Friends and Family: Twice a week    Attends Religious Services: More than 4 times per year    Active Member of [employer] or Organizations: Yes    Attends Archivist Meetings: Never    Marital Status: Never married     Family History:  The patient's ***family history includes Autism spectrum disorder in her sister; Cancer in her maternal grandmother and paternal aunt; Diabetes in her mother; Hypertension in her father and mother; Obesity in her mother.  ROS:   Please see the history of present illness. Otherwise, review of systems is positive for ***.  All other systems are reviewed and otherwise negative.    EKG(s)/Additional Labs   EKG:  EKG is ordered today, personally reviewed, demonstrating ***  Recent Labs: 06/01/2021: ALT 61; Magnesium 1.8; TSH 1.380 06/03/2021: BUN 31; Creat 1.18; Hemoglobin 11.0; Platelets 351; Potassium 4.5; Sodium 136  Recent Lipid Panel    Component Value Date/Time   CHOL 242 (H) 04/20/2020 1012   TRIG 159 (H) 04/20/2020 1012   HDL 46 04/20/2020 1012   CHOLHDL 5.3 (H) 04/20/2020  1012   LDLCALC 167 (H) 04/20/2020 1012    PHYSICAL EXAM:    VS:  There were no vitals taken for this visit.  BMI: There is no height or weight on file to calculate BMI.  GEN: Well nourished, well developed female in no acute distress HEENT: normocephalic, atraumatic Neck: no JVD, carotid bruits, or masses Cardiac: ***RRR; no murmurs, rubs, or gallops, no edema  Respiratory:  clear to auscultation bilaterally, normal work of breathing GI: soft, nontender, nondistended, + BS MS: no deformity or atrophy Skin: warm and dry, no rash Neuro:  Alert and Oriented x 3, Strength and sensation are intact, follows commands Psych: euthymic mood, full affect  Wt Readings from Last 3 Encounters:  06/01/21 197 lb 6.4 oz (89.5 kg)  04/16/21 199 lb (90.3 kg)  01/04/21 202 lb 2 oz (  91.7 kg)     ASSESSMENT & PLAN:   ***     Disposition: F/u with ***   Medication Adjustments/Labs and Tests Ordered: Current medicines are reviewed at length with the patient today.  Concerns regarding medicines are outlined above. Medication changes, Labs and Tests ordered today are summarized above and listed in the Patient Instructions accessible in Encounters.   Signed, [name], PA-C  08/13/2021 10:36 AM    [place] Phone: [phone]; Fax: [phone]

## 2021-08-16 ENCOUNTER — Ambulatory Visit: Payer: No Typology Code available for payment source | Admitting: Physician Assistant

## 2021-08-16 ENCOUNTER — Other Ambulatory Visit: Payer: Self-pay | Admitting: Podiatry

## 2021-08-17 NOTE — Telephone Encounter (Signed)
Called patient and she is still in need of those medications.

## 2021-08-17 NOTE — Telephone Encounter (Signed)
Can someone call her to see if she still needs both of these medications? Thanks.

## 2021-08-26 ENCOUNTER — Ambulatory Visit (INDEPENDENT_AMBULATORY_CARE_PROVIDER_SITE_OTHER): Payer: No Typology Code available for payment source

## 2021-08-26 ENCOUNTER — Ambulatory Visit (INDEPENDENT_AMBULATORY_CARE_PROVIDER_SITE_OTHER): Payer: No Typology Code available for payment source | Admitting: Podiatry

## 2021-08-26 ENCOUNTER — Other Ambulatory Visit (HOSPITAL_COMMUNITY): Payer: Self-pay

## 2021-08-26 DIAGNOSIS — Z9889 Other specified postprocedural states: Secondary | ICD-10-CM

## 2021-08-26 DIAGNOSIS — M1000 Idiopathic gout, unspecified site: Secondary | ICD-10-CM

## 2021-08-26 DIAGNOSIS — M21612 Bunion of left foot: Secondary | ICD-10-CM | POA: Diagnosis not present

## 2021-08-26 MED ORDER — ALLOPURINOL 100 MG PO TABS
100.0000 mg | ORAL_TABLET | Freq: Every day | ORAL | 2 refills | Status: DC
Start: 2021-08-26 — End: 2021-11-22
  Filled 2021-08-26: qty 30, 30d supply, fill #0
  Filled 2021-09-23: qty 30, 30d supply, fill #1
  Filled 2021-10-22: qty 30, 30d supply, fill #2

## 2021-08-26 NOTE — Progress Notes (Signed)
Subjective: Chief Complaint  Patient presents with   Routine Post Op    POV #5 DOS 06/16/21  --- LEFT FOOT FUSION OF 1ST METATARSAL PHALANGEAL JOINT (BIG TIE JOINT) WITH PLATES/ SCREWS, PT IS DOING WELL, X-RAYS TAKEN TODAY     Kaylee Maxwell is a 46 y.o. is seen today in office s/p first MPJ arthrodesis left side preformed on 06/16/2021.  She states that she has been doing well in the left foot.  She did have a gout flare in the right foot that is doing much better.  She is currently not taking allopurinol or other medications for gout.  She still in the surgical shoe on the left foot. No fevers or chills.   Objective: General: No acute distress, AAOx3  DP/PT pulses palpable 2/4, CRT < 3 sec to all digits.  Protective sensation intact. Motor function intact.  Incision left side well coapted.  Arthrodesis stable.  There is minimal edema there is no erythema or warmth.  No other areas of discomfort in the left foot. On the right foot there is some mild diffuse tenderness on the dorsal midfoot with no specific area pinpoint tenderness.  She states her symptoms have much improved.  Flexor, extensor tendons appear to be intact.  MMT 5/5. No pain with calf compression, swelling, warmth, erythema.   Assessment and Plan:  Status post left first MPJ arthrodesis, doing well with no complications; right foot gout  -Treatment options discussed including all alternatives, risks, and complications -X-rays obtained reviewed of the left foot.  3 views were obtained.  Hardware intact any complicating factors.  Increased consolidation across the arthrodesis site.  Chronic deformities of the second third metatarsals. -Discussed transition to regular shoe as tolerated gradually.  Continue ice, elevate as well as compression likely postoperative edema. -Gout symptoms improving.  Will not restart allopurinol.  We will check a uric acid, BMP in about 4 weeks.  Order given for this today.  Return in about 4 weeks  (around 09/23/2021).  Trula Slade DPM

## 2021-08-30 ENCOUNTER — Other Ambulatory Visit (HOSPITAL_COMMUNITY): Payer: Self-pay

## 2021-09-01 ENCOUNTER — Other Ambulatory Visit (HOSPITAL_COMMUNITY): Payer: Self-pay

## 2021-09-02 ENCOUNTER — Other Ambulatory Visit (HOSPITAL_COMMUNITY): Payer: Self-pay

## 2021-09-06 ENCOUNTER — Other Ambulatory Visit (HOSPITAL_COMMUNITY): Payer: Self-pay

## 2021-09-17 ENCOUNTER — Other Ambulatory Visit (HOSPITAL_COMMUNITY): Payer: Self-pay

## 2021-09-23 ENCOUNTER — Other Ambulatory Visit (HOSPITAL_COMMUNITY): Payer: Self-pay

## 2021-09-27 ENCOUNTER — Ambulatory Visit: Payer: No Typology Code available for payment source | Admitting: Podiatry

## 2021-10-22 ENCOUNTER — Other Ambulatory Visit (HOSPITAL_COMMUNITY): Payer: Self-pay

## 2021-11-08 ENCOUNTER — Ambulatory Visit (INDEPENDENT_AMBULATORY_CARE_PROVIDER_SITE_OTHER): Payer: No Typology Code available for payment source

## 2021-11-08 ENCOUNTER — Ambulatory Visit (INDEPENDENT_AMBULATORY_CARE_PROVIDER_SITE_OTHER): Payer: No Typology Code available for payment source | Admitting: Podiatry

## 2021-11-08 DIAGNOSIS — M21612 Bunion of left foot: Secondary | ICD-10-CM

## 2021-11-08 DIAGNOSIS — Z9889 Other specified postprocedural states: Secondary | ICD-10-CM

## 2021-11-14 NOTE — Progress Notes (Signed)
Subjective: Chief Complaint  Patient presents with   Bunions    Patient came in today for a follow-up of left foot Bunion surgery, Patient is doing well denies any apin      Kaylee Maxwell is a 46 y.o. is seen today in office s/p first MPJ arthrodesis left side preformed on 06/16/2021.  She states that she is doing well and not having any pain.  He has been doing well, no recent flareups.  No fevers or chills.  Objective: General: No acute distress, AAOx3  DP/PT pulses palpable 2/4, CRT < 3 sec to all digits.  Protective sensation intact. Motor function intact.  Incision left side well coapted.  Arthrodesis stable.  Persistent minimal edema but there is no erythema or warmth associated with this. No pain with calf compression, swelling, warmth, erythema.   Assessment and Plan:  Status post left first MPJ arthrodesis, doing well with no complications  -Treatment options discussed including all alternatives, risks, and complications -X-rays obtained reviewed of the left foot.  3 views were obtained.  Hardware intact any complicating factors.  Increased consolidation across the arthrodesis site.  Chronic deformities of the second third metatarsals. -Discussed continue with regular shoe and gradually increase as tolerated.  She was still wearing the surgical shoe today.  Continue ice, elevate as well as compression of the residual edema.  Return in about 6 weeks (around 12/20/2021).  Trula Slade DPM

## 2021-11-22 ENCOUNTER — Other Ambulatory Visit: Payer: Self-pay | Admitting: Podiatry

## 2021-11-22 MED ORDER — ALLOPURINOL 100 MG PO TABS
100.0000 mg | ORAL_TABLET | Freq: Every day | ORAL | 2 refills | Status: DC
  Filled 2021-11-22: qty 30, 30d supply, fill #0
  Filled 2021-12-23: qty 30, 30d supply, fill #1
  Filled 2022-01-20: qty 30, 30d supply, fill #2

## 2021-11-23 ENCOUNTER — Other Ambulatory Visit (HOSPITAL_COMMUNITY): Payer: Self-pay

## 2021-11-24 ENCOUNTER — Other Ambulatory Visit (HOSPITAL_COMMUNITY): Payer: Self-pay

## 2021-12-09 ENCOUNTER — Other Ambulatory Visit: Payer: Self-pay | Admitting: Physician Assistant

## 2021-12-10 ENCOUNTER — Other Ambulatory Visit (HOSPITAL_COMMUNITY): Payer: Self-pay

## 2021-12-10 MED ORDER — METOPROLOL SUCCINATE ER 50 MG PO TB24
50.0000 mg | ORAL_TABLET | Freq: Every day | ORAL | 1 refills | Status: DC
  Filled 2021-12-10: qty 90, 90d supply, fill #0
  Filled 2022-03-14: qty 90, 90d supply, fill #1

## 2021-12-10 MED ORDER — LISINOPRIL 20 MG PO TABS
20.0000 mg | ORAL_TABLET | Freq: Every day | ORAL | 1 refills | Status: DC
  Filled 2021-12-10: qty 90, 90d supply, fill #0
  Filled 2022-03-14: qty 90, 90d supply, fill #1

## 2021-12-11 ENCOUNTER — Other Ambulatory Visit (HOSPITAL_COMMUNITY): Payer: Self-pay

## 2021-12-21 ENCOUNTER — Ambulatory Visit: Payer: No Typology Code available for payment source | Admitting: Podiatry

## 2021-12-23 ENCOUNTER — Other Ambulatory Visit (HOSPITAL_COMMUNITY): Payer: Self-pay

## 2022-01-20 ENCOUNTER — Other Ambulatory Visit (HOSPITAL_COMMUNITY): Payer: Self-pay

## 2022-02-21 ENCOUNTER — Other Ambulatory Visit: Payer: Self-pay | Admitting: Podiatry

## 2022-02-22 MED ORDER — ALLOPURINOL 100 MG PO TABS
100.0000 mg | ORAL_TABLET | Freq: Every day | ORAL | 2 refills | Status: DC
  Filled 2022-02-22: qty 30, 30d supply, fill #0
  Filled 2022-03-24: qty 30, 30d supply, fill #1
  Filled 2022-04-26: qty 30, 30d supply, fill #2

## 2022-02-23 ENCOUNTER — Other Ambulatory Visit: Payer: Self-pay

## 2022-02-23 ENCOUNTER — Other Ambulatory Visit (HOSPITAL_COMMUNITY): Payer: Self-pay

## 2022-03-14 ENCOUNTER — Other Ambulatory Visit (HOSPITAL_COMMUNITY): Payer: Self-pay

## 2022-03-24 ENCOUNTER — Other Ambulatory Visit (HOSPITAL_COMMUNITY): Payer: Self-pay

## 2022-03-28 ENCOUNTER — Other Ambulatory Visit: Payer: Self-pay | Admitting: Nurse Practitioner

## 2022-03-29 ENCOUNTER — Other Ambulatory Visit (HOSPITAL_COMMUNITY): Payer: Self-pay

## 2022-04-11 ENCOUNTER — Other Ambulatory Visit: Payer: Self-pay | Admitting: Nurse Practitioner

## 2022-04-12 ENCOUNTER — Other Ambulatory Visit: Payer: Self-pay

## 2022-04-12 ENCOUNTER — Other Ambulatory Visit (HOSPITAL_COMMUNITY): Payer: Self-pay

## 2022-04-12 ENCOUNTER — Other Ambulatory Visit: Payer: Self-pay | Admitting: Nurse Practitioner

## 2022-04-12 MED ORDER — TRAZODONE HCL 50 MG PO TABS
50.0000 mg | ORAL_TABLET | Freq: Every evening | ORAL | 0 refills | Status: DC | PRN
  Filled 2022-04-12: qty 30, 30d supply, fill #0

## 2022-04-13 ENCOUNTER — Other Ambulatory Visit: Payer: Self-pay | Admitting: Nurse Practitioner

## 2022-04-13 DIAGNOSIS — Z1231 Encounter for screening mammogram for malignant neoplasm of breast: Secondary | ICD-10-CM

## 2022-04-13 NOTE — Telephone Encounter (Signed)
Pt was called and made an appointment. [place]

## 2022-04-13 NOTE — Telephone Encounter (Signed)
Pt was scheduled.  [place]

## 2022-04-19 ENCOUNTER — Ambulatory Visit (INDEPENDENT_AMBULATORY_CARE_PROVIDER_SITE_OTHER): Payer: 59 | Admitting: Nurse Practitioner

## 2022-04-19 ENCOUNTER — Other Ambulatory Visit (HOSPITAL_COMMUNITY): Payer: Self-pay

## 2022-04-19 ENCOUNTER — Other Ambulatory Visit: Payer: Self-pay

## 2022-04-19 ENCOUNTER — Encounter: Payer: Self-pay | Admitting: Nurse Practitioner

## 2022-04-19 VITALS — BP 156/80 | HR 89 | Temp 97.9°F | Ht 65.0 in | Wt 208.6 lb

## 2022-04-19 DIAGNOSIS — I10 Essential (primary) hypertension: Secondary | ICD-10-CM

## 2022-04-19 DIAGNOSIS — Z0000 Encounter for general adult medical examination without abnormal findings: Secondary | ICD-10-CM | POA: Insufficient documentation

## 2022-04-19 DIAGNOSIS — Z1321 Encounter for screening for nutritional disorder: Secondary | ICD-10-CM

## 2022-04-19 DIAGNOSIS — D509 Iron deficiency anemia, unspecified: Secondary | ICD-10-CM | POA: Diagnosis not present

## 2022-04-19 DIAGNOSIS — Z1329 Encounter for screening for other suspected endocrine disorder: Secondary | ICD-10-CM | POA: Diagnosis not present

## 2022-04-19 DIAGNOSIS — E1165 Type 2 diabetes mellitus with hyperglycemia: Secondary | ICD-10-CM | POA: Diagnosis not present

## 2022-04-19 DIAGNOSIS — E119 Type 2 diabetes mellitus without complications: Secondary | ICD-10-CM

## 2022-04-19 DIAGNOSIS — F5101 Primary insomnia: Secondary | ICD-10-CM

## 2022-04-19 DIAGNOSIS — Z13228 Encounter for screening for other metabolic disorders: Secondary | ICD-10-CM | POA: Diagnosis not present

## 2022-04-19 DIAGNOSIS — Z130 Encounter for screening for diseases of the blood and blood-forming organs and certain disorders involving the immune mechanism: Secondary | ICD-10-CM

## 2022-04-19 LAB — POCT GLYCOSYLATED HEMOGLOBIN (HGB A1C): Hemoglobin A1C: 6.6 % — AB (ref 4.0–5.6)

## 2022-04-19 MED ORDER — SEMAGLUTIDE(0.25 OR 0.5MG/DOS) 2 MG/3ML ~~LOC~~ SOPN
0.2500 mg | PEN_INJECTOR | SUBCUTANEOUS | 0 refills | Status: DC
  Filled 2022-04-19: qty 3, 30d supply, fill #0

## 2022-04-19 MED ORDER — FREESTYLE LITE W/DEVICE KIT
PACK | 0 refills | Status: DC
  Filled 2022-04-19: qty 1, 30d supply, fill #0

## 2022-04-19 MED ORDER — GNP TRUE METRIX GLUCOSE STRIPS VI STRP
ORAL_STRIP | 12 refills | Status: AC
  Filled 2022-04-19: qty 100, 90d supply, fill #0

## 2022-04-19 MED ORDER — FREESTYLE LANCETS MISC
12 refills | Status: AC
  Filled 2022-04-19: qty 100, 90d supply, fill #0

## 2022-04-19 NOTE — Assessment & Plan Note (Addendum)
Lab Results  Component Value Date   HGBA1C 6.6 (A) 04/19/2022  Was intolerant to metformin in the past Start Ozempic 0.25 mg once weekly injection Side effects of Ozempic discussed Denies family history of medullary thyroid cancer, no personal history of pancreatitis Follow-up in 4 weeks Diabetes foot exam completed today Check urine microalbumin labs, fasting lipid panel Samples of Ozempic 0.25 mg once weekly injection given

## 2022-04-19 NOTE — Progress Notes (Signed)
Complete physical exam  Patient: Kaylee Maxwell   DOB: 1975/10/31   47 y.o. Female  MRN: [id number]  Subjective:    Chief Complaint  Patient presents with   Follow-up    Medication ch.eck    Kaylee Maxwell is a 47 y.o. female  has a past medical history of Anxiety (03/04/2018), Breast cancer screening (03/04/2018), Heart palpitations (03/04/2018), Hypertension, Irregular heartbeat (03/04/2018), Mild mitral regurgitation, Obesity, PVC's (premature ventricular contractions), Scalp mass (10/09/2012), Sinus tachycardia, and Type 2 diabetes mellitus ([place]).  who presents today for a complete physical exam. She reports consuming a general diet. The patient does not participate in regular exercise at present. She generally feels well.   Patient was last seen in this office about a year ago by Dionisio David NP.   Insomnia.  Patient reports snoring does not feel well rested when she wakes up.  Takes trazodone 50 mg at nighttime.  Sleep hygiene discussed with the patient states that will try sleep hygiene first and if not helpful we can go up on her trazodone.  She refused referral for sleep study.  She is up-to-date with flu vaccine, not sure if she has had a Tdap vaccine in the past 10 years she was encouraged to check her vaccine record and get the vaccine if due.   Type 2 diabetes.  Stated that she was on metformin in the past she stopped taking the medication due to GI upset.  No complaints of polyphagia, polyuria, polydipsia.  States that she has had an eye exam within the past 12 months records we requested today       Most recent fall risk assessment:    04/19/2022    8:46 AM  [place] in the past year? 0     Most recent depression screenings:    04/19/2022    8:46 AM 04/16/2021    1:59 PM  PHQ 2/9 Scores  PHQ - 2 Score 0 0  PHQ- 9 Score 4 2        Patient Care Team: Renee Rival, FNP as PCP - General (Nurse Practitioner) Nahser, Wonda Cheng, MD as PCP -  Cardiology (Cardiology)   Outpatient Medications Prior to Visit  Medication Sig   allopurinol (ZYLOPRIM) 100 MG tablet Take 1 tablet (100 mg total) by mouth daily.   ferrous sulfate 325 (65 FE) MG EC tablet Take 325 mg by mouth daily with breakfast.   lisinopril (ZESTRIL) 20 MG tablet Take 1 tablet (20 mg total) by mouth daily.   metoprolol succinate (TOPROL-XL) 50 MG 24 hr tablet Take 1 tablet (50 mg total) by mouth daily. Take with or immediately following a meal.   Multiple Vitamin (MULTIVITAMIN) tablet Take 1 tablet by mouth daily.   traZODone (DESYREL) 50 MG tablet Take 1 tablet (50 mg total) by mouth at bedtime as needed for sleep   colchicine 0.6 MG tablet Take 1 tablet by mouth daily.   ibuprofen (ADVIL) 800 MG tablet Take 1 tablet (800 mg total) by mouth every 8 (eight) hours as needed. (Patient not taking: Reported on 04/19/2022)   promethazine (PHENERGAN) 25 MG tablet Take 1 tablet (25 mg total) by mouth every 8 (eight) hours as needed for nausea or vomiting.   [DISCONTINUED] oxyCODONE-acetaminophen (PERCOCET/ROXICET) 5-325 MG tablet Take 1-2 tablets by mouth every 6 (six) hours as needed for severe pain.   No facility-administered medications prior to visit.    Review of Systems  Constitutional:  Positive for malaise/fatigue.  HENT: Negative.    Eyes:  Negative for pain, discharge and redness.  Respiratory: Negative.    Cardiovascular: Negative.   Gastrointestinal: Negative.   Genitourinary: Negative.   Musculoskeletal: Negative.   Skin: Negative.   Neurological: Negative.   Endo/Heme/Allergies: Negative.   Psychiatric/Behavioral: Negative.            Objective:     BP (!) 156/80   Pulse 89   Temp 97.9 F (36.6 C)   Ht '5\' 5"'$  (1.651 m)   Wt 208 lb 9.6 oz (94.6 kg)   SpO2 100%   BMI 34.71 kg/m    Physical Exam Exam conducted with a chaperone present.  Constitutional:      General: She is not in acute distress.    Appearance: She is obese. She is not  ill-appearing, toxic-appearing or diaphoretic.  HENT:     Right Ear: Tympanic membrane, ear canal and external ear normal. There is no impacted cerumen.     Left Ear: Tympanic membrane, ear canal and external ear normal. There is no impacted cerumen.     Nose: Nose normal. No congestion or rhinorrhea.     Mouth/Throat:     Mouth: Mucous membranes are moist.     Pharynx: Oropharynx is clear. No oropharyngeal exudate or posterior oropharyngeal erythema.  Eyes:     General: No scleral icterus.       Right eye: No discharge.        Left eye: No discharge.     Extraocular Movements: Extraocular movements intact.     Conjunctiva/sclera: Conjunctivae normal.  Neck:     Vascular: No carotid bruit.  Cardiovascular:     Rate and Rhythm: Regular rhythm.     Pulses: Normal pulses.     Heart sounds: Normal heart sounds. No murmur heard.    No friction rub. No gallop.  Pulmonary:     Effort: Pulmonary effort is normal. No respiratory distress.     Breath sounds: Normal breath sounds. No stridor. No wheezing, rhonchi or rales.  Chest:     Chest wall: No mass, lacerations, deformity, swelling, tenderness, crepitus or edema. There is no dullness to percussion.  Breasts:    Tanner Score is 5.     Right: Normal. No swelling, bleeding, inverted nipple, mass, nipple discharge, skin change or tenderness.     Left: Normal. No swelling, bleeding, inverted nipple, mass, nipple discharge, skin change or tenderness.  Abdominal:     General: There is no distension.     Palpations: Abdomen is soft. There is no mass.     Tenderness: There is no abdominal tenderness. There is no right CVA tenderness, left CVA tenderness, guarding or rebound.     Hernia: No hernia is present.  Musculoskeletal:        General: No swelling, tenderness, deformity or signs of injury.     Cervical back: Normal range of motion and neck supple. No rigidity or tenderness.     Right lower leg: No edema.     Left lower leg: No edema.   Lymphadenopathy:     Cervical: No cervical adenopathy.     Upper Body:     Right upper body: No supraclavicular, axillary or pectoral adenopathy.     Left upper body: No supraclavicular, axillary or pectoral adenopathy.  Skin:    General: Skin is warm and dry.     Capillary Refill: Capillary refill takes less than 2 seconds.     Coloration: Skin is not jaundiced  or pale.     Findings: No bruising, erythema, lesion or rash.  Neurological:     Mental Status: She is alert and oriented to person, place, and time.     Sensory: No sensory deficit.     Motor: No weakness.     Coordination: Coordination normal.     Gait: Gait normal.  Psychiatric:        Mood and Affect: Mood normal.        Behavior: Behavior normal.        Thought Content: Thought content normal.        Judgment: Judgment normal.      Results for orders placed or performed in visit on 04/19/22  POCT glycosylated hemoglobin (Hb A1C)  Result Value Ref Range   Hemoglobin A1C 6.6 (A) 4.0 - 5.6 %   HbA1c POC (<> result, manual entry)     HbA1c, POC (prediabetic range)     HbA1c, POC (controlled diabetic range)         Assessment & Plan:    Routine Health Maintenance and Physical Exam  Immunization History  Administered Date(s) Administered   Influenza-Unspecified 11/08/2019   PFIZER(Purple Top)SARS-COV-2 Vaccination 09/28/2019, 10/20/2019    Health Maintenance  Topic Date Due   DTaP/Tdap/Td (1 - Tdap) Never done   Diabetic kidney evaluation - Urine ACR  09/04/2020   OPHTHALMOLOGY EXAM  10/08/2020   INFLUENZA VACCINE  09/07/2021   COVID-19 Vaccine (3 - 2023-24 season) 10/08/2021   Diabetic kidney evaluation - eGFR measurement  06/04/2022   HEMOGLOBIN A1C  10/20/2022   PAP SMEAR-Modifier  01/20/2023   FOOT EXAM  04/19/2023   Fecal DNA (Cologuard)  02/17/2024   Hepatitis C Screening  Completed   HIV Screening  Completed   HPV VACCINES  Aged Out    Discussed health benefits of physical activity, and  encouraged her to engage in regular exercise appropriate for her age and condition.  Problem List Items Addressed This Visit       Cardiovascular and Mediastinum   Essential hypertension    BP Readings from Last 3 Encounters:  04/19/22 (!) 156/80  06/01/21 116/80  04/16/21 114/73  Blood pressure is elevated in the office today Currently on metoprolol 50 mg daily, lisinopril 20 mg daily Reported that she has been taking both medication daily as prescribed but does not check her blood pressure at home, she denies chest pain, dizziness, edema Patient does not want to make changes made to her medications today if blood pressure remains elevated at next visit will make changes to her medication Blood pressure goal is less than 130/80 Follow-up in 4 weeks        Endocrine   Type 2 diabetes mellitus without complication, without long-term current use of insulin (HCC)    Lab Results  Component Value Date   HGBA1C 6.6 (A) 04/19/2022  Was intolerant to metformin in the past Start Ozempic 0.25 mg once weekly injection Side effects of Ozempic discussed Denies family history of medullary thyroid cancer, no personal history of pancreatitis Follow-up in 4 weeks Diabetes foot exam completed today Check urine microalbumin labs, fasting lipid panel Samples of Ozempic 0.25 mg once weekly injection given      Relevant Medications   Semaglutide,0.25 or 0.'5MG'$ /DOS, 2 MG/3ML SOPN     Other   Primary insomnia    Continue trazodone 50 mg daily at bedtime Sleep hygiene discussed      Class 2 obesity in adult    Wt Readings  from Last 3 Encounters:  04/19/22 208 lb 9.6 oz (94.6 kg)  06/01/21 197 lb 6.4 oz (89.5 kg)  04/16/21 199 lb (90.3 kg)   Body mass index is 34.71 kg/m.  Patient counseled on low-carb modified diet She was encouraged to engage in regular moderate to vigorous exercise with at least 150 minutes weekly Benefits of healthy weight discussed Starting Ozempic for diabetes       Relevant Medications   Semaglutide,0.25 or 0.'5MG'$ /DOS, 2 MG/3ML SOPN   Annual physical exam - Primary    Annual exam as documented.  Counseling done include healthy lifestyle involving committing to 150 minutes of exercise per week, heart healthy diet, and attaining healthy weight. The importance of adequate sleep also discussed.  Regular use of seat belt and home safety were also discussed . Changes in health habits are decided on by patient with goals and time frames set for achieving them. Immunization and cancer screening  needs are specifically addressed at this visit.    Up-to-date with cervical Pap exam, mammogram scheduled  She was encouraged to get her Tdap vaccine against not up-to-date Routine labs ordered      Iron deficiency anemia    She reports intermittent fatigue Will check iron panel Currently on ferrous sulfate 325 mg once daily      Relevant Orders   CBC with Differential   Iron, TIBC and Ferritin Panel   Other Visit Diagnoses     Screening for endocrine, nutritional, metabolic and immunity disorder       Relevant Orders   CBC with Differential   Lipid Panel   CMP14+EGFR   Vitamin D, 25-hydroxy   TSH   Controlled type 2 diabetes mellitus with hyperglycemia, without long-term current use of insulin (HCC)       Relevant Medications   Semaglutide,0.25 or 0.'5MG'$ /DOS, 2 MG/3ML SOPN   Accu-Chek Softclix Lancets lancets   Other Relevant Orders   Microalbumin / creatinine urine ratio   POCT glycosylated hemoglobin (Hb A1C) (Completed)      Return in about 4 weeks (around 05/17/2022) for T2DM.     Renee Rival, FNP

## 2022-04-19 NOTE — Assessment & Plan Note (Signed)
Continue trazodone 50 mg daily at bedtime Sleep hygiene discussed

## 2022-04-19 NOTE — Assessment & Plan Note (Signed)
She reports intermittent fatigue Will check iron panel Currently on ferrous sulfate 325 mg once daily

## 2022-04-19 NOTE — Assessment & Plan Note (Addendum)
Annual exam as documented.  Counseling done include healthy lifestyle involving committing to 150 minutes of exercise per week, heart healthy diet, and attaining healthy weight. The importance of adequate sleep also discussed.  Regular use of seat belt and home safety were also discussed . Changes in health habits are decided on by patient with goals and time frames set for achieving them. Immunization and cancer screening  needs are specifically addressed at this visit.    Up-to-date with cervical Pap exam, mammogram scheduled  She was encouraged to get her Tdap vaccine against not up-to-date Routine labs ordered

## 2022-04-19 NOTE — Assessment & Plan Note (Signed)
Wt Readings from Last 3 Encounters:  04/19/22 208 lb 9.6 oz (94.6 kg)  06/01/21 197 lb 6.4 oz (89.5 kg)  04/16/21 199 lb (90.3 kg)   Body mass index is 34.71 kg/m.  Patient counseled on low-carb modified diet She was encouraged to engage in regular moderate to vigorous exercise with at least 150 minutes weekly Benefits of healthy weight discussed Starting Ozempic for diabetes

## 2022-04-19 NOTE — Assessment & Plan Note (Signed)
BP Readings from Last 3 Encounters:  04/19/22 (!) 156/80  06/01/21 116/80  04/16/21 114/73  Blood pressure is elevated in the office today Currently on metoprolol 50 mg daily, lisinopril 20 mg daily Reported that she has been taking both medication daily as prescribed but does not check her blood pressure at home, she denies chest pain, dizziness, edema Patient does not want to make changes made to her medications today if blood pressure remains elevated at next visit will make changes to her medication Blood pressure goal is less than 130/80 Follow-up in 4 weeks

## 2022-04-19 NOTE — Patient Instructions (Addendum)
Nurse please get records from [facility] [address].   Please get your TDAP vaccine if you are not up to date .   Marland KitchenGoal for fasting blood sugar ranges from 80 to 120 and 2 hours after any meal or at bedtime should be between 130 to 170.   Around 3 times per week, check your blood pressure 2 times per day. once in the morning and once in the evening. The readings should be at least one minute apart. Write down these values and bring them to your next nurse visit/appointment.  When you check your BP, make sure you have been doing something calm/relaxing 5 minutes prior to checking. Both feet should be flat on the floor and you should be sitting. Use your left arm and make sure it is in a relaxed position (on a table), and that the cuff is at the approximate level/height of your heart.    Blood pressure goal is less than 130/80.    1. Screening for endocrine, nutritional, metabolic and immunity disorder  - CBC with Differential - Lipid Panel - CMP14+EGFR - Vitamin D, 25-hydroxy - TSH  2. Iron deficiency anemia, unspecified iron deficiency anemia type  - Iron, TIBC and Ferritin Panel  3. Controlled type 2 diabetes mellitus with hyperglycemia, without long-term current use of insulin (HCC)  - Microalbumin / creatinine urine ratio - Semaglutide,0.25 or 0.'5MG'$ /DOS, 2 MG/3ML SOPN; Inject 0.25 mg into the skin once a week.  Dispense: 3 mL; Refill: 0     It is important that you exercise regularly at least 30 minutes 5 times a week as tolerated  Think about what you will eat, plan ahead. Choose " clean, green, fresh or frozen" over canned, processed or packaged foods which are more sugary, salty and fatty. 70 to 75% of food eaten should be vegetables and fruit. Three meals at set times with snacks allowed between meals, but they must be fruit or vegetables. Aim to eat over a 12 hour period , example 7 am to 7 pm, and STOP after  your last meal of the day. Drink water,generally  about 64 ounces per day, no other drink is as healthy. Fruit juice is best enjoyed in a healthy way, by EATING the fruit.  Thanks for choosing [facility] we consider it a privelige to serve you.

## 2022-04-20 ENCOUNTER — Other Ambulatory Visit (HOSPITAL_COMMUNITY): Payer: Self-pay

## 2022-04-20 ENCOUNTER — Other Ambulatory Visit: Payer: Self-pay | Admitting: Nurse Practitioner

## 2022-04-20 DIAGNOSIS — E785 Hyperlipidemia, unspecified: Secondary | ICD-10-CM

## 2022-04-20 LAB — CMP14+EGFR
ALT: 26 IU/L (ref 0–32)
AST: 29 IU/L (ref 0–40)
Albumin/Globulin Ratio: 1.4 (ref 1.2–2.2)
Albumin: 4.1 g/dL (ref 3.9–4.9)
Alkaline Phosphatase: 88 IU/L (ref 44–121)
BUN/Creatinine Ratio: 13 (ref 9–23)
BUN: 11 mg/dL (ref 6–24)
Bilirubin Total: 0.4 mg/dL (ref 0.0–1.2)
CO2: 19 mmol/L — ABNORMAL LOW (ref 20–29)
Calcium: 9.2 mg/dL (ref 8.7–10.2)
Chloride: 102 mmol/L (ref 96–106)
Creatinine, Ser: 0.82 mg/dL (ref 0.57–1.00)
Globulin, Total: 3 g/dL (ref 1.5–4.5)
Glucose: 168 mg/dL — ABNORMAL HIGH (ref 70–99)
Potassium: 3.7 mmol/L (ref 3.5–5.2)
Sodium: 139 mmol/L (ref 134–144)
Total Protein: 7.1 g/dL (ref 6.0–8.5)
eGFR: 89 mL/min/{1.73_m2} (ref >59)

## 2022-04-20 LAB — TSH: TSH: 1.54 u[IU]/mL (ref 0.450–4.500)

## 2022-04-20 LAB — IRON,TIBC AND FERRITIN PANEL
Ferritin: 67 ng/mL (ref 15–150)
Iron Saturation: 23 % (ref 15–55)
Iron: 86 ug/dL (ref 27–159)
Total Iron Binding Capacity: 371 ug/dL (ref 250–450)
UIBC: 285 ug/dL (ref 131–425)

## 2022-04-20 LAB — CBC WITH DIFFERENTIAL/PLATELET
Basophils Absolute: 0 10*3/uL (ref 0.0–0.2)
Basos: 0 %
EOS (ABSOLUTE): 0.1 10*3/uL (ref 0.0–0.4)
Eos: 3 %
Hematocrit: 36 % (ref 34.0–46.6)
Hemoglobin: 11.3 g/dL (ref 11.1–15.9)
Immature Grans (Abs): 0 10*3/uL (ref 0.0–0.1)
Immature Granulocytes: 0 %
Lymphocytes Absolute: 1 10*3/uL (ref 0.7–3.1)
Lymphs: 27 %
MCH: 25.8 pg — ABNORMAL LOW (ref 26.6–33.0)
MCHC: 31.4 g/dL — ABNORMAL LOW (ref 31.5–35.7)
MCV: 82 fL (ref 79–97)
Monocytes Absolute: 0.6 10*3/uL (ref 0.1–0.9)
Monocytes: 16 %
Neutrophils Absolute: 2 10*3/uL (ref 1.4–7.0)
Neutrophils: 54 %
Platelets: 358 10*3/uL (ref 150–450)
RBC: 4.38 x10E6/uL (ref 3.77–5.28)
RDW: 15.5 % — ABNORMAL HIGH (ref 11.7–15.4)
WBC: 3.7 10*3/uL (ref 3.4–10.8)

## 2022-04-20 LAB — LIPID PANEL
Chol/HDL Ratio: 4.7 ratio — ABNORMAL HIGH (ref 0.0–4.4)
Cholesterol, Total: 205 mg/dL — ABNORMAL HIGH (ref 100–199)
HDL: 44 mg/dL (ref >39)
LDL Chol Calc (NIH): 118 mg/dL — ABNORMAL HIGH (ref 0–99)
Triglycerides: 245 mg/dL — ABNORMAL HIGH (ref 0–149)
VLDL Cholesterol Cal: 43 mg/dL — ABNORMAL HIGH (ref 5–40)

## 2022-04-20 LAB — VITAMIN D 25 HYDROXY (VIT D DEFICIENCY, FRACTURES): Vit D, 25-Hydroxy: 33.4 ng/mL (ref 30.0–100.0)

## 2022-04-20 MED ORDER — ATORVASTATIN CALCIUM 10 MG PO TABS
10.0000 mg | ORAL_TABLET | Freq: Every day | ORAL | 0 refills | Status: DC
  Filled 2022-04-20: qty 90, 90d supply, fill #0

## 2022-04-26 ENCOUNTER — Other Ambulatory Visit: Payer: Self-pay

## 2022-04-28 ENCOUNTER — Other Ambulatory Visit: Payer: Self-pay

## 2022-04-28 NOTE — Telephone Encounter (Signed)
Called pt and advice message per provider. Kaylee Maxwell

## 2022-04-29 ENCOUNTER — Telehealth: Payer: Self-pay

## 2022-04-29 NOTE — Telephone Encounter (Signed)
Returned call to patient who states that she had not been checking BP at home regularly prior to visit with PCP.  Saw PCP on 04/19/22 and BP was 156/80 there. States she's been checking at home since visit and is getting around 149/100. No headaches, facial flushing, no blurry vision. She states that she has an appt in 1 month with her PCP to follow up on her ozempic and they told her that they'd check on her BP meds then, but they didn't want to make any changes yet since they weren't the ones to place her on the medication initially. Advised she call us for it if needed. She confirms that she is taking lisinopril/hctz 20/12.5mg  daily and Toprol XL 50mg  daily (updated MAR) in the morning. Encouraged her to begin checking her BP daily, roughly 2 hours after taking the medications. Scheduled for first available appt on 05/10/22. She will call us if she needs [place] before then or sees any BP readings higher than this.

## 2022-05-08 ENCOUNTER — Encounter: Payer: Self-pay | Admitting: Cardiovascular Disease

## 2022-05-08 NOTE — Progress Notes (Unsigned)
Cardiology Office Note:    Date:  05/10/2022   ID:  Kaylee Maxwell, DOB Jun 17, 1975, MRN [id number]  PCP:  Renee Rival, FNP  Cardiologist:  Mertie Moores, MD  Electrophysiologist:  None   Referring MD: Renee Rival, FNP   Chief Complaint  Patient presents with   Palpitations     Feb. 10, 2020   Kaylee Maxwell is a 47 y.o. female with a hx of palpitations for years.  We are asked by Renee Rival, FNP to see her for  worsening palpitations .   Has single isolated palpitations.  These occur sporadically.  There is no particular time that she has them worse.  The episodes are not related to eating or drinking.  They are not related to change in position or exercise. The palpitations seem to be worse after she drinks coffee.  She does not get any regular exercise.  She works in the [employer]	[place] for [employer]  She stopped smoking this past December.  She eats an unrestricted diet.  May 10, 2022 Kaylee Maxwell is seen today for follow up of her PVCs and recent HTN She was seen 4 years ago for these PVCs We discussed starting her on metoprolol but she wanted to wait and see if these resolve on their own.  Wt is 207 lbs - up 10 lbs from last year  Has cut back on her salt since finding this HTN  Is back exercising now ( was not prior to this diagnosis )   + fam hx of HTN   Past Medical History:  Diagnosis Date   Anxiety 03/04/2018   Breast cancer screening 03/04/2018   Heart palpitations 03/04/2018   Monitor 9/21: normal sinus rhythm, sinus tachycardia, Avg HR 110; occ PVCs   Hypertension    Irregular heartbeat 03/04/2018   Mild mitral regurgitation    Obesity    PVC's (premature ventricular contractions)    Scalp mass 10/09/2012   Sinus tachycardia    Type 2 diabetes mellitus     Past Surgical History:  Procedure Laterality Date   ANKLE FRACTURE SURGERY     cyst removal from scalp     left leg bone removal      Current Medications: Current  Meds  Medication Sig   allopurinol (ZYLOPRIM) 100 MG tablet Take 1 tablet (100 mg total) by mouth daily.   atorvastatin (LIPITOR) 10 MG tablet Take 1 tablet (10 mg total) by mouth daily.   Blood Glucose Monitoring Suppl (FREESTYLE LITE) w/Device KIT Use as directed   ferrous sulfate 325 (65 FE) MG EC tablet Take 325 mg by mouth daily with breakfast.   glucose blood (GNP TRUE METRIX GLUCOSE STRIPS) test strip Use as instructed   ibuprofen (ADVIL) 800 MG tablet Take 1 tablet (800 mg total) by mouth every 8 (eight) hours as needed.   Lancets (FREESTYLE) lancets Use as directed   metoprolol succinate (TOPROL-XL) 50 MG 24 hr tablet Take 1 tablet (50 mg total) by mouth daily. Take with or immediately following a meal.   Multiple Vitamin (MULTIVITAMIN) tablet Take 1 tablet by mouth daily.   Semaglutide,0.25 or 0.5MG /DOS, 2 MG/3ML SOPN Inject 0.25 mg into the skin once a week.   spironolactone (ALDACTONE) 25 MG tablet Take 1 tablet (25 mg total) by mouth daily.   traZODone (DESYREL) 50 MG tablet Take 1 tablet (50 mg total) by mouth at bedtime as needed for sleep   valsartan (DIOVAN) 80 MG tablet Take 1 tablet (80 mg total)  by mouth daily.   [DISCONTINUED] lisinopril-hydrochlorothiazide (ZESTORETIC) 20-12.5 MG tablet      Allergies:   Patient has no known allergies.   Social History   Socioeconomic History   Marital status: Single    Spouse name: Not on file   Number of children: 2   Years of education: Not on file   Highest education level: Associate degree: academic program  Occupational History   Not on file  Tobacco Use   Smoking status: Former    Packs/day: 0.50    Years: 18.00    Additional pack years: 0.00    Total pack years: 9.00    Types: Cigarettes   Smokeless tobacco: Never  Vaping Use   Vaping Use: Every day   Substances: Nicotine, Flavoring  Substance and Sexual Activity   Alcohol use: Yes    Comment: occasional   Drug use: No   Sexual activity: Yes    Birth  control/protection: None  Other Topics Concern   Not on file  Social History Narrative   Lives with her sons.    Social Determinants of Health   Financial Resource Strain: High Risk (03/21/2018)   Overall Financial Resource Strain (CARDIA)    Difficulty of Paying Living Expenses: Hard  Food Insecurity: No Food Insecurity (03/21/2018)   Hunger Vital Sign    Worried About Running Out of Food in the Last Year: Never true    Ran Out of Food in the Last Year: Never true  Transportation Needs: No Transportation Needs (03/21/2018)   PRAPARE - Hydrologist (Medical): No    Lack of Transportation (Non-Medical): No  Physical Activity: Inactive (03/21/2018)   Exercise Vital Sign    Days of Exercise per Week: 0 days    Minutes of Exercise per Session: 0 min  Stress: Stress Concern Present (03/21/2018)   [place]    Feeling of Stress : Rather much  Social Connections: Moderately Integrated (03/21/2018)   Social Connection and Isolation Panel [NHANES]    Frequency of Communication with Friends and Family: Three times a week    Frequency of Social Gatherings with Friends and Family: Twice a week    Attends Religious Services: More than 4 times per year    Active Member of [employer] or Organizations: Yes    Attends Archivist Meetings: Never    Marital Status: Never married     Family History: The patient's family history includes Autism spectrum disorder in her sister; Cancer in her maternal grandmother and paternal aunt; Diabetes in her mother; Hypertension in her father and mother; Obesity in her mother.  ROS:     EKGs/Labs/Other Studies Reviewed:       EKG:  May 10, 2022:  NSR  at 90 Occasional PVCs  Inc. RBBB  Poor R wave progression - likely lead placement   Recent Labs: 06/01/2021: Magnesium 1.8 04/19/2022: ALT 26; BUN 11; Creatinine, Ser 0.82; Hemoglobin 11.3; Platelets 358;  Potassium 3.7; Sodium 139; TSH 1.540  Recent Lipid Panel    Component Value Date/Time   CHOL 205 (H) 04/19/2022 0941   TRIG 245 (H) 04/19/2022 0941   HDL 44 04/19/2022 0941   CHOLHDL 4.7 (H) 04/19/2022 0941   LDLCALC 118 (H) 04/19/2022 0941    Physical Exam:    Physical Exam: Blood pressure (!) 140/86, pulse 92, height 5\' 5"  (1.651 m), weight 207 lb 6.4 oz (94.1 kg), SpO2 99 %.  HYPERTENSION CONTROL  Vitals:   05/10/22 1544 05/10/22 1556  BP: (!) 160/98 (!) 140/86    The patient's blood pressure is elevated above target today.  In order to address the patient's elevated BP: A new medication was prescribed today.       GEN:  moderately obese female,  in no acute distress HEENT: Normal NECK: No JVD; No carotid bruits LYMPHATICS: No lymphadenopathy CARDIAC: RRR , no murmurs, rubs, gallops RESPIRATORY:  Clear to auscultation without rales, wheezing or rhonchi  ABDOMEN: Soft, non-tender, non-distended MUSCULOSKELETAL:  No edema; No deformity  SKIN: Warm and dry NEUROLOGIC:  Alert and oriented x 3   ASSESSMENT:    1. Essential hypertension     PLAN:       1.  PVCs :  still has some PVCs  .  Stable    2.  HTN:   she has not been taking very good care of herself.  She has gained some weight.  She has been eating lots of salty foods including processed meats.  We had a long discussion about improving her diet.  She has already started an exercise program.  Will discontinue the lisinopril HCTZ.  Will start her on valsartan 80 mg a day and spironolactone 25 mg a day.  Will check a basic metabolic profile in 3 weeks.  I encouraged her to continue to work out.  She will watch her carbohydrates and in calories in general.  I will see her again in 3 months for follow-up visit.     Medication Adjustments/Labs and Tests Ordered: Current medicines are reviewed at length with the patient today.  Concerns regarding medicines are outlined above.  Orders Placed This Encounter   Procedures   Basic metabolic panel   EKG [id number]   Meds ordered this encounter  Medications   valsartan (DIOVAN) 80 MG tablet    Sig: Take 1 tablet (80 mg total) by mouth daily.    Dispense:  90 tablet    Refill:  3   spironolactone (ALDACTONE) 25 MG tablet    Sig: Take 1 tablet (25 mg total) by mouth daily.    Dispense:  90 tablet    Refill:  3    Patient Instructions  Medication Instructions:  Your physician has recommended you make the following change in your medication:  1) STOP taking lisinopril-HCTZ 2) START taking valsartan 80 mg daily  3) START taking spironolactone 25 mg daily   *If you need a refill on your cardiac medications before your next appointment, please call your pharmacy*  Lab Work: IN 3 WEEKS: BMET  If you have labs (blood work) drawn today and your tests are completely normal, you will receive your results only by: George (if you have MyChart) OR A paper copy in the mail If you have any lab test that is abnormal or we need to change your treatment, we will call you to review the results.   Follow-Up: At [facility], you and your health needs are our priority.  As part of our continuing mission to provide you with exceptional heart care, we have created designated Provider Care Teams.  These Care Teams include your primary Cardiologist (physician) and Advanced Practice Providers (APPs -  Physician Assistants and Nurse Practitioners) who all work together to provide you with the care you need, when you need it.  Your next appointment:   3 month(s)  Provider:   Mertie Moores, MD        Signed, Mertie Moores, MD  05/10/2022 4:12 PM    [facility]

## 2022-05-10 ENCOUNTER — Encounter: Payer: Self-pay | Admitting: Cardiovascular Disease

## 2022-05-10 ENCOUNTER — Ambulatory Visit: Payer: 59 | Attending: Cardiovascular Disease | Admitting: Cardiovascular Disease

## 2022-05-10 ENCOUNTER — Other Ambulatory Visit (HOSPITAL_COMMUNITY): Payer: Self-pay

## 2022-05-10 VITALS — BP 140/86 | HR 92 | Ht 65.0 in | Wt 207.4 lb

## 2022-05-10 DIAGNOSIS — I493 Ventricular premature depolarization: Secondary | ICD-10-CM | POA: Diagnosis not present

## 2022-05-10 DIAGNOSIS — I10 Essential (primary) hypertension: Secondary | ICD-10-CM | POA: Diagnosis not present

## 2022-05-10 MED ORDER — VALSARTAN 80 MG PO TABS
80.0000 mg | ORAL_TABLET | Freq: Every day | ORAL | 3 refills | Status: DC
  Filled 2022-05-10: qty 90, 90d supply, fill #0
  Filled 2022-08-08: qty 90, 90d supply, fill #1
  Filled 2022-11-03: qty 90, 90d supply, fill #2
  Filled 2023-02-09: qty 90, 90d supply, fill #3

## 2022-05-10 MED ORDER — SPIRONOLACTONE 25 MG PO TABS
25.0000 mg | ORAL_TABLET | Freq: Every day | ORAL | 3 refills | Status: DC
  Filled 2022-05-10: qty 90, 90d supply, fill #0
  Filled 2022-08-08: qty 90, 90d supply, fill #1
  Filled 2022-11-03: qty 90, 90d supply, fill #2
  Filled 2023-02-09: qty 90, 90d supply, fill #3

## 2022-05-10 NOTE — Patient Instructions (Signed)
Medication Instructions:  Your physician has recommended you make the following change in your medication:  1) STOP taking lisinopril-HCTZ 2) START taking valsartan 80 mg daily  3) START taking spironolactone 25 mg daily   *If you need a refill on your cardiac medications before your next appointment, please call your pharmacy*  Lab Work: IN 3 WEEKS: BMET  If you have labs (blood work) drawn today and your tests are completely normal, you will receive your results only by: Isabela (if you have MyChart) OR A paper copy in the mail If you have any lab test that is abnormal or we need to change your treatment, we will call you to review the results.   Follow-Up: At [facility], you and your health needs are our priority.  As part of our continuing mission to provide you with exceptional heart care, we have created designated Provider Care Teams.  These Care Teams include your primary Cardiologist (physician) and Advanced Practice Providers (APPs -  Physician Assistants and Nurse Practitioners) who all work together to provide you with the care you need, when you need it.  Your next appointment:   3 month(s)  Provider:   Mertie Moores, MD

## 2022-05-17 ENCOUNTER — Other Ambulatory Visit: Payer: Self-pay | Admitting: Pharmacist

## 2022-05-17 NOTE — Progress Notes (Unsigned)
Patient outreached by Michiel Cowboy, PharmD Candidate on 05/17/2022 to discuss hypertension.   Patient has an automated home blood pressure machine. They report home readings have been elevated recently 140s-160s/90s-100s. They endorse feeling anxiety when taking BP because they are afraid of elevated readings. Notably, they tried to get their monitor validated at their last PCP visit, but this was not done despite her asking for it explicitly. She asked specifically that this be documented from our correspondence.   Medication review was performed. They are taking medications as prescribed.   The following barriers to adherence were noted:  - They do not have cost concerns.  - They do not have transportation concerns.  - They do not need assistance obtaining refills.  - They do not occasionally forget to take some of their prescribed medications.  - They do not feel like one/some of their medications make them feel poorly.  - They do not have questions or concerns about their medications.  - They do have follow up scheduled with their primary care provider/cardiologist.   The following interventions were completed:  - Medications were reviewed  - Patient was educated on goal blood pressures and long term health implications of elevated blood pressure  - Patient was educated on proper technique to check home blood pressure and reminded to bring home machine and readings to next provider appointment  - Patient was counseled on lifestyle modifications to improve blood pressure, including limiting salt, caffeine, and processed foods in addition to increasing physical activity throughout the week.   The patient has follow up scheduled:  PCP: 05/24/2022   Michiel Cowboy, PharmD Candidate      Contacted patient to discuss bringing home cuff to the office for comparison to office reading. She'll plan to bring her home cuff in next week for comparison.   Does note some constipation since starting Ozempic,  but overall tolerating well. Denies any concerns since starting spironolactone or valsartan, or since starting atorvastatin. Counseled on these medications as well as goal LDL <70 and goal BP <130/80.     Catie Eppie Gibson, PharmD, BCACP, CPP [facility] [phone]

## 2022-05-18 NOTE — Patient Instructions (Signed)
Kaylee Maxwell,   You can bring your home cuff to the office for comparison on Tuesday.   Please feel free to reach out with questions or concerns! Our goal is for your blood pressure to be consistently less than 130/80, your bad cholesterol (LDL) to be less than 70, and to maintain your A1c less than 7%.   Check your blood pressure once daily, and any time you have concerning symptoms like headache, chest pain, dizziness, shortness of breath, or vision changes.   To appropriately check your blood pressure, make sure you do the following:  1) Avoid caffeine, exercise, or tobacco products for 30 minutes before checking. Empty your bladder. 2) Sit with your back supported in a flat-backed chair. Rest your arm on something flat (arm of the chair, table, etc). 3) Sit still with your feet flat on the floor, resting, for at least 5 minutes.  4) Check your blood pressure. Take 1-2 readings.  5) Write down these readings and bring with you to any provider appointments.  Bring your home blood pressure machine with you to a provider's office for accuracy comparison at least once a year.   Make sure you take your blood pressure medications before you come to any office visit, even if you were asked to fast for labs.   Take care!  Catie Eppie Gibson, PharmD, BCACP, CPP [facility] [phone]

## 2022-05-19 NOTE — Progress Notes (Signed)
Yes we can do this on Tuesday. Gh

## 2022-05-24 ENCOUNTER — Other Ambulatory Visit (HOSPITAL_COMMUNITY): Payer: Self-pay

## 2022-05-24 ENCOUNTER — Other Ambulatory Visit: Payer: Self-pay

## 2022-05-24 ENCOUNTER — Ambulatory Visit (INDEPENDENT_AMBULATORY_CARE_PROVIDER_SITE_OTHER): Payer: 59 | Admitting: Nurse Practitioner

## 2022-05-24 ENCOUNTER — Encounter: Payer: Self-pay | Admitting: Nurse Practitioner

## 2022-05-24 VITALS — BP 130/83 | HR 92 | Temp 97.7°F | Ht 65.0 in | Wt 205.8 lb

## 2022-05-24 DIAGNOSIS — F5101 Primary insomnia: Secondary | ICD-10-CM | POA: Diagnosis not present

## 2022-05-24 DIAGNOSIS — E119 Type 2 diabetes mellitus without complications: Secondary | ICD-10-CM | POA: Diagnosis not present

## 2022-05-24 DIAGNOSIS — I10 Essential (primary) hypertension: Secondary | ICD-10-CM

## 2022-05-24 DIAGNOSIS — M109 Gout, unspecified: Secondary | ICD-10-CM

## 2022-05-24 DIAGNOSIS — E1165 Type 2 diabetes mellitus with hyperglycemia: Secondary | ICD-10-CM

## 2022-05-24 DIAGNOSIS — Z23 Encounter for immunization: Secondary | ICD-10-CM | POA: Diagnosis not present

## 2022-05-24 DIAGNOSIS — E785 Hyperlipidemia, unspecified: Secondary | ICD-10-CM | POA: Diagnosis not present

## 2022-05-24 MED ORDER — ALLOPURINOL 100 MG PO TABS
100.0000 mg | ORAL_TABLET | Freq: Every day | ORAL | 1 refills | Status: DC
  Filled 2022-05-24: qty 90, 90d supply, fill #0
  Filled 2022-08-24: qty 90, 90d supply, fill #1

## 2022-05-24 MED ORDER — TRAZODONE HCL 50 MG PO TABS
50.0000 mg | ORAL_TABLET | Freq: Every evening | ORAL | 1 refills | Status: DC | PRN
  Filled 2022-05-24: qty 90, 90d supply, fill #0
  Filled 2022-11-16: qty 90, 90d supply, fill #1

## 2022-05-24 MED ORDER — SEMAGLUTIDE(0.25 OR 0.5MG/DOS) 2 MG/3ML ~~LOC~~ SOPN
0.5000 mg | PEN_INJECTOR | SUBCUTANEOUS | 3 refills | Status: DC
  Filled 2022-05-24: qty 3, 28d supply, fill #0
  Filled 2022-06-23: qty 3, 28d supply, fill #1
  Filled 2022-08-01: qty 3, 28d supply, fill #2
  Filled 2022-09-06: qty 3, 28d supply, fill #3

## 2022-05-24 NOTE — Assessment & Plan Note (Signed)
Continue atorvastatin 10 mg daily Avoid fatty foods Check fasting lipid panel at next visit

## 2022-05-24 NOTE — Assessment & Plan Note (Signed)
BP Readings from Last 3 Encounters:  05/24/22 130/83  05/10/22 (!) 140/86  04/19/22 (!) 156/80   Currently on metoprolol 50 mg daily, spironolactone 25 mg daily, valsartan 80 mg daily.   HTN Controlled . Continue current medications. No changes in management. Discussed DASH diet and dietary sodium restrictions Continue to increase dietary efforts and exercise.  Follow-up in 3 months.  Appreciate collaboration with Catie the pharmacist and cardiology

## 2022-05-24 NOTE — Addendum Note (Signed)
Addended byRaj Janus on: 05/24/2022 09:13 AM   Modules accepted: Orders

## 2022-05-24 NOTE — Assessment & Plan Note (Signed)
Doing well on Ozempic 0.5 mg once weekly injection Continue current medication Patient counseled on low-carb modified diet Blood sugar goals discussed Checking urine microalbumin labs today Up-to-date with diabetic eye exam records requested Follow-up in 3 months

## 2022-05-24 NOTE — Assessment & Plan Note (Signed)
Patient educated on CDC recommendation for the TDAP  vaccine. Verbal consent was obtained from the patient, vaccine administered by nurse, no sign of adverse reactions noted at this time. Patient education on arm soreness and use of tylenol ofor this patient  was discussed. Patient educated on the signs and symptoms of adverse effect and advise to contact the office if they occur.

## 2022-05-24 NOTE — Patient Instructions (Signed)
1. Primary insomnia  - traZODone (DESYREL) 50 MG tablet; Take 1 tablet (50 mg total) by mouth at bedtime as needed for sleep  Dispense: 90 tablet; Refill: 1  2. Type 2 diabetes mellitus without complication, without long-term current use of insulin  - Microalbumin / creatinine urine ratio  3. Gout, unspecified cause, unspecified chronicity, unspecified site  - allopurinol (ZYLOPRIM) 100 MG tablet; Take 1 tablet (100 mg total) by mouth daily.  Dispense: 90 tablet; Refill: 1  4. Controlled type 2 diabetes mellitus with hyperglycemia, without long-term current use of insulin  - Semaglutide,0.25 or 0.5MG /DOS, 2 MG/3ML SOPN; Inject 0.5 mg into the skin once a week.  Dispense: 3 mL; Refill: 3   Goal for fasting blood sugar ranges from 80 to 120 and 2 hours after any meal or at bedtime should be between 130 to 170.   It is important that you exercise regularly at least 30 minutes 5 times a week as tolerated  Think about what you will eat, plan ahead. Choose " clean, green, fresh or frozen" over canned, processed or packaged foods which are more sugary, salty and fatty. 70 to 75% of food eaten should be vegetables and fruit. Three meals at set times with snacks allowed between meals, but they must be fruit or vegetables. Aim to eat over a 12 hour period , example 7 am to 7 pm, and STOP after  your last meal of the day. Drink water,generally about 64 ounces per day, no other drink is as healthy. Fruit juice is best enjoyed in a healthy way, by EATING the fruit.  Thanks for choosing [facility] we consider it a privelige to serve you.
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
## 2022-05-26 LAB — MICROALBUMIN / CREATININE URINE RATIO
Creatinine, Urine: 194.3 mg/dL
Microalb/Creat Ratio: 109 mg/g creat — ABNORMAL HIGH (ref 0–29)
Microalbumin, Urine: 211.6 ug/mL

## 2022-05-31 ENCOUNTER — Ambulatory Visit: Payer: 59 | Attending: Cardiovascular Disease

## 2022-05-31 ENCOUNTER — Ambulatory Visit
Admission: RE | Admit: 2022-05-31 | Discharge: 2022-05-31 | Disposition: A | Discharge status: Home / Self Care | Payer: 59 | Source: Ambulatory Visit

## 2022-05-31 DIAGNOSIS — Z1231 Encounter for screening mammogram for malignant neoplasm of breast: Secondary | ICD-10-CM

## 2022-05-31 DIAGNOSIS — I10 Essential (primary) hypertension: Secondary | ICD-10-CM

## 2022-06-01 ENCOUNTER — Ambulatory Visit: Payer: 59

## 2022-06-01 LAB — BASIC METABOLIC PANEL
BUN/Creatinine Ratio: 16 (ref 9–23)
BUN: 14 mg/dL (ref 6–24)
CO2: 18 mmol/L — ABNORMAL LOW (ref 20–29)
Calcium: 9.7 mg/dL (ref 8.7–10.2)
Chloride: 103 mmol/L (ref 96–106)
Creatinine, Ser: 0.87 mg/dL (ref 0.57–1.00)
Glucose: 117 mg/dL — ABNORMAL HIGH (ref 70–99)
Potassium: 4.1 mmol/L (ref 3.5–5.2)
Sodium: 139 mmol/L (ref 134–144)
eGFR: 83 mL/min/{1.73_m2} (ref >59)

## 2022-06-02 ENCOUNTER — Other Ambulatory Visit: Payer: Self-pay | Admitting: Nurse Practitioner

## 2022-06-02 ENCOUNTER — Encounter: Payer: Self-pay | Admitting: Nurse Practitioner

## 2022-06-02 DIAGNOSIS — R928 Other abnormal and inconclusive findings on diagnostic imaging of breast: Secondary | ICD-10-CM

## 2022-06-07 ENCOUNTER — Other Ambulatory Visit (HOSPITAL_COMMUNITY): Payer: Self-pay

## 2022-06-07 ENCOUNTER — Other Ambulatory Visit: Payer: Self-pay | Admitting: Cardiovascular Disease

## 2022-06-07 MED ORDER — METOPROLOL SUCCINATE ER 50 MG PO TB24
50.0000 mg | ORAL_TABLET | Freq: Every day | ORAL | 3 refills | Status: DC
  Filled 2022-06-07: qty 90, 90d supply, fill #0
  Filled 2022-08-01 – 2022-09-06 (×2): qty 90, 90d supply, fill #1
  Filled 2022-12-12: qty 90, 90d supply, fill #2
  Filled 2023-03-07: qty 90, 90d supply, fill #3

## 2022-06-07 NOTE — Progress Notes (Signed)
Done. Gh 

## 2022-06-08 ENCOUNTER — Ambulatory Visit
Admission: RE | Admit: 2022-06-08 | Discharge: 2022-06-08 | Disposition: A | Discharge status: Home / Self Care | Payer: 59 | Source: Ambulatory Visit | Attending: Nurse Practitioner | Admitting: Nurse Practitioner

## 2022-06-08 ENCOUNTER — Other Ambulatory Visit: Payer: Self-pay | Admitting: Nurse Practitioner

## 2022-06-08 DIAGNOSIS — R928 Other abnormal and inconclusive findings on diagnostic imaging of breast: Secondary | ICD-10-CM

## 2022-06-08 DIAGNOSIS — R590 Localized enlarged lymph nodes: Secondary | ICD-10-CM | POA: Diagnosis not present

## 2022-06-13 ENCOUNTER — Ambulatory Visit
Admission: RE | Admit: 2022-06-13 | Discharge: 2022-06-13 | Disposition: A | Discharge status: Home / Self Care | Payer: 59 | Source: Ambulatory Visit | Attending: Nurse Practitioner | Admitting: Nurse Practitioner

## 2022-06-13 DIAGNOSIS — R590 Localized enlarged lymph nodes: Secondary | ICD-10-CM | POA: Diagnosis not present

## 2022-06-13 DIAGNOSIS — R928 Other abnormal and inconclusive findings on diagnostic imaging of breast: Secondary | ICD-10-CM

## 2022-06-23 ENCOUNTER — Other Ambulatory Visit: Payer: Self-pay

## 2022-06-23 NOTE — Telephone Encounter (Signed)
Pt was called and advised. KH 

## 2022-08-01 ENCOUNTER — Other Ambulatory Visit: Payer: Self-pay

## 2022-08-01 ENCOUNTER — Other Ambulatory Visit (HOSPITAL_COMMUNITY): Payer: Self-pay

## 2022-08-01 ENCOUNTER — Other Ambulatory Visit: Payer: Self-pay | Admitting: Nurse Practitioner

## 2022-08-01 DIAGNOSIS — E785 Hyperlipidemia, unspecified: Secondary | ICD-10-CM

## 2022-08-01 MED ORDER — ATORVASTATIN CALCIUM 10 MG PO TABS
10.0000 mg | ORAL_TABLET | Freq: Every day | ORAL | 0 refills | Status: DC
Start: 2022-08-01 — End: 2022-10-25
  Filled 2022-08-01: qty 90, 90d supply, fill #0

## 2022-08-08 ENCOUNTER — Encounter: Payer: Self-pay | Admitting: Cardiovascular Disease

## 2022-08-08 ENCOUNTER — Other Ambulatory Visit: Payer: Self-pay

## 2022-08-08 NOTE — Progress Notes (Unsigned)
Cardiology Office Note:    Date:  08/08/2022   ID:  Kaylee Maxwell, DOB 1975/04/22, MRN [id number]  PCP:  Donell Beers, FNP  Cardiologist:  Kristeen Miss, MD  Electrophysiologist:  None   Referring MD: Donell Beers, FNP   Chief Complaint  Patient presents with   Hypertension        Palpitations     Feb. 10, 2020   Kaylee Maxwell is a 47 y.o. female with a hx of palpitations for years.  We are asked by Donell Beers, FNP to see her for  worsening palpitations .   Has single isolated palpitations.  These occur sporadically.  There is no particular time that she has them worse.  The episodes are not related to eating or drinking.  They are not related to change in position or exercise. The palpitations seem to be worse after she drinks coffee.  She does not get any regular exercise.  She works in the [employer] for [employer]  She stopped smoking this past December.  She eats an unrestricted diet.  May 10, 2022 Kaylee Maxwell is seen today for follow up of her PVCs and recent HTN She was seen 4 years ago for these PVCs We discussed starting her on metoprolol but she wanted to wait and see if these resolve on their own.  Wt is 207 lbs - up 10 lbs from last year  Has cut back on her salt since finding this HTN  Is back exercising now ( was not prior to this diagnosis )   + fam hx of HTN   August 09, 2022 Kaylee Maxwell is seen for follow up of her PVCs and HTN Wt is ***   Past Medical History:  Diagnosis Date   Anxiety 03/04/2018   Breast cancer screening 03/04/2018   Heart palpitations 03/04/2018   Monitor 9/21: normal sinus rhythm, sinus tachycardia, Avg HR 110; occ PVCs   Hypertension    Irregular heartbeat 03/04/2018   Mild mitral regurgitation    Obesity    PVC's (premature ventricular contractions)    Scalp mass 10/09/2012   Sinus tachycardia    Type 2 diabetes mellitus (HCC)     Past Surgical History:  Procedure Laterality Date   ANKLE  FRACTURE SURGERY     cyst removal from scalp     left leg bone removal      Current Medications: No outpatient medications have been marked as taking for the 08/09/22 encounter (Office Visit) with Nettie Wyffels, Deloris Ping, MD.     Allergies:   Patient has no known allergies.   Social History   Socioeconomic History   Marital status: Single    Spouse name: Not on file   Number of children: 2   Years of education: Not on file   Highest education level: Associate degree: academic program  Occupational History   Not on file  Tobacco Use   Smoking status: Former    Packs/day: 0.50    Years: 18.00    Additional pack years: 0.00    Total pack years: 9.00    Types: Cigarettes   Smokeless tobacco: Never  Vaping Use   Vaping Use: Every day   Substances: Nicotine, Flavoring  Substance and Sexual Activity   Alcohol use: Yes    Comment: occasional   Drug use: No   Sexual activity: Yes    Birth control/protection: None  Other Topics Concern   Not on file  Social History Narrative   Lives with her  sons.    Social Determinants of Health   Financial Resource Strain: High Risk (03/21/2018)   Overall Financial Resource Strain (CARDIA)    Difficulty of Paying Living Expenses: Hard  Food Insecurity: No Food Insecurity (03/21/2018)   Hunger Vital Sign    Worried About Running Out of Food in the Last Year: Never true    Ran Out of Food in the Last Year: Never true  Transportation Needs: No Transportation Needs (03/21/2018)   PRAPARE - Administrator, Civil Service (Medical): No    Lack of Transportation (Non-Medical): No  Physical Activity: Inactive (03/21/2018)   Exercise Vital Sign    Days of Exercise per Week: 0 days    Minutes of Exercise per Session: 0 min  Stress: Stress Concern Present (03/21/2018)   [employer] of Occupational Health - Occupational Stress Questionnaire    Feeling of Stress : Rather much  Social Connections: Moderately Integrated (03/21/2018)   Social  Connection and Isolation Panel [NHANES]    Frequency of Communication with Friends and Family: Three times a week    Frequency of Social Gatherings with Friends and Family: Twice a week    Attends Religious Services: More than 4 times per year    Active Member of [employer] or Organizations: Yes    Attends Banker Meetings: Never    Marital Status: Never married     Family History: The patient's family history includes Autism spectrum disorder in her sister; Cancer in her maternal grandmother and paternal aunt; Diabetes in her mother; Hypertension in her father and mother; Obesity in her mother.  ROS:     EKGs/Labs/Other Studies Reviewed:       EKG:     Recent Labs: 04/19/2022: ALT 26; Hemoglobin 11.3; Platelets 358; TSH 1.540 05/31/2022: BUN 14; Creatinine, Ser 0.87; Potassium 4.1; Sodium 139  Recent Lipid Panel    Component Value Date/Time   CHOL 205 (H) 04/19/2022 0941   TRIG 245 (H) 04/19/2022 0941   HDL 44 04/19/2022 0941   CHOLHDL 4.7 (H) 04/19/2022 0941   LDLCALC 118 (H) 04/19/2022 0941    Physical Exam:    Physical Exam: There were no vitals taken for this visit.  No BP recorded.  {Refresh Note OR Click here to enter BP  :1}***    GEN:  Well nourished, well developed in no acute distress HEENT: Normal NECK: No JVD; No carotid bruits LYMPHATICS: No lymphadenopathy CARDIAC: RRR ***, no murmurs, rubs, gallops RESPIRATORY:  Clear to auscultation without rales, wheezing or rhonchi  ABDOMEN: Soft, non-tender, non-distended MUSCULOSKELETAL:  No edema; No deformity  SKIN: Warm and dry NEUROLOGIC:  Alert and oriented x 3    ASSESSMENT:    No diagnosis found.   PLAN:       1.  PVCs :       2.  HTN:       Medication Adjustments/Labs and Tests Ordered: Current medicines are reviewed at length with the patient today.  Concerns regarding medicines are outlined above.  No orders of the defined types were placed in this encounter.  No orders  of the defined types were placed in this encounter.   There are no Patient Instructions on file for this visit.   Signed, Kristeen Miss, MD  08/08/2022 8:07 PM    [facility]

## 2022-08-09 ENCOUNTER — Ambulatory Visit: Payer: 59 | Attending: Cardiovascular Disease | Admitting: Cardiovascular Disease

## 2022-08-23 ENCOUNTER — Ambulatory Visit: Payer: Self-pay | Admitting: Nurse Practitioner

## 2022-08-24 ENCOUNTER — Other Ambulatory Visit (HOSPITAL_COMMUNITY): Payer: Self-pay

## 2022-09-06 ENCOUNTER — Other Ambulatory Visit: Payer: Self-pay

## 2022-10-05 ENCOUNTER — Other Ambulatory Visit: Payer: Self-pay | Admitting: Nurse Practitioner

## 2022-10-05 DIAGNOSIS — E1165 Type 2 diabetes mellitus with hyperglycemia: Secondary | ICD-10-CM

## 2022-10-07 ENCOUNTER — Other Ambulatory Visit: Payer: Self-pay

## 2022-10-07 ENCOUNTER — Other Ambulatory Visit (HOSPITAL_COMMUNITY): Payer: Self-pay

## 2022-10-07 MED ORDER — OZEMPIC (0.25 OR 0.5 MG/DOSE) 2 MG/3ML ~~LOC~~ SOPN
0.5000 mg | PEN_INJECTOR | SUBCUTANEOUS | 3 refills | Status: DC
Start: 2022-10-07 — End: 2022-12-07
  Filled 2022-10-07: qty 3, 28d supply, fill #0
  Filled 2022-11-03: qty 3, 28d supply, fill #1

## 2022-10-17 ENCOUNTER — Other Ambulatory Visit (HOSPITAL_COMMUNITY): Payer: Self-pay

## 2022-10-25 ENCOUNTER — Other Ambulatory Visit: Payer: Self-pay | Admitting: Nurse Practitioner

## 2022-10-25 ENCOUNTER — Other Ambulatory Visit: Payer: Self-pay

## 2022-10-25 ENCOUNTER — Other Ambulatory Visit (HOSPITAL_COMMUNITY): Payer: Self-pay

## 2022-10-25 DIAGNOSIS — E785 Hyperlipidemia, unspecified: Secondary | ICD-10-CM

## 2022-10-25 MED ORDER — ATORVASTATIN CALCIUM 10 MG PO TABS
10.0000 mg | ORAL_TABLET | Freq: Every day | ORAL | 0 refills | Status: DC
Start: 2022-10-25 — End: 2022-10-31
  Filled 2022-10-25: qty 90, 90d supply, fill #0

## 2022-10-25 NOTE — Telephone Encounter (Signed)
Appt made Kh

## 2022-10-28 ENCOUNTER — Ambulatory Visit (INDEPENDENT_AMBULATORY_CARE_PROVIDER_SITE_OTHER): Payer: 59 | Admitting: Nurse Practitioner

## 2022-10-28 ENCOUNTER — Encounter: Payer: Self-pay | Admitting: Nurse Practitioner

## 2022-10-28 VITALS — BP 114/65 | HR 102 | Temp 98.7°F | Ht 65.0 in | Wt 206.6 lb

## 2022-10-28 DIAGNOSIS — E119 Type 2 diabetes mellitus without complications: Secondary | ICD-10-CM

## 2022-10-28 DIAGNOSIS — R002 Palpitations: Secondary | ICD-10-CM | POA: Diagnosis not present

## 2022-10-28 DIAGNOSIS — Z23 Encounter for immunization: Secondary | ICD-10-CM | POA: Insufficient documentation

## 2022-10-28 DIAGNOSIS — M1000 Idiopathic gout, unspecified site: Secondary | ICD-10-CM

## 2022-10-28 DIAGNOSIS — I10 Essential (primary) hypertension: Secondary | ICD-10-CM

## 2022-10-28 DIAGNOSIS — M109 Gout, unspecified: Secondary | ICD-10-CM | POA: Insufficient documentation

## 2022-10-28 DIAGNOSIS — E785 Hyperlipidemia, unspecified: Secondary | ICD-10-CM | POA: Diagnosis not present

## 2022-10-28 LAB — POCT GLYCOSYLATED HEMOGLOBIN (HGB A1C): HbA1c, POC (controlled diabetic range): 6.3 % (ref 0.0–7.0)

## 2022-10-28 NOTE — Patient Instructions (Signed)
Goal for fasting blood sugar ranges from 80 to 120 and 2 hours after any meal or at bedtime should be between 130 to 170.   Please get your diabetic eye exam done as discussed   It is important that you exercise regularly at least 30 minutes 5 times a week as tolerated  Think about what you will eat, plan ahead. Choose " clean, green, fresh or frozen" over canned, processed or packaged foods which are more sugary, salty and fatty. 70 to 75% of food eaten should be vegetables and fruit. Three meals at set times with snacks allowed between meals, but they must be fruit or vegetables. Aim to eat over a 12 hour period , example 7 am to 7 pm, and STOP after  your last meal of the day. Drink water,generally about 64 ounces per day, no other drink is as healthy. Fruit juice is best enjoyed in a healthy way, by EATING the fruit.  Thanks for choosing [facility] we consider it a privelige to serve you.

## 2022-10-28 NOTE — Assessment & Plan Note (Signed)
Lab Results  Component Value Date   HGBA1C 6.3 10/28/2022    Currently well-controlled on Ozempic 0.5 mg once weekly injection Continue current medication Patient counseled on low-carb modified diet Encouraged engage in regular moderate exercises at least 150 minutes weekly Plans on getting her diabetic eye exam done soon Signs of hypoglycemia reviewed

## 2022-10-28 NOTE — Assessment & Plan Note (Signed)
BP Readings from Last 3 Encounters:  10/28/22 114/65  05/24/22 130/83  05/10/22 (!) 140/86   HTN Controlled .  On spironolactone 25 mg daily, metoprolol 50 mg daily, valsartan 80 mg daily Continue current medications. No changes in management. Discussed DASH diet and dietary sodium restrictions Continue to increase dietary efforts and exercise.

## 2022-10-28 NOTE — Assessment & Plan Note (Signed)
Continue metoprolol 50 mg daily.

## 2022-10-28 NOTE — Assessment & Plan Note (Signed)
Takes allopurinol 100 mg daily to prevent gout Continue current medication

## 2022-10-28 NOTE — Assessment & Plan Note (Signed)
Currently on atorvastatin 10 mg daily LDL goal is less than 70 Checking lipid panel today Last lipids Lab Results  Component Value Date   CHOL 205 (H) 04/19/2022   HDL 44 04/19/2022   LDLCALC 118 (H) 04/19/2022   TRIG 245 (H) 04/19/2022   CHOLHDL 4.7 (H) 04/19/2022

## 2022-10-28 NOTE — Progress Notes (Signed)
Patient states no real concerns to speak about.

## 2022-10-28 NOTE — Assessment & Plan Note (Signed)
Patient educated on CDC recommendation for the vaccine. Verbal consent was obtained from the patient, vaccine administered by nurse, no sign of adverse reactions noted at this time. Patient education on arm soreness and use of tylenol  for this patient  was discussed. Patient educated on the signs and symptoms of adverse effect and advise to contact the office if they occur.  ?
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
## 2022-10-29 LAB — LIPID PANEL
Chol/HDL Ratio: 3.8 ratio (ref 0.0–4.4)
Cholesterol, Total: 132 mg/dL (ref 100–199)
HDL: 35 mg/dL — ABNORMAL LOW (ref >39)
LDL Chol Calc (NIH): 68 mg/dL (ref 0–99)
Triglycerides: 173 mg/dL — ABNORMAL HIGH (ref 0–149)
VLDL Cholesterol Cal: 29 mg/dL (ref 5–40)

## 2022-10-29 LAB — LDL CHOLESTEROL, DIRECT: LDL Direct: 71 mg/dL (ref 0–99)

## 2022-10-31 ENCOUNTER — Other Ambulatory Visit: Payer: Self-pay | Admitting: Nurse Practitioner

## 2022-10-31 ENCOUNTER — Other Ambulatory Visit (HOSPITAL_COMMUNITY): Payer: Self-pay

## 2022-10-31 DIAGNOSIS — E785 Hyperlipidemia, unspecified: Secondary | ICD-10-CM

## 2022-10-31 MED ORDER — ATORVASTATIN CALCIUM 10 MG PO TABS
10.0000 mg | ORAL_TABLET | Freq: Every day | ORAL | 1 refills | Status: DC
Start: 2022-10-31 — End: 2023-06-06
  Filled 2022-10-31 – 2023-01-30 (×2): qty 90, 90d supply, fill #0

## 2022-11-03 ENCOUNTER — Other Ambulatory Visit: Payer: Self-pay

## 2022-11-03 ENCOUNTER — Other Ambulatory Visit (HOSPITAL_COMMUNITY): Payer: Self-pay

## 2022-11-16 ENCOUNTER — Other Ambulatory Visit (HOSPITAL_COMMUNITY): Payer: Self-pay

## 2022-12-05 ENCOUNTER — Emergency Department (HOSPITAL_COMMUNITY): Payer: 59

## 2022-12-05 ENCOUNTER — Observation Stay (HOSPITAL_COMMUNITY)
Admission: EM | Admit: 2022-12-05 | Discharge: 2022-12-07 | Disposition: A | Discharge status: Home / Self Care | Payer: 59 | Attending: Family Medicine | Admitting: Family Medicine

## 2022-12-05 ENCOUNTER — Encounter (HOSPITAL_COMMUNITY): Payer: Self-pay

## 2022-12-05 ENCOUNTER — Other Ambulatory Visit: Payer: Self-pay

## 2022-12-05 DIAGNOSIS — Z7985 Long-term (current) use of injectable non-insulin antidiabetic drugs: Secondary | ICD-10-CM | POA: Insufficient documentation

## 2022-12-05 DIAGNOSIS — K828 Other specified diseases of gallbladder: Secondary | ICD-10-CM | POA: Diagnosis not present

## 2022-12-05 DIAGNOSIS — K838 Other specified diseases of biliary tract: Secondary | ICD-10-CM | POA: Diagnosis not present

## 2022-12-05 DIAGNOSIS — Z79899 Other long term (current) drug therapy: Secondary | ICD-10-CM | POA: Diagnosis not present

## 2022-12-05 DIAGNOSIS — Z87891 Personal history of nicotine dependence: Secondary | ICD-10-CM | POA: Diagnosis not present

## 2022-12-05 DIAGNOSIS — N2889 Other specified disorders of kidney and ureter: Secondary | ICD-10-CM | POA: Diagnosis not present

## 2022-12-05 DIAGNOSIS — E669 Obesity, unspecified: Secondary | ICD-10-CM | POA: Diagnosis not present

## 2022-12-05 DIAGNOSIS — E119 Type 2 diabetes mellitus without complications: Secondary | ICD-10-CM | POA: Diagnosis not present

## 2022-12-05 DIAGNOSIS — K862 Cyst of pancreas: Secondary | ICD-10-CM

## 2022-12-05 DIAGNOSIS — R109 Unspecified abdominal pain: Secondary | ICD-10-CM | POA: Diagnosis not present

## 2022-12-05 DIAGNOSIS — R1011 Right upper quadrant pain: Secondary | ICD-10-CM | POA: Diagnosis not present

## 2022-12-05 DIAGNOSIS — I10 Essential (primary) hypertension: Secondary | ICD-10-CM | POA: Diagnosis not present

## 2022-12-05 DIAGNOSIS — Z853 Personal history of malignant neoplasm of breast: Secondary | ICD-10-CM | POA: Insufficient documentation

## 2022-12-05 DIAGNOSIS — K7689 Other specified diseases of liver: Secondary | ICD-10-CM | POA: Diagnosis not present

## 2022-12-05 DIAGNOSIS — N289 Disorder of kidney and ureter, unspecified: Secondary | ICD-10-CM | POA: Diagnosis not present

## 2022-12-05 DIAGNOSIS — K5730 Diverticulosis of large intestine without perforation or abscess without bleeding: Secondary | ICD-10-CM | POA: Diagnosis not present

## 2022-12-05 LAB — COMPREHENSIVE METABOLIC PANEL
ALT: 19 U/L (ref 0–44)
AST: 19 U/L (ref 15–41)
Albumin: 3.9 g/dL (ref 3.5–5.0)
Alkaline Phosphatase: 73 U/L (ref 38–126)
Anion gap: 8 (ref 5–15)
BUN: 15 mg/dL (ref 6–20)
CO2: 23 mmol/L (ref 22–32)
Calcium: 9.5 mg/dL (ref 8.9–10.3)
Chloride: 106 mmol/L (ref 98–111)
Creatinine, Ser: 0.74 mg/dL (ref 0.44–1.00)
GFR, Estimated: >60 mL/min (ref >60)
Glucose, Bld: 114 mg/dL — ABNORMAL HIGH (ref 70–99)
Potassium: 3.7 mmol/L (ref 3.5–5.1)
Sodium: 137 mmol/L (ref 135–145)
Total Bilirubin: 0.9 mg/dL (ref 0.3–1.2)
Total Protein: 8.5 g/dL — ABNORMAL HIGH (ref 6.5–8.1)

## 2022-12-05 LAB — CBC
HCT: 37.1 % (ref 36.0–46.0)
Hemoglobin: 11.6 g/dL — ABNORMAL LOW (ref 12.0–15.0)
MCH: 26.6 pg (ref 26.0–34.0)
MCHC: 31.3 g/dL (ref 30.0–36.0)
MCV: 85.1 fL (ref 80.0–100.0)
Platelets: 382 10*3/uL (ref 150–400)
RBC: 4.36 MIL/uL (ref 3.87–5.11)
RDW: 14.4 % (ref 11.5–15.5)
WBC: 6.4 10*3/uL (ref 4.0–10.5)
nRBC: 0 % (ref 0.0–0.2)

## 2022-12-05 LAB — URINALYSIS, ROUTINE W REFLEX MICROSCOPIC
Bilirubin Urine: NEGATIVE
Glucose, UA: NEGATIVE mg/dL
Hgb urine dipstick: NEGATIVE
Ketones, ur: NEGATIVE mg/dL
Leukocytes,Ua: NEGATIVE
Nitrite: NEGATIVE
Protein, ur: 30 mg/dL — AB
Specific Gravity, Urine: 1.02 (ref 1.005–1.030)
pH: 5 (ref 5.0–8.0)

## 2022-12-05 LAB — LIPASE, BLOOD: Lipase: 71 U/L — ABNORMAL HIGH (ref 11–51)

## 2022-12-05 LAB — HCG, SERUM, QUALITATIVE: Preg, Serum: NEGATIVE

## 2022-12-05 MED ORDER — IOHEXOL 300 MG/ML  SOLN
100.0000 mL | Freq: Once | INTRAMUSCULAR | Status: AC | PRN
  Administered 2022-12-05: 100 mL via INTRAVENOUS

## 2022-12-05 MED ORDER — SPIRONOLACTONE 25 MG PO TABS
25.0000 mg | ORAL_TABLET | Freq: Every day | ORAL | Status: DC
  Administered 2022-12-06 – 2022-12-07 (×2): 25 mg via ORAL
  Filled 2022-12-05 (×2): qty 1

## 2022-12-05 MED ORDER — ACETAMINOPHEN 500 MG PO TABS: 1000.0000 mg | ORAL_TABLET | Freq: Four times a day (QID) | ORAL | Status: DC | PRN

## 2022-12-05 MED ORDER — LORAZEPAM 2 MG/ML IJ SOLN
1.0000 mg | Freq: Once | INTRAMUSCULAR | Status: AC | PRN
  Administered 2022-12-05: 1 mg via INTRAVENOUS
  Filled 2022-12-05: qty 1

## 2022-12-05 MED ORDER — ALLOPURINOL 100 MG PO TABS
100.0000 mg | ORAL_TABLET | Freq: Every day | ORAL | Status: DC
  Filled 2022-12-05: qty 1

## 2022-12-05 MED ORDER — TRAZODONE HCL 50 MG PO TABS: 50.0000 mg | ORAL_TABLET | Freq: Every evening | ORAL | Status: DC | PRN

## 2022-12-05 MED ORDER — GADOBUTROL 1 MMOL/ML IV SOLN
10.0000 mL | Freq: Once | INTRAVENOUS | Status: AC | PRN
  Administered 2022-12-05: 10 mL via INTRAVENOUS

## 2022-12-05 MED ORDER — OXYCODONE HCL 5 MG PO TABS
2.5000 mg | ORAL_TABLET | Freq: Four times a day (QID) | ORAL | Status: DC | PRN
  Administered 2022-12-06 – 2022-12-07 (×4): 2.5 mg via ORAL
  Filled 2022-12-05 (×4): qty 1

## 2022-12-05 MED ORDER — FENTANYL CITRATE PF 50 MCG/ML IJ SOSY: 50.0000 ug | PREFILLED_SYRINGE | Freq: Once | INTRAMUSCULAR | Status: DC

## 2022-12-05 MED ORDER — PIPERACILLIN-TAZOBACTAM 3.375 G IVPB 30 MIN
3.3750 g | Freq: Once | INTRAVENOUS | Status: AC
  Administered 2022-12-05: 3.375 g via INTRAVENOUS
  Filled 2022-12-05: qty 50

## 2022-12-05 MED ORDER — ATORVASTATIN CALCIUM 10 MG PO TABS
10.0000 mg | ORAL_TABLET | Freq: Every day | ORAL | Status: DC
  Administered 2022-12-06 – 2022-12-07 (×2): 10 mg via ORAL
  Filled 2022-12-05 (×2): qty 1

## 2022-12-05 MED ORDER — KETOROLAC TROMETHAMINE 15 MG/ML IJ SOLN
15.0000 mg | Freq: Once | INTRAMUSCULAR | Status: AC
  Administered 2022-12-05: 15 mg via INTRAVENOUS
  Filled 2022-12-05: qty 1

## 2022-12-05 MED ORDER — METOPROLOL SUCCINATE ER 50 MG PO TB24
50.0000 mg | ORAL_TABLET | Freq: Every day | ORAL | Status: DC
  Administered 2022-12-06 – 2022-12-07 (×2): 50 mg via ORAL
  Filled 2022-12-05 (×2): qty 1

## 2022-12-05 NOTE — H&P (Signed)
History and Physical    Kaylee Maxwell NWG:[id number] DOB: 04-May-1975 DOA: 12/05/2022  PCP: Donell Beers, FNP   Patient coming from: Home   Chief Complaint:  Chief Complaint  Patient presents with   Abdominal Pain    HPI:  Kaylee Maxwell is a 47 y.o. female with hx of hypertension, diabetes, obesity, PVC, who presents with approximately 4 months of persistent, progressive right upper quadrant pain.  Reports that pain is relatively constant throughout the day, although she has episodes of worsening pain from 3 out of 10 typically to 9 out of 10 at its worst.  Denies any modification with meals, fatty food.  Cannot identify any exacerbating factors.  She has no associated nausea, vomiting.  She has chronic loose stools.  Denies overt diarrhea, no blood in the stool.  No fevers, chills, weight loss, night sweats.   Review of Systems:  ROS complete and negative except as marked above   No Known Allergies  Prior to Admission medications   Medication Sig Start Date End Date Taking? Authorizing Provider  allopurinol (ZYLOPRIM) 100 MG tablet Take 1 tablet (100 mg total) by mouth daily. 05/24/22   Donell Beers, FNP  atorvastatin (LIPITOR) 10 MG tablet Take 1 tablet (10 mg total) by mouth daily. 10/31/22   Donell Beers, FNP  Blood Glucose Monitoring Suppl (FREESTYLE LITE) w/Device KIT Use as directed Patient not taking: Reported on 10/28/2022 04/19/22   Donell Beers, FNP  glucose blood (GNP TRUE METRIX GLUCOSE STRIPS) test strip Use as instructed Patient not taking: Reported on 05/24/2022 04/19/22   Donell Beers, FNP  ibuprofen (ADVIL) 800 MG tablet Take 1 tablet (800 mg total) by mouth every 8 (eight) hours as needed. 06/21/21   Vivi Barrack, DPM  Lancets (FREESTYLE) lancets Use as directed Patient not taking: Reported on 05/24/2022 04/19/22   Donell Beers, FNP  metoprolol succinate (TOPROL-XL) 50 MG 24 hr tablet Take 1 tablet (50 mg total) by mouth  daily. Take with or immediately following a meal. 06/07/22   Nahser, Deloris Ping, MD  Multiple Vitamin (MULTIVITAMIN) tablet Take 1 tablet by mouth daily. Patient not taking: Reported on 10/28/2022    [provider]  Semaglutide,0.25 or 0.5MG /DOS, (OZEMPIC, 0.25 OR 0.5 MG/DOSE,) 2 MG/3ML SOPN Inject 0.5 mg into the skin once a week. 10/07/22   Donell Beers, FNP  spironolactone (ALDACTONE) 25 MG tablet Take 1 tablet (25 mg total) by mouth daily. 05/10/22   Nahser, Deloris Ping, MD  traZODone (DESYREL) 50 MG tablet Take 1 tablet (50 mg total) by mouth at bedtime as needed for sleep 05/24/22   Donell Beers, FNP  valsartan (DIOVAN) 80 MG tablet Take 1 tablet (80 mg total) by mouth daily. 05/10/22   Nahser, Deloris Ping, MD  cetirizine (ZYRTEC ALLERGY) 10 MG tablet Take 1 tablet (10 mg total) by mouth daily. 03/04/20 04/20/20  Wallis Bamberg, PA-C    Past Medical History:  Diagnosis Date   Anxiety 03/04/2018   Breast cancer screening 03/04/2018   Heart palpitations 03/04/2018   Monitor 9/21: normal sinus rhythm, sinus tachycardia, Avg HR 110; occ PVCs   Hypertension    Irregular heartbeat 03/04/2018   Mild mitral regurgitation    Obesity    PVC's (premature ventricular contractions)    Scalp mass 10/09/2012   Sinus tachycardia    Type 2 diabetes mellitus (HCC)     Past Surgical History:  Procedure Laterality Date   ANKLE FRACTURE SURGERY  cyst removal from scalp     left leg bone removal       reports that she has quit smoking. Her smoking use included cigarettes. She has a 9 pack-year smoking history. She has never used smokeless tobacco. She reports current alcohol use. She reports that she does not use drugs.  Family History  Problem Relation Age of Onset   Diabetes Mother    Hypertension Mother    Obesity Mother    Hypertension Father    Cancer Paternal Aunt        pancreatic   Cancer Maternal Grandmother        lung   Autism spectrum disorder Sister      Physical  Exam: Vitals:   12/05/22 1130 12/05/22 1459 12/05/22 1709 12/05/22 1921  BP:  133/81 125/78 (!) 141/70  Pulse:  84 73 72  Resp:  16 18 18   Temp:  97.7 F (36.5 C) (!) 97.5 F (36.4 C) 97.6 F (36.4 C)  TempSrc:  Oral Oral Oral  SpO2:  100% 100% 100%  Weight: 92.1 kg     Height: 5\' 5"  (1.651 m)       Gen: Awake, alert, NAD, well-appearing CV: Regular, normal S1, S2, no murmurs  Resp: Normal WOB, CTAB  Abd: Obese, normoactive, mild right upper quadrant tenderness.  No rebound, guarding, rigidity MSK: Symmetric, no edema  Skin: No rashes or lesions to exposed skin  Neuro: Alert and interactive  Psych: euthymic, appropriate    Data review:   Labs reviewed, notable for:   LFT are normal Lipase 71  Micro:  Results for orders placed or performed in visit on 04/20/20  Chlamydia/Gonococcus/Trichomonas, NAA     Status: None   Collection Time: 04/20/20 10:27 AM   Specimen: Urine   UR  Result Value Ref Range Status   Chlamydia by NAA Negative Negative Final   Gonococcus by NAA Negative Negative Final   Trich vag by NAA Negative Negative Final    Imaging reviewed:  CT ABDOMEN PELVIS W CONTRAST  Result Date: 12/05/2022 CLINICAL DATA:  Abdominal pain for several months. EXAM: CT ABDOMEN AND PELVIS WITH CONTRAST TECHNIQUE: Multidetector CT imaging of the abdomen and pelvis was performed using the standard protocol following bolus administration of intravenous contrast. RADIATION DOSE REDUCTION: This exam was performed according to the departmental dose-optimization program which includes automated exposure control, adjustment of the mA and/or kV according to patient size and/or use of iterative reconstruction technique. CONTRAST:  OMNIPAQUE IOHEXOL 300 MG/ML  SOLN COMPARISON:  None Available. FINDINGS: Lower chest: There is some linear opacity lung bases likely scar or atelectasis. No pleural effusion. Hepatobiliary: Patent portal vein. Benign-appearing segment 8 hepatic cyst  measuring 19 mm. Few thin septations. Few other tiny low-attenuation foci in the liver seen which are too small to completely characterize although again likely benign cystic foci. Gallbladder is mildly distended. There is a dilated common duct with what appears to be an abrupt cutoff at the ampulla. Stone or other lesion is possible. No significant intrahepatic biliary ductal dilatation. Recommend further evaluation. Pancreas: Unremarkable. No pancreatic ductal dilatation or surrounding inflammatory changes. Spleen: Normal in size without focal abnormality. Adrenals/Urinary Tract: The adrenal glands are preserved. No right-sided enhancing renal mass. No bilateral renal collecting system dilatation. The ureters have normal course and caliber extending down to the urinary bladder. There is a mass lesion along the lower aspect of the left kidney extending posteriorly measuring 3.1 by 2.8 cm with Hounsfield units of  126. Based on appearance this has worrisome for a renal cell carcinoma or neoplasm. Please correlate with any prior recommend dedicated imaging. Preserved contours of the urinary bladder. Stomach/Bowel: On this non oral contrast exam, the large bowel has a normal course and caliber with scattered stool. Normal appendix. There is left-sided colonic diverticula. In the sigmoid region there is wall thickening with stranding and vascular engorgement consistent with a area of diverticulitis. No complicating findings of free intra-abdominal air, rim enhancing fluid collection or obstruction. The stomach and small bowel are nondilated. Vascular/Lymphatic: Retroaortic left renal vein. Normal caliber aorta and IVC. Mild scattered atherosclerotic calcifications. No specific abnormal lymph node enlargement identified in the abdomen and pelvis. Reproductive: Uterus is present. Multiple fibroids. No separate adnexal mass. Other: No free air or free fluid. Musculoskeletal: Scattered degenerative changes of the spine and  pelvis. 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 Please correlate for known history. IMPRESSION: Wall thickening with stranding and diverticula along the sigmoid colon. Please correlate for colitis or diverticulitis. Recommend follow up after treatment to confirm clearance to exclude secondary pathology. No obstruction, abscess formation or free air. High density left-sided renal mass worrisome for enhancing lesion and possible renal cell carcinoma. Please correlate with prior or dedicated workup with the precontrast dataset with CT when appropriate or a pre and postcontrast MRI. Mildly dilated common bile duct with cut off appearance of the ampulla, pancreatic head. Underlying lesion is not excluded. Recommend further evaluation with MRI/MRCP when appropriate Electronically Signed   By: Karen Kays M.D.   On: 12/05/2022 17:42   US Abdomen Limited RUQ (LIVER/GB)  Result Date: 12/05/2022 CLINICAL DATA:  Right upper quadrant pain EXAM: ULTRASOUND ABDOMEN LIMITED RIGHT UPPER QUADRANT COMPARISON:  None Available. FINDINGS: Gallbladder: No gallstones or wall thickening visualized. No sonographic Murphy sign noted by sonographer. Common bile duct: Diameter: 9 mm Liver: No focal lesion identified. Within normal limits in parenchymal echogenicity. Benign hepatic cysts identified with some septations and through transmission in the right hepatic lobe measuring 18 x 18 x 18 mm. Portal vein is patent on color Doppler imaging with normal direction of blood flow towards the liver. Other: None. IMPRESSION: Dilated common duct. Recommend further workup such as MRI/MRCP when clinically appropriate. No gallstones. Electronically Signed   By: Karen Kays M.D.   On: 12/05/2022 17:35     ED Course:  Imaging obtained per above ED provider consulted with [facility] dr. Ewing Schlein, who recommends for MRI/ MRCP and group will see in the morning.  Treated with Zosyn for  possible diverticulitis.  Fentanyl for pain.   Assessment/Plan:  47 y.o. female with hx hypertension, diabetes, obesity, PVC, who presents with approximately 4 months of persistent, progressive right upper quadrant pain.  Found to have CBD dilation with question of distal lesion.  Possible diverticulitis, relatively asymptomatic from that standpoint.  Incidental finding of a left lower renal mass, suspect RCC.  CBD dilation with question of distal lesion Prolonged symptoms for 4 months, question of colic although denies modification with meals.  LFT are normal.  Lipase is 71.  On right upper quadrant ultrasound CBD measures 9 mm.  Subsequent CT with abrupt cut off near the ampulla. [facility] consulted by ED, will see patient in the morning - MRI MRCP pending - Low-fat diet, n.p.o. after midnight in case needs ERCP/EUS  Question of diverticulitis Abdominal pain mainly right-sided.  Reports history of loose stool.  WBC within normal limit.  CT with findings of wall thickening, stranding, vascular engorgement suggestive of sigmoid diverticulitis. -Continue Zosyn 337 5 mg IV every 8 hours for now.  Likely can be transitioned to oral antibiotics soon as tomorrow. -Okay for low-fat diet as tolerates. -She should have an outpatient colonoscopy after completion of treatment.  Incidental finding left lower renal mass, suspect RCC CT with 3.1 x 2.8 cm left lower renal mass concerning for RCC. -Please contact urology in a.m. to arrange for close follow-up (did not contact overnight)  Chronic medical problems: Hypertension: Valsartan is nonformulary.  Continue spironolactone, metoprolol Hyperlipidemia: Continue home atorvastatin Obesity: On Ozempic outpatient Insomnia: Continue home trazodone Gout: Continue allopurinol  Body mass index is 33.78 kg/m.    DVT prophylaxis:  SCDs Code Status:  Full Code Diet:  Diet Orders (From admission, onward)     Start     Ordered   12/06/22 0001  Diet  NPO time specified  Diet effective midnight        12/05/22 1948   12/05/22 1948  Diet Heart Room service appropriate? Yes; Fluid consistency: Thin  Diet effective now       Question Answer Comment  Room service appropriate? Yes   Fluid consistency: Thin      12/05/22 1948           Family Communication:  Yes discussed with son at bedside   Consults:  [facility]   Admission status:   Observation, Med-Surg  Severity of Illness: The appropriate patient status for this patient is OBSERVATION. Observation status is judged to be reasonable and necessary in order to provide the required intensity of service to ensure the patient's safety. The patient's presenting symptoms, physical exam findings, and initial radiographic and laboratory data in the context of their medical condition is felt to place them at decreased risk for further clinical deterioration. Furthermore, it is anticipated that the patient will be medically stable for discharge from the hospital within 2 midnights of admission.    Dolly Rias, MD [facility]  How to contact the [facility] Attending or Consulting provider 7A - 7P or covering provider during after hours 7P -7A, for this patient.  Check the care team in [facility] and look for a) attending/consulting [facility] provider listed and b) the [facility] team listed Log into [email or web address] and use [place]'s universal password to access. If you do not have the password, please contact the hospital operator. Locate the [facility] provider you are looking for under [facility] and page to a number that you can be directly reached. If you still have difficulty reaching the provider, please page the [facility] (Director on Call) for the Hospitalists listed on amion for assistance.  12/05/2022, 7:51 PM

## 2022-12-05 NOTE — ED Notes (Signed)
[facility] has arrived for pt, pt appears to be in NAD at this time

## 2022-12-05 NOTE — ED Notes (Signed)
.ED TO INPATIENT HANDOFF REPORT  Name/Age/Gender Kaylee Maxwell 47 y.o. female  Code Status    Code Status Orders  (From admission, onward)           Start     Ordered   12/05/22 1947  Full code  Continuous       Question:  By:  Answer:  Consent: discussion documented in EHR   12/05/22 1948           Code Status History     This patient has a current code status but no historical code status.       Home/SNF/Other Home  Chief Complaint Dilated cbd, acquired [K83.8]  Level of Care/Admitting Diagnosis ED Disposition     ED Disposition  Admit   Condition  --   Comment  Hospital Area: [facility] [[id number]]  Level of Care: Med-Surg [16]  May place patient in observation at [facility] or [facility] if equivalent level of care is available:: No  Covid Evaluation: Asymptomatic - no recent exposure (last 10 days) testing not required  Diagnosis: Dilated cbd, acquired [063016]  Admitting Physician: Dolly Rias [[id number]]  Attending Physician: Dolly Rias [[id number]]          Medical History Past Medical History:  Diagnosis Date   Anxiety 03/04/2018   Breast cancer screening 03/04/2018   Heart palpitations 03/04/2018   Monitor 9/21: normal sinus rhythm, sinus tachycardia, Avg HR 110; occ PVCs   Hypertension    Irregular heartbeat 03/04/2018   Mild mitral regurgitation    Obesity    PVC's (premature ventricular contractions)    Scalp mass 10/09/2012   Sinus tachycardia    Type 2 diabetes mellitus (HCC)     Allergies No Known Allergies  IV Location/Drains/Wounds Patient Lines/Drains/Airways Status     Active Line/Drains/Airways     Name Placement date Placement time Site Days   Peripheral IV 12/05/22 20 G Left Antecubital 12/05/22  1706  Antecubital  less than 1            Labs/Imaging Results for orders placed or performed during the hospital encounter of 12/05/22 (from the past 48 hour(s))  Lipase, blood      Status: Abnormal   Collection Time: 12/05/22 11:39 AM  Result Value Ref Range   Lipase 71 (H) 11 - 51 U/L    Comment: Performed at [facility], [address] [place], [place] [address]  Comprehensive metabolic panel     Status: Abnormal   Collection Time: 12/05/22 11:39 AM  Result Value Ref Range   Sodium 137 135 - 145 mmol/L   Potassium 3.7 3.5 - 5.1 mmol/L   Chloride 106 98 - 111 mmol/L   CO2 23 22 - 32 mmol/L   Glucose, Bld 114 (H) 70 - 99 mg/dL    Comment: Glucose reference range applies only to samples taken after fasting for at least 8 hours.   BUN 15 6 - 20 mg/dL   Creatinine, Ser 0.62 0.44 - 1.00 mg/dL   Calcium 9.5 8.9 - 37.6 mg/dL   Total Protein 8.5 (H) 6.5 - 8.1 g/dL   Albumin 3.9 3.5 - 5.0 g/dL   AST 19 15 - 41 U/L   ALT 19 0 - 44 U/L   Alkaline Phosphatase 73 38 - 126 U/L   Total Bilirubin 0.9 0.3 - 1.2 mg/dL   GFR, Estimated >28 >31 mL/min    Comment: (NOTE) Calculated using the CKD-EPI Creatinine Equation (2021)  Anion gap 8 5 - 15    Comment: Performed at [facility], [address], [place], [place] [address]  CBC     Status: Abnormal   Collection Time: 12/05/22 11:39 AM  Result Value Ref Range   WBC 6.4 4.0 - 10.5 K/uL   RBC 4.36 3.87 - 5.11 MIL/uL   Hemoglobin 11.6 (L) 12.0 - 15.0 g/dL   HCT 60.4 54.0 - 98.1 %   MCV 85.1 80.0 - 100.0 fL   MCH 26.6 26.0 - 34.0 pg   MCHC 31.3 30.0 - 36.0 g/dL   RDW 19.1 47.8 - 29.5 %   Platelets 382 150 - 400 K/uL   nRBC 0.0 0.0 - 0.2 %    Comment: Performed at [facility], [address], [place], [place] [address]  hCG, serum, qualitative     Status: None   Collection Time: 12/05/22 11:39 AM  Result Value Ref Range   Preg, Serum NEGATIVE NEGATIVE    Comment:        THE SENSITIVITY OF THIS METHODOLOGY IS >10 mIU/mL. Performed at [facility], [address], [place], [place] [address]   Urinalysis, Routine w reflex microscopic  -Urine, Clean Catch     Status: Abnormal   Collection Time: 12/05/22  1:10 PM  Result Value Ref Range   Color, Urine YELLOW YELLOW   APPearance HAZY (A) CLEAR   Specific Gravity, Urine 1.020 1.005 - 1.030   pH 5.0 5.0 - 8.0   Glucose, UA NEGATIVE NEGATIVE mg/dL   Hgb urine dipstick NEGATIVE NEGATIVE   Bilirubin Urine NEGATIVE NEGATIVE   Ketones, ur NEGATIVE NEGATIVE mg/dL   Protein, ur 30 (A) NEGATIVE mg/dL   Nitrite NEGATIVE NEGATIVE   Leukocytes,Ua NEGATIVE NEGATIVE   RBC / HPF 0-5 0 - 5 RBC/hpf   WBC, UA 0-5 0 - 5 WBC/hpf   Bacteria, UA RARE (A) NONE SEEN   Squamous Epithelial / HPF 11-20 0 - 5 /HPF   Mucus PRESENT     Comment: Performed at [facility], [address], [place], [place] [address]   CT ABDOMEN PELVIS W CONTRAST  Result Date: 12/05/2022 CLINICAL DATA:  Abdominal pain for several months. EXAM: CT ABDOMEN AND PELVIS WITH CONTRAST TECHNIQUE: Multidetector CT imaging of the abdomen and pelvis was performed using the standard protocol following bolus administration of intravenous contrast. RADIATION DOSE REDUCTION: This exam was performed according to the departmental dose-optimization program which includes automated exposure control, adjustment of the mA and/or kV according to patient size and/or use of iterative reconstruction technique. CONTRAST:  OMNIPAQUE IOHEXOL 300 MG/ML  SOLN COMPARISON:  None Available. FINDINGS: Lower chest: There is some linear opacity lung bases likely scar or atelectasis. No pleural effusion. Hepatobiliary: Patent portal vein. Benign-appearing segment 8 hepatic cyst measuring 19 mm. Few thin septations. Few other tiny low-attenuation foci in the liver seen which are too small to completely characterize although again likely benign cystic foci. Gallbladder is mildly distended. There is a dilated common duct with what appears to be an abrupt cutoff at the ampulla. Stone or other lesion is possible. No significant intrahepatic  biliary ductal dilatation. Recommend further evaluation. Pancreas: Unremarkable. No pancreatic ductal dilatation or surrounding inflammatory changes. Spleen: Normal in size without focal abnormality. Adrenals/Urinary Tract: The adrenal glands are preserved. No right-sided enhancing renal mass. No bilateral renal collecting system dilatation. The ureters have normal course and caliber extending down to the urinary bladder. There is a  mass lesion along the lower aspect of the left kidney extending posteriorly measuring 3.1 by 2.8 cm with Hounsfield units of 126. Based on appearance this has worrisome for a renal cell carcinoma or neoplasm. Please correlate with any prior recommend dedicated imaging. Preserved contours of the urinary bladder. Stomach/Bowel: On this non oral contrast exam, the large bowel has a normal course and caliber with scattered stool. Normal appendix. There is left-sided colonic diverticula. In the sigmoid region there is wall thickening with stranding and vascular engorgement consistent with a area of diverticulitis. No complicating findings of free intra-abdominal air, rim enhancing fluid collection or obstruction. The stomach and small bowel are nondilated. Vascular/Lymphatic: Retroaortic left renal vein. Normal caliber aorta and IVC. Mild scattered atherosclerotic calcifications. No specific abnormal lymph node enlargement identified in the abdomen and pelvis. Reproductive: Uterus is present. Multiple fibroids. No separate adnexal mass. Other: No free air or free fluid. Musculoskeletal: Scattered degenerative changes of the spine and pelvis. 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 Please correlate for known history. IMPRESSION: Wall thickening with stranding and diverticula along the sigmoid colon. Please correlate for colitis or diverticulitis. Recommend follow up after treatment to confirm clearance to exclude secondary  pathology. No obstruction, abscess formation or free air. High density left-sided renal mass worrisome for enhancing lesion and possible renal cell carcinoma. Please correlate with prior or dedicated workup with the precontrast dataset with CT when appropriate or a pre and postcontrast MRI. Mildly dilated common bile duct with cut off appearance of the ampulla, pancreatic head. Underlying lesion is not excluded. Recommend further evaluation with MRI/MRCP when appropriate Electronically Signed   By: Karen Kays M.D.   On: 12/05/2022 17:42   US Abdomen Limited RUQ (LIVER/GB)  Result Date: 12/05/2022 CLINICAL DATA:  Right upper quadrant pain EXAM: ULTRASOUND ABDOMEN LIMITED RIGHT UPPER QUADRANT COMPARISON:  None Available. FINDINGS: Gallbladder: No gallstones or wall thickening visualized. No sonographic Murphy sign noted by sonographer. Common bile duct: Diameter: 9 mm Liver: No focal lesion identified. Within normal limits in parenchymal echogenicity. Benign hepatic cysts identified with some septations and through transmission in the right hepatic lobe measuring 18 x 18 x 18 mm. Portal vein is patent on color Doppler imaging with normal direction of blood flow towards the liver. Other: None. IMPRESSION: Dilated common duct. Recommend further workup such as MRI/MRCP when clinically appropriate. No gallstones. Electronically Signed   By: Karen Kays M.D.   On: 12/05/2022 17:35    Pending Labs Unresulted Labs (From admission, onward)     Start     Ordered   12/06/22 0500  HIV Antibody (routine testing w rflx)  (HIV Antibody (Routine testing w reflex) panel)  Tomorrow morning,   R        12/05/22 1948   12/06/22 0500  Basic metabolic panel  Tomorrow morning,   R        12/05/22 1948   12/06/22 0500  CBC  Tomorrow morning,   R        12/05/22 1948   12/06/22 0500  Magnesium  Tomorrow morning,   R        12/05/22 1948   12/06/22 0500  Phosphorus  Tomorrow morning,   R        12/05/22 1948   12/06/22  0500  Hepatic function panel  Tomorrow morning,   R        12/05/22 1948  Vitals/Pain Today's Vitals   12/05/22 1130 12/05/22 1459 12/05/22 1709 12/05/22 1921  BP:  133/81 125/78 (!) 141/70  Pulse:  84 73 72  Resp:  16 18 18   Temp:  97.7 F (36.5 C) (!) 97.5 F (36.4 C) 97.6 F (36.4 C)  TempSrc:  Oral Oral Oral  SpO2:  100% 100% 100%  Weight: 92.1 kg     Height: 5\' 5"  (1.651 m)     PainSc: 7        Isolation Precautions No active isolations  Medications Medications  fentaNYL (SUBLIMAZE) injection 50 mcg (50 mcg Intravenous Patient Refused/Not Given 12/05/22 1901)  acetaminophen (TYLENOL) tablet 1,000 mg (has no administration in time range)  oxyCODONE (Oxy IR/ROXICODONE) immediate release tablet 2.5 mg (has no administration in time range)  iohexol (OMNIPAQUE) 300 MG/ML solution 100 mL (100 mLs Intravenous Contrast Given 12/05/22 1713)  ketorolac (TORADOL) 15 MG/ML injection 15 mg (15 mg Intravenous Given 12/05/22 1858)  piperacillin-tazobactam (ZOSYN) IVPB 3.375 g (0 g Intravenous Stopped 12/05/22 1949)  LORazepam (ATIVAN) injection 1 mg (1 mg Intravenous Given 12/05/22 1948)    Mobility walks

## 2022-12-05 NOTE — ED Triage Notes (Signed)
Pt arrived via POV. C/o RUQ abd pain for several months. Pain is intermittent and has no known triggers.  No other complaints

## 2022-12-05 NOTE — Progress Notes (Signed)
New Admission Note:    Arrival Method: ED stretcher Mental Orientation: a/ox4 Telemetry: n/a Assessment:completed Skin: intact IV: Left ac Pain:n/a Tubes: n/a Safety Measures: non-skid socks Admission: completed Orientation: Patient has been oriented to the room, unit and staff.  Family: son Belongings: at bedside  Orders have been reviewed and implemented. Will continue to monitor the patient. Call light has been placed within reach and bed alarm has been activated.   Fabian Sharp BSN, RN-BC Phone number: [phone]

## 2022-12-05 NOTE — ED Provider Notes (Signed)
[place] EMERGENCY DEPARTMENT AT [facility] Provider Note   CSN: [id number] Arrival date & time: 12/05/22  1115     History  Chief Complaint  Patient presents with   Abdominal Pain    Kaylee Maxwell is a 47 y.o. female.   Abdominal Pain Associated symptoms: nausea      47 year old female with medical history significant for hypertension, anxiety, obesity, diabetes mellitus who presents to the emergency department with roughly 3 to 4 months of right upper quadrant abdominal pain.  The patient endorses colicky right upper quadrant pain with mild associated nausea that has been bothering her for the past few months.  Pain flared up today prompting her presentation to the emergency department.  She denies any vomiting, she is passing gas and moving her bowels.  Last bowel movement was this morning.  She denies any fevers or chills.  Denies any recent weight loss.   Home Medications Prior to Admission medications   Medication Sig Start Date End Date Taking? Authorizing Provider  allopurinol (ZYLOPRIM) 100 MG tablet Take 1 tablet (100 mg total) by mouth daily. 05/24/22   Donell Beers, FNP  atorvastatin (LIPITOR) 10 MG tablet Take 1 tablet (10 mg total) by mouth daily. 10/31/22   Donell Beers, FNP  Blood Glucose Monitoring Suppl (FREESTYLE LITE) w/Device KIT Use as directed Patient not taking: Reported on 10/28/2022 04/19/22   Donell Beers, FNP  glucose blood (GNP TRUE METRIX GLUCOSE STRIPS) test strip Use as instructed Patient not taking: Reported on 05/24/2022 04/19/22   Donell Beers, FNP  ibuprofen (ADVIL) 800 MG tablet Take 1 tablet (800 mg total) by mouth every 8 (eight) hours as needed. 06/21/21   Vivi Barrack, DPM  Lancets (FREESTYLE) lancets Use as directed Patient not taking: Reported on 05/24/2022 04/19/22   Donell Beers, FNP  metoprolol succinate (TOPROL-XL) 50 MG 24 hr tablet Take 1 tablet (50 mg total) by mouth daily. Take with  or immediately following a meal. 06/07/22   Nahser, Deloris Ping, MD  Multiple Vitamin (MULTIVITAMIN) tablet Take 1 tablet by mouth daily. Patient not taking: Reported on 10/28/2022    [provider]  Semaglutide,0.25 or 0.5MG /DOS, (OZEMPIC, 0.25 OR 0.5 MG/DOSE,) 2 MG/3ML SOPN Inject 0.5 mg into the skin once a week. 10/07/22   Donell Beers, FNP  spironolactone (ALDACTONE) 25 MG tablet Take 1 tablet (25 mg total) by mouth daily. 05/10/22   Nahser, Deloris Ping, MD  traZODone (DESYREL) 50 MG tablet Take 1 tablet (50 mg total) by mouth at bedtime as needed for sleep 05/24/22   Donell Beers, FNP  valsartan (DIOVAN) 80 MG tablet Take 1 tablet (80 mg total) by mouth daily. 05/10/22   Nahser, Deloris Ping, MD  cetirizine (ZYRTEC ALLERGY) 10 MG tablet Take 1 tablet (10 mg total) by mouth daily. 03/04/20 04/20/20  Wallis Bamberg, PA-C      Allergies    Patient has no known allergies.    Review of Systems   Review of Systems  Gastrointestinal:  Positive for abdominal pain and nausea.  All other systems reviewed and are negative.   Physical Exam Updated Vital Signs BP 133/81 (BP Location: Left Arm)   Pulse 84   Temp 97.7 F (36.5 C) (Oral)   Resp 16   Ht 5\' 5"  (1.651 m)   Wt 92.1 kg   SpO2 100%   BMI 33.78 kg/m  Physical Exam Vitals and nursing note reviewed.  Constitutional:  General: She is not in acute distress.    Appearance: She is well-developed.  HENT:     Head: Normocephalic and atraumatic.  Eyes:     Conjunctiva/sclera: Conjunctivae normal.  Cardiovascular:     Rate and Rhythm: Normal rate and regular rhythm.  Pulmonary:     Effort: Pulmonary effort is normal. No respiratory distress.     Breath sounds: Normal breath sounds.  Abdominal:     Palpations: Abdomen is soft.     Tenderness: There is abdominal tenderness in the right upper quadrant and epigastric area. There is no guarding or rebound. Negative signs include Murphy's sign.  Musculoskeletal:        General:  No swelling.     Cervical back: Neck supple.  Skin:    General: Skin is warm and dry.     Capillary Refill: Capillary refill takes less than 2 seconds.  Neurological:     Mental Status: She is alert.  Psychiatric:        Mood and Affect: Mood normal.     ED Results / Procedures / Treatments   Labs (all labs ordered are listed, but only abnormal results are displayed) Labs Reviewed  LIPASE, BLOOD - Abnormal; Notable for the following components:      Result Value   Lipase 71 (*)    All other components within normal limits  COMPREHENSIVE METABOLIC PANEL - Abnormal; Notable for the following components:   Glucose, Bld 114 (*)    Total Protein 8.5 (*)    All other components within normal limits  CBC - Abnormal; Notable for the following components:   Hemoglobin 11.6 (*)    All other components within normal limits  URINALYSIS, ROUTINE W REFLEX MICROSCOPIC - Abnormal; Notable for the following components:   APPearance HAZY (*)    Protein, ur 30 (*)    Bacteria, UA RARE (*)    All other components within normal limits  HCG, SERUM, QUALITATIVE    EKG None  Radiology No results found.  Procedures Procedures    Medications Ordered in ED Medications - No data to display  ED Course/ Medical Decision Making/ A&P                                 Medical Decision Making Amount and/or Complexity of Data Reviewed Labs: ordered. Radiology: ordered.    47 year old female with medical history significant for hypertension, anxiety, obesity, diabetes mellitus who presents to the emergency department with roughly 3 to 4 months of right upper quadrant abdominal pain.  The patient endorses colicky right upper quadrant pain with mild associated nausea that has been bothering her for the past few months.  Pain flared up today prompting her presentation to the emergency department.  She denies any vomiting, she is passing gas and moving her bowels.  Last bowel movement was this  morning.  She denies any fevers or chills.  Denies any recent weight loss.  On arrival, the patient was afebrile, vitally stable.  Physical exam revealed right upper quadrant and epigastric tenderness to palpation, no rebound or guarding, negative Murphy sign.  Differential diagnosis includes peptic ulcer disease, cholelithiasis, less likely cholecystitis, consider pancreatic mass/malignancy.  Lower concern for other acute intra-abdominal abnormality.  Initial laboratory evaluation revealed hCG negative, CMP without significant biliary dysfunction, normal liver and renal function, lipase mildly elevated at 71, CBC without a leukocytosis, mild anemia to 11.6.  Urinalysis without evidence  of UTI.  Right upper quadrant ultrasound was ordered and pending. Plan at time of signout to follow-up US results, reassess the patient, consider CT imaging to evaluate for intraabdominal mass vs discharge with plan for outpatient PCP and or GI follow-up. Signout given to Dr. Durwin Nora at [phone].   Final Clinical Impression(s) / ED Diagnoses Final diagnoses:  RUQ pain    Rx / DC Orders ED Discharge Orders     None         Ernie Avena, MD 12/05/22 1628

## 2022-12-05 NOTE — ED Notes (Signed)
Pt medicated per [facility] for MRI, pt is note to have ready bed, will call report

## 2022-12-05 NOTE — ED Provider Notes (Signed)
Care of patient assumed from Dr. Florentina Addison.  This patient presents with several months of right upper quadrant abdominal pain.  Currently awaiting imaging studies. Physical Exam  BP 125/78 (BP Location: Right Arm)   Pulse 73   Temp (!) 97.5 F (36.4 C) (Oral)   Resp 18   Ht 5\' 5"  (1.651 m)   Wt 92.1 kg   SpO2 100%   BMI 33.78 kg/m   Physical Exam Vitals and nursing note reviewed.  Constitutional:      General: She is not in acute distress.    Appearance: She is well-developed. She is not ill-appearing, toxic-appearing or diaphoretic.  HENT:     Head: Normocephalic and atraumatic.     Mouth/Throat:     Mouth: Mucous membranes are moist.  Eyes:     General: No scleral icterus.    Extraocular Movements: Extraocular movements intact.     Conjunctiva/sclera: Conjunctivae normal.  Cardiovascular:     Rate and Rhythm: Normal rate and regular rhythm.  Pulmonary:     Effort: Pulmonary effort is normal. No respiratory distress.  Abdominal:     Palpations: Abdomen is soft.     Tenderness: There is abdominal tenderness in the right upper quadrant and epigastric area. There is no guarding or rebound.  Musculoskeletal:        General: No swelling.     Cervical back: Neck supple.  Skin:    General: Skin is warm and dry.     Coloration: Skin is not cyanotic, jaundiced or pale.  Neurological:     General: No focal deficit present.     Mental Status: She is alert and oriented to person, place, and time.  Psychiatric:        Mood and Affect: Mood normal.        Behavior: Behavior normal.     Procedures  Procedures  ED Course / MDM    Medical Decision Making Amount and/or Complexity of Data Reviewed Labs: ordered. Radiology: ordered.  Risk Prescription drug management.   On assessment, patient appears to be resting comfortably.  She does notice ongoing right upper quadrant pain.  She describes the pain over the past couple months as constant but waxing and waning in severity.   When severity worsens, it will radiate across her upper abdomen.  She has not had nausea or vomiting.  She does not feel like pain is worsened postprandially.  Fentanyl and Toradol were ordered for analgesia.  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  There was a mildly dilated common bile duct with what appears to be a cut off near the pancreatic head.  I discussed with gastroenterologist, Dr. Ewing Schlein, who agrees with MRI/MRCP.  GI team will see in consult.  Patient was informed of CT imaging results.  She was agreeable to admission.  Zosyn was ordered for possible colitis.  Patient was admitted for further management.       Gloris Manchester, MD 12/05/22 [phone]

## 2022-12-06 ENCOUNTER — Observation Stay (HOSPITAL_COMMUNITY): Payer: 59

## 2022-12-06 DIAGNOSIS — R1011 Right upper quadrant pain: Secondary | ICD-10-CM | POA: Diagnosis not present

## 2022-12-06 DIAGNOSIS — R1010 Upper abdominal pain, unspecified: Secondary | ICD-10-CM | POA: Diagnosis not present

## 2022-12-06 DIAGNOSIS — K862 Cyst of pancreas: Secondary | ICD-10-CM | POA: Diagnosis not present

## 2022-12-06 DIAGNOSIS — K838 Other specified diseases of biliary tract: Secondary | ICD-10-CM | POA: Diagnosis not present

## 2022-12-06 DIAGNOSIS — R590 Localized enlarged lymph nodes: Secondary | ICD-10-CM | POA: Diagnosis not present

## 2022-12-06 DIAGNOSIS — N2889 Other specified disorders of kidney and ureter: Secondary | ICD-10-CM | POA: Diagnosis not present

## 2022-12-06 LAB — CBC
HCT: 34.4 % — ABNORMAL LOW (ref 36.0–46.0)
Hemoglobin: 10.6 g/dL — ABNORMAL LOW (ref 12.0–15.0)
MCH: 25.4 pg — ABNORMAL LOW (ref 26.0–34.0)
MCHC: 30.8 g/dL (ref 30.0–36.0)
MCV: 82.5 fL (ref 80.0–100.0)
Platelets: 378 10*3/uL (ref 150–400)
RBC: 4.17 MIL/uL (ref 3.87–5.11)
RDW: 14.6 % (ref 11.5–15.5)
WBC: 5.9 10*3/uL (ref 4.0–10.5)
nRBC: 0 % (ref 0.0–0.2)

## 2022-12-06 LAB — BASIC METABOLIC PANEL
Anion gap: 12 (ref 5–15)
BUN: 18 mg/dL (ref 6–20)
CO2: 18 mmol/L — ABNORMAL LOW (ref 22–32)
Calcium: 9.2 mg/dL (ref 8.9–10.3)
Chloride: 106 mmol/L (ref 98–111)
Creatinine, Ser: 0.97 mg/dL (ref 0.44–1.00)
GFR, Estimated: >60 mL/min (ref >60)
Glucose, Bld: 109 mg/dL — ABNORMAL HIGH (ref 70–99)
Potassium: 3.9 mmol/L (ref 3.5–5.1)
Sodium: 136 mmol/L (ref 135–145)

## 2022-12-06 LAB — HEPATIC FUNCTION PANEL
ALT: 17 U/L (ref 0–44)
AST: 21 U/L (ref 15–41)
Albumin: 3.1 g/dL — ABNORMAL LOW (ref 3.5–5.0)
Alkaline Phosphatase: 69 U/L (ref 38–126)
Bilirubin, Direct: 0.1 mg/dL (ref 0.0–0.2)
Indirect Bilirubin: 0.4 mg/dL (ref 0.3–0.9)
Total Bilirubin: 0.5 mg/dL (ref 0.3–1.2)
Total Protein: 6.8 g/dL (ref 6.5–8.1)

## 2022-12-06 LAB — HIV ANTIBODY (ROUTINE TESTING W REFLEX): HIV Screen 4th Generation wRfx: NONREACTIVE

## 2022-12-06 LAB — PHOSPHORUS: Phosphorus: 5 mg/dL — ABNORMAL HIGH (ref 2.5–4.6)

## 2022-12-06 LAB — MAGNESIUM: Magnesium: 1.8 mg/dL (ref 1.7–2.4)

## 2022-12-06 MED ORDER — NYSTATIN 100000 UNIT/GM EX CREA
TOPICAL_CREAM | Freq: Three times a day (TID) | CUTANEOUS | Status: DC
  Filled 2022-12-06: qty 30

## 2022-12-06 MED ORDER — PIPERACILLIN-TAZOBACTAM 3.375 G IVPB
3.3750 g | Freq: Three times a day (TID) | INTRAVENOUS | Status: DC
  Administered 2022-12-06 – 2022-12-07 (×5): 3.375 g via INTRAVENOUS
  Filled 2022-12-06 (×5): qty 50

## 2022-12-06 NOTE — Progress Notes (Signed)
PROGRESS NOTE  Kaylee Maxwell WUJ:[id number] DOB: Jul 06, 1975 DOA: 12/05/2022 PCP: Donell Beers, FNP   LOS: 0 days   Brief Narrative / Interim history: 47 year old female with history of HTN, DM 2, who comes into the hospital with several months of persistent, progressive right upper quadrant abdominal pain.  She reports the pain is relatively constant throughout the day, not necessarily exacerbated by food.  Few days ago pain became severe and decided to come to the emergency room.  She has been trying to get established with GI as an outpatient however it would be in February of next year by the time she would have gotten an appointment and could not wait anymore due to pain.  No nausea or vomiting.  Reports chronic diarrhea unchanged in quality  Subjective / 24h Interval events: Complains of dull abdominal pain in the right upper quadrant  Assesement and Plan: Principal problem Right upper quadrant abdominal pain -imaging with ultrasound, CT, MRCP showed CBD dilation but no clear-cut choledocholithiasis, query distal lesion.  LFTs are normal.  Lipase mildly elevated at 71. -Gastroenterology consulted, appreciate input.  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  I have called urology appointment line and set up patient to see Dr. Heide Scales on December 12, 2022 at 11 AM.  AVS updated to reflect appointment  Question of diverticulitis -on the CT scan.  She has no white count.  Does have some tenderness on the left lower quadrant and was started on Zosyn on admission.  GI to see  Obesity, class I-BMI 33.7.  She would benefit from weight loss  Essential hypertension-continue home medications  Hyperlipidemia-continue statin   Scheduled Meds:  atorvastatin  10 mg Oral Daily   fentaNYL  (SUBLIMAZE) injection  50 mcg Intravenous Once   metoprolol succinate  50 mg Oral Daily   spironolactone  25 mg Oral Daily   Continuous Infusions:  piperacillin-tazobactam (ZOSYN)  IV 3.375 g (12/06/22 0827)   PRN Meds:.acetaminophen, oxyCODONE, traZODone  Current Outpatient Medications  Medication Instructions   allopurinol (ZYLOPRIM) 100 mg, Oral, Daily   atorvastatin (LIPITOR) 10 mg, Oral, Daily   Blood Glucose Monitoring Suppl (FREESTYLE LITE) w/Device KIT Use as directed   glucose blood (GNP TRUE METRIX GLUCOSE STRIPS) test strip Use as instructed   Lancets (FREESTYLE) lancets Use as directed   metoprolol succinate (TOPROL-XL) 50 mg, Oral, Daily, Take with or immediately following a meal.   Ozempic (0.25 or 0.5 MG/DOSE) 0.5 mg, Subcutaneous, Weekly   spironolactone (ALDACTONE) 25 mg, Oral, Daily   traZODone (DESYREL) 50 MG tablet Take 1 tablet (50 mg total) by mouth at bedtime as needed for sleep   valsartan (DIOVAN) 80 mg, Oral, Daily    Diet Orders (From admission, onward)     Start     Ordered   12/06/22 0001  Diet NPO time specified  Diet effective midnight        12/05/22 1948            DVT prophylaxis: SCDs Start: 12/05/22 1947   Lab Results  Component Value Date   PLT 378 12/06/2022      Code Status: Full Code  Family Communication: no family at bedside  Status is: Observation The patient will require care spanning > 2 midnights and should be moved to inpatient because:  GI workup   Level of care: Med-Surg  Consultants:  GI  Objective: Vitals:   12/05/22 2137 12/06/22 0445 12/06/22 0824 12/06/22 0910  BP: 133/85 107/62 121/68 118/68  Pulse: 84 100 87 92  Resp: 18 18  15   Temp: 98.3 F (36.8 C) 98.2 F (36.8 C)  98.3 F (36.8 C)  TempSrc:      SpO2: 100% 99%  100%  Weight:      Height:        Intake/Output Summary (Last 24 hours) at 12/06/2022 1105 Last data filed at 12/06/2022 0445 Gross per 24 hour  Intake 22.17 ml  Output 0 ml   Net 22.17 ml   Wt Readings from Last 3 Encounters:  12/05/22 92.1 kg  10/28/22 93.7 kg  05/24/22 93.4 kg    Examination:  Constitutional: NAD Eyes: no scleral icterus ENMT: Mucous membranes are moist.  Neck: normal, supple Respiratory: clear to auscultation bilaterally, no wheezing, no crackles. Normal respiratory effort.  Cardiovascular: Regular rate and rhythm, no murmurs / rubs / gallops. No LE edema.  Abdomen: Tender bilateral lower quadrants, no guarding or rebound.  Bowel sounds positive.  Musculoskeletal: no clubbing / cyanosis.  Skin: no rashes Neurologic: non focal   Data Reviewed: I have independently reviewed following labs and imaging studies   CBC Recent Labs  Lab 12/05/22 1139 12/06/22 0815  WBC 6.4 5.9  HGB 11.6* 10.6*  HCT 37.1 34.4*  PLT 382 378  MCV 85.1 82.5  MCH 26.6 25.4*  MCHC 31.3 30.8  RDW 14.4 14.6    Recent Labs  Lab 12/05/22 1139 12/06/22 0440  NA 137 136  K 3.7 3.9  CL 106 106  CO2 23 18*  GLUCOSE 114* 109*  BUN 15 18  CREATININE 0.74 0.97  CALCIUM 9.5 9.2  AST 19 21  ALT 19 17  ALKPHOS 73 69  BILITOT 0.9 0.5  ALBUMIN 3.9 3.1*  MG  --  1.8    ------------------------------------------------------------------------------------------------------------------ No results for input(s): "CHOL", "HDL", "LDLCALC", "TRIG", "CHOLHDL", "LDLDIRECT" in the last 72 hours.  Lab Results  Component Value Date   HGBA1C 6.3 10/28/2022   ------------------------------------------------------------------------------------------------------------------ No results for input(s): "TSH", "T4TOTAL", "T3FREE", "THYROIDAB" in the last 72 hours.  Invalid input(s): "FREET3"  Cardiac Enzymes No results for input(s): "CKMB", "TROPONINI", "MYOGLOBIN" in the last 168 hours.  Invalid input(s): "CK" ------------------------------------------------------------------------------------------------------------------ No results found for:  "BNP"  CBG: No results for input(s): "GLUCAP" in the last 168 hours.  No results found for this or any previous visit (from the past 240 hour(s)).   Radiology Studies: MR ABDOMEN MRCP W WO CONTAST  Result Date: 12/06/2022 CLINICAL DATA:  47 year old female with history of right upper quadrant abdominal pain. Suspected biliary disease. Dilated common bile duct noted on prior CT examination. EXAM: MRI ABDOMEN WITHOUT AND WITH CONTRAST (INCLUDING MRCP) TECHNIQUE: Multiplanar multisequence MR imaging of the abdomen was performed both before and after the administration of intravenous contrast. Heavily T2-weighted images of the biliary and pancreatic ducts were obtained, and three-dimensional MRCP images were rendered by post processing. CONTRAST:  10mL GADAVIST GADOBUTROL 1 MMOL/ML IV SOLN COMPARISON:  None Available. FINDINGS: Lower chest: Unremarkable. Hepatobiliary: In segment 8 of the liver (axial image 6 of series 4 and coronal image 18 of series 3) there is a 1.8 x 1.4 x 1.8 cm T1 hypointense, T2 hyperintense lesion which has several thin internal septations which demonstrate low-level enhancement. This is favored to represent a minimally complicated cyst, and has no definite  solid internal enhancement or mural nodularity. No other suspicious hepatic lesions are noted. Gallbladder is moderately distended. No filling defects in the gallbladder to suggest gallstones. Gallbladder wall thickness is normal. No pericholecystic fluid. No intrahepatic biliary ductal dilatation. Common bile duct is slightly dilated measuring 9 mm in the porta hepatis. However, there are no filling defects within the common bile duct to suggest choledocholithiasis. Pancreas: In the pancreatic head immediately adjacent to the ampulla (axial image 55 of series 13) there is a 6 mm T1 hypointense and T2 hyperintense lesion which demonstrates no definite enhancement on post gadolinium imaging, favored to represent a tiny side branch  IPMN (intraductal papillary mucinous neoplasm) or tiny pancreatic pseudocysts. No other suspicious appearing pancreatic lesions are identified. No pancreatic ductal dilatation noted on MRCP images. No peripancreatic fluid collections or inflammatory changes. Spleen:  Unremarkable. 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 This lesion appears encapsulated within Gerota's fascia and is well separated from the left renal vein which is widely patent at this time. Right kidney and bilateral adrenal glands are otherwise normal in appearance. No hydroureteronephrosis in the visualized portions of the abdomen. Stomach/Bowel: Scattered colonic diverticuli are noted. Otherwise, unremarkable. Vascular/Lymphatic: No aneurysm identified in the visualized abdominal vasculature. Retroaortic left renal vein (normal anatomical variant) incidentally noted. No lymphadenopathy noted in the abdomen. Other: No significant volume of ascites noted in the visualized portions of the peritoneal cavity. Musculoskeletal: No aggressive appearing osseous lesions are noted in the visualized portions of the skeleton. IMPRESSION: 1. Although there is mild dilatation of the common bile duct (9 mm), no choledocholithiasis or definite source of obstruction is noted. 2. There is a tiny 6 mm lesion in the pancreatic head adjacent to the ampulla, favored to represent a benign lesions such as a tiny side branch IPMN (intraductal papillary mucinous neoplasm) or pseudocyst. This is unlikely to be a cause of obstruction. However, repeat abdominal MRI with and without IV gadolinium with MRCP is recommended in 1 year to ensure the stability or  regression of this finding. This recommendation follows ACR consensus guidelines: Management of Incidental Pancreatic Cysts: A White Paper of the ACR Incidental Findings Committee. J Am Coll Radiol 2017;14:911-923. 3. 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 Referral to Urology for further clinical evaluation and follow-up management is recommended. 4. Mildly complex cyst in segment 8 of the liver, favored to be benign. Attention at time of repeat abdominal MRI with and without IV gadolinium is recommended to ensure stability. 5. Colonic diverticulosis. Electronically Signed   By: Trudie Reed M.D.   On: 12/06/2022 07:14   MR 3D Recon At Scanner  Result Date: 12/06/2022 CLINICAL DATA:  47 year old female with history of right upper quadrant abdominal pain. Suspected biliary disease. Dilated common bile duct noted on prior CT examination. EXAM: MRI ABDOMEN WITHOUT AND WITH CONTRAST (INCLUDING MRCP) TECHNIQUE: Multiplanar multisequence MR imaging of the abdomen was performed both before and after the administration of intravenous contrast. Heavily T2-weighted images of the biliary and pancreatic ducts were obtained, and three-dimensional MRCP images were rendered by post processing. CONTRAST:  10mL GADAVIST GADOBUTROL  1 MMOL/ML IV SOLN COMPARISON:  None Available. FINDINGS: Lower chest: Unremarkable. Hepatobiliary: In segment 8 of the liver (axial image 6 of series 4 and coronal image 18 of series 3) there is a 1.8 x 1.4 x 1.8 cm T1 hypointense, T2 hyperintense lesion which has several thin internal septations which demonstrate low-level enhancement. This is favored to represent a minimally complicated cyst, and has no definite solid internal enhancement or mural nodularity. No other suspicious hepatic lesions are noted. Gallbladder is moderately distended. No filling defects in the gallbladder to suggest  gallstones. Gallbladder wall thickness is normal. No pericholecystic fluid. No intrahepatic biliary ductal dilatation. Common bile duct is slightly dilated measuring 9 mm in the porta hepatis. However, there are no filling defects within the common bile duct to suggest choledocholithiasis. Pancreas: In the pancreatic head immediately adjacent to the ampulla (axial image 55 of series 13) there is a 6 mm T1 hypointense and T2 hyperintense lesion which demonstrates no definite enhancement on post gadolinium imaging, favored to represent a tiny side branch IPMN (intraductal papillary mucinous neoplasm) or tiny pancreatic pseudocysts. No other suspicious appearing pancreatic lesions are identified. No pancreatic ductal dilatation noted on MRCP images. No peripancreatic fluid collections or inflammatory changes. Spleen:  Unremarkable. 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 This lesion appears encapsulated within Gerota's fascia and is well separated from the left renal vein which is widely patent at this time. Right kidney and bilateral adrenal glands are otherwise normal in appearance. No hydroureteronephrosis in the visualized portions of the abdomen. Stomach/Bowel: Scattered colonic diverticuli are noted. Otherwise, unremarkable. Vascular/Lymphatic: No aneurysm identified in the visualized abdominal vasculature. Retroaortic left renal vein (normal anatomical variant) incidentally noted. No lymphadenopathy noted in the abdomen. Other: No significant volume of ascites noted in the visualized portions of the peritoneal cavity. Musculoskeletal: No aggressive appearing osseous lesions are noted in the  visualized portions of the skeleton. IMPRESSION: 1. Although there is mild dilatation of the common bile duct (9 mm), no choledocholithiasis or definite source of obstruction is noted. 2. There is a tiny 6 mm lesion in the pancreatic head adjacent to the ampulla, favored to represent a benign lesions such as a tiny side branch IPMN (intraductal papillary mucinous neoplasm) or pseudocyst. This is unlikely to be a cause of obstruction. However, repeat abdominal MRI with and without IV gadolinium with MRCP is recommended in 1 year to ensure the stability or regression of this finding. This recommendation follows ACR consensus guidelines: Management of Incidental Pancreatic Cysts: A White Paper of the ACR Incidental Findings Committee. J Am Coll Radiol 2017;14:911-923. 3. 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 Referral to Urology for further clinical evaluation and follow-up management is recommended. 4. Mildly complex cyst in segment 8 of the liver, favored to be benign. Attention at time of repeat abdominal MRI with and without IV gadolinium is recommended to ensure stability. 5. Colonic diverticulosis. Electronically Signed   By: Trudie Reed M.D.   On: 12/06/2022 07:14   CT ABDOMEN PELVIS W CONTRAST  Result Date: 12/05/2022 CLINICAL DATA:  Abdominal pain for several months. EXAM:  CT ABDOMEN AND PELVIS WITH CONTRAST TECHNIQUE: Multidetector CT imaging of the abdomen and pelvis was performed using the standard protocol following bolus administration of intravenous contrast. RADIATION DOSE REDUCTION: This exam was performed according to the departmental dose-optimization program which includes automated exposure control, adjustment of the mA and/or kV according to patient size and/or use of iterative reconstruction technique. CONTRAST:  OMNIPAQUE IOHEXOL 300 MG/ML  SOLN COMPARISON:  None Available.  FINDINGS: Lower chest: There is some linear opacity lung bases likely scar or atelectasis. No pleural effusion. Hepatobiliary: Patent portal vein. Benign-appearing segment 8 hepatic cyst measuring 19 mm. Few thin septations. Few other tiny low-attenuation foci in the liver seen which are too small to completely characterize although again likely benign cystic foci. Gallbladder is mildly distended. There is a dilated common duct with what appears to be an abrupt cutoff at the ampulla. Stone or other lesion is possible. No significant intrahepatic biliary ductal dilatation. Recommend further evaluation. Pancreas: Unremarkable. No pancreatic ductal dilatation or surrounding inflammatory changes. Spleen: Normal in size without focal abnormality. Adrenals/Urinary Tract: The adrenal glands are preserved. No right-sided enhancing renal mass. No bilateral renal collecting system dilatation. The ureters have normal course and caliber extending down to the urinary bladder. There is a mass lesion along the lower aspect of the left kidney extending posteriorly measuring 3.1 by 2.8 cm with Hounsfield units of 126. Based on appearance this has worrisome for a renal cell carcinoma or neoplasm. Please correlate with any prior recommend dedicated imaging. Preserved contours of the urinary bladder. Stomach/Bowel: On this non oral contrast exam, the large bowel has a normal course and caliber with scattered stool. Normal appendix. There is left-sided colonic diverticula. In the sigmoid region there is wall thickening with stranding and vascular engorgement consistent with a area of diverticulitis. No complicating findings of free intra-abdominal air, rim enhancing fluid collection or obstruction. The stomach and small bowel are nondilated. Vascular/Lymphatic: Retroaortic left renal vein. Normal caliber aorta and IVC. Mild scattered atherosclerotic calcifications. No specific abnormal lymph node enlargement identified in the abdomen  and pelvis. Reproductive: Uterus is present. Multiple fibroids. No separate adnexal mass. Other: No free air or free fluid. Musculoskeletal: Scattered degenerative changes of the spine and pelvis. 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 Please correlate for known history. IMPRESSION: Wall thickening with stranding and diverticula along the sigmoid colon. Please correlate for colitis or diverticulitis. Recommend follow up after treatment to confirm clearance to exclude secondary pathology. No obstruction, abscess formation or free air. High density left-sided renal mass worrisome for enhancing lesion and possible renal cell carcinoma. Please correlate with prior or dedicated workup with the precontrast dataset with CT when appropriate or a pre and postcontrast MRI. Mildly dilated common bile duct with cut off appearance of the ampulla, pancreatic head. Underlying lesion is not excluded. Recommend further evaluation with MRI/MRCP when appropriate Electronically Signed   By: Karen Kays M.D.   On: 12/05/2022 17:42   US Abdomen Limited RUQ (LIVER/GB)  Result Date: 12/05/2022 CLINICAL DATA:  Right upper quadrant pain EXAM: ULTRASOUND ABDOMEN LIMITED RIGHT UPPER QUADRANT COMPARISON:  None Available. FINDINGS: Gallbladder: No gallstones or wall thickening visualized. No sonographic Murphy sign noted by sonographer. Common bile duct: Diameter: 9 mm Liver: No focal lesion identified. Within normal limits in parenchymal echogenicity. Benign hepatic cysts identified with some septations and through transmission in the right hepatic lobe measuring 18 x 18 x 18 mm.  Portal vein is patent on color Doppler imaging with normal direction of blood flow towards the liver. Other: None. IMPRESSION: Dilated common duct. Recommend further workup such as MRI/MRCP when clinically appropriate. No gallstones. Electronically Signed   By: Karen Kays M.D.   On:  12/05/2022 17:35     Pamella Pert, MD, PhD [facility]  Between 7 am - 7 pm I am available, please contact me via Amion (for emergencies) or Securechat (non urgent messages)  Between 7 pm - 7 am I am not available, please contact night coverage MD/APP via Amion

## 2022-12-06 NOTE — TOC CM/SW Note (Signed)
Transition of Care [facility]) - Inpatient Brief Assessment   Patient Details  Name: Kaylee Maxwell MRN: [id number] Date of Birth: 28-Jan-1976  Transition of Care [facility]) CM/SW Contact:    Tom-Johnson, Hershal Coria, RN Phone Number: 12/06/2022, 2:31 PM   Clinical Narrative:  Patient presented to the ED with progressive Rt Upper Quadrant pain. Admitted with Mild Dilated Common Bile Duct. Found to have Lt Kidney Mass, Urology, GI consult.   From home with two sons, immediate family lives in [place]. Employed and independent with all cares.  PCP is Paseda, Baird Kay, FNP and uses [facility].  No TOC needs or recommendations noted at this time.  Patient not Medically ready for discharge.  CM will continue to follow as patient progresses with care towards discharge.       Transition of Care Asessment: Insurance and Status: Insurance coverage has been reviewed Patient has primary care physician: Yes Home environment has been reviewed: Yes Prior level of function:: Independent Prior/Current Home Services: No current home services Social Determinants of Health Reivew: SDOH reviewed no interventions necessary Readmission risk has been reviewed: Yes Transition of care needs: no transition of care needs at this time

## 2022-12-06 NOTE — Discharge Instructions (Signed)
You have a urology appointment on 11//24 at 11 AM with Dr. Heide Scales

## 2022-12-06 NOTE — Plan of Care (Signed)

## 2022-12-06 NOTE — Progress Notes (Signed)
Pharmacy Antibiotic Note  Kaylee Maxwell is a 47 y.o. female admitted on 12/05/2022 with concern for diverticulitis.  Pharmacy has been consulted for Zosyn dosing.  Plan: Zosyn 3.375g IV q8h (4 hour infusion).  Height: 5\' 5"  (165.1 cm) Weight: 92.1 kg (203 lb) IBW/kg (Calculated) : 57  Temp (24hrs), Avg:97.9 F (36.6 C), Min:97.5 F (36.4 C), Max:98.4 F (36.9 C)  Recent Labs  Lab 12/05/22 1139  WBC 6.4  CREATININE 0.74    Estimated Creatinine Clearance: 97.4 mL/min (by C-G formula based on SCr of 0.74 mg/dL).    No Known Allergies   Thank you for allowing pharmacy to be a part of this patient's care.  Vernard Gambles, PharmD, BCPS  12/06/2022 12:10 AM

## 2022-12-06 NOTE — Consult Note (Signed)
Consultation  Referring Provider: [facility]/ [facility] Primary Care Physician:  Donell Beers, FNP Primary Gastroenterologist: Gentry Fitz  Reason for Consultation: Abdominal pain, abnormal imaging  HPI: Kaylee Maxwell is a 47 y.o. female with history of adult onset diabetes mellitus, PVCs, hypertension who presented to the emergency room and was admitted yesterday with complaints of abdominal pain.  She says she has been having pain for about 3 months but has had gradual worsening to the point that it is constant and now 9 out of 10 at its worst.  She points to pain across her abdomen, and more in the right abdomen.  No radiation of pain, no associated fever or chills, no exacerbating features postprandially, no nausea or vomiting.  She does relate loose stools, no melena or hematochezia.  Not had any prior GI evaluation  ultrasound yesterday with normal-appearing gallbladder, CBD of 9 mm, normal-appearing liver other than benign hepatic cysts 1 of which measured 18 x 18 mm Subsequent CT of the abdomen pelvis with contrast showed a benign 19 mm hepatic cyst, few other tiny hepatic cysts, dilated common bile duct which appears to be an abrupt cut off at the ampulla no significant intrahepatic biliary ductal dilation, unremarkable pancreas, there is mass along the lower aspect of the left kidney measuring 3.1 x 2.8 cm, worrisome for renal cell carcinoma Left colon diverticuli, in the sigmoid there is some wall thickening and stranding consistent with an area of diverticulitis, no complicating features multiple uterine fibroids  MRI/MRCP was completed today showing a moderately distended gallbladder but no gallstones, normal wall thickness no pericholecystic fluid and no intrahepatic biliary ductal dilation, CBD of 9 mm in the porta hepatis no filling defects to suggest choledocholithiasis, in the pancreatic head immediately adjacent to the ampulla there is a 6 mm hypointense/T2 hyperintense lesion  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  This returned showing no evidence of malignancy but positive for nonnecrotizing granulomas-consider infection, sarcoidosis. Patient says that she was told the biopsy was negative for malignancy but has not had any other workup since and says the enlarged lymph node is still present.  She has no prior history of known sarcoid.  No recent chest imaging     Past Medical History:  Diagnosis Date   Anxiety 03/04/2018   Breast cancer screening 03/04/2018   Heart palpitations 03/04/2018   Monitor 9/21: normal sinus rhythm, sinus tachycardia, Avg HR 110; occ PVCs   Hypertension    Irregular heartbeat 03/04/2018   Mild mitral regurgitation    Obesity    PVC's (premature ventricular contractions)    Scalp mass 10/09/2012   Sinus tachycardia    Type 2 diabetes mellitus (HCC)     Past Surgical History:  Procedure Laterality Date   ANKLE FRACTURE SURGERY     cyst removal from scalp     left leg bone removal      Prior to Admission medications   Medication Sig Start Date End Date Taking? Authorizing Provider  atorvastatin (LIPITOR) 10 MG tablet Take 1 tablet (10 mg total) by mouth daily. 10/31/22  Yes Paseda,  Baird Kay, FNP  metoprolol succinate (TOPROL-XL) 50 MG 24 hr tablet Take 1 tablet (50 mg total) by mouth daily. Take with or immediately following a meal. 06/07/22  Yes Nahser, Deloris Ping, MD  Semaglutide,0.25 or 0.5MG /DOS, (OZEMPIC, 0.25 OR 0.5 MG/DOSE,) 2 MG/3ML SOPN Inject 0.5 mg into the skin once a week. 10/07/22  Yes  Paseda, Baird Kay, FNP  spironolactone (ALDACTONE) 25 MG tablet Take 1 tablet (25 mg total) by mouth daily. 05/10/22  Yes Nahser, Deloris Ping, MD  traZODone (DESYREL) 50 MG tablet Take 1 tablet (50 mg total) by mouth at bedtime as needed for sleep Patient taking differently: Take 50 mg by mouth at bedtime as needed for sleep. 05/24/22  Yes Paseda, Baird Kay, FNP  valsartan (DIOVAN) 80 MG tablet Take 1 tablet (80 mg total) by mouth daily. 05/10/22  Yes Nahser, Deloris Ping, MD  allopurinol (ZYLOPRIM) 100 MG tablet Take 1 tablet (100 mg total) by mouth daily. Patient not taking: Reported on 12/06/2022 05/24/22   Donell Beers, FNP  Blood Glucose Monitoring Suppl (FREESTYLE LITE) w/Device KIT Use as directed Patient not taking: Reported on 10/28/2022 04/19/22   Donell Beers, FNP  glucose blood (GNP TRUE METRIX GLUCOSE STRIPS) test strip Use as instructed Patient not taking: Reported on 05/24/2022 04/19/22   Donell Beers, FNP  Lancets (FREESTYLE) lancets Use as directed Patient not taking: Reported on 05/24/2022 04/19/22   Donell Beers, FNP  cetirizine (ZYRTEC ALLERGY) 10 MG tablet Take 1 tablet (10 mg total) by mouth daily. 03/04/20 04/20/20  Wallis Bamberg, PA-C    Current Facility-Administered Medications  Medication Dose Route Frequency Provider Last Rate Last Admin   acetaminophen (TYLENOL) tablet 1,000 mg  1,000 mg Oral Q6H PRN Segars, Christiane Ha, MD       atorvastatin (LIPITOR) tablet 10 mg  10 mg Oral Daily Segars, Christiane Ha, MD   10 mg at 12/06/22 8295   fentaNYL (SUBLIMAZE) injection 50 mcg  50 mcg Intravenous Once Gloris Manchester, MD       metoprolol succinate (TOPROL-XL) 24 hr tablet 50 mg  50 mg Oral Daily Segars, Christiane Ha, MD   50 mg at 12/06/22 6213   oxyCODONE (Oxy IR/ROXICODONE) immediate release tablet 2.5 mg  2.5 mg Oral Q6H PRN Dolly Rias, MD   2.5 mg at 12/06/22 0835   piperacillin-tazobactam (ZOSYN) IVPB 3.375 g  3.375 g Intravenous Q8H Juliette Mangle, RPH 12.5 mL/hr  at 12/06/22 0827 3.375 g at 12/06/22 0827   spironolactone (ALDACTONE) tablet 25 mg  25 mg Oral Daily Dolly Rias, MD   25 mg at 12/06/22 0865   traZODone (DESYREL) tablet 50 mg  50 mg Oral QHS PRN Dolly Rias, MD        Allergies as of 12/05/2022   (No Known Allergies)    Family History  Problem Relation Age of Onset   Diabetes Mother    Hypertension Mother    Obesity Mother    Hypertension Father    Cancer Paternal Aunt        pancreatic   Cancer Maternal Grandmother        lung   Autism spectrum disorder Sister     Social History   Socioeconomic History   Marital status: Single    Spouse name: Not on file   Number of children: 2   Years of education: Not on file   Highest education level: Associate degree: academic program  Occupational History   Not on file  Tobacco Use  Smoking status: Former    Current packs/day: 0.50    Average packs/day: 0.5 packs/day for 18.0 years (9.0 ttl pk-yrs)    Types: Cigarettes   Smokeless tobacco: Never  Vaping Use   Vaping status: Every Day   Substances: Nicotine, Flavoring  Substance and Sexual Activity   Alcohol use: Yes    Comment: occasional   Drug use: No   Sexual activity: Yes    Birth control/protection: None  Other Topics Concern   Not on file  Social History Narrative   Lives with her sons.    Social Determinants of Health   Financial Resource Strain: High Risk (03/21/2018)   Overall Financial Resource Strain (CARDIA)    Difficulty of Paying Living Expenses: Hard  Food Insecurity: No Food Insecurity (03/21/2018)   Hunger Vital Sign    Worried About Running Out of Food in the Last Year: Never true    Ran Out of Food in the Last Year: Never true  Transportation Needs: No Transportation Needs (03/21/2018)   PRAPARE - Administrator, Civil Service (Medical): No    Lack of Transportation (Non-Medical): No  Physical Activity: Inactive (03/21/2018)   Exercise Vital Sign    Days of Exercise per  Week: 0 days    Minutes of Exercise per Session: 0 min  Stress: Stress Concern Present (03/21/2018)   [employer] of Occupational Health - Occupational Stress Questionnaire    Feeling of Stress : Rather much  Social Connections: Moderately Integrated (03/21/2018)   Social Connection and Isolation Panel [NHANES]    Frequency of Communication with Friends and Family: Three times a week    Frequency of Social Gatherings with Friends and Family: Twice a week    Attends Religious Services: More than 4 times per year    Active Member of [employer] or Organizations: Yes    Attends Banker Meetings: Never    Marital Status: Never married  Intimate Partner Violence: At Risk (03/21/2018)   Humiliation, Afraid, Rape, and Kick questionnaire    Fear of Current or Ex-Partner: No    Emotionally Abused: Yes    Physically Abused: No    Sexually Abused: No    Review of Systems: Pertinent positive and negative review of systems were noted in the above HPI section.  All other review of systems was otherwise negative.   Physical Exam: Vital signs in last 24 hours: Temp:  [97.5 F (36.4 C)-98.3 F (36.8 C)] 98.3 F (36.8 C) (10/29 0910) Pulse Rate:  [72-100] 92 (10/29 0910) Resp:  [15-18] 15 (10/29 0910) BP: (107-141)/(62-85) 118/68 (10/29 0910) SpO2:  [99 %-100 %] 100 % (10/29 0910) Last BM Date : 12/06/22 General:   Alert,  Well-developed, well-nourished, A American female pleasant and cooperative in NAD, son at bedside Head:  Normocephalic and atraumatic. Eyes:  Sclera clear, no icterus.   Conjunctiva pink. Ears:  Normal auditory acuity. Nose:  No deformity, discharge,  or lesions. Mouth:  No deformity or lesions.   Neck:  Supple; no masses or thyromegaly. Lungs:  Clear throughout to auscultation.   No wheezes, crackles, or rhonchi.  Heart:  Regular rate and rhythm; no murmurs, clicks, rubs,  or gallops. Abdomen:  Soft,BS active,nonpalp mass or hsm, she does have some nonfocal  tenderness more in the upper abdomen across the upper abdomen, hypogastrium and right upper quadrant, no left lower quadrant or suprapubic tenderness on my exam, Rectal: Not done Msk:  Symmetrical without gross deformities. . Pulses:  Normal pulses  noted. Extremities:  Without clubbing or edema. Neurologic:  Alert and  oriented x4;  grossly normal neurologically. Skin:  Intact without significant lesions or rashes.. Psych:  Alert and cooperative. Normal mood and affect.  Intake/Output from previous day: 10/28 0701 - 10/29 0700 In: 22.2 [IV Piggyback:22.2] Out: 0  Intake/Output this shift: Total I/O In: 220 [P.O.:220] Out: -   Lab Results: Recent Labs    12/05/22 1139 12/06/22 0815  WBC 6.4 5.9  HGB 11.6* 10.6*  HCT 37.1 34.4*  PLT 382 378   BMET Recent Labs    12/05/22 1139 12/06/22 0440  NA 137 136  K 3.7 3.9  CL 106 106  CO2 23 18*  GLUCOSE 114* 109*  BUN 15 18  CREATININE 0.74 0.97  CALCIUM 9.5 9.2   LFT Recent Labs    12/06/22 0440  PROT 6.8  ALBUMIN 3.1*  AST 21  ALT 17  ALKPHOS 69  BILITOT 0.5  BILIDIR 0.1  IBILI 0.4   PT/INR No results for input(s): "LABPROT", "INR" in the last 72 hours. Hepatitis Panel No results for input(s): "HEPBSAG", "HCVAB", "HEPAIGM", "HEPBIGM" in the last 72 hours.    IMPRESSION:  #60` 47 year old African-American female with 33-month history of progressive abdominal pain which is now constant and rated as a 9 out of 10 at its worst.  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  #3 left renal mass worrisome for papillary renal cell carcinoma  #4 hepatic cysts #5 axillary lymph node biopsy May 2024 for adenopathy negative for malignancy but did show granulomas-consider infectious, consider sarcoidosis  #6 diabetes mellitus #7.  Hypertension  PLAN: Will check two-view chest x-ray, ACE level Advance to regular diet We may need to consider EGD prior to discharge to further evaluate the upper abdominal pain. 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 Meridee Score  and we will work to get this scheduled. GI will follow with you   Gracia Saggese PA-C 12/06/2022, 3:24 PM

## 2022-12-07 ENCOUNTER — Telehealth: Payer: Self-pay

## 2022-12-07 ENCOUNTER — Other Ambulatory Visit: Payer: Self-pay

## 2022-12-07 ENCOUNTER — Other Ambulatory Visit (HOSPITAL_COMMUNITY): Payer: Self-pay

## 2022-12-07 DIAGNOSIS — R1011 Right upper quadrant pain: Secondary | ICD-10-CM

## 2022-12-07 DIAGNOSIS — K862 Cyst of pancreas: Secondary | ICD-10-CM

## 2022-12-07 DIAGNOSIS — K838 Other specified diseases of biliary tract: Secondary | ICD-10-CM | POA: Diagnosis not present

## 2022-12-07 DIAGNOSIS — N2889 Other specified disorders of kidney and ureter: Secondary | ICD-10-CM | POA: Diagnosis not present

## 2022-12-07 LAB — CBC
HCT: 32 % — ABNORMAL LOW (ref 36.0–46.0)
Hemoglobin: 10 g/dL — ABNORMAL LOW (ref 12.0–15.0)
MCH: 25.6 pg — ABNORMAL LOW (ref 26.0–34.0)
MCHC: 31.3 g/dL (ref 30.0–36.0)
MCV: 82.1 fL (ref 80.0–100.0)
Platelets: 333 10*3/uL (ref 150–400)
RBC: 3.9 MIL/uL (ref 3.87–5.11)
RDW: 14.6 % (ref 11.5–15.5)
WBC: 4.2 10*3/uL (ref 4.0–10.5)
nRBC: 0 % (ref 0.0–0.2)

## 2022-12-07 LAB — ANGIOTENSIN CONVERTING ENZYME: Angiotensin-Converting Enzyme: 34 U/L (ref 14–82)

## 2022-12-07 LAB — COMPREHENSIVE METABOLIC PANEL
ALT: 17 U/L (ref 0–44)
AST: 14 U/L — ABNORMAL LOW (ref 15–41)
Albumin: 3.1 g/dL — ABNORMAL LOW (ref 3.5–5.0)
Alkaline Phosphatase: 59 U/L (ref 38–126)
Anion gap: 6 (ref 5–15)
BUN: 11 mg/dL (ref 6–20)
CO2: 21 mmol/L — ABNORMAL LOW (ref 22–32)
Calcium: 8.7 mg/dL — ABNORMAL LOW (ref 8.9–10.3)
Chloride: 106 mmol/L (ref 98–111)
Creatinine, Ser: 0.96 mg/dL (ref 0.44–1.00)
GFR, Estimated: >60 mL/min (ref >60)
Glucose, Bld: 109 mg/dL — ABNORMAL HIGH (ref 70–99)
Potassium: 3.4 mmol/L — ABNORMAL LOW (ref 3.5–5.1)
Sodium: 133 mmol/L — ABNORMAL LOW (ref 135–145)
Total Bilirubin: 0.5 mg/dL (ref 0.3–1.2)
Total Protein: 6.9 g/dL (ref 6.5–8.1)

## 2022-12-07 LAB — MAGNESIUM: Magnesium: 1.7 mg/dL (ref 1.7–2.4)

## 2022-12-07 MED ORDER — PANTOPRAZOLE SODIUM 40 MG PO TBEC
40.0000 mg | DELAYED_RELEASE_TABLET | Freq: Every day | ORAL | 2 refills | Status: DC
  Filled 2022-12-07: qty 30, 30d supply, fill #0

## 2022-12-07 MED ORDER — PANTOPRAZOLE SODIUM 40 MG PO TBEC
40.0000 mg | DELAYED_RELEASE_TABLET | Freq: Every day | ORAL | Status: DC
  Administered 2022-12-07: 40 mg via ORAL
  Filled 2022-12-07: qty 1

## 2022-12-07 MED ORDER — POTASSIUM CHLORIDE CRYS ER 20 MEQ PO TBCR
40.0000 meq | EXTENDED_RELEASE_TABLET | Freq: Once | ORAL | Status: AC
Start: 2022-12-07 — End: 2022-12-07
  Administered 2022-12-07: 40 meq via ORAL
  Filled 2022-12-07: qty 2

## 2022-12-07 MED ORDER — TRAMADOL HCL 50 MG PO TABS
50.0000 mg | ORAL_TABLET | Freq: Four times a day (QID) | ORAL | 0 refills | Status: DC | PRN
  Filled 2022-12-07: qty 16, 4d supply, fill #0

## 2022-12-07 NOTE — Progress Notes (Signed)
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  Right sided abdominal pain with dilated bile duct and periampullary cyst --difficult to know if her right-sided abdominal pain is secondary to pancreaticobiliary pathology.  We will arrange outpatient EUS with Dr. Meridee Score.  This will likely be on 02/02/2023 but the office will contact her with details -- Outpatient EUS plus minus ERCP with Dr. Meridee Score -- Pain control, reasonable to use tramadol until pain improves or full evaluation completed  2.  Left kidney mass --she has urology follow-up next Monday on 12/12/2022 for further evaluation and expected surgical management for what is probable RCC by MRI  3.  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  Attention as an outpatient.  For what its worth her ACE level was normal.  Her chest x-ray also did not show any hilar adenopathy.  Sarcoid would remain in the differential.     Chief Complaint   Patient excited about going home Pain is tolerable but still present Tolerating diet  Vital signs in last 24 hours: Temp:  [98 F (36.7 C)-98.6 F (37 C)] 98.1 F (36.7 C) (10/30 0737) Pulse Rate:  [77-96] 77 (10/30 0737) Resp:  [16-18] 16 (10/30 0737) BP: (103-120)/(57-74) 109/61 (10/30 0737) SpO2:  [100 %] 100 % (10/30 0737) Last BM Date : 12/06/22 Gen: awake, alert, NAD HEENT: anicteric  CV: RRR, no mrg Pulm: CTA b/l Abd: soft, tender in the right mid abdomen, obese, not distended, +BS throughout Ext: no c/c/e Neuro: nonfocal  Intake/Output from previous day: 10/29 0701 - 10/30 0700 In: 540  [P.O.:540] Out: 0  Intake/Output this shift: Total I/O In: 220 [P.O.:220] Out: -   Lab Results: Recent Labs    12/05/22 1139 12/06/22 0815 12/07/22 0700  WBC 6.4 5.9 4.2  HGB 11.6* 10.6* 10.0*  HCT 37.1 34.4* 32.0*  PLT 382 378 333   BMET Recent Labs    12/05/22 1139 12/06/22 0440 12/07/22 0700  NA 137 136 133*  K 3.7 3.9 3.4*  CL 106 106 106  CO2 23 18* 21*  GLUCOSE 114* 109* 109*  BUN 15 18 11   CREATININE 0.74 0.97 0.96  CALCIUM 9.5 9.2 8.7*   LFT Recent Labs    12/06/22 0440 12/07/22 0700  PROT 6.8 6.9  ALBUMIN 3.1* 3.1*  AST 21 14*  ALT 17 17  ALKPHOS 69 59  BILITOT 0.5 0.5  BILIDIR 0.1  --   IBILI 0.4  --    PT/INR No results for input(s): "LABPROT", "INR" in the last 72 hours. Hepatitis Panel No results for input(s): "HEPBSAG", "HCVAB", "HEPAIGM", "HEPBIGM" in the last 72 hours.  Studies/Results: DG Chest 2 View  Result Date: 12/06/2022 CLINICAL DATA:  Lymphadenopathy, upper abdominal pain EXAM: CHEST - 2 VIEW COMPARISON:  02/07/2009 FINDINGS: Frontal and lateral views of the chest demonstrate an unremarkable cardiac silhouette. No airspace disease, effusion, or pneumothorax. No acute bony abnormality. IMPRESSION: 1. No acute intrathoracic process. Electronically Signed   By: Sharlet Salina M.D.   On: 12/06/2022 17:20   MR ABDOMEN MRCP W WO CONTAST  Result Date: 12/06/2022 CLINICAL DATA:  47 year old  female with history of right upper quadrant abdominal pain. Suspected biliary disease. Dilated common bile duct noted on prior CT examination. EXAM: MRI ABDOMEN WITHOUT AND WITH CONTRAST (INCLUDING MRCP) TECHNIQUE: Multiplanar multisequence MR imaging of the abdomen was performed both before and after the administration of intravenous contrast. Heavily T2-weighted images of the biliary and pancreatic ducts were obtained, and three-dimensional MRCP images were rendered by post processing. CONTRAST:  10mL GADAVIST GADOBUTROL 1 MMOL/ML IV SOLN COMPARISON:   None Available. FINDINGS: Lower chest: Unremarkable. Hepatobiliary: In segment 8 of the liver (axial image 6 of series 4 and coronal image 18 of series 3) there is a 1.8 x 1.4 x 1.8 cm T1 hypointense, T2 hyperintense lesion which has several thin internal septations which demonstrate low-level enhancement. This is favored to represent a minimally complicated cyst, and has no definite solid internal enhancement or mural nodularity. No other suspicious hepatic lesions are noted. Gallbladder is moderately distended. No filling defects in the gallbladder to suggest gallstones. Gallbladder wall thickness is normal. No pericholecystic fluid. No intrahepatic biliary ductal dilatation. Common bile duct is slightly dilated measuring 9 mm in the porta hepatis. However, there are no filling defects within the common bile duct to suggest choledocholithiasis. Pancreas: In the pancreatic head immediately adjacent to the ampulla (axial image 55 of series 13) there is a 6 mm T1 hypointense and T2 hyperintense lesion which demonstrates no definite enhancement on post gadolinium imaging, favored to represent a tiny side branch IPMN (intraductal papillary mucinous neoplasm) or tiny pancreatic pseudocysts. No other suspicious appearing pancreatic lesions are identified. No pancreatic ductal dilatation noted on MRCP images. No peripancreatic fluid collections or inflammatory changes. Spleen:  Unremarkable. 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 This lesion appears encapsulated within Gerota's fascia and is well separated from the left renal vein which is widely patent at this time. Right kidney and bilateral adrenal glands  are otherwise normal in appearance. No hydroureteronephrosis in the visualized portions of the abdomen. Stomach/Bowel: Scattered colonic diverticuli are noted. Otherwise, unremarkable. Vascular/Lymphatic: No aneurysm identified in the visualized abdominal vasculature. Retroaortic left renal vein (normal anatomical variant) incidentally noted. No lymphadenopathy noted in the abdomen. Other: No significant volume of ascites noted in the visualized portions of the peritoneal cavity. Musculoskeletal: No aggressive appearing osseous lesions are noted in the visualized portions of the skeleton. IMPRESSION: 1. Although there is mild dilatation of the common bile duct (9 mm), no choledocholithiasis or definite source of obstruction is noted. 2. There is a tiny 6 mm lesion in the pancreatic head adjacent to the ampulla, favored to represent a benign lesions such as a tiny side branch IPMN (intraductal papillary mucinous neoplasm) or pseudocyst. This is unlikely to be a cause of obstruction. However, repeat abdominal MRI with and without IV gadolinium with MRCP is recommended in 1 year to ensure the stability or regression of this finding. This recommendation follows ACR consensus guidelines: Management of Incidental Pancreatic Cysts: A White Paper of the ACR Incidental Findings Committee. J Am Coll Radiol 2017;14:911-923. 3. 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 Referral to Urology for further clinical  evaluation and follow-up management is recommended. 4. Mildly complex cyst in segment 8 of the liver, favored to be benign. Attention at time of repeat abdominal MRI with and without IV gadolinium is recommended to ensure stability. 5. Colonic diverticulosis. Electronically Signed   By: Trudie Reed M.D.   On: 12/06/2022 07:14   MR 3D Recon At Scanner  Result Date: 12/06/2022 CLINICAL DATA:  47 year old female  with history of right upper quadrant abdominal pain. Suspected biliary disease. Dilated common bile duct noted on prior CT examination. EXAM: MRI ABDOMEN WITHOUT AND WITH CONTRAST (INCLUDING MRCP) TECHNIQUE: Multiplanar multisequence MR imaging of the abdomen was performed both before and after the administration of intravenous contrast. Heavily T2-weighted images of the biliary and pancreatic ducts were obtained, and three-dimensional MRCP images were rendered by post processing. CONTRAST:  10mL GADAVIST GADOBUTROL 1 MMOL/ML IV SOLN COMPARISON:  None Available. FINDINGS: Lower chest: Unremarkable. Hepatobiliary: In segment 8 of the liver (axial image 6 of series 4 and coronal image 18 of series 3) there is a 1.8 x 1.4 x 1.8 cm T1 hypointense, T2 hyperintense lesion which has several thin internal septations which demonstrate low-level enhancement. This is favored to represent a minimally complicated cyst, and has no definite solid internal enhancement or mural nodularity. No other suspicious hepatic lesions are noted. Gallbladder is moderately distended. No filling defects in the gallbladder to suggest gallstones. Gallbladder wall thickness is normal. No pericholecystic fluid. No intrahepatic biliary ductal dilatation. Common bile duct is slightly dilated measuring 9 mm in the porta hepatis. However, there are no filling defects within the common bile duct to suggest choledocholithiasis. Pancreas: In the pancreatic head immediately adjacent to the ampulla (axial image 55 of series 13) there is a 6 mm T1 hypointense and T2 hyperintense lesion which demonstrates no definite enhancement on post gadolinium imaging, favored to represent a tiny side branch IPMN (intraductal papillary mucinous neoplasm) or tiny pancreatic pseudocysts. No other suspicious appearing pancreatic lesions are identified. No pancreatic ductal dilatation noted on MRCP images. No peripancreatic fluid collections or inflammatory changes. Spleen:   Unremarkable. 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 This lesion appears encapsulated within Gerota's fascia and is well separated from the left renal vein which is widely patent at this time. Right kidney and bilateral adrenal glands are otherwise normal in appearance. No hydroureteronephrosis in the visualized portions of the abdomen. Stomach/Bowel: Scattered colonic diverticuli are noted. Otherwise, unremarkable. Vascular/Lymphatic: No aneurysm identified in the visualized abdominal vasculature. Retroaortic left renal vein (normal anatomical variant) incidentally noted. No lymphadenopathy noted in the abdomen. Other: No significant volume of ascites noted in the visualized portions of the peritoneal cavity. Musculoskeletal: No aggressive appearing osseous lesions are noted in the visualized portions of the skeleton. IMPRESSION: 1. Although there is mild dilatation of the common bile duct (9 mm), no choledocholithiasis or definite source of obstruction is noted. 2. There is a tiny 6 mm lesion in the pancreatic head adjacent to the ampulla, favored to represent a benign lesions such as a tiny side branch IPMN (intraductal papillary mucinous neoplasm) or pseudocyst. This is unlikely to be a cause of obstruction. However, repeat abdominal MRI with and without IV gadolinium with MRCP is recommended  in 1 year to ensure the stability or regression of this finding. This recommendation follows ACR consensus guidelines: Management of Incidental Pancreatic Cysts: A White Paper of the ACR Incidental Findings Committee. J Am Coll Radiol 2017;14:911-923. 3. 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 Referral to Urology for further clinical evaluation and follow-up management is recommended. 4. Mildly complex cyst in segment 8 of the liver, favored to be benign. Attention at time of repeat abdominal MRI with and without IV gadolinium is recommended to ensure stability. 5. Colonic diverticulosis. Electronically Signed   By: Trudie Reed M.D.   On: 12/06/2022 07:14   CT ABDOMEN PELVIS W CONTRAST  Result Date: 12/05/2022 CLINICAL DATA:  Abdominal pain for several months. EXAM: CT ABDOMEN AND PELVIS WITH CONTRAST TECHNIQUE: Multidetector CT imaging of the abdomen and pelvis was performed using the standard protocol following bolus administration of intravenous contrast. RADIATION DOSE REDUCTION: This exam was performed according to the departmental dose-optimization program which includes automated exposure control, adjustment of the mA and/or kV according to patient size and/or use of iterative reconstruction technique. CONTRAST:  OMNIPAQUE IOHEXOL 300 MG/ML  SOLN COMPARISON:  None Available. FINDINGS: Lower chest: There is some linear opacity lung bases likely scar or atelectasis. No pleural effusion. Hepatobiliary: Patent portal vein. Benign-appearing segment 8 hepatic cyst measuring 19 mm. Few thin septations. Few other tiny low-attenuation foci in the liver seen which are too small to completely characterize although again likely benign cystic foci. Gallbladder is mildly distended. There is a dilated common duct with what appears to be an abrupt cutoff at the ampulla. Stone or other lesion is possible. No significant intrahepatic biliary ductal dilatation. Recommend further evaluation. Pancreas: Unremarkable. No pancreatic ductal dilatation or surrounding inflammatory changes. Spleen: Normal in size without focal abnormality. Adrenals/Urinary Tract: The adrenal glands are preserved. No right-sided enhancing  renal mass. No bilateral renal collecting system dilatation. The ureters have normal course and caliber extending down to the urinary bladder. There is a mass lesion along the lower aspect of the left kidney extending posteriorly measuring 3.1 by 2.8 cm with Hounsfield units of 126. Based on appearance this has worrisome for a renal cell carcinoma or neoplasm. Please correlate with any prior recommend dedicated imaging. Preserved contours of the urinary bladder. Stomach/Bowel: On this non oral contrast exam, the large bowel has a normal course and caliber with scattered stool. Normal appendix. There is left-sided colonic diverticula. In the sigmoid region there is wall thickening with stranding and vascular engorgement consistent with a area of diverticulitis. No complicating findings of free intra-abdominal air, rim enhancing fluid collection or obstruction. The stomach and small bowel are nondilated. Vascular/Lymphatic: Retroaortic left renal vein. Normal caliber aorta and IVC. Mild scattered atherosclerotic calcifications. No specific abnormal lymph node enlargement identified in the abdomen and pelvis. Reproductive: Uterus is present. Multiple fibroids. No separate adnexal mass. Other: No free air or free fluid. Musculoskeletal: Scattered degenerative changes of the spine and pelvis. 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 Please correlate for known history. IMPRESSION: Wall thickening with stranding and diverticula along the sigmoid colon. Please correlate for colitis or diverticulitis. Recommend follow up after treatment to confirm clearance to exclude secondary pathology. No obstruction, abscess formation or free air. High density left-sided renal mass worrisome for enhancing lesion and possible renal cell carcinoma. Please  correlate with prior or dedicated workup with the precontrast dataset with CT when appropriate or a pre and  postcontrast MRI. Mildly dilated common bile duct with cut off appearance of the ampulla, pancreatic head. Underlying lesion is not excluded. Recommend further evaluation with MRI/MRCP when appropriate Electronically Signed   By: Karen Kays M.D.   On: 12/05/2022 17:42   US Abdomen Limited RUQ (LIVER/GB)  Result Date: 12/05/2022 CLINICAL DATA:  Right upper quadrant pain EXAM: ULTRASOUND ABDOMEN LIMITED RIGHT UPPER QUADRANT COMPARISON:  None Available. FINDINGS: Gallbladder: No gallstones or wall thickening visualized. No sonographic Murphy sign noted by sonographer. Common bile duct: Diameter: 9 mm Liver: No focal lesion identified. Within normal limits in parenchymal echogenicity. Benign hepatic cysts identified with some septations and through transmission in the right hepatic lobe measuring 18 x 18 x 18 mm. Portal vein is patent on color Doppler imaging with normal direction of blood flow towards the liver. Other: None. IMPRESSION: Dilated common duct. Recommend further workup such as MRI/MRCP when clinically appropriate. No gallstones. Electronically Signed   By: Karen Kays M.D.   On: 12/05/2022 17:35      LOS: 0 days   Beverley Fiedler, MD 12/07/2022, 2:39 PM See Loretha Stapler, [facility], to contact our on call provider

## 2022-12-07 NOTE — Discharge Summary (Signed)
Physician Discharge Summary   Patient: Kaylee Maxwell MRN: [id number] DOB: 1975-02-12  Admit date:     12/05/2022  Discharge date: 12/07/22  Discharge Physician: Kaylee Maxwell   PCP: Kaylee Beers, FNP     Recommendations at discharge:  Follow-up with Kaylee Maxwell, Urology for new diagnosis renal cell carcinoma, incidental finding Follow-up with Kaylee Maxwell, Gastroenterology for common bile duct dilation and for EUS Follow up with PCP Kaylee Maxwell for management of Ozempic, diabetes     Discharge Diagnoses: Principal Problem:   Right upper quadrant pain and common bile duct dilation Active Problems:   Suspected renal cell carcinoma   Obesity   Hypertension   Diabetes      Hospital Course: Kaylee Maxwell is a 47 y.o. F with obesity, HTN, DM who presented with several months progressive persistent right upper quadrant pain.  In the ER, CT showed CBD dilation but no clear choledocholithiasis, and uncertain if there was any distal lesion.  LFTs normal.  Lipase mildly elevated.  GI consulted, MRCP showed small 6 mm likely IPMN.  They recommended outpatient EUS, which has been arranged.  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  Urology were consulted and will plan to evaluate the patient on Monday, November 4.  Possible diverticulitis ruled out.            The [facility] Controlled Substances Registry was reviewed for this patient prior to discharge.  Consultants: Gastroenterology, Kaylee Maxwell Procedures performed:  CT abdomen MRCP  Disposition: Home Diet recommendation:  Regular diet  DISCHARGE MEDICATION: Allergies as of 12/07/2022   No Known Allergies      Medication List     STOP taking these medications    Ozempic (0.25 or 0.5 MG/DOSE) 2 MG/3ML Sopn Generic drug: Semaglutide(0.25 or 0.5MG /DOS)       TAKE these medications    allopurinol 100 MG  tablet Commonly known as: ZYLOPRIM Take 1 tablet (100 mg total) by mouth daily.   atorvastatin 10 MG tablet Commonly known as: LIPITOR Take 1 tablet (10 mg total) by mouth daily.   freestyle lancets Use as directed   FREESTYLE LITE test strip Generic drug: glucose blood Use as instructed   FreeStyle Lite w/Device Kit Use as directed   metoprolol succinate 50 MG 24 hr tablet Commonly known as: TOPROL-XL Take 1 tablet (50 mg total) by mouth daily. Take with or immediately following a meal.   pantoprazole 40 MG tablet Commonly known as: PROTONIX Take 1 tablet (40 mg total) by mouth daily. Start taking on: December 08, 2022   spironolactone 25 MG tablet Commonly known as: ALDACTONE Take 1 tablet (25 mg total) by mouth daily.   traMADol 50 MG tablet Commonly known as: Ultram Take 1 tablet (50 mg total) by mouth every 6 (six) hours as needed for severe pain (pain Maxwell 7-10).   traZODone 50 MG tablet Commonly known as: DESYREL Take 1 tablet (50 mg total) by mouth at bedtime as needed for sleep What changed: reasons to take this   valsartan 80 MG tablet Commonly known as: Diovan Take 1 tablet (80 mg total) by mouth daily.        Follow-up Information     Kaylee Arias, Maxwell Follow up.   Specialty: Urology Why: you have an appointment on 12/12/2022 at 11 am. Please arrive 30 min before, bring your ID and insurance information Contact information: [address]  [place] [address] [phone]         Kaylee Beers, FNP. Schedule an appointment as soon as possible for a visit in 1 week(s).   Specialty: Nurse Practitioner Contact information: [address] [place], [place] [address] [phone]         Kaylee Maxwell, Kaylee Starring., Maxwell Follow up.   Specialties: Gastroenterology, Internal Medicine Contact information: [address] [place] [address] [phone]                 Discharge Instructions     Discharge  instructions   Complete by: As directed    **IMPORTANT DISCHARGE INSTRUCTIONS**   From Dr. Maryfrances Maxwell: You were admitted for right sided abdominal pain.  Your imaging showed two findings, not related.  The first, which we believe is what is causing your pain, is in the digestive system (the small intestine and the ducts connecting the liver and gallbladder to the intestine)  Take a pantoprazole to reduce stomach acid once daily  If you have mild pain, take acetaminophen or use a hot pad  For severe pain, take tramadol 50 mg up to three times daily  Call the GI doctors to arrange your follow up See below next to Dr. Elesa Maxwell name in the "To Do" section  The other thing we saw that needs follow up is the spot on your kidney  This should be followed up with a Urologist asap.   You have an appointment with Kaylee Maxwell from [facility] next Monday. Do not miss this appointment (see below in the To Do section)   Increase activity slowly   Complete by: As directed        Discharge Exam: Filed Weights   12/05/22 1130  Weight: 92.1 kg    General: Pt is alert, awake, not in acute distress Cardiovascular: RRR, nl S1-S2, no murmurs appreciated.   No LE edema.   Respiratory: Normal respiratory rate and rhythm.  CTAB without rales or wheezes. Abdominal: Abdomen soft and non-tender.  No distension or HSM.   Neuro/Psych: Strength symmetric in upper and lower extremities.  Judgment and insight appear normal.   Condition at discharge: good  The results of significant diagnostics from this hospitalization (including imaging, microbiology, ancillary and laboratory) are listed below for reference.   Imaging Studies: DG Chest 2 View  Result Date: 12/06/2022 CLINICAL DATA:  Lymphadenopathy, upper abdominal pain EXAM: CHEST - 2 VIEW COMPARISON:  02/07/2009 FINDINGS: Frontal and lateral views of the chest demonstrate an unremarkable cardiac silhouette. No airspace disease, effusion,  or pneumothorax. No acute bony abnormality. IMPRESSION: 1. No acute intrathoracic process. Electronically Signed   By: Sharlet Salina M.D.   On: 12/06/2022 17:20   MR ABDOMEN MRCP W WO CONTAST  Result Date: 12/06/2022 CLINICAL DATA:  47 year old female with history of right upper quadrant abdominal pain. Suspected biliary disease. Dilated common bile duct noted on prior CT examination. EXAM: MRI ABDOMEN WITHOUT AND WITH CONTRAST (INCLUDING MRCP) TECHNIQUE: Multiplanar multisequence MR imaging of the abdomen was performed both before and after the administration of intravenous contrast. Heavily T2-weighted images of the biliary and pancreatic ducts were obtained, and three-dimensional MRCP images were rendered by post processing. CONTRAST:  10mL GADAVIST GADOBUTROL 1 MMOL/ML IV SOLN COMPARISON:  None Available. FINDINGS: Lower chest: Unremarkable. Hepatobiliary: In segment 8 of the liver (axial image 6 of series 4 and coronal image 18 of series 3) there is a 1.8 x 1.4 x 1.8 cm T1 hypointense, T2  hyperintense lesion which has several thin internal septations which demonstrate low-level enhancement. This is favored to represent a minimally complicated cyst, and has no definite solid internal enhancement or mural nodularity. No other suspicious hepatic lesions are noted. Gallbladder is moderately distended. No filling defects in the gallbladder to suggest gallstones. Gallbladder wall thickness is normal. No pericholecystic fluid. No intrahepatic biliary ductal dilatation. Common bile duct is slightly dilated measuring 9 mm in the porta hepatis. However, there are no filling defects within the common bile duct to suggest choledocholithiasis. Pancreas: In the pancreatic head immediately adjacent to the ampulla (axial image 55 of series 13) there is a 6 mm T1 hypointense and T2 hyperintense lesion which demonstrates no definite enhancement on post gadolinium imaging, favored to represent a tiny side branch IPMN  (intraductal papillary mucinous neoplasm) or tiny pancreatic pseudocysts. No other suspicious appearing pancreatic lesions are identified. No pancreatic ductal dilatation noted on MRCP images. No peripancreatic fluid collections or inflammatory changes. Spleen:  Unremarkable. 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 This lesion appears encapsulated within Gerota's fascia and is well separated from the left renal vein which is widely patent at this time. Right kidney and bilateral adrenal glands are otherwise normal in appearance. No hydroureteronephrosis in the visualized portions of the abdomen. Stomach/Bowel: Scattered colonic diverticuli are noted. Otherwise, unremarkable. Vascular/Lymphatic: No aneurysm identified in the visualized abdominal vasculature. Retroaortic left renal vein (normal anatomical variant) incidentally noted. No lymphadenopathy noted in the abdomen. Other: No significant volume of ascites noted in the visualized portions of the peritoneal cavity. Musculoskeletal: No aggressive appearing osseous lesions are noted in the visualized portions of the skeleton. IMPRESSION: 1. Although there is mild dilatation of the common bile duct (9 mm), no choledocholithiasis or definite source of obstruction is noted. 2. There is a tiny 6 mm lesion in the pancreatic head adjacent to the ampulla, favored to represent a benign lesions such as a tiny side branch IPMN (intraductal papillary mucinous neoplasm) or pseudocyst. This is unlikely to be a cause of obstruction. However, repeat abdominal MRI with and without IV gadolinium with MRCP is recommended in 1 year to ensure the stability or regression  of this finding. This recommendation follows ACR consensus guidelines: Management of Incidental Pancreatic Cysts: A White Paper of the ACR Incidental Findings Committee. J Am Coll Radiol 2017;14:911-923. 3. 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 Referral to Urology for further clinical evaluation and follow-up management is recommended. 4. Mildly complex cyst in segment 8 of the liver, favored to be benign. Attention at time of repeat abdominal MRI with and without IV gadolinium is recommended to ensure stability. 5. Colonic diverticulosis. Electronically Signed   By: Trudie Reed M.D.   On: 12/06/2022 07:14   MR 3D Recon At Scanner  Result Date: 12/06/2022 CLINICAL DATA:  47 year old female with history of right upper quadrant abdominal pain. Suspected biliary disease. Dilated common bile duct noted on prior CT examination. EXAM: MRI ABDOMEN WITHOUT AND WITH CONTRAST (INCLUDING MRCP) TECHNIQUE: Multiplanar multisequence MR imaging of the abdomen was performed both before and after the administration of intravenous contrast.  Heavily T2-weighted images of the biliary and pancreatic ducts were obtained, and three-dimensional MRCP images were rendered by post processing. CONTRAST:  10mL GADAVIST GADOBUTROL 1 MMOL/ML IV SOLN COMPARISON:  None Available. FINDINGS: Lower chest: Unremarkable. Hepatobiliary: In segment 8 of the liver (axial image 6 of series 4 and coronal image 18 of series 3) there is a 1.8 x 1.4 x 1.8 cm T1 hypointense, T2 hyperintense lesion which has several thin internal septations which demonstrate low-level enhancement. This is favored to represent a minimally complicated cyst, and has no definite solid internal enhancement or mural nodularity. No other suspicious hepatic lesions are noted. Gallbladder is moderately distended. No filling defects in the gallbladder to suggest gallstones.  Gallbladder wall thickness is normal. No pericholecystic fluid. No intrahepatic biliary ductal dilatation. Common bile duct is slightly dilated measuring 9 mm in the porta hepatis. However, there are no filling defects within the common bile duct to suggest choledocholithiasis. Pancreas: In the pancreatic head immediately adjacent to the ampulla (axial image 55 of series 13) there is a 6 mm T1 hypointense and T2 hyperintense lesion which demonstrates no definite enhancement on post gadolinium imaging, favored to represent a tiny side branch IPMN (intraductal papillary mucinous neoplasm) or tiny pancreatic pseudocysts. No other suspicious appearing pancreatic lesions are identified. No pancreatic ductal dilatation noted on MRCP images. No peripancreatic fluid collections or inflammatory changes. Spleen:  Unremarkable. 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 This lesion appears encapsulated within Gerota's fascia and is well separated from the left renal vein which is widely patent at this time. Right kidney and bilateral adrenal glands are otherwise normal in appearance. No hydroureteronephrosis in the visualized portions of the abdomen. Stomach/Bowel: Scattered colonic diverticuli are noted. Otherwise, unremarkable. Vascular/Lymphatic: No aneurysm identified in the visualized abdominal vasculature. Retroaortic left renal vein (normal anatomical variant) incidentally noted. No lymphadenopathy noted in the abdomen. Other: No significant volume of ascites noted in the visualized portions of the peritoneal cavity. Musculoskeletal: No aggressive appearing osseous lesions are noted in the visualized portions  of the skeleton. IMPRESSION: 1. Although there is mild dilatation of the common bile duct (9 mm), no choledocholithiasis or definite source of obstruction is noted. 2. There is a tiny 6 mm lesion in the pancreatic head adjacent to the ampulla, favored to represent a benign lesions such as a tiny side branch IPMN (intraductal papillary mucinous neoplasm) or pseudocyst. This is unlikely to be a cause of obstruction. However, repeat abdominal MRI with and without IV gadolinium with MRCP is recommended in 1 year to ensure the stability or regression of this finding. This recommendation follows ACR consensus guidelines: Management of Incidental Pancreatic Cysts: A White Paper of the ACR Incidental Findings Committee. J Am Coll Radiol 2017;14:911-923. 3. 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 Referral to Urology for further clinical evaluation and follow-up management is recommended. 4. Mildly complex cyst in segment 8 of the liver, favored to be benign. Attention at time of repeat abdominal MRI with and without IV gadolinium is recommended to ensure stability. 5. Colonic diverticulosis. Electronically Signed   By: Brayton Mars.D.  On: 12/06/2022 07:14   CT ABDOMEN PELVIS W CONTRAST  Result Date: 12/05/2022 CLINICAL DATA:  Abdominal pain for several months. EXAM: CT ABDOMEN AND PELVIS WITH CONTRAST TECHNIQUE: Multidetector CT imaging of the abdomen and pelvis was performed using the standard protocol following bolus administration of intravenous contrast. RADIATION DOSE REDUCTION: This exam was performed according to the departmental dose-optimization program which includes automated exposure control, adjustment of the mA and/or kV according to patient size and/or use of iterative reconstruction technique. CONTRAST:  OMNIPAQUE IOHEXOL 300 MG/ML  SOLN COMPARISON:  None Available. FINDINGS: Lower  chest: There is some linear opacity lung bases likely scar or atelectasis. No pleural effusion. Hepatobiliary: Patent portal vein. Benign-appearing segment 8 hepatic cyst measuring 19 mm. Few thin septations. Few other tiny low-attenuation foci in the liver seen which are too small to completely characterize although again likely benign cystic foci. Gallbladder is mildly distended. There is a dilated common duct with what appears to be an abrupt cutoff at the ampulla. Stone or other lesion is possible. No significant intrahepatic biliary ductal dilatation. Recommend further evaluation. Pancreas: Unremarkable. No pancreatic ductal dilatation or surrounding inflammatory changes. Spleen: Normal in size without focal abnormality. Adrenals/Urinary Tract: The adrenal glands are preserved. No right-sided enhancing renal mass. No bilateral renal collecting system dilatation. The ureters have normal course and caliber extending down to the urinary bladder. There is a mass lesion along the lower aspect of the left kidney extending posteriorly measuring 3.1 by 2.8 cm with Hounsfield units of 126. Based on appearance this has worrisome for a renal cell carcinoma or neoplasm. Please correlate with any prior recommend dedicated imaging. Preserved contours of the urinary bladder. Stomach/Bowel: On this non oral contrast exam, the large bowel has a normal course and caliber with scattered stool. Normal appendix. There is left-sided colonic diverticula. In the sigmoid region there is wall thickening with stranding and vascular engorgement consistent with a area of diverticulitis. No complicating findings of free intra-abdominal air, rim enhancing fluid collection or obstruction. The stomach and small bowel are nondilated. Vascular/Lymphatic: Retroaortic left renal vein. Normal caliber aorta and IVC. Mild scattered atherosclerotic calcifications. No specific abnormal lymph node enlargement identified in the abdomen and pelvis.  Reproductive: Uterus is present. Multiple fibroids. No separate adnexal mass. Other: No free air or free fluid. Musculoskeletal: Scattered degenerative changes of the spine and pelvis. 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 Please correlate for known history. IMPRESSION: Wall thickening with stranding and diverticula along the sigmoid colon. Please correlate for colitis or diverticulitis. Recommend follow up after treatment to confirm clearance to exclude secondary pathology. No obstruction, abscess formation or free air. High density left-sided renal mass worrisome for enhancing lesion and possible renal cell carcinoma. Please correlate with prior or dedicated workup with the precontrast dataset with CT when appropriate or a pre and postcontrast MRI. Mildly dilated common bile duct with cut off appearance of the ampulla, pancreatic head. Underlying lesion is not excluded. Recommend further evaluation with MRI/MRCP when appropriate Electronically Signed   By: Karen Kays M.D.   On: 12/05/2022 17:42   US Abdomen Limited RUQ (LIVER/GB)  Result Date: 12/05/2022 CLINICAL DATA:  Right upper quadrant pain EXAM: ULTRASOUND ABDOMEN LIMITED RIGHT UPPER QUADRANT COMPARISON:  None Available. FINDINGS: Gallbladder: No gallstones or wall thickening visualized. No sonographic Murphy sign noted by sonographer. Common bile duct: Diameter: 9 mm Liver: No focal lesion identified. Within normal limits in parenchymal  echogenicity. Benign hepatic cysts identified with some septations and through transmission in the right hepatic lobe measuring 18 x 18 x 18 mm. Portal vein is patent on color Doppler imaging with normal direction of blood flow towards the liver. Other: None. IMPRESSION: Dilated common duct. Recommend further workup such as MRI/MRCP when clinically appropriate. No gallstones. Electronically Signed   By: Karen Kays M.D.   On: 12/05/2022 17:35     Microbiology: Results for orders placed or performed in visit on 04/20/20  Chlamydia/Gonococcus/Trichomonas, NAA     Status: None   Collection Time: 04/20/20 10:27 AM   Specimen: Urine   UR  Result Value Ref Range Status   Chlamydia by NAA Negative Negative Final   Gonococcus by NAA Negative Negative Final   Trich vag by NAA Negative Negative Final    Labs: CBC: Recent Labs  Lab 12/05/22 1139 12/06/22 0815 12/07/22 0700  WBC 6.4 5.9 4.2  HGB 11.6* 10.6* 10.0*  HCT 37.1 34.4* 32.0*  MCV 85.1 82.5 82.1  PLT 382 378 333   Basic Metabolic Panel: Recent Labs  Lab 12/05/22 1139 12/06/22 0440 12/07/22 0700  NA 137 136 133*  K 3.7 3.9 3.4*  CL 106 106 106  CO2 23 18* 21*  GLUCOSE 114* 109* 109*  BUN 15 18 11   CREATININE 0.74 0.97 0.96  CALCIUM 9.5 9.2 8.7*  MG  --  1.8 1.7  PHOS  --  5.0*  --    Liver Function Tests: Recent Labs  Lab 12/05/22 1139 12/06/22 0440 12/07/22 0700  AST 19 21 14*  ALT 19 17 17   ALKPHOS 73 69 59  BILITOT 0.9 0.5 0.5  PROT 8.5* 6.8 6.9  ALBUMIN 3.9 3.1* 3.1*   CBG: No results for input(s): "GLUCAP" in the last 168 hours.  Discharge time spent: approximately 35 minutes spent on discharge counseling, evaluation of patient on day of discharge, and coordination of discharge planning with nursing, social work, pharmacy and case management  Signed: Alberteen Sam, Maxwell [facility] 12/07/2022

## 2022-12-07 NOTE — TOC Transition Note (Signed)
Transition of Care [facility]) - CM/SW Discharge Note   Patient Details  Name: Kaylee Maxwell MRN: [id number] Date of Birth: 1976-01-12  Transition of Care [facility]) CM/SW Contact:  Tom-Johnson, Hershal Coria, RN Phone Number: 12/07/2022, 2:06 PM   Clinical Narrative:     Patient is scheduled for discharge today.  Outpatient f/u, hospital f/u and discharge instructions on AVS. No TOC needs or recommendations noted. Son, Ezekiel Slocumb to transport at discharge.  No further TOC needs noted.       Final next level of care: Home/Self Care Barriers to Discharge: Barriers Resolved   Patient Goals and CMS Choice CMS Medicare.gov Compare Post Acute Care list provided to:: Patient Choice offered to / list presented to : NA  Discharge Placement                  Patient to be transferred to facility by: Son Name of family member notified: Public relations account executive    Discharge Plan and Services Additional resources added to the After Visit Summary for                  DME Arranged: N/A DME Agency: NA       HH Arranged: NA HH Agency: NA        Social Determinants of Health (SDOH) Interventions SDOH Screenings   Food Insecurity: No Food Insecurity (03/21/2018)  Transportation Needs: No Transportation Needs (03/21/2018)  Alcohol Screen: Low Risk  (01/04/2021)  Depression (PHQ2-9): Low Risk  (10/28/2022)  Financial Resource Strain: High Risk (03/21/2018)  Physical Activity: Inactive (03/21/2018)  Social Connections: Moderately Integrated (03/21/2018)  Stress: Stress Concern Present (03/21/2018)  Tobacco Use: Medium Risk (10/28/2022)     Readmission Risk Interventions     No data to display

## 2022-12-07 NOTE — Telephone Encounter (Signed)
Mansouraty, Netty Starring., MD  Pyrtle, Carie Caddy, MD; Loretha Stapler, RN Evin Loiseau, Schedule this patient for Upper EUS in December. Thanks. GM   Pyrtle, Carie Caddy, MD  Mansouraty, Netty Starring., MD; Loretha Stapler, RN Let's get scheduled for EUS in Dec. She is aware of the need and may go home today. Will be getting URO eval for left RCC too Thanks JMP  Ernesta Trabert: right abd pain, bile duct dilation, panc head cyst       Previous Messages    ----- Message ----- From: Lemar Lofty., MD Sent: 12/07/2022  10:19 AM EDT To: Beverley Fiedler, MD Subject: RE: Inpatient Notes                            EUS is indicated with biliary dilation and rule out microlithiasis/biliary sludge. With normal LFTs and no Pancreatitis, then I'm not sure if this is biliary in origin. Let me know and this will be right now scheduled in December, unless I get an opening. Can talk about this later. GM ----- Message ----- From: Beverley Fiedler, MD Sent: 12/06/2022   4:00 PM EDT To: Lemar Lofty., MD Subject: Inpatient Notes                                Gabe, Could you take a look here.  I am a bit confused as to her abd pain etiology. Very small panc head cyst near ampulla.  CBD is dilated but no stones.  LFTs normal. 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 Seems like she is headed to EUS, but appreciate your thoughts. Also weird is she had a axillary LN bx 5 months ago which showed noncaseating granuloma (nothing further done at that time). Thanks for your opinion JMP

## 2022-12-07 NOTE — Plan of Care (Signed)

## 2022-12-07 NOTE — Telephone Encounter (Signed)
EUS has been set up for 02/02/23 at 1045 am [facility] with GM   Left message on machine to call back   Pt  being discharged from hospital today.  She will be given all information at discharge as well as all information mailed and sent to My Chart.

## 2022-12-12 ENCOUNTER — Other Ambulatory Visit (HOSPITAL_COMMUNITY): Payer: Self-pay

## 2022-12-12 DIAGNOSIS — D49512 Neoplasm of unspecified behavior of left kidney: Secondary | ICD-10-CM | POA: Diagnosis not present

## 2022-12-14 DIAGNOSIS — D49512 Neoplasm of unspecified behavior of left kidney: Secondary | ICD-10-CM | POA: Diagnosis not present

## 2022-12-27 ENCOUNTER — Other Ambulatory Visit: Payer: Self-pay | Admitting: Urology

## 2023-01-25 ENCOUNTER — Encounter (HOSPITAL_COMMUNITY): Payer: Self-pay | Admitting: Gastroenterology

## 2023-01-30 ENCOUNTER — Other Ambulatory Visit: Payer: Self-pay

## 2023-01-30 ENCOUNTER — Other Ambulatory Visit (HOSPITAL_COMMUNITY): Payer: Self-pay

## 2023-02-02 ENCOUNTER — Ambulatory Visit (HOSPITAL_COMMUNITY): Payer: 59 | Admitting: Anesthesiology

## 2023-02-02 ENCOUNTER — Other Ambulatory Visit: Payer: Self-pay

## 2023-02-02 ENCOUNTER — Encounter (HOSPITAL_COMMUNITY)
Admission: RE | Disposition: A | Discharge status: Home / Self Care | Payer: Self-pay | Source: Home / Self Care | Attending: Gastroenterology

## 2023-02-02 ENCOUNTER — Ambulatory Visit (HOSPITAL_COMMUNITY)
Admission: RE | Admit: 2023-02-02 | Discharge: 2023-02-02 | Disposition: A | Discharge status: Home / Self Care | Payer: 59 | Attending: Gastroenterology | Admitting: Gastroenterology

## 2023-02-02 ENCOUNTER — Encounter (HOSPITAL_COMMUNITY): Payer: Self-pay | Admitting: Gastroenterology

## 2023-02-02 DIAGNOSIS — K2970 Gastritis, unspecified, without bleeding: Secondary | ICD-10-CM

## 2023-02-02 DIAGNOSIS — K862 Cyst of pancreas: Secondary | ICD-10-CM | POA: Insufficient documentation

## 2023-02-02 DIAGNOSIS — K838 Other specified diseases of biliary tract: Secondary | ICD-10-CM | POA: Insufficient documentation

## 2023-02-02 DIAGNOSIS — K869 Disease of pancreas, unspecified: Secondary | ICD-10-CM | POA: Diagnosis not present

## 2023-02-02 DIAGNOSIS — K2980 Duodenitis without bleeding: Secondary | ICD-10-CM | POA: Diagnosis not present

## 2023-02-02 DIAGNOSIS — I10 Essential (primary) hypertension: Secondary | ICD-10-CM | POA: Diagnosis not present

## 2023-02-02 DIAGNOSIS — I899 Noninfective disorder of lymphatic vessels and lymph nodes, unspecified: Secondary | ICD-10-CM

## 2023-02-02 DIAGNOSIS — K2950 Unspecified chronic gastritis without bleeding: Secondary | ICD-10-CM | POA: Insufficient documentation

## 2023-02-02 DIAGNOSIS — K449 Diaphragmatic hernia without obstruction or gangrene: Secondary | ICD-10-CM

## 2023-02-02 DIAGNOSIS — K5710 Diverticulosis of small intestine without perforation or abscess without bleeding: Secondary | ICD-10-CM | POA: Diagnosis not present

## 2023-02-02 DIAGNOSIS — Z87891 Personal history of nicotine dependence: Secondary | ICD-10-CM | POA: Insufficient documentation

## 2023-02-02 DIAGNOSIS — Z5986 Financial insecurity: Secondary | ICD-10-CM | POA: Diagnosis not present

## 2023-02-02 DIAGNOSIS — K2289 Other specified disease of esophagus: Secondary | ICD-10-CM | POA: Insufficient documentation

## 2023-02-02 DIAGNOSIS — K2990 Gastroduodenitis, unspecified, without bleeding: Secondary | ICD-10-CM

## 2023-02-02 DIAGNOSIS — R1011 Right upper quadrant pain: Secondary | ICD-10-CM

## 2023-02-02 DIAGNOSIS — E119 Type 2 diabetes mellitus without complications: Secondary | ICD-10-CM | POA: Diagnosis not present

## 2023-02-02 DIAGNOSIS — K319 Disease of stomach and duodenum, unspecified: Secondary | ICD-10-CM | POA: Diagnosis not present

## 2023-02-02 DIAGNOSIS — K3189 Other diseases of stomach and duodenum: Secondary | ICD-10-CM | POA: Insufficient documentation

## 2023-02-02 DIAGNOSIS — R109 Unspecified abdominal pain: Secondary | ICD-10-CM | POA: Diagnosis not present

## 2023-02-02 DIAGNOSIS — Z79899 Other long term (current) drug therapy: Secondary | ICD-10-CM | POA: Insufficient documentation

## 2023-02-02 DIAGNOSIS — K259 Gastric ulcer, unspecified as acute or chronic, without hemorrhage or perforation: Secondary | ICD-10-CM | POA: Diagnosis not present

## 2023-02-02 HISTORY — PX: ESOPHAGOGASTRODUODENOSCOPY: SHX5428

## 2023-02-02 HISTORY — PX: EUS: SHX5427

## 2023-02-02 HISTORY — PX: BIOPSY: SHX5522

## 2023-02-02 LAB — GLUCOSE, CAPILLARY: Glucose-Capillary: 124 mg/dL — ABNORMAL HIGH (ref 70–99)

## 2023-02-02 SURGERY — UPPER ENDOSCOPIC ULTRASOUND (EUS) RADIAL
Anesthesia: Monitor Anesthesia Care

## 2023-02-02 MED ORDER — SODIUM CHLORIDE 0.9 % IV SOLN: INTRAVENOUS | Status: DC

## 2023-02-02 MED ORDER — PROPOFOL 1000 MG/100ML IV EMUL
INTRAVENOUS | Status: AC
  Filled 2023-02-02: qty 100

## 2023-02-02 MED ORDER — PROPOFOL 500 MG/50ML IV EMUL
INTRAVENOUS | Status: DC | PRN
  Administered 2023-02-02: 150 ug/kg/min via INTRAVENOUS

## 2023-02-02 MED ORDER — PANTOPRAZOLE SODIUM 40 MG PO TBEC
40.0000 mg | DELAYED_RELEASE_TABLET | Freq: Two times a day (BID) | ORAL | 12 refills | Status: DC
  Filled 2023-02-02: qty 60, 30d supply, fill #0
  Filled 2023-03-01: qty 60, 30d supply, fill #1
  Filled 2023-03-29: qty 60, 30d supply, fill #2
  Filled 2023-05-02: qty 60, 30d supply, fill #3
  Filled 2023-06-08: qty 60, 30d supply, fill #4

## 2023-02-02 MED ORDER — LIDOCAINE HCL (PF) 2 % IJ SOLN
INTRAMUSCULAR | Status: DC | PRN
  Administered 2023-02-02 (×2): 50 mg via INTRADERMAL

## 2023-02-02 NOTE — Discharge Instructions (Signed)

## 2023-02-02 NOTE — Op Note (Signed)
[facility] Patient Name: Kaylee Maxwell Procedure Date: 02/02/2023 MRN: [id number] Attending MD: Corliss Parish , MD, [id number] Date of Birth: 04/11/1975 CSN: [id number] Age: 47 Admit Type: Outpatient Procedure:                Upper EUS Indications:              Common bile duct dilation (acquired) seen on MRCP,                            Pancreatic cyst on MRCP, Abdominal pain in the                            right upper quadrant Providers:                Corliss Parish, MD, Norman Clay, RN, Alan Ripper,                            Technician Referring MD:              Medicines:                Monitored Anesthesia Care Complications:            No immediate complications. Estimated Blood Loss:     Estimated blood loss was minimal. Procedure:                Pre-Anesthesia Assessment:                           - Prior to the procedure, a History and Physical                            was performed, and patient medications and                            allergies were reviewed. The patient's tolerance of                            previous anesthesia was also reviewed. The risks                            and benefits of the procedure and the sedation                            options and risks were discussed with the patient.                            All questions were answered, and informed consent                            was obtained. Prior Anticoagulants: The patient has                            taken no anticoagulant or antiplatelet agents. ASA  Grade Assessment: II - A patient with mild systemic                            disease. After reviewing the risks and benefits,                            the patient was deemed in satisfactory condition to                            undergo the procedure.                           After obtaining informed consent, the endoscope was                            passed under direct  vision. Throughout the                            procedure, the patient's blood pressure, pulse, and                            oxygen saturations were monitored continuously. The                            [id number]-[id number] ([id number]) Olympus endoscope was introduced                            through the mouth, and advanced to the second part                            of duodenum. The [id number]-[id number] ([id number]) Olympus                            duodenoscope was introduced through the mouth, and                            advanced to the area of papilla. The [id number]                            ([id number]) Olympus linear ultrasound scope was                            introduced through the mouth, and advanced to the                            duodenum for ultrasound examination from the                            stomach and duodenum. The upper EUS was                            accomplished without difficulty. The patient  tolerated the procedure. Scope In: Scope Out: Findings:      ENDOSCOPIC FINDING: :      No gross lesions were noted in the entire esophagus.      The Z-line was irregular and was found 37 cm from the incisors.      A 2 cm hiatal hernia was present.      Striped moderately erythematous mucosa and small erosions without       bleeding was found in the distal gastric body, in the gastric antrum and       in the prepyloric region of the stomach.      No other gross lesions were noted in the entire examined stomach.       Biopsies were taken with a cold forceps for histology and Helicobacter       pylori testing.      Patchy mildly erythematous mucosa without active bleeding and with no       stigmata of bleeding was found in the duodenal bulb, in the first       portion of the duodenum and in the second portion of the duodenum.       Biopsies were taken with a cold forceps for histology.      A small non-bleeding diverticulum was found in the suprapapillary  region.      The major papilla was otherwise normal.      ENDOSONOGRAPHIC FINDING: :      There was dilation in the common bile duct (2.2 -> 6.5 -> 7.0 mm) and in       the common hepatic duct (9.7 mm). No evidence of choledocholithiasis       with a regular contour throughout.      There was no sign of significant endosonographic abnormality in the       gallbladder. No stones and no biliary sludge were identified.      A pancreatic parenchymal abnormality was noted within the head of the       pancreas. A single hyperechoic foci with shadowing was noted. No       evidence of any other hyperechoic foci throughout the rest of the       visualized pancreas.      Pancreatic parenchymal abnormalities were noted in the entire pancreas.       These consisted of lobularity without honeycombing.      The pancreas duct was otherwise normal in appearance. The diameter of       the main pancreatic duct (MPD) measured:      - HOP 1.3 -> 2.2 mm mm (head of pancreas)      - NOP 1.7 mm (neck of pancreas)      - BOP 1.6 mm (body of the pancreas)      - TOP 0.9 mm (tail of the pancreas).      Endosonographic imaging of the ampulla showed no intramural       (subepithelial) lesion.      Endosonographic imaging in the visualized portion of the liver showed no       mass.      No malignant-appearing lymph nodes were visualized in the celiac region       (level 20), peripancreatic region and porta hepatis region.      The celiac region was visualized. Impression:               EGD impression:                           -  No gross lesions in the entire esophagus. Z-line                            irregular, 37 cm from the incisors.                           - 2 cm hiatal hernia.                           - Erythematous mucosa and erosions in the distal                            gastric body, antrum and prepyloric region of the                            stomach. No other gross lesions in the entire                             stomach. Biopsied.                           - Erythematous duodenopathy noted in the                            bulb/sweep/D2. Biopsied.                           - Non-bleeding duodenal diverticulum found                            suprapapillary. Normal major papilla.                           EUS impression:                           - There was dilation in the common bile duct and in                            the common hepatic duct. No evidence of                            choledocholithiasis or biliary stricture noted.                            Regular contour was noted throughout.                           - There was no sign of significant pathology in the                            gallbladder (i.e. no evidence of                            cholelithiasis/microlithiasis).                           -  A single pancreatic parenchymal hyperechoic foci                            was noted in the pancreatic head. The rest of the                            pancreatic parenchyma consisted of lobularity                            without honeycombing throughout the entire pancreas.                           - Main pancreatic duct (MPD) diameter was measured.                            Endosonographically, the MPD had a normal                            appearance.                           - No intramural ampullary lesion noted.                           - No malignant-appearing lymph nodes were                            visualized in the celiac region (level 20),                            peripancreatic region and porta hepatis region. Moderate Sedation:      Not Applicable - Patient had care per Anesthesia. Recommendation:           - The patient will be observed post-procedure,                            until all discharge criteria are met.                           - Discharge patient to home.                           - Patient has a contact number  available for                            emergencies. The signs and symptoms of potential                            delayed complications were discussed with the                            patient. Return to normal activities tomorrow.                            Written discharge instructions were provided to the  patient.                           - Resume previous diet.                           - Observe patient's clinical course.                           - Await path results.                           - Increase PPI to twice daily dosing for next month                            then may decrease back to once daily.                           - Continue present medications.                           - Recommend repeat MRI/MRCP in 1 to 2 years with                            primary gastroenterologist to monitor CBD/CHD                            dilation, since nothing was noted currently but she                            still has a bile duct in place, to see if something                            else develops that may have been subtle or changes                            over the course of time, but could require repeat                            endoscopic ultrasound evaluation.                           - The findings and recommendations were discussed                            with the patient.                           - The findings and recommendations were discussed                            with the designated responsible adult. Procedure Code(s):        --- Professional ---  40981, Esophagogastroduodenoscopy, flexible,                            transoral; with endoscopic ultrasound examination                            limited to the esophagus, stomach or duodenum, and                            adjacent structures                           43239, Esophagogastroduodenoscopy, flexible,                             transoral; with biopsy, single or multiple Diagnosis Code(s):        --- Professional ---                           K22.89, Other specified disease of esophagus                           K44.9, Diaphragmatic hernia without obstruction or                            gangrene                           K31.89, Other diseases of stomach and duodenum                           K83.8, Other specified diseases of biliary tract                           K86.9, Disease of pancreas, unspecified                           I89.9, Noninfective disorder of lymphatic vessels                            and lymph nodes, unspecified                           K86.2, Cyst of pancreas                           R10.11, Right upper quadrant pain                           K57.10, Diverticulosis of small intestine without                            perforation or abscess without bleeding CPT copyright 2022 American Medical Association. All rights reserved. The codes documented in this report are preliminary and upon coder review may  be revised to meet current compliance requirements. Corliss Parish, MD 02/02/2023 11:31:35 AM Number of Addenda: 0

## 2023-02-02 NOTE — Transfer of Care (Signed)
Immediate Anesthesia Transfer of Care Note  Patient: Kaylee Maxwell  Procedure(s) Performed: UPPER ENDOSCOPIC ULTRASOUND (EUS) RADIAL ESOPHAGOGASTRODUODENOSCOPY (EGD) BIOPSY  Patient Location: PACU  Anesthesia Type:MAC  Level of Consciousness: awake and alert   Airway & Oxygen Therapy: Patient Spontanous Breathing  Post-op Assessment: Report given to RN and Post -op Vital signs reviewed and stable  Post vital signs: Reviewed and stable  Last Vitals:  Vitals Value Taken Time  BP 116/70 02/02/23 1118  Temp 36.1 C 02/02/23 1118  Pulse 80 02/02/23 1119  Resp 21 02/02/23 1119  SpO2 99 % 02/02/23 1119  Vitals shown include unfiled device data.  Last Pain:  Vitals:   02/02/23 1118  TempSrc: Temporal  PainSc: 0-No pain         Complications: No notable events documented.

## 2023-02-02 NOTE — Anesthesia Postprocedure Evaluation (Signed)
Anesthesia Post Note  Patient: Kaylee Maxwell  Procedure(s) Performed: UPPER ENDOSCOPIC ULTRASOUND (EUS) RADIAL ESOPHAGOGASTRODUODENOSCOPY (EGD) BIOPSY     Patient location during evaluation: PACU Anesthesia Type: MAC Level of consciousness: awake and alert Pain management: pain level controlled Vital Signs Assessment: post-procedure vital signs reviewed and stable Respiratory status: spontaneous breathing, nonlabored ventilation, respiratory function stable and patient connected to nasal cannula oxygen Cardiovascular status: stable and blood pressure returned to baseline Postop Assessment: no apparent nausea or vomiting Anesthetic complications: no   No notable events documented.  Last Vitals:  Vitals:   02/02/23 1135 02/02/23 1140  BP: 131/76 (!) 125/92  Pulse: 66 65  Resp: 18 20  Temp:    SpO2: 100% 100%    Last Pain:  Vitals:   02/02/23 1140  TempSrc:   PainSc: 0-No pain                 Earl Lites P Sharnese Heath
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
## 2023-02-02 NOTE — Anesthesia Preprocedure Evaluation (Signed)
Anesthesia Evaluation  Patient identified by MRN, date of birth, ID band Patient awake    Reviewed: Allergy & Precautions, NPO status , Patient's Chart, lab work & pertinent test results  Airway Mallampati: II  TM Distance: >3 FB Neck ROM: Full    Dental no notable dental hx.    Pulmonary neg pulmonary ROS, former smoker   Pulmonary exam normal        Cardiovascular hypertension, Pt. on medications and Pt. on home beta blockers  Rhythm:Regular Rate:Normal     Neuro/Psych   Anxiety     negative neurological ROS     GI/Hepatic Neg liver ROS,,, abdominal pain, bile duct dilation, pancreatic cyst   Endo/Other  diabetes, Type 2    Renal/GU negative Renal ROS  negative genitourinary   Musculoskeletal negative musculoskeletal ROS (+)    Abdominal Normal abdominal exam  (+)   Peds  Hematology  (+) Blood dyscrasia, anemia   Anesthesia Other Findings   Reproductive/Obstetrics                             Anesthesia Physical Anesthesia Plan  ASA: 2  Anesthesia Plan: MAC   Post-op Pain Management:    Induction:   PONV Risk Score and Plan:   Airway Management Planned: Simple Face Mask and Nasal Cannula  Additional Equipment: None  Intra-op Plan:   Post-operative Plan:   Informed Consent: I have reviewed the patients History and Physical, chart, labs and discussed the procedure including the risks, benefits and alternatives for the proposed anesthesia with the patient or authorized representative who has indicated his/her understanding and acceptance.     Dental advisory given  Plan Discussed with: CRNA  Anesthesia Plan Comments:        Anesthesia Quick Evaluation

## 2023-02-03 LAB — SURGICAL PATHOLOGY

## 2023-02-05 ENCOUNTER — Encounter (HOSPITAL_COMMUNITY): Payer: Self-pay | Admitting: Gastroenterology

## 2023-02-06 ENCOUNTER — Encounter: Payer: Self-pay | Admitting: Gastroenterology

## 2023-02-09 ENCOUNTER — Other Ambulatory Visit (HOSPITAL_BASED_OUTPATIENT_CLINIC_OR_DEPARTMENT_OTHER): Payer: Self-pay

## 2023-02-22 ENCOUNTER — Other Ambulatory Visit (HOSPITAL_COMMUNITY): Payer: Self-pay

## 2023-03-01 ENCOUNTER — Encounter (HOSPITAL_COMMUNITY): Payer: 59

## 2023-03-01 ENCOUNTER — Other Ambulatory Visit (HOSPITAL_COMMUNITY): Payer: Self-pay

## 2023-03-07 ENCOUNTER — Inpatient Hospital Stay (HOSPITAL_COMMUNITY): Admit: 2023-03-07 | Payer: 59 | Admitting: Urology

## 2023-03-07 ENCOUNTER — Other Ambulatory Visit (HOSPITAL_COMMUNITY): Payer: Self-pay

## 2023-03-07 SURGERY — XI ROBOTIC ASSITED PARTIAL NEPHRECTOMY
Anesthesia: General | Laterality: Left

## 2023-03-29 ENCOUNTER — Other Ambulatory Visit (HOSPITAL_COMMUNITY): Payer: Self-pay

## 2023-04-28 ENCOUNTER — Encounter: Payer: Self-pay | Admitting: Nurse Practitioner

## 2023-05-02 ENCOUNTER — Other Ambulatory Visit: Payer: Self-pay | Admitting: Nurse Practitioner

## 2023-05-02 DIAGNOSIS — F5101 Primary insomnia: Secondary | ICD-10-CM

## 2023-05-03 ENCOUNTER — Other Ambulatory Visit: Payer: Self-pay

## 2023-05-03 ENCOUNTER — Other Ambulatory Visit (HOSPITAL_COMMUNITY): Payer: Self-pay

## 2023-05-03 MED ORDER — TRAZODONE HCL 50 MG PO TABS
50.0000 mg | ORAL_TABLET | Freq: Every evening | ORAL | 1 refills | Status: AC | PRN
  Filled 2023-05-03: qty 90, 90d supply, fill #0
  Filled 2024-01-29: qty 90, 90d supply, fill #1

## 2023-05-04 ENCOUNTER — Other Ambulatory Visit (HOSPITAL_COMMUNITY): Payer: Self-pay

## 2023-05-04 ENCOUNTER — Other Ambulatory Visit: Payer: Self-pay

## 2023-05-04 ENCOUNTER — Other Ambulatory Visit: Payer: Self-pay | Admitting: Cardiovascular Disease

## 2023-05-04 MED ORDER — VALSARTAN 80 MG PO TABS
80.0000 mg | ORAL_TABLET | Freq: Every day | ORAL | 0 refills | Status: DC
  Filled 2023-05-04: qty 30, 30d supply, fill #0

## 2023-05-04 MED ORDER — SPIRONOLACTONE 25 MG PO TABS
25.0000 mg | ORAL_TABLET | Freq: Every day | ORAL | 0 refills | Status: DC
  Filled 2023-05-04: qty 30, 30d supply, fill #0

## 2023-05-08 ENCOUNTER — Other Ambulatory Visit: Payer: Self-pay | Admitting: Urology

## 2023-06-01 DIAGNOSIS — K5730 Diverticulosis of large intestine without perforation or abscess without bleeding: Secondary | ICD-10-CM | POA: Diagnosis not present

## 2023-06-01 DIAGNOSIS — K7689 Other specified diseases of liver: Secondary | ICD-10-CM | POA: Diagnosis not present

## 2023-06-01 DIAGNOSIS — N200 Calculus of kidney: Secondary | ICD-10-CM | POA: Diagnosis not present

## 2023-06-01 DIAGNOSIS — D49512 Neoplasm of unspecified behavior of left kidney: Secondary | ICD-10-CM | POA: Diagnosis not present

## 2023-06-01 DIAGNOSIS — K5710 Diverticulosis of small intestine without perforation or abscess without bleeding: Secondary | ICD-10-CM | POA: Diagnosis not present

## 2023-06-06 ENCOUNTER — Ambulatory Visit (INDEPENDENT_AMBULATORY_CARE_PROVIDER_SITE_OTHER): Payer: Self-pay | Admitting: Nurse Practitioner

## 2023-06-06 ENCOUNTER — Other Ambulatory Visit: Payer: Self-pay

## 2023-06-06 ENCOUNTER — Other Ambulatory Visit (HOSPITAL_COMMUNITY): Payer: Self-pay

## 2023-06-06 ENCOUNTER — Encounter: Payer: Self-pay | Admitting: Nurse Practitioner

## 2023-06-06 VITALS — BP 124/76 | HR 100 | Temp 98.3°F | Wt 215.4 lb

## 2023-06-06 DIAGNOSIS — F5101 Primary insomnia: Secondary | ICD-10-CM | POA: Diagnosis not present

## 2023-06-06 DIAGNOSIS — E66812 Obesity, class 2: Secondary | ICD-10-CM | POA: Diagnosis not present

## 2023-06-06 DIAGNOSIS — E119 Type 2 diabetes mellitus without complications: Secondary | ICD-10-CM | POA: Diagnosis not present

## 2023-06-06 DIAGNOSIS — Z0000 Encounter for general adult medical examination without abnormal findings: Secondary | ICD-10-CM | POA: Diagnosis not present

## 2023-06-06 DIAGNOSIS — E785 Hyperlipidemia, unspecified: Secondary | ICD-10-CM | POA: Diagnosis not present

## 2023-06-06 DIAGNOSIS — Z6835 Body mass index (BMI) 35.0-35.9, adult: Secondary | ICD-10-CM

## 2023-06-06 DIAGNOSIS — Z1231 Encounter for screening mammogram for malignant neoplasm of breast: Secondary | ICD-10-CM

## 2023-06-06 DIAGNOSIS — I10 Essential (primary) hypertension: Secondary | ICD-10-CM

## 2023-06-06 DIAGNOSIS — N2889 Other specified disorders of kidney and ureter: Secondary | ICD-10-CM | POA: Diagnosis not present

## 2023-06-06 LAB — POCT GLYCOSYLATED HEMOGLOBIN (HGB A1C): Hemoglobin A1C: 7.1 % — AB (ref 4.0–5.6)

## 2023-06-06 MED ORDER — EMPAGLIFLOZIN 10 MG PO TABS
10.0000 mg | ORAL_TABLET | Freq: Every day | ORAL | 2 refills | Status: DC
  Filled 2023-06-06: qty 30, 30d supply, fill #0

## 2023-06-06 MED ORDER — ATORVASTATIN CALCIUM 10 MG PO TABS
10.0000 mg | ORAL_TABLET | Freq: Every day | ORAL | 1 refills | Status: DC
  Filled 2023-06-06: qty 90, 90d supply, fill #0
  Filled 2023-09-19: qty 90, 90d supply, fill #1

## 2023-06-06 NOTE — Patient Instructions (Signed)
 Goal for fasting blood sugar ranges from 80 to 120 and 2 hours after any meal or at bedtime should be between 130 to 170.  Please start taking Jardiance 10 mg daily   Please consider getting pneumococcal vaccine at local pharmacy.     1. Type 2 diabetes mellitus without complication, without long-term current use of insulin (HCC) (Primary)  - Microalbumin/Creatinine Ratio, Urine - POCT glycosylated hemoglobin (Hb A1C) - CBC; Future - CMP14+EGFR; Future - empagliflozin (JARDIANCE) 10 MG TABS tablet; Take 1 tablet (10 mg total) by mouth daily before breakfast.  Dispense: 30 tablet; Refill: 2  2. Dyslipidemia, goal LDL below 70  - Lipid panel; Future  3. Primary insomnia Home sleep study ordered   4. Screening mammogram for breast cancer  - MM 3D SCREENING MAMMOGRAM BILATERAL BREAST; Future Please call [phone]   to schedule your mammogram.  [facility]. [address] [place], [place] [address]. [place] .        It is important that you exercise regularly at least 30 minutes 5 times a week as tolerated  Think about what you will eat, plan ahead. Choose " clean, green, fresh or frozen" over canned, processed or packaged foods which are more sugary, salty and fatty. 70 to 75% of food eaten should be vegetables and fruit. Three meals at set times with snacks allowed between meals, but they must be fruit or vegetables. Aim to eat over a 12 hour period , example 7 am to 7 pm, and STOP after  your last meal of the day. Drink water,generally about 64 ounces per day, no other drink is as healthy. Fruit juice is best enjoyed in a healthy way, by EATING the fruit.  Thanks for choosing [facility] we consider it a privelige to serve you.

## 2023-06-06 NOTE — Assessment & Plan Note (Signed)
 Wt Readings from Last 3 Encounters:  06/06/23 215 lb 6.4 oz (97.7 kg)  02/02/23 205 lb (93 kg)  12/05/22 203 lb (92.1 kg)   Body mass index is 35.84 kg/m.  She has added 10 pounds since last weight check Patient counseled on low-carb diet Encouraged engage in regular moderate exercises at least 150 minutes weekly

## 2023-06-06 NOTE — Assessment & Plan Note (Signed)
 Lab Results  Component Value Date   HGBA1C 7.1 (A) 06/06/2023  Patient counseled on low-carb diet Starting Jardiance 10 mg daily CBG goals provided, up-to-date with diabetic eye exam Follow-up in 4 weeks, diabetic foot exam at next visit

## 2023-06-06 NOTE — Assessment & Plan Note (Signed)
 Controlled on metoprolol  50 mg daily, spironolactone  25 mg daily, valsartan  80 mg daily, continue current medications DASH diet and commitment to daily physical activity for a minimum of 30 minutes discussed and encouraged, as a part of hypertension management. The importance of attaining a healthy weight is also discussed.     06/06/2023    2:32 PM 02/02/2023   11:40 AM 02/02/2023   11:35 AM 02/02/2023   11:33 AM 02/02/2023   11:32 AM 02/02/2023   11:21 AM 02/02/2023   11:18 AM  BP/Weight  Systolic BP 124 125 131 131 133 122 116  Diastolic BP 76 92 76 76 106 64 70  Wt. (Lbs) 215.4        BMI 35.84 kg/m2

## 2023-06-06 NOTE — Assessment & Plan Note (Signed)
Has upcoming surgery. 

## 2023-06-06 NOTE — Assessment & Plan Note (Signed)
Annual exam as documented.  ?Counseling done include healthy lifestyle involving committing to 150 minutes of exercise per week, heart healthy diet, and attaining healthy weight. The importance of adequate sleep also discussed.  ?Regular use of seat belt and home safety were also discussed . ?Changes in health habits are decided on by patient with goals and time frames set for achieving them. ?Immunization and cancer screening  needs are specifically addressed at this visit.   ?

## 2023-06-06 NOTE — Progress Notes (Signed)
 Complete physical exam  Patient: Kaylee Maxwell   DOB: 07-25-1975   48 y.o. Female  MRN: [id number]  Subjective:    Chief Complaint  Patient presents with   Diabetes    Kaylee Maxwell is a 48 y.o. female  has a past medical history of Anxiety (03/04/2018), Breast cancer screening (03/04/2018), Heart palpitations (03/04/2018), Hypertension, Irregular heartbeat (03/04/2018), Mild mitral regurgitation, Obesity, PVC's (premature ventricular contractions), Scalp mass (10/09/2012), Sinus tachycardia, and Type 2 diabetes mellitus (HCC). who presents today for a complete physical exam. She reports  trying to eat low carb, walks  2000 steps  on most days   She generally feels fairly well. She reports sleeping poorly.   Hypertension.  On valsartan  40 mg daily, metoprolol  50 mg daily.,  Spironolactone  25 mg daily denies chest pain, shortness of breath, edema  Insomnia currently on trazodone  50 mg at bedtime as needed, she reports snoring, has trouble falling asleep and gets about 5 hours of sleep nightly. Patient Type 2 diabetes.  Ozempic  was discontinued at the hospital due to abdominal pain, also tried metformin in the past that gave her abdominal pain.  No complaints of polyphagia polyuria polydipsia  Renal cell carcinoma .diagnosed in October 2024 ,has upcoming robotic assisted left partial nephrectomy   Mammogram ordered to screen for breast cancer, plans on getting cervical cancer done after her kidney surgery   Need for pneumococcal vaccine discussed patient declined the vaccine  Most recent fall risk assessment:    10/28/2022    3:10 PM  Fall Risk   Falls in the past year? 0  Injury with Fall? 0  Risk for fall due to : No Fall Risks     Most recent depression screenings:    06/06/2023    3:36 PM 10/28/2022    3:10 PM  PHQ 2/9 Scores  PHQ - 2 Score 0 0  PHQ- 9 Score 4         Patient Care Team: Kaylee Lanzo R, FNP as PCP - General (Nurse Practitioner) Nahser,  Lela Purple, MD as PCP - Cardiology (Cardiology)   Outpatient Medications Prior to Visit  Medication Sig Note   metoprolol  succinate (TOPROL -XL) 50 MG 24 hr tablet Take 1 tablet (50 mg total) by mouth daily. Take with or immediately following a meal.    pantoprazole  (PROTONIX ) 40 MG tablet Take 1 tablet (40 mg total) by mouth 2 (two) times daily before a meal. Twice daily for 1 month then may decrease to once daily as per prior prescription. 06/06/2023: Takes once daily   spironolactone  (ALDACTONE ) 25 MG tablet Take 1 tablet (25 mg total) by mouth daily. Please call [phone] to schedule an appointment for future refills. Thank you.    traZODone  (DESYREL ) 50 MG tablet Take 1 tablet (50 mg total) by mouth at bedtime as needed for sleep    valsartan  (DIOVAN ) 80 MG tablet Take 1 tablet (80 mg total) by mouth daily. Please call [phone] to schedule an appointment for future refills. Thank you.    Blood Glucose Monitoring Suppl (FREESTYLE LITE) w/Device KIT Use as directed (Patient not taking: Reported on 10/28/2022)    glucose blood (GNP TRUE METRIX GLUCOSE STRIPS) test strip Use as instructed (Patient not taking: Reported on 05/24/2022)    Lancets (FREESTYLE) lancets Use as directed (Patient not taking: Reported on 05/24/2022)    [DISCONTINUED] atorvastatin  (LIPITOR) 10 MG tablet Take 1 tablet (10 mg total) by mouth daily. (Patient not taking: Reported on 06/06/2023)    [DISCONTINUED]  traMADol  (ULTRAM ) 50 MG tablet Take 1 tablet (50 mg total) by mouth every 6 (six) hours as needed for severe pain (pain score 7-10). (Patient not taking: Reported on 06/06/2023)    No facility-administered medications prior to visit.    Review of Systems  Constitutional:  Negative for appetite change, chills, fatigue and fever.  HENT:  Negative for congestion, postnasal drip, rhinorrhea and sneezing.   Eyes:  Negative for pain, discharge and itching.  Respiratory:  Negative for cough, shortness of breath and wheezing.    Cardiovascular:  Negative for chest pain, palpitations and leg swelling.  Gastrointestinal:  Negative for abdominal pain, constipation, nausea and vomiting.  Endocrine: Negative for polydipsia, polyphagia and polyuria.  Genitourinary:  Negative for difficulty urinating, dysuria, flank pain and frequency.  Musculoskeletal:  Negative for arthralgias, back pain, joint swelling and myalgias.  Skin:  Negative for color change, pallor, rash and wound.  Allergic/Immunologic: Negative for food allergies and immunocompromised state.  Neurological:  Negative for dizziness, facial asymmetry, weakness, numbness and headaches.  Psychiatric/Behavioral:  Negative for behavioral problems, confusion, self-injury and suicidal ideas.        Objective:     BP 124/76   Pulse 100   Temp 98.3 F (36.8 C) (Oral)   Wt 215 lb 6.4 oz (97.7 kg)   SpO2 98%   BMI 35.84 kg/m    Physical Exam Vitals and nursing note reviewed. Exam conducted with a chaperone present.  Constitutional:      General: She is not in acute distress.    Appearance: Normal appearance. She is obese. She is not ill-appearing, toxic-appearing or diaphoretic.  HENT:     Right Ear: Tympanic membrane, ear canal and external ear normal. There is no impacted cerumen.     Left Ear: Tympanic membrane, ear canal and external ear normal. There is no impacted cerumen.     Nose: Nose normal. No congestion or rhinorrhea.     Mouth/Throat:     Mouth: Mucous membranes are moist.     Pharynx: Oropharynx is clear. No oropharyngeal exudate or posterior oropharyngeal erythema.  Eyes:     General: No scleral icterus.       Right eye: No discharge.        Left eye: No discharge.     Extraocular Movements: Extraocular movements intact.     Conjunctiva/sclera: Conjunctivae normal.  Neck:     Vascular: No carotid bruit.  Cardiovascular:     Rate and Rhythm: Normal rate and regular rhythm.     Pulses: Normal pulses.     Heart sounds: Normal heart  sounds. No murmur heard.    No friction rub. No gallop.  Pulmonary:     Effort: Pulmonary effort is normal. No respiratory distress.     Breath sounds: Normal breath sounds. No stridor. No wheezing, rhonchi or rales.  Chest:     Chest wall: No mass, lacerations, deformity, swelling or tenderness. There is no dullness to percussion.  Breasts:    Tanner Score is 5.     Right: No swelling, bleeding, inverted nipple, mass, nipple discharge, skin change or tenderness.     Left: No swelling, bleeding, inverted nipple, mass, nipple discharge, skin change or tenderness.  Abdominal:     General: Bowel sounds are normal. There is no distension.     Palpations: Abdomen is soft. There is no mass.     Tenderness: There is no abdominal tenderness. There is no right CVA tenderness, left CVA tenderness, guarding or  rebound.     Hernia: No hernia is present.  Musculoskeletal:        General: No swelling, tenderness, deformity or signs of injury.     Cervical back: Normal range of motion and neck supple. No rigidity or tenderness.     Right lower leg: No edema.     Left lower leg: No edema.  Lymphadenopathy:     Cervical: No cervical adenopathy.     Upper Body:     Right upper body: No supraclavicular, axillary or pectoral adenopathy.     Left upper body: No supraclavicular, axillary or pectoral adenopathy.  Skin:    General: Skin is warm and dry.     Capillary Refill: Capillary refill takes less than 2 seconds.     Coloration: Skin is not jaundiced or pale.     Findings: No bruising, erythema, lesion or rash.  Neurological:     Mental Status: She is alert and oriented to person, place, and time.     Cranial Nerves: No cranial nerve deficit.     Sensory: No sensory deficit.     Motor: No weakness.     Coordination: Coordination normal.     Gait: Gait normal.     Deep Tendon Reflexes: Reflexes normal.  Psychiatric:        Mood and Affect: Mood normal.        Behavior: Behavior normal.         Thought Content: Thought content normal.        Judgment: Judgment normal.     Results for orders placed or performed in visit on 06/06/23  POCT glycosylated hemoglobin (Hb A1C)  Result Value Ref Range   Hemoglobin A1C 7.1 (A) 4.0 - 5.6 %   HbA1c POC (<> result, manual entry)     HbA1c, POC (prediabetic range)     HbA1c, POC (controlled diabetic range)         Assessment & Plan:    Routine Health Maintenance and Physical Exam  Immunization History  Administered Date(s) Administered   Influenza, Seasonal, Injecte, Preservative Fre 10/28/2022   Influenza-Unspecified 11/08/2019   PFIZER([facility]well Top)SARS-COV-2 Vaccination 09/28/2019, 10/20/2019   PPD Test 12/07/2020   Tdap 05/24/2022    Health Maintenance  Topic Date Due   OPHTHALMOLOGY EXAM  10/08/2020   COVID-19 Vaccine (3 - 2024-25 season) 10/09/2022   FOOT EXAM  04/19/2023   Diabetic kidney evaluation - Urine ACR  05/24/2023   Cervical Cancer Screening (HPV/Pap Cotest)  06/05/2024 (Originally 01/20/2023)   Pneumococcal Vaccine 58-46 Years old (1 of 2 - PCV) 06/05/2024 (Originally 02/23/1994)   INFLUENZA VACCINE  09/08/2023   HEMOGLOBIN A1C  12/06/2023   Diabetic kidney evaluation - eGFR measurement  12/07/2023   Fecal DNA (Cologuard)  02/17/2024   DTaP/Tdap/Td (2 - Td or Tdap) 05/23/2032   Hepatitis C Screening  Completed   HIV Screening  Completed   HPV VACCINES  Aged Out   Meningococcal B Vaccine  Aged Out    Discussed health benefits of physical activity, and encouraged her to engage in regular exercise appropriate for her age and condition.  Problem List Items Addressed This Visit       Cardiovascular and Mediastinum   Essential hypertension   Controlled on metoprolol  50 mg daily, spironolactone  25 mg daily, valsartan  80 mg daily, continue current medications DASH diet and commitment to daily physical activity for a minimum of 30 minutes discussed and encouraged, as a part of hypertension management. The  importance of  attaining a healthy weight is also discussed.     06/06/2023    2:32 PM 02/02/2023   11:40 AM 02/02/2023   11:35 AM 02/02/2023   11:33 AM 02/02/2023   11:32 AM 02/02/2023   11:21 AM 02/02/2023   11:18 AM  BP/Weight  Systolic BP 124 125 131 131 133 122 116  Diastolic BP 76 92 76 76 106 64 70  Wt. (Lbs) 215.4        BMI 35.84 kg/m2                 Relevant Medications   atorvastatin  (LIPITOR) 10 MG tablet     Endocrine   Type 2 diabetes mellitus without complication, without long-term current use of insulin (HCC) - Primary   Lab Results  Component Value Date   HGBA1C 7.1 (A) 06/06/2023  Patient counseled on low-carb diet Starting Jardiance 10 mg daily CBG goals provided, up-to-date with diabetic eye exam Follow-up in 4 weeks, diabetic foot exam at next visit      Relevant Medications   empagliflozin (JARDIANCE) 10 MG TABS tablet   atorvastatin  (LIPITOR) 10 MG tablet   Other Relevant Orders   Microalbumin/Creatinine Ratio, Urine   POCT glycosylated hemoglobin (Hb A1C) (Completed)   CBC   CMP14+EGFR     Other   Primary insomnia   Continue trazodone  50 mg at bedtime as needed Home sleep study ordered      Relevant Orders   Home sleep test   Class 2 obesity in adult   Wt Readings from Last 3 Encounters:  06/06/23 215 lb 6.4 oz (97.7 kg)  02/02/23 205 lb (93 kg)  12/05/22 203 lb (92.1 kg)   Body mass index is 35.84 kg/m.  She has added 10 pounds since last weight check Patient counseled on low-carb diet Encouraged engage in regular moderate exercises at least 150 minutes weekly      Relevant Medications   empagliflozin (JARDIANCE) 10 MG TABS tablet   Annual physical exam   Annual exam as documented.  Counseling done include healthy lifestyle involving committing to 150 minutes of exercise per week, heart healthy diet, and attaining healthy weight. The importance of adequate sleep also discussed.  Regular use of seat belt and home safety were  also discussed .Changes in health habits are decided on by patient with goals and time frames set for achieving them. Immunization and cancer screening  needs are specifically addressed at this visit.         Dyslipidemia, goal LDL below 70   Lab Results  Component Value Date   CHOL 132 10/28/2022   HDL 35 (L) 10/28/2022   LDLCALC 68 10/28/2022   LDLDIRECT 71 10/28/2022   TRIG 173 (H) 10/28/2022   CHOLHDL 3.8 10/28/2022  LDL goal is less than 70 She has been out of atorvastatin  Atorvastatin  10 mg daily      Relevant Medications   atorvastatin  (LIPITOR) 10 MG tablet   Other Relevant Orders   Lipid panel   Renal mass   Has upcoming surgery      Other Visit Diagnoses       Screening mammogram for breast cancer       Relevant Orders   MM 3D SCREENING MAMMOGRAM BILATERAL BREAST      Return in about 4 weeks (around 07/04/2023) for FASTING LABS THIS WEEK.     Nesiah Jump Maxwell Duncan Alejandro, FNP

## 2023-06-06 NOTE — Assessment & Plan Note (Signed)
 Lab Results  Component Value Date   CHOL 132 10/28/2022   HDL 35 (L) 10/28/2022   LDLCALC 68 10/28/2022   LDLDIRECT 71 10/28/2022   TRIG 173 (H) 10/28/2022   CHOLHDL 3.8 10/28/2022  LDL goal is less than 70 She has been out of atorvastatin  Atorvastatin  10 mg daily

## 2023-06-06 NOTE — Assessment & Plan Note (Signed)
 Continue trazodone  50 mg at bedtime as needed Home sleep study ordered

## 2023-06-07 ENCOUNTER — Other Ambulatory Visit: Payer: Self-pay

## 2023-06-07 ENCOUNTER — Other Ambulatory Visit: Payer: Self-pay | Admitting: Nurse Practitioner

## 2023-06-07 ENCOUNTER — Other Ambulatory Visit (HOSPITAL_COMMUNITY): Payer: Self-pay

## 2023-06-07 DIAGNOSIS — E119 Type 2 diabetes mellitus without complications: Secondary | ICD-10-CM

## 2023-06-07 LAB — MICROALBUMIN / CREATININE URINE RATIO
Creatinine, Urine: 183.7 mg/dL
Microalb/Creat Ratio: 101 mg/g{creat} — ABNORMAL HIGH (ref 0–29)
Microalbumin, Urine: 185.8 ug/mL

## 2023-06-07 MED ORDER — METFORMIN HCL ER 500 MG PO TB24
ORAL_TABLET | ORAL | 1 refills | Status: AC
  Filled 2023-06-07: qty 180, 90d supply, fill #0
  Filled 2023-10-17: qty 180, 90d supply, fill #1

## 2023-06-08 ENCOUNTER — Other Ambulatory Visit: Payer: Self-pay

## 2023-06-08 ENCOUNTER — Other Ambulatory Visit (HOSPITAL_COMMUNITY): Payer: Self-pay

## 2023-06-08 ENCOUNTER — Other Ambulatory Visit: Payer: Self-pay | Admitting: Cardiovascular Disease

## 2023-06-09 ENCOUNTER — Other Ambulatory Visit (HOSPITAL_COMMUNITY): Payer: Self-pay

## 2023-06-09 ENCOUNTER — Other Ambulatory Visit: Payer: Self-pay

## 2023-06-09 DIAGNOSIS — E119 Type 2 diabetes mellitus without complications: Secondary | ICD-10-CM | POA: Diagnosis not present

## 2023-06-09 DIAGNOSIS — D509 Iron deficiency anemia, unspecified: Secondary | ICD-10-CM | POA: Diagnosis not present

## 2023-06-09 DIAGNOSIS — E785 Hyperlipidemia, unspecified: Secondary | ICD-10-CM

## 2023-06-09 MED ORDER — METOPROLOL SUCCINATE ER 50 MG PO TB24
50.0000 mg | ORAL_TABLET | Freq: Every day | ORAL | 0 refills | Status: DC
Start: 2023-06-09 — End: 2023-06-26
  Filled 2023-06-09: qty 15, 15d supply, fill #0

## 2023-06-09 MED ORDER — SPIRONOLACTONE 25 MG PO TABS
25.0000 mg | ORAL_TABLET | Freq: Every day | ORAL | 0 refills | Status: DC
  Filled 2023-06-09: qty 15, 15d supply, fill #0

## 2023-06-09 MED ORDER — VALSARTAN 80 MG PO TABS
80.0000 mg | ORAL_TABLET | Freq: Every day | ORAL | 0 refills | Status: DC
  Filled 2023-06-09: qty 15, 15d supply, fill #0

## 2023-06-10 LAB — CBC
Hematocrit: 35.4 % (ref 34.0–46.6)
Hemoglobin: 10.5 g/dL — ABNORMAL LOW (ref 11.1–15.9)
MCH: 22.9 pg — ABNORMAL LOW (ref 26.6–33.0)
MCHC: 29.7 g/dL — ABNORMAL LOW (ref 31.5–35.7)
MCV: 77 fL — ABNORMAL LOW (ref 79–97)
Platelets: 458 10*3/uL — ABNORMAL HIGH (ref 150–450)
RBC: 4.58 x10E6/uL (ref 3.77–5.28)
RDW: 14.7 % (ref 11.7–15.4)
WBC: 5.2 10*3/uL (ref 3.4–10.8)

## 2023-06-10 LAB — LIPID PANEL
Chol/HDL Ratio: 3.8 ratio (ref 0.0–4.4)
Cholesterol, Total: 203 mg/dL — ABNORMAL HIGH (ref 100–199)
HDL: 53 mg/dL (ref >39)
LDL Chol Calc (NIH): 126 mg/dL — ABNORMAL HIGH (ref 0–99)
Triglycerides: 134 mg/dL (ref 0–149)
VLDL Cholesterol Cal: 24 mg/dL (ref 5–40)

## 2023-06-10 LAB — CMP14+EGFR
ALT: 19 IU/L (ref 0–32)
AST: 22 IU/L (ref 0–40)
Albumin: 4.2 g/dL (ref 3.9–4.9)
Alkaline Phosphatase: 111 IU/L (ref 44–121)
BUN/Creatinine Ratio: 15 (ref 9–23)
BUN: 12 mg/dL (ref 6–24)
Bilirubin Total: 0.3 mg/dL (ref 0.0–1.2)
CO2: 15 mmol/L — ABNORMAL LOW (ref 20–29)
Calcium: 9.1 mg/dL (ref 8.7–10.2)
Chloride: 103 mmol/L (ref 96–106)
Creatinine, Ser: 0.81 mg/dL (ref 0.57–1.00)
Globulin, Total: 3.6 g/dL (ref 1.5–4.5)
Glucose: 134 mg/dL — ABNORMAL HIGH (ref 70–99)
Potassium: 4.4 mmol/L (ref 3.5–5.2)
Sodium: 140 mmol/L (ref 134–144)
Total Protein: 7.8 g/dL (ref 6.0–8.5)
eGFR: 89 mL/min/{1.73_m2} (ref >59)

## 2023-06-12 ENCOUNTER — Other Ambulatory Visit: Payer: Self-pay | Admitting: Nurse Practitioner

## 2023-06-12 ENCOUNTER — Encounter: Payer: Self-pay | Admitting: Pharmacist

## 2023-06-12 ENCOUNTER — Other Ambulatory Visit (HOSPITAL_COMMUNITY): Payer: Self-pay

## 2023-06-12 ENCOUNTER — Other Ambulatory Visit: Payer: Self-pay

## 2023-06-12 DIAGNOSIS — D509 Iron deficiency anemia, unspecified: Secondary | ICD-10-CM

## 2023-06-13 ENCOUNTER — Other Ambulatory Visit: Payer: Self-pay | Admitting: Nurse Practitioner

## 2023-06-13 ENCOUNTER — Other Ambulatory Visit (HOSPITAL_COMMUNITY): Payer: Self-pay

## 2023-06-13 DIAGNOSIS — D509 Iron deficiency anemia, unspecified: Secondary | ICD-10-CM

## 2023-06-13 DIAGNOSIS — D49512 Neoplasm of unspecified behavior of left kidney: Secondary | ICD-10-CM | POA: Diagnosis not present

## 2023-06-13 LAB — SPECIMEN STATUS REPORT

## 2023-06-13 LAB — IRON,TIBC AND FERRITIN PANEL
Ferritin: 14 ng/mL — ABNORMAL LOW (ref 15–150)
Iron Saturation: 6 % — CL (ref 15–55)
Iron: 31 ug/dL (ref 27–159)
Total Iron Binding Capacity: 507 ug/dL — ABNORMAL HIGH (ref 250–450)
UIBC: 476 ug/dL — ABNORMAL HIGH (ref 131–425)

## 2023-06-13 MED ORDER — FERROUS SULFATE 325 (65 FE) MG PO TBEC
325.0000 mg | DELAYED_RELEASE_TABLET | Freq: Every day | ORAL | 1 refills | Status: AC
  Filled 2023-06-13: qty 90, 90d supply, fill #0
  Filled 2023-10-17: qty 90, 90d supply, fill #1

## 2023-06-21 NOTE — Patient Instructions (Signed)
 SURGICAL WAITING ROOM VISITATION  Patients having surgery or a procedure may have no more than 2 support people in the waiting area - these visitors may rotate.    Children under the age of 1 must have an adult with them who is not the patient.  Due to an increase in RSV and influenza rates and associated hospitalizations, children ages 77 and under may not visit patients in [facility] hospitals.  Visitors with respiratory illnesses are discouraged from visiting and should remain at home.  If the patient needs to stay at the hospital during part of their recovery, the visitor guidelines for inpatient rooms apply. Pre-op nurse will coordinate an appropriate time for 1 support person to accompany patient in pre-op.  This support person may not rotate.    Please refer to the [facility] website for the visitor guidelines for Inpatients (after your surgery is over and you are in a regular room).    Your procedure is scheduled on: 04/27/23   Report to [facility] Main Entrance    Report to admitting at 12:15 PM   Call this number if you have problems the morning of surgery [phone]   Do not eat food :After Midnight.   After Midnight you may have the following liquids until 11:30 AM DAY OF SURGERY  Water Non-Citrus Juices (without pulp, NO RED-Apple, White grape, White cranberry) Black Coffee (NO MILK/CREAM OR CREAMERS, sugar ok)  Clear Tea (NO MILK/CREAM OR CREAMERS, sugar ok) regular and decaf                             Plain Jell-O (NO RED)                                           Fruit ices (not with fruit pulp, NO RED)                                     Popsicles (NO RED)                                                               Sports drinks like Gatorade (NO RED)          If you have questions, please contact your surgeon's office.   FOLLOW BOWEL PREP AND ANY ADDITIONAL PRE OP INSTRUCTIONS YOU RECEIVED FROM YOUR SURGEON'S OFFICE!!!     Oral Hygiene is  also important to reduce your risk of infection.                                    Remember - BRUSH YOUR TEETH THE MORNING OF SURGERY WITH YOUR REGULAR TOOTHPASTE  DENTURES WILL BE REMOVED PRIOR TO SURGERY PLEASE DO NOT APPLY "Poly grip" OR ADHESIVES!!!   Stop all vitamins and herbal supplements 7 days before surgery.   Take these medicines the morning of surgery with A SIP OF WATER: Tylenol, Pravastatin, Tramadol   How to Manage Your Diabetes Before and After  Surgery  Why is it important to control my blood sugar before and after surgery? Improving blood sugar levels before and after surgery helps healing and can limit problems. A way of improving blood sugar control is eating a healthy diet by:  Eating less sugar and carbohydrates  Increasing activity/exercise  Talking with your doctor about reaching your blood sugar goals High blood sugars (greater than 180 mg/dL) can raise your risk of infections and slow your recovery, so you will need to focus on controlling your diabetes during the weeks before surgery. Make sure that the doctor who takes care of your diabetes knows about your planned surgery including the date and location.  How do I manage my blood sugar before surgery? Check your blood sugar at least 4 times a day, starting 2 days before surgery, to make sure that the level is not too high or low. Check your blood sugar the morning of your surgery when you wake up and every 2 hours until you get to the Short Stay unit. If your blood sugar is less than 70 mg/dL, you will need to treat for low blood sugar: Do not take insulin. Treat a low blood sugar (less than 70 mg/dL) with  cup of clear juice (cranberry or apple), 4 glucose tablets, OR glucose gel. Recheck blood sugar in 15 minutes after treatment (to make sure it is greater than 70 mg/dL). If your blood sugar is not greater than 70 mg/dL on recheck, call [phone] for further instructions. Report your blood sugar to the  short stay nurse when you get to Short Stay.  If you are admitted to the hospital after surgery: Your blood sugar will be checked by the staff and you will probably be given insulin after surgery (instead of oral diabetes medicines) to make sure you have good blood sugar levels. The goal for blood sugar control after surgery is 80-180 mg/dL.   WHAT DO I DO ABOUT MY DIABETES MEDICATION?  Do not take oral diabetes medicines (pills) the morning of surgery.  Hold Ozempic for 7 days. Do not take 04/24/23.  DO NOT TAKE THE FOLLOWING 7 DAYS PRIOR TO SURGERY: Ozempic, Wegovy, Rybelsus (Semaglutide), Byetta (exenatide), Bydureon (exenatide ER), Victoza, Saxenda (liraglutide), or Trulicity (dulaglutide) Mounjaro (Tirzepatide) Adlyxin (Lixisenatide), Polyethylene Glycol Loxenatide.  Reviewed and Endorsed by [facility] Patient Education Committee, August 2015                              You may not have any metal on your body including hair pins, jewelry, and body piercing             Do not wear make-up, lotions, powders, perfumes, or deodorant  Do not wear nail polish including gel and S&S, artificial/acrylic nails, or any other type of covering on natural nails including finger and toenails. If you have artificial nails, gel coating, etc. that needs to be removed by a nail salon please have this removed prior to surgery or surgery may need to be canceled/ delayed if the surgeon/ anesthesia feels like they are unable to be safely monitored.   Do not shave  48 hours prior to surgery.    Do not bring valuables to the hospital. [facility] IS NOT             RESPONSIBLE   FOR VALUABLES.   Contacts, glasses, dentures or bridgework may not be worn into surgery.  DO NOT [facility]  MEDICATIONS TO THE HOSPITAL. PHARMACY WILL DISPENSE MEDICATIONS LISTED ON YOUR MEDICATION LIST TO YOU DURING YOUR ADMISSION IN THE HOSPITAL!    Patients discharged on the day of surgery will not be allowed to drive  home.  Someone NEEDS to stay with you for the first 24 hours after anesthesia.              Please read over the following fact sheets you were given: IF YOU HAVE QUESTIONS ABOUT YOUR PRE-OP INSTRUCTIONS PLEASE CALL [phone] [address]   If you received a COVID test during your pre-op visit  it is requested that you wear a mask when out in public, stay away from anyone that may not be feeling well and notify your surgeon if you develop symptoms. If you test positive for Covid or have been in contact with anyone that has tested positive in the last 10 days please notify you surgeon.    [facility] - Preparing for Surgery Before surgery, you can play an important role.  Because skin is not sterile, your skin needs to be as free of germs as possible.  You can reduce the number of germs on your skin by washing with CHG (chlorahexidine gluconate) soap before surgery.  CHG is an antiseptic cleaner which kills germs and bonds with the skin to continue killing germs even after washing. Please DO NOT use if you have an allergy to CHG or antibacterial soaps.  If your skin becomes reddened/irritated stop using the CHG and inform your nurse when you arrive at Short Stay. Do not shave (including legs and underarms) for at least 48 hours prior to the first CHG shower.  You may shave your face/neck.  Please follow these instructions carefully:  1.  Shower with CHG Soap the night before surgery and the  morning of surgery.  2.  If you choose to wash your hair, wash your hair first as usual with your normal  shampoo.  3.  After you shampoo, rinse your hair and body thoroughly to remove the shampoo.                             4.  Use CHG as you would any other liquid soap.  You can apply chg directly to the skin and wash.  Gently with a scrungie or clean washcloth.  5.  Apply the CHG Soap to your body ONLY FROM THE NECK DOWN.   Do   not use on face/ open                           Wound or open sores. Avoid contact  with eyes, ears mouth and   genitals (private parts).                       Wash face,  Genitals (private parts) with your normal soap.             6.  Wash thoroughly, paying special attention to the area where your    surgery  will be performed.  7.  Thoroughly rinse your body with warm water from the neck down.  8.  DO NOT shower/wash with your normal soap after using and rinsing off the CHG Soap.                9.  Pat yourself dry with a clean towel.  10.  Wear clean pajamas.            11.  Place clean sheets on your bed the night of your first shower and do not  sleep with pets. Day of Surgery : Do not apply any lotions/deodorants the morning of surgery.  Please wear clean clothes to the hospital/surgery center.  FAILURE TO FOLLOW THESE INSTRUCTIONS MAY RESULT IN THE CANCELLATION OF YOUR SURGERY  PATIENT SIGNATURE_________________________________  NURSE SIGNATURE__________________________________  ________________________________________________________________________ WHAT IS A BLOOD TRANSFUSION? Blood Transfusion Information  A transfusion is the replacement of blood or some of its parts. Blood is made up of multiple cells which provide different functions. Red blood cells carry oxygen and are used for blood loss replacement. White blood cells fight against infection. Platelets control bleeding. Plasma helps clot blood. Other blood products are available for specialized needs, such as hemophilia or other clotting disorders. BEFORE THE TRANSFUSION  Who gives blood for transfusions?  Healthy volunteers who are fully evaluated to make sure their blood is safe. This is blood bank blood. Transfusion therapy is the safest it has ever been in the practice of medicine. Before blood is taken from a donor, a complete history is taken to make sure that person has no history of diseases nor engages in risky social behavior (examples are intravenous drug use or sexual activity with  multiple partners). The donor's travel history is screened to minimize risk of transmitting infections, such as malaria. The donated blood is tested for signs of infectious diseases, such as HIV and hepatitis. The blood is then tested to be sure it is compatible with you in order to minimize the chance of a transfusion reaction. If you or a relative donates blood, this is often done in anticipation of surgery and is not appropriate for emergency situations. It takes many days to process the donated blood. RISKS AND COMPLICATIONS Although transfusion therapy is very safe and saves many lives, the main dangers of transfusion include:  Getting an infectious disease. Developing a transfusion reaction. This is an allergic reaction to something in the blood you were given. Every precaution is taken to prevent this. The decision to have a blood transfusion has been considered carefully by your caregiver before blood is given. Blood is not given unless the benefits outweigh the risks. AFTER THE TRANSFUSION Right after receiving a blood transfusion, you will usually feel much better and more energetic. This is especially true if your red blood cells have gotten low (anemic). The transfusion raises the level of the red blood cells which carry oxygen, and this usually causes an energy increase. The nurse administering the transfusion will monitor you carefully for complications. HOME CARE INSTRUCTIONS  No special instructions are needed after a transfusion. You may find your energy is better. Speak with your caregiver about any limitations on activity for underlying diseases you may have. SEEK MEDICAL CARE IF:  Your condition is not improving after your transfusion. You develop redness or irritation at the intravenous (IV) site. SEEK IMMEDIATE MEDICAL CARE IF:  Any of the following symptoms occur over the next 12 hours: Shaking chills. You have a temperature by mouth above 102 F (38.9 C), not controlled by  medicine. Chest, back, or muscle pain. People around you feel you are not acting correctly or are confused. Shortness of breath or difficulty breathing. Dizziness and fainting. You get a rash or develop hives. You have a decrease in urine output. Your urine turns a dark color or changes to pink, red,  or brown. Any of the following symptoms occur over the next 10 days: You have a temperature by mouth above 102 F (38.9 C), not controlled by medicine. Shortness of breath. Weakness after normal activity. The white part of the eye turns yellow (jaundice). You have a decrease in the amount of urine or are urinating less often. Your urine turns a dark color or changes to pink, red, or brown. Document Released: 01/22/2000 Document Revised: 04/18/2011 Document Reviewed: 09/10/2007 [facility] Patient Information 2014 [place], [place].  _______________________________________________________________________

## 2023-06-21 NOTE — Progress Notes (Addendum)
 COVID Vaccine Completed: yes  Date of COVID positive in last 90 days:  PCP - Folashade Paseda, FNP Cardiologist - Ralene Burger, MD  Chest x-ray - 12/06/22 Epic  EKG - 06/23/23 Epic/chart Stress Test - n/a ECHO - 06/03/21 Epic Cardiac Cath - n/a Pacemaker/ICD device last checked: n/a Spinal Cord Stimulator: n/a  Bowel Prep - clear liquids the day before. Patient aware  Sleep Study - n/a CPAP -   Fasting Blood Sugar -  Checks Blood Sugar  no checks at home   Last dose of GLP1 agonist-  N/A GLP1 instructions:  Hold 7 days before surgery    Last dose of SGLT-2 inhibitors-  N/A SGLT-2 instructions:  Hold 3 days before surgery    Blood Thinner Instructions:  Last dose: n/a   Time: Aspirin  Instructions: Last Dose:  Activity level: Can go up a flight of stairs and perform activities of daily living without stopping and without symptoms of chest pain or shortness of breath.  Anesthesia review: palpitations, HTN, DM2  Patient denies shortness of breath, fever, cough and chest pain at PAT appointment  Patient verbalized understanding of instructions that were given to them at the PAT appointment. Patient was also instructed that they will need to review over the PAT instructions again at home before surgery.

## 2023-06-23 ENCOUNTER — Encounter (HOSPITAL_COMMUNITY): Payer: Self-pay

## 2023-06-23 ENCOUNTER — Other Ambulatory Visit: Payer: Self-pay

## 2023-06-23 ENCOUNTER — Encounter (HOSPITAL_COMMUNITY)
Admission: RE | Admit: 2023-06-23 | Discharge: 2023-06-23 | Disposition: A | Discharge status: Home / Self Care | Source: Ambulatory Visit | Attending: Urology | Admitting: Urology

## 2023-06-23 ENCOUNTER — Encounter: Payer: Self-pay | Admitting: Nurse Practitioner

## 2023-06-23 VITALS — BP 139/83 | HR 86 | Temp 98.9°F | Resp 14 | Ht 65.0 in | Wt 210.0 lb

## 2023-06-23 DIAGNOSIS — E119 Type 2 diabetes mellitus without complications: Secondary | ICD-10-CM | POA: Insufficient documentation

## 2023-06-23 DIAGNOSIS — Z01818 Encounter for other preprocedural examination: Secondary | ICD-10-CM | POA: Insufficient documentation

## 2023-06-23 DIAGNOSIS — Z01810 Encounter for preprocedural cardiovascular examination: Secondary | ICD-10-CM | POA: Diagnosis present

## 2023-06-23 DIAGNOSIS — Z01812 Encounter for preprocedural laboratory examination: Secondary | ICD-10-CM | POA: Diagnosis present

## 2023-06-23 HISTORY — DX: Iron deficiency: E61.1

## 2023-06-23 HISTORY — DX: Benign neoplasm of bone and articular cartilage, unspecified: D16.9

## 2023-06-23 LAB — TYPE AND SCREEN
ABO/RH(D): O POS
Antibody Screen: NEGATIVE

## 2023-06-23 LAB — GLUCOSE, CAPILLARY: Glucose-Capillary: 144 mg/dL — ABNORMAL HIGH (ref 70–99)

## 2023-06-26 ENCOUNTER — Other Ambulatory Visit: Payer: Self-pay

## 2023-06-26 ENCOUNTER — Telehealth: Payer: Self-pay | Admitting: Cardiovascular Disease

## 2023-06-26 ENCOUNTER — Other Ambulatory Visit (HOSPITAL_COMMUNITY): Payer: Self-pay

## 2023-06-26 MED ORDER — SPIRONOLACTONE 25 MG PO TABS
25.0000 mg | ORAL_TABLET | Freq: Every day | ORAL | 0 refills | Status: DC
Start: 2023-06-26 — End: 2023-08-01
  Filled 2023-06-26: qty 30, 30d supply, fill #0

## 2023-06-26 MED ORDER — METOPROLOL SUCCINATE ER 50 MG PO TB24
50.0000 mg | ORAL_TABLET | Freq: Every day | ORAL | 0 refills | Status: DC
  Filled 2023-06-26: qty 30, 30d supply, fill #0

## 2023-06-26 MED ORDER — VALSARTAN 80 MG PO TABS
80.0000 mg | ORAL_TABLET | Freq: Every day | ORAL | 0 refills | Status: DC
  Filled 2023-06-26: qty 30, 30d supply, fill #0

## 2023-06-26 NOTE — Telephone Encounter (Signed)
*  STAT* If patient is at the pharmacy, call can be transferred to refill team.   1. Which medications need to be refilled? (please list name of each medication and dose if known)   spironolactone  (ALDACTONE ) 25 MG tablet    Take 1 tablet (25 mg total) by mouth daily.  metoprolol  succinate (TOPROL -XL) 50 MG 24 hr tablet  Take 1 tablet (50 mg total) by mouth daily.  valsartan  (DIOVAN ) 80 MG tablet   Take 1 tablet (80 mg total) by mouth daily.   2. Would you like to learn more about the convenience, safety, & potential cost savings by using the [facility]? No   3. Are you open to using the [facility] No   4. Which pharmacy/location (including street and city if local pharmacy) is medication to be sent to? [place] [facility]   5. Do they need a 30 day or 90 day supply? 30 Day Supply  Pt is scheduled for 07/25/23

## 2023-06-26 NOTE — Telephone Encounter (Signed)
 Pt's medications were sent to pt's pharmacy as requested. Confirmation received.

## 2023-07-03 NOTE — Anesthesia Preprocedure Evaluation (Signed)
 Anesthesia Evaluation  Patient identified by MRN, date of birth, ID band Patient awake    Reviewed: Allergy & Precautions, NPO status , Patient's Chart, lab work & pertinent test results  Airway Mallampati: II  TM Distance: >3 FB Neck ROM: Full    Dental  (+) Teeth Intact, Dental Advisory Given   Pulmonary former smoker   Pulmonary exam normal breath sounds clear to auscultation       Cardiovascular hypertension, Pt. on home beta blockers and Pt. on medications Normal cardiovascular exam+ dysrhythmias Supra Ventricular Tachycardia + Valvular Problems/Murmurs MR  Rhythm:Regular Rate:Normal     Neuro/Psych  PSYCHIATRIC DISORDERS Anxiety     negative neurological ROS     GI/Hepatic negative GI ROS, Neg liver ROS,,,  Endo/Other  diabetes, Type 2, Oral Hypoglycemic Agents  Obesity   Renal/GU LEFT RENAL MASS     Musculoskeletal negative musculoskeletal ROS (+)    Abdominal   Peds  Hematology negative hematology ROS (+)   Anesthesia Other Findings Day of surgery medications reviewed with the patient.  Reproductive/Obstetrics                             Anesthesia Physical Anesthesia Plan  ASA: 3  Anesthesia Plan: General   Post-op Pain Management: Tylenol  PO (pre-op)*   Induction: Intravenous  PONV Risk Score and Plan: 4 or greater and Midazolam, Dexamethasone, Ondansetron and Scopolamine patch - Pre-op  Airway Management Planned: Oral ETT  Additional Equipment:   Intra-op Plan:   Post-operative Plan: Extubation in OR  Informed Consent: I have reviewed the patients History and Physical, chart, labs and discussed the procedure including the risks, benefits and alternatives for the proposed anesthesia with the patient or authorized representative who has indicated his/her understanding and acceptance.     Dental advisory given  Plan Discussed with: CRNA  Anesthesia Plan  Comments: (2nd PIV after induction )        Anesthesia Quick Evaluation

## 2023-07-04 ENCOUNTER — Ambulatory Visit (HOSPITAL_COMMUNITY): Payer: Self-pay | Admitting: Physician Assistant

## 2023-07-04 ENCOUNTER — Other Ambulatory Visit: Payer: Self-pay

## 2023-07-04 ENCOUNTER — Other Ambulatory Visit (HOSPITAL_COMMUNITY): Payer: Self-pay

## 2023-07-04 ENCOUNTER — Encounter (HOSPITAL_COMMUNITY): Payer: Self-pay | Admitting: Urology

## 2023-07-04 ENCOUNTER — Encounter (HOSPITAL_COMMUNITY)
Admission: RE | Disposition: A | Discharge status: Home / Self Care | Payer: Self-pay | Source: Ambulatory Visit | Attending: Urology

## 2023-07-04 ENCOUNTER — Inpatient Hospital Stay (HOSPITAL_COMMUNITY)
Admission: RE | Admit: 2023-07-04 | Discharge: 2023-07-06 | DRG: 657 | Disposition: A | Discharge status: Home / Self Care | Source: Ambulatory Visit | Attending: Urology | Admitting: Urology

## 2023-07-04 ENCOUNTER — Ambulatory Visit (HOSPITAL_COMMUNITY): Payer: Self-pay | Admitting: Anesthesiology

## 2023-07-04 DIAGNOSIS — Z794 Long term (current) use of insulin: Secondary | ICD-10-CM | POA: Diagnosis not present

## 2023-07-04 DIAGNOSIS — Z833 Family history of diabetes mellitus: Secondary | ICD-10-CM

## 2023-07-04 DIAGNOSIS — Z79899 Other long term (current) drug therapy: Secondary | ICD-10-CM

## 2023-07-04 DIAGNOSIS — E119 Type 2 diabetes mellitus without complications: Secondary | ICD-10-CM

## 2023-07-04 DIAGNOSIS — E785 Hyperlipidemia, unspecified: Secondary | ICD-10-CM | POA: Diagnosis present

## 2023-07-04 DIAGNOSIS — I10 Essential (primary) hypertension: Secondary | ICD-10-CM | POA: Diagnosis not present

## 2023-07-04 DIAGNOSIS — N2889 Other specified disorders of kidney and ureter: Secondary | ICD-10-CM | POA: Diagnosis not present

## 2023-07-04 DIAGNOSIS — Z6836 Body mass index (BMI) 36.0-36.9, adult: Secondary | ICD-10-CM

## 2023-07-04 DIAGNOSIS — C642 Malignant neoplasm of left kidney, except renal pelvis: Secondary | ICD-10-CM | POA: Diagnosis not present

## 2023-07-04 DIAGNOSIS — D509 Iron deficiency anemia, unspecified: Secondary | ICD-10-CM | POA: Diagnosis present

## 2023-07-04 DIAGNOSIS — D3002 Benign neoplasm of left kidney: Secondary | ICD-10-CM | POA: Diagnosis not present

## 2023-07-04 DIAGNOSIS — J9811 Atelectasis: Secondary | ICD-10-CM | POA: Diagnosis not present

## 2023-07-04 DIAGNOSIS — Z8249 Family history of ischemic heart disease and other diseases of the circulatory system: Secondary | ICD-10-CM

## 2023-07-04 DIAGNOSIS — Z7984 Long term (current) use of oral hypoglycemic drugs: Secondary | ICD-10-CM

## 2023-07-04 DIAGNOSIS — Z87891 Personal history of nicotine dependence: Secondary | ICD-10-CM

## 2023-07-04 DIAGNOSIS — E669 Obesity, unspecified: Secondary | ICD-10-CM | POA: Diagnosis present

## 2023-07-04 DIAGNOSIS — E7800 Pure hypercholesterolemia, unspecified: Secondary | ICD-10-CM | POA: Diagnosis present

## 2023-07-04 DIAGNOSIS — D49512 Neoplasm of unspecified behavior of left kidney: Secondary | ICD-10-CM | POA: Diagnosis not present

## 2023-07-04 DIAGNOSIS — Z820 Family history of epilepsy and other diseases of the nervous system: Secondary | ICD-10-CM

## 2023-07-04 HISTORY — PX: ROBOTIC ASSITED PARTIAL NEPHRECTOMY: SHX6087

## 2023-07-04 LAB — GLUCOSE, CAPILLARY
Glucose-Capillary: 164 mg/dL — ABNORMAL HIGH (ref 70–99)
Glucose-Capillary: 171 mg/dL — ABNORMAL HIGH (ref 70–99)
Glucose-Capillary: 193 mg/dL — ABNORMAL HIGH (ref 70–99)
Glucose-Capillary: 167 mg/dL — ABNORMAL HIGH (ref 70–99)
Glucose-Capillary: 211 mg/dL — ABNORMAL HIGH (ref 70–99)
Glucose-Capillary: 156 mg/dL — ABNORMAL HIGH (ref 70–99)

## 2023-07-04 LAB — POCT PREGNANCY, URINE: Preg Test, Ur: NEGATIVE

## 2023-07-04 LAB — ABO/RH: ABO/RH(D): O POS

## 2023-07-04 LAB — HEMOGLOBIN AND HEMATOCRIT, BLOOD
HCT: 34.1 % — ABNORMAL LOW (ref 36.0–46.0)
Hemoglobin: 9.9 g/dL — ABNORMAL LOW (ref 12.0–15.0)

## 2023-07-04 SURGERY — NEPHRECTOMY, PARTIAL, ROBOT-ASSISTED
Anesthesia: General | Laterality: Left

## 2023-07-04 MED ORDER — BUPIVACAINE LIPOSOME 1.3 % IJ SUSP
INTRAMUSCULAR | Status: AC
  Filled 2023-07-04: qty 20

## 2023-07-04 MED ORDER — AMISULPRIDE (ANTIEMETIC) 5 MG/2ML IV SOLN: 10.0000 mg | Freq: Once | INTRAVENOUS | Status: DC | PRN

## 2023-07-04 MED ORDER — ONDANSETRON HCL 4 MG/2ML IJ SOLN
INTRAMUSCULAR | Status: AC
Start: 2023-07-04 — End: ?
  Filled 2023-07-04: qty 2

## 2023-07-04 MED ORDER — ROCURONIUM BROMIDE 10 MG/ML (PF) SYRINGE
PREFILLED_SYRINGE | INTRAVENOUS | Status: AC
  Filled 2023-07-04: qty 10

## 2023-07-04 MED ORDER — OXYCODONE HCL 5 MG PO TABS
5.0000 mg | ORAL_TABLET | ORAL | Status: DC | PRN
  Administered 2023-07-04 – 2023-07-06 (×9): 5 mg via ORAL
  Filled 2023-07-04 (×9): qty 1

## 2023-07-04 MED ORDER — DIPHENHYDRAMINE HCL 12.5 MG/5ML PO ELIX: 12.5000 mg | ORAL_SOLUTION | Freq: Four times a day (QID) | ORAL | Status: DC | PRN

## 2023-07-04 MED ORDER — SODIUM CHLORIDE (PF) 0.9 % IJ SOLN
INTRAMUSCULAR | Status: AC
  Filled 2023-07-04: qty 20

## 2023-07-04 MED ORDER — ATORVASTATIN CALCIUM 10 MG PO TABS
10.0000 mg | ORAL_TABLET | Freq: Every day | ORAL | Status: DC
  Administered 2023-07-05 – 2023-07-06 (×2): 10 mg via ORAL
  Filled 2023-07-04 (×2): qty 1

## 2023-07-04 MED ORDER — FENTANYL CITRATE (PF) 100 MCG/2ML IJ SOLN
INTRAMUSCULAR | Status: DC | PRN
  Administered 2023-07-04: 100 ug via INTRAVENOUS
  Administered 2023-07-04 (×3): 50 ug via INTRAVENOUS

## 2023-07-04 MED ORDER — CHLORHEXIDINE GLUCONATE 0.12 % MT SOLN
15.0000 mL | Freq: Once | OROMUCOSAL | Status: AC
  Administered 2023-07-04: 15 mL via OROMUCOSAL

## 2023-07-04 MED ORDER — LACTATED RINGERS IV SOLN: INTRAVENOUS | Status: DC | PRN

## 2023-07-04 MED ORDER — DEXAMETHASONE SODIUM PHOSPHATE 10 MG/ML IJ SOLN
INTRAMUSCULAR | Status: AC
  Filled 2023-07-04: qty 1

## 2023-07-04 MED ORDER — VISTASEAL 4 ML SINGLE DOSE KIT
4.0000 mL | PACK | Freq: Once | CUTANEOUS | Status: AC
  Administered 2023-07-04: 4 mL via TOPICAL
  Filled 2023-07-04: qty 4

## 2023-07-04 MED ORDER — ACETAMINOPHEN 500 MG PO TABS
1000.0000 mg | ORAL_TABLET | Freq: Four times a day (QID) | ORAL | Status: AC
  Administered 2023-07-04 – 2023-07-05 (×4): 1000 mg via ORAL
  Filled 2023-07-04 (×4): qty 2

## 2023-07-04 MED ORDER — MIDAZOLAM HCL 5 MG/5ML IJ SOLN
INTRAMUSCULAR | Status: DC | PRN
  Administered 2023-07-04: 2 mg via INTRAVENOUS

## 2023-07-04 MED ORDER — FERROUS SULFATE 325 (65 FE) MG PO TBEC: 325.0000 mg | DELAYED_RELEASE_TABLET | Freq: Every day | ORAL | Status: DC

## 2023-07-04 MED ORDER — LACTATED RINGERS IV SOLN: INTRAVENOUS | Status: DC

## 2023-07-04 MED ORDER — ONDANSETRON HCL 4 MG/2ML IJ SOLN
INTRAMUSCULAR | Status: AC
  Filled 2023-07-04: qty 2

## 2023-07-04 MED ORDER — ACETAMINOPHEN 500 MG PO TABS
1000.0000 mg | ORAL_TABLET | Freq: Once | ORAL | Status: AC
  Administered 2023-07-04: 1000 mg via ORAL
  Filled 2023-07-04: qty 2

## 2023-07-04 MED ORDER — HYDROCODONE-ACETAMINOPHEN 5-325 MG PO TABS
1.0000 | ORAL_TABLET | Freq: Four times a day (QID) | ORAL | 0 refills | Status: DC | PRN
  Filled 2023-07-04: qty 20, 3d supply, fill #0

## 2023-07-04 MED ORDER — FERROUS SULFATE 325 (65 FE) MG PO TABS
325.0000 mg | ORAL_TABLET | Freq: Every day | ORAL | Status: DC
  Administered 2023-07-05 – 2023-07-06 (×2): 325 mg via ORAL
  Filled 2023-07-04 (×2): qty 1

## 2023-07-04 MED ORDER — CEFAZOLIN SODIUM-DEXTROSE 2-4 GM/100ML-% IV SOLN
2.0000 g | INTRAVENOUS | Status: AC
  Administered 2023-07-04: 2 g via INTRAVENOUS
  Filled 2023-07-04: qty 100

## 2023-07-04 MED ORDER — ONDANSETRON HCL 4 MG/2ML IJ SOLN
INTRAMUSCULAR | Status: DC | PRN
Start: 2023-07-04 — End: 2023-07-04
  Administered 2023-07-04: 4 mg via INTRAVENOUS

## 2023-07-04 MED ORDER — ONDANSETRON HCL 4 MG/2ML IJ SOLN: 4.0000 mg | INTRAMUSCULAR | Status: DC | PRN

## 2023-07-04 MED ORDER — FENTANYL CITRATE PF 50 MCG/ML IJ SOSY
25.0000 ug | PREFILLED_SYRINGE | INTRAMUSCULAR | Status: DC | PRN
  Administered 2023-07-04: 50 ug via INTRAVENOUS

## 2023-07-04 MED ORDER — HYDROMORPHONE HCL 1 MG/ML IJ SOLN: 0.5000 mg | INTRAMUSCULAR | Status: DC | PRN

## 2023-07-04 MED ORDER — DEXAMETHASONE SODIUM PHOSPHATE 10 MG/ML IJ SOLN
INTRAMUSCULAR | Status: DC | PRN
  Administered 2023-07-04: 5 mg via INTRAVENOUS

## 2023-07-04 MED ORDER — SODIUM CHLORIDE 0.45 % IV SOLN: INTRAVENOUS | Status: DC

## 2023-07-04 MED ORDER — DIPHENHYDRAMINE HCL 50 MG/ML IJ SOLN: 12.5000 mg | Freq: Four times a day (QID) | INTRAMUSCULAR | Status: DC | PRN

## 2023-07-04 MED ORDER — SUGAMMADEX SODIUM 200 MG/2ML IV SOLN
INTRAVENOUS | Status: DC | PRN
  Administered 2023-07-04: 190.6 mg via INTRAVENOUS

## 2023-07-04 MED ORDER — DOCUSATE SODIUM 100 MG PO CAPS
100.0000 mg | ORAL_CAPSULE | Freq: Two times a day (BID) | ORAL | Status: DC
  Administered 2023-07-04 – 2023-07-06 (×4): 100 mg via ORAL
  Filled 2023-07-04 (×4): qty 1

## 2023-07-04 MED ORDER — SCOPOLAMINE 1 MG/3DAYS TD PT72
1.0000 | MEDICATED_PATCH | Freq: Once | TRANSDERMAL | Status: DC
  Administered 2023-07-04: 1.5 mg via TRANSDERMAL
  Filled 2023-07-04: qty 1

## 2023-07-04 MED ORDER — INSULIN ASPART 100 UNIT/ML IJ SOLN
0.0000 [IU] | Freq: Three times a day (TID) | INTRAMUSCULAR | Status: DC
  Administered 2023-07-04: 5 [IU] via SUBCUTANEOUS
  Administered 2023-07-05: 3 [IU] via SUBCUTANEOUS
  Administered 2023-07-05: 5 [IU] via SUBCUTANEOUS
  Administered 2023-07-05: 2 [IU] via SUBCUTANEOUS
  Administered 2023-07-06 (×2): 3 [IU] via SUBCUTANEOUS

## 2023-07-04 MED ORDER — LIDOCAINE HCL (PF) 2 % IJ SOLN
INTRAMUSCULAR | Status: AC
  Filled 2023-07-04: qty 5

## 2023-07-04 MED ORDER — LIDOCAINE HCL (CARDIAC) PF 100 MG/5ML IV SOSY
PREFILLED_SYRINGE | INTRAVENOUS | Status: DC | PRN
  Administered 2023-07-04: 100 mg via INTRAVENOUS

## 2023-07-04 MED ORDER — SODIUM CHLORIDE 0.9 % IV SOLN: 6.2500 mg | INTRAVENOUS | Status: DC

## 2023-07-04 MED ORDER — PROPOFOL 10 MG/ML IV BOLUS
INTRAVENOUS | Status: AC
  Filled 2023-07-04: qty 20

## 2023-07-04 MED ORDER — PHENYLEPHRINE HCL-NACL 20-0.9 MG/250ML-% IV SOLN
INTRAVENOUS | Status: DC | PRN
  Administered 2023-07-04: 25 ug/min via INTRAVENOUS

## 2023-07-04 MED ORDER — PROMETHAZINE (PHENERGAN) 6.25MG IN NS 50ML IVPB
6.2500 mg | INTRAVENOUS | Status: DC | PRN
  Administered 2023-07-04: 6.25 mg via INTRAVENOUS
  Filled 2023-07-04: qty 6.25

## 2023-07-04 MED ORDER — PROPOFOL 10 MG/ML IV BOLUS
INTRAVENOUS | Status: DC | PRN
  Administered 2023-07-04: 150 mg via INTRAVENOUS

## 2023-07-04 MED ORDER — DOCUSATE SODIUM 100 MG PO CAPS: 100.0000 mg | ORAL_CAPSULE | Freq: Two times a day (BID) | ORAL | Status: DC

## 2023-07-04 MED ORDER — HYDRALAZINE HCL 20 MG/ML IJ SOLN: 5.0000 mg | Freq: Four times a day (QID) | INTRAMUSCULAR | Status: DC | PRN

## 2023-07-04 MED ORDER — STERILE WATER FOR IRRIGATION IR SOLN
Status: DC | PRN
  Administered 2023-07-04: 1000 mL

## 2023-07-04 MED ORDER — HYOSCYAMINE SULFATE 0.125 MG SL SUBL: 0.1250 mg | SUBLINGUAL_TABLET | SUBLINGUAL | Status: DC | PRN

## 2023-07-04 MED ORDER — SODIUM CHLORIDE (PF) 0.9 % IJ SOLN
INTRAMUSCULAR | Status: DC | PRN
  Administered 2023-07-04: 40 mL

## 2023-07-04 MED ORDER — MIDAZOLAM HCL 2 MG/2ML IJ SOLN
INTRAMUSCULAR | Status: AC
  Filled 2023-07-04: qty 2

## 2023-07-04 MED ORDER — ORAL CARE MOUTH RINSE: 15.0000 mL | Freq: Once | OROMUCOSAL | Status: AC

## 2023-07-04 MED ORDER — PHENYLEPHRINE 80 MCG/ML (10ML) SYRINGE FOR IV PUSH (FOR BLOOD PRESSURE SUPPORT)
PREFILLED_SYRINGE | INTRAVENOUS | Status: DC | PRN
Start: 2023-07-04 — End: 2023-07-04
  Administered 2023-07-04 (×2): 80 ug via INTRAVENOUS

## 2023-07-04 MED ORDER — PHENYLEPHRINE 80 MCG/ML (10ML) SYRINGE FOR IV PUSH (FOR BLOOD PRESSURE SUPPORT)
PREFILLED_SYRINGE | INTRAVENOUS | Status: AC
  Filled 2023-07-04: qty 10

## 2023-07-04 MED ORDER — GABAPENTIN 300 MG PO CAPS
300.0000 mg | ORAL_CAPSULE | Freq: Once | ORAL | Status: AC
  Administered 2023-07-04: 300 mg via ORAL
  Filled 2023-07-04: qty 1

## 2023-07-04 MED ORDER — FENTANYL CITRATE PF 50 MCG/ML IJ SOSY
PREFILLED_SYRINGE | INTRAMUSCULAR | Status: AC
  Filled 2023-07-04: qty 3

## 2023-07-04 MED ORDER — FENTANYL CITRATE (PF) 250 MCG/5ML IJ SOLN
INTRAMUSCULAR | Status: AC
  Filled 2023-07-04: qty 5

## 2023-07-04 MED ORDER — ROCURONIUM BROMIDE 100 MG/10ML IV SOLN
INTRAVENOUS | Status: DC | PRN
  Administered 2023-07-04: 20 mg via INTRAVENOUS
  Administered 2023-07-04: 70 mg via INTRAVENOUS
  Administered 2023-07-04: 30 mg via INTRAVENOUS

## 2023-07-04 MED ORDER — INSULIN ASPART 100 UNIT/ML IJ SOLN
0.0000 [IU] | INTRAMUSCULAR | Status: AC | PRN
  Administered 2023-07-04 (×2): 2 [IU] via SUBCUTANEOUS
  Filled 2023-07-04 (×2): qty 1

## 2023-07-04 MED ORDER — METOPROLOL SUCCINATE ER 50 MG PO TB24
50.0000 mg | ORAL_TABLET | Freq: Every day | ORAL | Status: DC
  Administered 2023-07-05 – 2023-07-06 (×2): 50 mg via ORAL
  Filled 2023-07-04 (×2): qty 1

## 2023-07-04 SURGICAL SUPPLY — 68 items
APPLICATOR SURGIFLO ENDO (HEMOSTASIS) IMPLANT
APPLICATOR VISTASEAL 35 (MISCELLANEOUS) ×1 IMPLANT
BAG COUNTER SPONGE SURGICOUNT (BAG) IMPLANT
CAUTERY HOOK MNPLR 1.6 DVNC XI (INSTRUMENTS) ×1 IMPLANT
CHLORAPREP W/TINT 26 (MISCELLANEOUS) ×1 IMPLANT
CLIP LIGATING HEM O LOK PURPLE (MISCELLANEOUS) ×2 IMPLANT
CLIP LIGATING HEMO LOK XL GOLD (MISCELLANEOUS) IMPLANT
CLIP LIGATING HEMO O LOK GREEN (MISCELLANEOUS) IMPLANT
CLIP SUT LAPRA TY ABSORB (SUTURE) ×2 IMPLANT
COVER SURGICAL LIGHT HANDLE (MISCELLANEOUS) ×1 IMPLANT
COVER TIP SHEARS 8 DVNC (MISCELLANEOUS) ×1 IMPLANT
DERMABOND ADVANCED .7 DNX12 (GAUZE/BANDAGES/DRESSINGS) ×1 IMPLANT
DRAIN CHANNEL 15F RND FF 3/16 (WOUND CARE) IMPLANT
DRAPE ARM DVNC X/XI (DISPOSABLE) ×4 IMPLANT
DRAPE COLUMN DVNC XI (DISPOSABLE) ×1 IMPLANT
DRAPE INCISE IOBAN 66X45 STRL (DRAPES) ×1 IMPLANT
DRAPE SHEET LG 3/4 BI-LAMINATE (DRAPES) ×1 IMPLANT
DRIVER NDL LRG 8 DVNC XI (INSTRUMENTS) ×3 IMPLANT
DRIVER NDLE LRG 8 DVNC XI (INSTRUMENTS) ×3 IMPLANT
DRSG TEGADERM 4X4.75 (GAUZE/BANDAGES/DRESSINGS) IMPLANT
ELECT PENCIL ROCKER SW 15FT (MISCELLANEOUS) ×1 IMPLANT
ELECT REM PT RETURN 15FT ADLT (MISCELLANEOUS) ×1 IMPLANT
EVACUATOR SILICONE 100CC (DRAIN) IMPLANT
FORCEPS PROGRASP DVNC XI (FORCEP) ×2 IMPLANT
GAUZE 4X4 16PLY ~~LOC~~+RFID DBL (SPONGE) IMPLANT
GLOVE BIO SURGEON STRL SZ 6.5 (GLOVE) ×1 IMPLANT
GLOVE BIOGEL PI IND STRL 8 (GLOVE) ×1 IMPLANT
GLOVE SURG LX STRL 8.0 MICRO (GLOVE) ×2 IMPLANT
GOWN STRL REUS W/ TWL XL LVL3 (GOWN DISPOSABLE) ×2 IMPLANT
GOWN STRL SURGICAL XL XLNG (GOWN DISPOSABLE) ×1 IMPLANT
HEMOSTAT SURGICEL 4X8 (HEMOSTASIS) ×1 IMPLANT
HOLDER FOLEY CATH W/STRAP (MISCELLANEOUS) ×1 IMPLANT
IRRIGATION SUCT STRKRFLW 2 WTP (MISCELLANEOUS) ×1 IMPLANT
KIT BASIN OR (CUSTOM PROCEDURE TRAY) ×1 IMPLANT
KIT TURNOVER KIT A (KITS) IMPLANT
LOOP VESSEL MAXI BLUE (MISCELLANEOUS) IMPLANT
MARKER SKIN DUAL TIP RULER LAB (MISCELLANEOUS) ×1 IMPLANT
NDL INSUFFLATION 14GA 120MM (NEEDLE) ×1 IMPLANT
NEEDLE INSUFFLATION 14GA 120MM (NEEDLE) ×1 IMPLANT
PAD POSITIONING PINK XL (MISCELLANEOUS) ×1 IMPLANT
PROTECTOR NERVE ULNAR (MISCELLANEOUS) ×2 IMPLANT
RELOAD STAPLE 45 2.6 WHT THIN (STAPLE) IMPLANT
SCISSORS LAP 5X45 EPIX DISP (ENDOMECHANICALS) ×1 IMPLANT
SCISSORS MNPLR CVD DVNC XI (INSTRUMENTS) ×1 IMPLANT
SEAL UNIV 5-12 XI (MISCELLANEOUS) ×4 IMPLANT
SET TUBE SMOKE EVAC HIGH FLOW (TUBING) ×1 IMPLANT
SOLUTION ELECTROSURG ANTI STCK (MISCELLANEOUS) ×1 IMPLANT
SPIKE FLUID TRANSFER (MISCELLANEOUS) ×1 IMPLANT
STAPLE RELOAD 45 WHT (STAPLE) IMPLANT
STAPLER POWER ECHELON 45 WIDE (STAPLE) IMPLANT
SURGIFLO W/THROMBIN 8M KIT (HEMOSTASIS) IMPLANT
SUT ETHILON 2 0 PS N (SUTURE) IMPLANT
SUT MNCRL AB 4-0 PS2 18 (SUTURE) ×2 IMPLANT
SUT PDS AB 0 CT1 36 (SUTURE) IMPLANT
SUT STRATAFIX SPIRAL PDS3-0 (SUTURE) ×1 IMPLANT
SUT VIC AB 1 CT1 36 (SUTURE) ×4 IMPLANT
SUT VIC AB 2-0 SH 27X BRD (SUTURE) IMPLANT
SUT VICRYL 0 UR6 27IN ABS (SUTURE) IMPLANT
SUT VLOC 3-0 9IN GRN (SUTURE) IMPLANT
SUTURE V-LC BRB 180 2/0GR6GS22 (SUTURE) IMPLANT
SUTURE VLOC BRB 180 ABS3/0GR12 (SUTURE) ×1 IMPLANT
SYSTEM BAG RETRIEVAL 10MM (BASKET) ×1 IMPLANT
TOWEL OR 17X26 10 PK STRL BLUE (TOWEL DISPOSABLE) ×1 IMPLANT
TRAY FOLEY MTR SLVR 16FR STAT (SET/KITS/TRAYS/PACK) ×1 IMPLANT
TRAY LAPAROSCOPIC (CUSTOM PROCEDURE TRAY) ×1 IMPLANT
TROCAR Z THREAD OPTICAL 12X100 (TROCAR) ×1 IMPLANT
TROCAR Z-THREAD OPTICAL 5X100M (TROCAR) IMPLANT
WATER STERILE IRR 1000ML POUR (IV SOLUTION) ×1 IMPLANT

## 2023-07-04 NOTE — Discharge Instructions (Signed)
 Activity:  You are encouraged to ambulate frequently (about every hour during waking hours) to help prevent blood clots from forming in your legs or lungs.  However, you should not engage in any heavy lifting (> 10-15 lbs), strenuous activity, or straining. Diet: You should advance your diet as instructed by your physician.  It will be normal to have some bloating, nausea, and abdominal discomfort intermittently. Prescriptions:  You will be provided a prescription for pain medication to take as needed.  If your pain is not severe enough to require the prescription pain medication, you may take extra strength Tylenol instead which will have less side effects.  You should also take a prescribed stool softener to avoid straining with bowel movements as the prescription pain medication may constipate you. Incisions: You may remove your dressing bandages 48 hours after surgery if not removed in the hospital.  You will either have some small staples or special tissue glue at each of the incision sites. Once the bandages are removed (if present), the incisions may stay open to air.  You may start showering (but not soaking or bathing in water) the 2nd day after surgery and the incisions simply need to be patted dry after the shower.  No additional care is needed. What to call us about: You should call the office [phone]) if you develop fever > 101 or develop persistent vomiting.   You may resume aspirin, advil, aleve, vitamins, and supplements 7 days after surgery.

## 2023-07-04 NOTE — Anesthesia Postprocedure Evaluation (Signed)
 Anesthesia Post Note  Patient: Kaylee Maxwell  Procedure(s) Performed: XI ROBOTIC ASSITED LEFT  PARTIAL NEPHRECTOMY (Left)     Patient location during evaluation: PACU Anesthesia Type: General Level of consciousness: awake and alert Pain management: pain level controlled Vital Signs Assessment: post-procedure vital signs reviewed and stable Respiratory status: spontaneous breathing, nonlabored ventilation, respiratory function stable and patient connected to nasal cannula oxygen Cardiovascular status: blood pressure returned to baseline and stable Postop Assessment: no apparent nausea or vomiting Anesthetic complications: no   No notable events documented.  Last Vitals:    Last Pain:                 Erin Havers

## 2023-07-04 NOTE — Op Note (Signed)
 Operative Note  Preoperative diagnosis:  1.  3 cm left renal mass  Postoperative diagnosis: 1.  3 cm left renal mass  Procedure(s): 1.  Robot-assisted laparoscopic left partial nephrectomy 2.  Intraoperative ultrasound of single retroperitoneal organ  Surgeon: Yevonne Heman, MD  Assistants: Carrolyn Clan, PA-C  Anesthesia:  General  Complications:  None  EBL: 280 mL  Specimens: 1.  Left renal mass  Drains/Catheters: 1.  Left lower quadrant JP drain 2.  16 French Foley catheter  Intraoperative findings:   Grossly negative margins following excision of left posterior lower pole renal mass The left renorrhaphy was hemostatic at the conclusion of the case 2 renal arteries and single lower pole renal vein  Indication:  Kaylee Maxwell is a 48 y.o. female with a solid and enhancing 3 cm left renal mass with features concerning for renal cell carcinoma.  She has been consented for the above procedures, voices understanding and wishes to proceed.  Description of procedure:  After informed consent was obtained, the patient was brought to the operating room and general endotracheal anesthesia was administered.  A 16 French Foley catheter was then sterilely placed and set to gravity drainage.  The patient was then placed in the right lateral decubitus position and prepped and draped in usual sterile fashion.  A timeout was performed.  An 8 mm incision was then made lateral to the left rectus muscle at the level of the left 12th rib.  Abdominal access was obtained via a Veress needle.  The abdominal cavity was then insufflated up to 15 mmHg.  An 8 mm port was then introduced into the abdominal cavity.  Inspection of the port entry site by the robotic camera revealed no adjacent organ injury.  We then placed 3 additional 8 mm robotic ports to triangulate the left renal hilum.  A 12 mm assistant port was then placed between the carmera port and 3rd robotic arm.  The white line of  Toldt along the descending colon was incised sharply and the colon, along with its mesocolonic fat, was reflected medially until the aorta was identified.  We then made a small window adjacent to the lower pole of the left kidney, identifying the left psoas muscle, left ureter and left gonadal vein.  The left ureter and gonadal vein were then reflected anteriorly allowing us  to then incised the perihilar attachments using electrocautery.  We encountered a small lumbar vein adjacent to the insertion of the left gonadal vein into the left renal vein.  This lumbar vein was ligated with hemo-lock clips in 2 places and incised sharply.  This provided us  excellent exposure to the left renal hilum.  The perilymphatic tissue surrounding the left renal artery was carefully dissected away, creating a window to place a bulldog clamp later in the procedure.  The anterior portion of Gerota's fascia was incised, allowing reflection the perinephric fat medially and laterally until there was adequate exposure of the posterior lower pole left renal mass.  Intraoperative ultrasound confirmed the heterogenous echogenicity of the lesion compared to the remainder of the renal parenchyma and allowed identification of the depth/borders of the mass, which were demarcated using electrocautery along the renal capsule.  We then exposed the left renal artery and placed a bulldog clamp, marking warm ischemia time.  The left kidney immediately became ischemic and pale in appearance.  The left renal mass was then sharply excised with minimal blood loss.  After the mass was free, it was placed in the left  upper quadrant to be retrieved later on during the operation.    The renorrhaphy was then performed using a 3-0 V-lock in the deep layer of the renal parenchyma.  The bulldog clamp was then removed marking warm ischemia time at 23 minutes.   A series of 1-0 Vicryl sutures with Hem-o-lok clips acting as a buttress were then used to  reapproximate the renal capsule.  There did not appear to be any obvious bleeding around the renal hilum nor surrounding our repair.  There was a small tear in the anterior upper pole of the renal capsule that was repaired with a Vicryl suture in a horizontal 1-0 mattress fashion, which achieved excellent hemostasis.  The incised Gerota's fascia overlying the mass was then reapproximated using a running 2-0 V lock suture.  The mass was then placed in an Endo Catch bag.  Surgicel and Vistaseal were then applied to the resection bed.  A JP drain was then inserted into the abdominal cavity adjacent to the left kidney through the left lower quadrant incision and secured in place using 2-0 nylon suture.  The robot was then de-docked and the camera was then reinserted into the assistant port. Laparoscopic graspers were then used to grab the string of the Endo Catch bag, which was brought out through the 12 mm assistant port.  The abdomen was then desufflated and all ports were removed.  The assistant port incision was then extended approximately 1-2 cm and the left renal mass, within the Endo Catch bag, was removed and sent to pathology for permanent section.  The fascia within the assistant port incision was then reapproximated using a 0 Vicryl suture.  The remainder of the incisions were then closed using 4-0 Monocryl and dressed appropriately.  Patient tolerated the procedure well and was transferred to the postanesthesia unit in stable condition.   Plan: Monitor on the floor overnight

## 2023-07-04 NOTE — Transfer of Care (Signed)
 Immediate Anesthesia Transfer of Care Note  Patient: Kaylee Maxwell  Procedure(s) Performed: XI ROBOTIC ASSITED LEFT  PARTIAL NEPHRECTOMY (Left)  Patient Location: PACU  Anesthesia Type:General  Level of Consciousness: awake, alert , and oriented  Airway & Oxygen Therapy: Patient Spontanous Breathing  Post-op Assessment: Report given to RN and Post -op Vital signs reviewed and stable  Post vital signs: Reviewed and stable  Last Vitals:  Vitals Value Taken Time  BP 145/90 07/04/23 1105  Temp 36.7 C 07/04/23 1105  Pulse 91 07/04/23 1107  Resp 16 07/04/23 1107  SpO2 100 % 07/04/23 1107  Vitals shown include unfiled device data.  Last Pain:  Vitals:   07/04/23 0634  TempSrc:   PainSc: 0-No pain      Patients Stated Pain Goal: 5 (07/04/23 0626)  Complications: No notable events documented.

## 2023-07-04 NOTE — H&P (Signed)
 Office Visit Report     06/13/2023   --------------------------------------------------------------------------------   Kaylee Maxwell  MRN: [id number]  DOB: 27-Jan-1976, 48 year old Female  SSN:    PRIMARY CARE:     REFERRING:    PROVIDER:  Yevonne Heman, M.D.  TREATING:  [place], [place]  LOCATION:  [facility] [phone]     --------------------------------------------------------------------------------   CC/HPI: Pt presents today for pre-operative history and physical exam in anticipation of robotic assisted lap left partial nephrectomy by Dr. Sherrine Dolly on 07/04/23. She is doing well and is without complaint.   Pt denies F/C, HA, CP, SOB, N/V, diarrhea/constipation, back pain, flank pain, hematuria, and dysuria.     HX:    Renal mass   [name] is a 48 year old female with a solid and enhancing renal mass measuring 2.7 cm involving the LEFT kidney. The mass was initially discovered on MRCP from 12/06/2022 during evaluation of right upper quadrant pain.   -Anatomy: 2.7 cm left posterior lower pole renal mass. Early branching of the left renal vein and single artery. Normal right kidney  -Personal/family history of GU malignancies: Denies  -Smoking history: Previously smoked 1/2 ppd for 20 years. Quit 1 year ago  -Prior abdominal surgeries: none  -Renal function: BMP from 12/07/2022 revealed a serum creatinine of 0.96 with an EGFR greater than 60  -History of kidney stones: denies     ALLERGIES: None   MEDICATIONS: Atorvastatin  Calcium   metFORMIN  HCl ER  Metoprolol  Succinate ER  Spironolactone  1 PO Daily  Valsartan      GU PSH: None     PSH Notes: Bunion surgery, 2023  right ankle surgery with plate  right knee "bone removal"  left knee "bone removal"   NON-GU PSH: None   GU PMH: Left renal neoplasm - 06/01/2023, - 12/14/2022, - 12/12/2022      PMH Notes: osteochondroma    NON-GU PMH: Diabetes Type  2 Hypercholesterolemia Hypertension    FAMILY HISTORY: 2 sons - Runs in Family Diabetes - Mother Hypertension - Mother, Father Seizure - Mother   SOCIAL HISTORY: Marital Status: Single Preferred Language: English; Ethnicity: Not Hispanic Or Latino; Race: Black or African American Current Smoking Status: Patient does not smoke anymore.   Tobacco Use Assessment Completed: Used Tobacco in last 30 days? Uses smokeless tobacco. Has never drank.  Does not use drugs. Does not drink caffeine. Has not had a blood transfusion. Patient's occupation Firefighter.     Notes: Vape daily   REVIEW OF SYSTEMS:    GU Review Female:   Patient denies frequent urination, hard to postpone urination, burning /pain with urination, get up at night to urinate, leakage of urine, stream starts and stops, trouble starting your stream, have to strain to urinate, and being pregnant.  Gastrointestinal (Upper):   Patient denies nausea, vomiting, and indigestion/ heartburn.  Gastrointestinal (Lower):   Patient denies constipation and diarrhea.  Constitutional:   Patient denies fever, night sweats, weight loss, and fatigue.  Skin:   Patient denies skin rash/ lesion and itching.  Eyes:   Patient denies blurred vision and double vision.  Ears/ Nose/ Throat:   Patient denies sore throat and sinus problems.  Hematologic/Lymphatic:   Patient denies swollen glands and easy bruising.  Cardiovascular:   Patient denies leg swelling and chest pains.  Respiratory:   Patient denies cough and shortness of breath.  Endocrine:   Patient denies excessive thirst.  Musculoskeletal:   Patient denies back pain and  joint pain.  Neurological:   Patient denies headaches and dizziness.  Psychologic:   Patient denies depression and anxiety.   VITAL SIGNS:      06/13/2023 03:09 PM  Weight 212 lb / 96.16 kg  Height 64 in / 162.56 cm  BP 129/78 mmHg  Pulse 85 /min  BMI 36.4 kg/m   MULTI-SYSTEM PHYSICAL EXAMINATION:     Constitutional: Well-nourished. No physical deformities. Normally developed. Good grooming.  Neck: Neck symmetrical, not swollen. Normal tracheal position.  Respiratory: Normal breath sounds. No labored breathing, no use of accessory muscles.   Cardiovascular: Regular rate and rhythm. No murmur, no gallop.   Lymphatic: No enlargement of neck, axillae, groin.  Skin: No paleness, no jaundice, no cyanosis. No lesion, no ulcer, no rash.  Neurologic / Psychiatric: Oriented to time, oriented to place, oriented to person. No depression, no anxiety, no agitation.  Gastrointestinal: No mass, no tenderness, no rigidity, obese abdomen.   Eyes: Normal conjunctivae. Normal eyelids.  Ears, Nose, Mouth, and Throat: Left ear no scars, no lesions, no masses. Right ear no scars, no lesions, no masses. Nose no scars, no lesions, no masses. Normal hearing. Normal lips.  Musculoskeletal: Normal gait and station of head and neck.     Complexity of Data:  Records Review:   Previous Patient Records  Urine Test Review:   Urinalysis   06/13/23  Urinalysis  Urine Appearance Cloudy   Urine Color Yellow   Urine Glucose Neg mg/dL  Urine Bilirubin Neg mg/dL  Urine Ketones Neg mg/dL  Urine Specific Gravity 1.025   Urine Blood Neg ery/uL  Urine pH 6.0   Urine Protein 1+ mg/dL  Urine Urobilinogen 0.2 mg/dL  Urine Nitrites Neg   Urine Leukocyte Esterase Neg leu/uL  Urine WBC/hpf NS (Not Seen)   Urine RBC/hpf 0 - 2/hpf   Urine Epithelial Cells 6 - 10/hpf   Urine Bacteria Mod (26-50/hpf)   Urine Mucous Not Present   Urine Yeast NS (Not Seen)   Urine Trichomonas Not Present   Urine Cystals NS (Not Seen)   Urine Casts NS (Not Seen)   Urine Sperm Not Present    PROCEDURES:          Urinalysis w/Scope - 81001 Dipstick Dipstick Cont'd Micro  Color: Yellow Bilirubin: Neg mg/dL WBC/hpf: NS (Not Seen)  Appearance: Cloudy Ketones: Neg mg/dL RBC/hpf: 0 - 2/hpf  Specific Gravity: 1.025 Blood: Neg ery/uL Bacteria:  Mod (26-50/hpf)  pH: 6.0 Protein: 1+ mg/dL Cystals: NS (Not Seen)  Glucose: Neg mg/dL Urobilinogen: 0.2 mg/dL Casts: NS (Not Seen)    Nitrites: Neg Trichomonas: Not Present    Leukocyte Esterase: Neg leu/uL Mucous: Not Present      Epithelial Cells: 6 - 10/hpf      Yeast: NS (Not Seen)      Sperm: Not Present    ASSESSMENT:      ICD-10 Details  1 GU:   Left renal neoplasm - D49.512    PLAN:           Orders Labs Urine Culture          Schedule Return Visit/Planned Activity: Keep Scheduled Appointment - Schedule Surgery          Document Letter(s):  Created for Patient: Clinical Summary         Notes:   There are no changes in the patients history or physical exam since last evaluation by Dr. Sherrine Dolly. Pt is scheduled to undergo RAL left partial nephrectomy on 07/04/23.  Urine for culture   All pt's questions were answered to the best of my ability.    -I personally reviewed imaging results and films with the patient. We discussed that the mass in question has features concerning for malignancy. I explained the natural history of presumed renal cell carcinoma. I reviewed the AUA guidelines for evaluation and treatment of the small renal mass. The options of active surveillance, in situ tumor ablation, partial and radical nephrectomy was discussed. The risks of robot-assisted LEFT partial nephrectomy were discussed in detail including but not limited to: negative pathology, open conversion, completion nephrectomy, infection of the urinary tract/skin/abdominal cavity, VTE, MI/CVA, lymphatic leak, injury to adjacent solid/hollow viscus organs, bleeding requiring a blood transfusion, catastrophic bleeding, hernia formation, need for postoperative angioembolization, urinary leak requiring stent/drain, and other imponderables.

## 2023-07-04 NOTE — Anesthesia Procedure Notes (Signed)
 Procedure Name: Intubation Date/Time: 07/04/2023 7:33 AM  Performed by: Andee Bamberger, CRNAPre-anesthesia Checklist: Patient identified, Emergency Drugs available, Suction available and Patient being monitored Patient Re-evaluated:Patient Re-evaluated prior to induction Oxygen Delivery Method: Circle system utilized Preoxygenation: Pre-oxygenation with 100% oxygen Induction Type: IV induction Ventilation: Mask ventilation without difficulty Laryngoscope Size: Mac and 4 Grade View: Grade I Tube type: Oral Tube size: 7.0 mm Number of attempts: 1 Airway Equipment and Method: Stylet and Oral airway Placement Confirmation: ETT inserted through vocal cords under direct vision, positive ETCO2 and breath sounds checked- equal and bilateral Secured at: 22 cm Tube secured with: Tape Dental Injury: Teeth and Oropharynx as per pre-operative assessment

## 2023-07-05 ENCOUNTER — Other Ambulatory Visit: Payer: Self-pay

## 2023-07-05 ENCOUNTER — Encounter (HOSPITAL_COMMUNITY): Payer: Self-pay | Admitting: Urology

## 2023-07-05 LAB — GLUCOSE, CAPILLARY
Glucose-Capillary: 135 mg/dL — ABNORMAL HIGH (ref 70–99)
Glucose-Capillary: 200 mg/dL — ABNORMAL HIGH (ref 70–99)
Glucose-Capillary: 219 mg/dL — ABNORMAL HIGH (ref 70–99)
Glucose-Capillary: 155 mg/dL — ABNORMAL HIGH (ref 70–99)

## 2023-07-05 LAB — HEMOGLOBIN AND HEMATOCRIT, BLOOD
HCT: 29.8 % — ABNORMAL LOW (ref 36.0–46.0)
HCT: 29.3 % — ABNORMAL LOW (ref 36.0–46.0)
Hemoglobin: 8.7 g/dL — ABNORMAL LOW (ref 12.0–15.0)
Hemoglobin: 8.8 g/dL — ABNORMAL LOW (ref 12.0–15.0)

## 2023-07-05 LAB — BASIC METABOLIC PANEL WITH GFR
Anion gap: 10 (ref 5–15)
BUN: 10 mg/dL (ref 6–20)
CO2: 20 mmol/L — ABNORMAL LOW (ref 22–32)
Calcium: 8.3 mg/dL — ABNORMAL LOW (ref 8.9–10.3)
Chloride: 103 mmol/L (ref 98–111)
Creatinine, Ser: 1.2 mg/dL — ABNORMAL HIGH (ref 0.44–1.00)
GFR, Estimated: 56 mL/min — ABNORMAL LOW (ref >60)
Glucose, Bld: 146 mg/dL — ABNORMAL HIGH (ref 70–99)
Potassium: 3.6 mmol/L (ref 3.5–5.1)
Sodium: 133 mmol/L — ABNORMAL LOW (ref 135–145)

## 2023-07-05 LAB — SURGICAL PATHOLOGY

## 2023-07-05 LAB — CREATININE, FLUID (PLEURAL, PERITONEAL, JP DRAINAGE): Creat, Fluid: 1.1 mg/dL

## 2023-07-05 MED ORDER — ORAL CARE MOUTH RINSE: 15.0000 mL | OROMUCOSAL | Status: DC | PRN

## 2023-07-05 MED ORDER — ACETAMINOPHEN 500 MG PO TABS
1000.0000 mg | ORAL_TABLET | Freq: Four times a day (QID) | ORAL | Status: DC | PRN
  Administered 2023-07-05 – 2023-07-06 (×3): 1000 mg via ORAL
  Filled 2023-07-05 (×3): qty 2

## 2023-07-05 NOTE — Progress Notes (Signed)
 1 Day Post-Op Subjective: Patient reports feeling well.  Passing flatus.  No N/V. Good pain control.   Objective: Vital signs in last 24 hours: Temp:  [98 F (36.7 C)-99.7 F (37.6 C)] 99 F (37.2 C) (05/28 0900) Pulse Rate:  [71-93] 84 (05/28 0900) Resp:  [15-26] 18 (05/28 0900) BP: (123-176)/(66-90) 129/73 (05/28 0900) SpO2:  [94 %-100 %] 100 % (05/28 0900) Weight:  [98.6 kg] 98.6 kg (05/27 1306)  Intake/Output from previous day: 05/27 0701 - 05/28 0700 In: 3836.9 [P.O.:550; I.V.:3236.9; IV Piggyback:50] Out: 65784 [Urine:55400; Drains:85; Blood:280] Intake/Output this shift: Total I/O In: 588.2 [P.O.:120; I.V.:468.2] Out: 970 [Urine:900; Drains:70]  Physical Exam:  General:alert, cooperative, and no distress Cardiovascular: RRR Lungs: CTA GI: soft, NT, ND, +BS Incisions: C/D/I Urine: clear Extremities: SCDs in place  Lab Results: Recent Labs    07/04/23 1113 07/05/23 0519  HGB 9.9* 8.7*  HCT 34.1* 29.8*   BMET Recent Labs    07/05/23 0519  NA 133*  K 3.6  CL 103  CO2 20*  GLUCOSE 146*  BUN 10  CREATININE 1.20*  CALCIUM  8.3*   No results for input(s): "LABPT", "INR" in the last 72 hours. No results for input(s): "LABURIN" in the last 72 hours. Results for orders placed or performed in visit on 04/20/20  Chlamydia/Gonococcus/Trichomonas, NAA     Status: None   Collection Time: 04/20/20 10:27 AM   Specimen: Urine   UR  Result Value Ref Range Status   Chlamydia by NAA Negative Negative Final   Gonococcus by NAA Negative Negative Final   Trich vag by NAA Negative Negative Final    Studies/Results: No results found.  Assessment/Plan: 1 Day Post-Op, Procedure(s) (LRB): XI ROBOTIC ASSITED LEFT  PARTIAL NEPHRECTOMY (Left)  Doing well  Ambulate, Incentive spirometry DVT prophylaxis Transition to PO pain medications Check drain creatinine level SL IVF Advance diet Repeat H/H at 11 to ensure stable. Expected EBL during surgery. Drop at least  partially hemodilutional. If JP Cr consistent with serum will d/c JP Possibly home later today vs am   LOS: 0 days   Carrolyn Clan 07/05/2023, 9:33 AM

## 2023-07-05 NOTE — Progress Notes (Signed)
   07/05/23 1452  Assess: MEWS Score  Temp (!) 100.7 F (38.2 C)  BP 134/78  MAP (mmHg) 95  Pulse Rate (!) 103  Resp 18  SpO2 100 %  O2 Device Room Air  Assess: MEWS Score  MEWS Temp 1  MEWS Systolic 0  MEWS Pulse 1  MEWS RR 0  MEWS LOC 0  MEWS Score 2  MEWS Score Color Yellow  Assess: if the MEWS score is Yellow or Red  Were vital signs accurate and taken at a resting state? Yes  Does the patient meet 2 or more of the SIRS criteria? No  MEWS guidelines implemented  Yes, yellow  Treat  MEWS Interventions Considered administering scheduled or prn medications/treatments as ordered  Take Vital Signs  Increase Vital Sign Frequency  Yellow: Q2hr x1, continue Q4hrs until patient remains green for 12hrs  Escalate  MEWS: Escalate Yellow: Discuss with charge nurse and consider notifying provider and/or RRT  Notify: Charge Nurse/RN  Name of Charge Nurse/RN Notified Otilio Block, RN  Assess: SIRS CRITERIA  SIRS Temperature  0  SIRS Respirations  0  SIRS Pulse 1  SIRS WBC 0  SIRS Score Sum  1

## 2023-07-05 NOTE — Progress Notes (Signed)
   07/05/23 0900  TOC Brief Assessment  Insurance and Status Reviewed  Patient has primary care physician Yes  Home environment has been reviewed Resides in single family home with child(ren)  Prior level of function: Independent with ADLs at baseline  Prior/Current Home Services No current home services  Social Drivers of Health Review SDOH reviewed no interventions necessary  Readmission risk has been reviewed Yes  Transition of care needs no transition of care needs at this time

## 2023-07-06 ENCOUNTER — Observation Stay (HOSPITAL_COMMUNITY)

## 2023-07-06 ENCOUNTER — Other Ambulatory Visit (HOSPITAL_COMMUNITY): Payer: Self-pay

## 2023-07-06 DIAGNOSIS — E785 Hyperlipidemia, unspecified: Secondary | ICD-10-CM | POA: Diagnosis not present

## 2023-07-06 DIAGNOSIS — E7800 Pure hypercholesterolemia, unspecified: Secondary | ICD-10-CM | POA: Diagnosis not present

## 2023-07-06 DIAGNOSIS — E669 Obesity, unspecified: Secondary | ICD-10-CM | POA: Diagnosis not present

## 2023-07-06 DIAGNOSIS — Z820 Family history of epilepsy and other diseases of the nervous system: Secondary | ICD-10-CM | POA: Diagnosis not present

## 2023-07-06 DIAGNOSIS — E119 Type 2 diabetes mellitus without complications: Secondary | ICD-10-CM | POA: Diagnosis not present

## 2023-07-06 DIAGNOSIS — D509 Iron deficiency anemia, unspecified: Secondary | ICD-10-CM | POA: Diagnosis not present

## 2023-07-06 DIAGNOSIS — R509 Fever, unspecified: Secondary | ICD-10-CM | POA: Diagnosis not present

## 2023-07-06 DIAGNOSIS — I10 Essential (primary) hypertension: Secondary | ICD-10-CM | POA: Diagnosis not present

## 2023-07-06 DIAGNOSIS — Z8249 Family history of ischemic heart disease and other diseases of the circulatory system: Secondary | ICD-10-CM | POA: Diagnosis not present

## 2023-07-06 DIAGNOSIS — Z6836 Body mass index (BMI) 36.0-36.9, adult: Secondary | ICD-10-CM | POA: Diagnosis not present

## 2023-07-06 DIAGNOSIS — N2889 Other specified disorders of kidney and ureter: Secondary | ICD-10-CM | POA: Diagnosis present

## 2023-07-06 DIAGNOSIS — C642 Malignant neoplasm of left kidney, except renal pelvis: Secondary | ICD-10-CM | POA: Diagnosis not present

## 2023-07-06 DIAGNOSIS — Z79899 Other long term (current) drug therapy: Secondary | ICD-10-CM | POA: Diagnosis not present

## 2023-07-06 DIAGNOSIS — J9811 Atelectasis: Secondary | ICD-10-CM | POA: Diagnosis not present

## 2023-07-06 DIAGNOSIS — Z87891 Personal history of nicotine dependence: Secondary | ICD-10-CM | POA: Diagnosis not present

## 2023-07-06 DIAGNOSIS — Z7984 Long term (current) use of oral hypoglycemic drugs: Secondary | ICD-10-CM | POA: Diagnosis not present

## 2023-07-06 DIAGNOSIS — Z833 Family history of diabetes mellitus: Secondary | ICD-10-CM | POA: Diagnosis not present

## 2023-07-06 LAB — GLUCOSE, CAPILLARY
Glucose-Capillary: 158 mg/dL — ABNORMAL HIGH (ref 70–99)
Glucose-Capillary: 191 mg/dL — ABNORMAL HIGH (ref 70–99)

## 2023-07-06 NOTE — Progress Notes (Signed)
 2 Days Post-Op Subjective: Patient reports feeling well.  Passing flatus. No BM, N/V. Ambulated several times last night and is using IS.  Persistent LGF last night into early am.  Received tylenol  this am and subsequent temp was 99.    Objective: Vital signs in last 24 hours: Temp:  [99 F (37.2 C)-100.8 F (38.2 C)] 99.6 F (37.6 C) (05/29 0723) Pulse Rate:  [84-110] 103 (05/29 0723) Resp:  [17-20] 17 (05/29 0723) BP: (105-139)/(62-84) 105/62 (05/29 0723) SpO2:  [99 %-100 %] 99 % (05/29 0723)  Intake/Output from previous day: 05/28 0701 - 05/29 0700 In: 904.9 [P.O.:360; I.V.:544.9] Out: 1190 [Urine:1100; Drains:90] Intake/Output this shift: No intake/output data recorded.  Physical Exam:  General:alert, cooperative, and no distress Cardiovascular: tachy Lungs: CTA GI: soft NT ND +BS Incisions: C/D/I Ext: no edema  Lab Results: Recent Labs    07/04/23 1113 07/05/23 0519 07/05/23 0933  HGB 9.9* 8.7* 8.8*  HCT 34.1* 29.8* 29.3*   BMET Recent Labs    07/05/23 0519  NA 133*  K 3.6  CL 103  CO2 20*  GLUCOSE 146*  BUN 10  CREATININE 1.20*  CALCIUM  8.3*   No results for input(s): "LABPT", "INR" in the last 72 hours. No results for input(s): "LABURIN" in the last 72 hours. Results for orders placed or performed in visit on 04/20/20  Chlamydia/Gonococcus/Trichomonas, NAA     Status: None   Collection Time: 04/20/20 10:27 AM   Specimen: Urine   UR  Result Value Ref Range Status   Chlamydia by NAA Negative Negative Final   Gonococcus by NAA Negative Negative Final   Trich vag by NAA Negative Negative Final    Studies/Results: No results found.  Assessment/Plan: 2 Days Post-Op, Procedure(s) (LRB): XI ROBOTIC ASSITED LEFT  PARTIAL NEPHRECTOMY (Left)  Ambulate, Incentive spirometry DVT prophylaxis Hg stable LGF likely ATX.  Will check CXR to eval.  Cont ambulation, aggressive pulm toilet, and tylenol . Re-eval for possible d/c later today   LOS: 0 days    Kaylee Maxwell 07/06/2023, 7:37 AM

## 2023-07-06 NOTE — Discharge Summary (Signed)
 Date of admission: 07/04/2023  Date of discharge: 07/06/2023  Admission diagnosis: left renal mass  Discharge diagnosis: same  Secondary diagnoses: DM II, HLD, HTN, osteochondroma  History and Physical: For full details, please see admission history and physical. Briefly, Kaylee Maxwell is a 48 y.o. year old patient with a solid and enhancing renal mass measuring 2.7 cm involving the LEFT kidney. The mass was initially discovered on MRCP from 12/06/2022 during evaluation of right upper quadrant pain.   Hospital Course: Pt was admitted and taken to the OR on 07/04/23 for a robotic assisted lap left partial nephrectomy.  Pt tolerated the procedure well and was hemodynamically stable throughout.  She was extubated without complication and woke up from anesthesia neurologically intact. Pt was transferred from the OR to the PACU and then to the floor without issue.  On POD 1 her foley was removed and she was able to void.  She was passing flatus and her diet was advanced to regular. Her Hg dropped to 8.7.  She has iron deficiency anemia and was continued on her supplement iron post op.  She remained hemodynamically stable. She did have an episode of dizziness with nausea when she first attempted to ambulate. This resolved with subsequent attempts.  On the evening of POD 1 she developed a low grade fever.  She was instructed to ambulate and use the incentive spirometer q 1 hour as it was likely due to atelectasis.  She continued to have a LFG overnight but this improved with Tylenol  and on the morning of POD 2 her temp was 99.  A chest xray was obtained on POD 2 to r/o other etiology and confirmed bibasilar atelectasis.  She was able to ambulate, void, and tolerate a regular diet all with good pain control. She was felt stable for d/c home.  At time of d/c her temp was 99.   Laboratory values:  Recent Labs    07/04/23 1113 07/05/23 0519 07/05/23 0933  HGB 9.9* 8.7* 8.8*  HCT 34.1* 29.8* 29.3*    Recent Labs    07/05/23 0519  CREATININE 1.20*    Disposition: Home  Discharge instruction: The patient was instructed to be ambulatory but told to refrain from heavy lifting, strenuous activity, or driving. She was instructed to call our office if her temp got to 101 or higher.   Discharge medications:  Allergies as of 07/06/2023       Reactions   Ozempic  (0.25 Or 0.5 Mg-dose) [semaglutide (0.25 Or 0.5mg -dos)]    Abdominal pain        Medication List     STOP taking these medications    ibuprofen  200 MG tablet Commonly known as: ADVIL    Lysine 500 MG Tabs   VITAMIN C PO       TAKE these medications    atorvastatin  10 MG tablet Commonly known as: LIPITOR Take 1 tablet (10 mg total) by mouth daily.   BERBERINE HCI PO Take 2,000 mg by mouth daily.   docusate sodium  100 MG capsule Commonly known as: COLACE Take 1 capsule (100 mg total) by mouth 2 (two) times daily.   empagliflozin  10 MG Tabs tablet Commonly known as: Jardiance  Take 1 tablet (10 mg total) by mouth daily before breakfast.   ferrous sulfate  325 (65 FE) MG EC tablet Take 1 tablet (325 mg total) by mouth daily.   freestyle lancets Use as directed   FREESTYLE LITE test strip Generic drug: glucose blood Use as instructed   FreeStyle Lite  w/Device Kit Use as directed   HYDROcodone -acetaminophen  5-325 MG tablet Commonly known as: NORCO/VICODIN Take 1-2 tablets by mouth every 6 (six) hours as needed for moderate pain (pain score 4-6) or severe pain (pain score 7-10).   metFORMIN  500 MG 24 hr tablet Commonly known as: GLUCOPHAGE -XR Take 500 mg with breakfast daily by mouth  for one week , then increase to 500 mg by mouth twice daily with meal   metoprolol  succinate 50 MG 24 hr tablet Commonly known as: TOPROL -XL Take 1 tablet (50 mg total) by mouth daily. Take with or immediately following a meal. Please schedule follow up appt for more refills   pantoprazole  40 MG tablet Commonly  known as: PROTONIX  Take 1 tablet (40 mg total) by mouth 2 (two) times daily before a meal. Twice daily for 1 month then may decrease to once daily as per prior prescription.   spironolactone  25 MG tablet Commonly known as: ALDACTONE  Take 1 tablet (25 mg total) by mouth daily.   traZODone  50 MG tablet Commonly known as: DESYREL  Take 1 tablet (50 mg total) by mouth at bedtime as needed for sleep   valsartan  80 MG tablet Commonly known as: Diovan  Take 1 tablet (80 mg total) by mouth daily.        Followup:   Follow-up Information     Adelbert Homans, MD Follow up on 07/17/2023.   Specialty: Urology Why: at 3:00 Contact information: [address] [place] [address] [place] [address] [phone]

## 2023-07-11 ENCOUNTER — Other Ambulatory Visit: Payer: Self-pay

## 2023-07-11 ENCOUNTER — Other Ambulatory Visit (HOSPITAL_COMMUNITY): Payer: Self-pay

## 2023-07-11 MED ORDER — TRAMADOL HCL 50 MG PO TABS
50.0000 mg | ORAL_TABLET | ORAL | 0 refills | Status: DC | PRN
  Filled 2023-07-11 (×2): qty 20, 4d supply, fill #0

## 2023-07-17 DIAGNOSIS — C642 Malignant neoplasm of left kidney, except renal pelvis: Secondary | ICD-10-CM | POA: Diagnosis not present

## 2023-07-17 DIAGNOSIS — D49512 Neoplasm of unspecified behavior of left kidney: Secondary | ICD-10-CM | POA: Diagnosis not present

## 2023-07-17 DIAGNOSIS — R1013 Epigastric pain: Secondary | ICD-10-CM | POA: Diagnosis not present

## 2023-07-18 ENCOUNTER — Other Ambulatory Visit (HOSPITAL_COMMUNITY): Payer: Self-pay

## 2023-07-18 MED ORDER — ONDANSETRON 4 MG PO TBDP
4.0000 mg | ORAL_TABLET | ORAL | 3 refills | Status: DC | PRN
  Filled 2023-07-18: qty 30, 5d supply, fill #0

## 2023-07-21 ENCOUNTER — Other Ambulatory Visit (HOSPITAL_COMMUNITY): Payer: Self-pay

## 2023-07-21 ENCOUNTER — Encounter: Payer: Self-pay | Admitting: Nurse Practitioner

## 2023-07-21 ENCOUNTER — Other Ambulatory Visit: Payer: Self-pay

## 2023-07-21 ENCOUNTER — Ambulatory Visit (INDEPENDENT_AMBULATORY_CARE_PROVIDER_SITE_OTHER): Payer: Self-pay | Admitting: Nurse Practitioner

## 2023-07-21 VITALS — BP 103/55 | HR 101 | Temp 97.1°F | Wt 201.0 lb

## 2023-07-21 DIAGNOSIS — F5101 Primary insomnia: Secondary | ICD-10-CM | POA: Diagnosis not present

## 2023-07-21 DIAGNOSIS — E878 Other disorders of electrolyte and fluid balance, not elsewhere classified: Secondary | ICD-10-CM | POA: Insufficient documentation

## 2023-07-21 DIAGNOSIS — E785 Hyperlipidemia, unspecified: Secondary | ICD-10-CM | POA: Diagnosis not present

## 2023-07-21 DIAGNOSIS — R1084 Generalized abdominal pain: Secondary | ICD-10-CM | POA: Diagnosis not present

## 2023-07-21 DIAGNOSIS — I10 Essential (primary) hypertension: Secondary | ICD-10-CM | POA: Diagnosis not present

## 2023-07-21 DIAGNOSIS — N2889 Other specified disorders of kidney and ureter: Secondary | ICD-10-CM

## 2023-07-21 DIAGNOSIS — L309 Dermatitis, unspecified: Secondary | ICD-10-CM | POA: Insufficient documentation

## 2023-07-21 DIAGNOSIS — D509 Iron deficiency anemia, unspecified: Secondary | ICD-10-CM

## 2023-07-21 DIAGNOSIS — E119 Type 2 diabetes mellitus without complications: Secondary | ICD-10-CM

## 2023-07-21 MED ORDER — TRIAMCINOLONE ACETONIDE 0.1 % EX CREA
1.0000 | TOPICAL_CREAM | Freq: Two times a day (BID) | CUTANEOUS | 0 refills | Status: DC
  Filled 2023-07-21: qty 30, 15d supply, fill #0

## 2023-07-21 NOTE — Assessment & Plan Note (Addendum)
 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

## 2023-07-21 NOTE — Assessment & Plan Note (Signed)
 BP Readings from Last 3 Encounters:  07/21/23 (!) 103/55  07/06/23 112/70  06/23/23 139/83    Controlled on valsartan  80 mg daily, spironolactone  25 mg daily, metoprolol  50 mg daily Continue current medications. No changes in management. Discussed DASH diet and dietary sodium restrictions Continue to increase dietary efforts and exercise.

## 2023-07-21 NOTE — Patient Instructions (Addendum)
 Goal for fasting blood sugar ranges from 80 to 120 and 2 hours after any meal or at bedtime should be between 130 to 170.   Aaron Aas Dermatitis  - triamcinolone cream (KENALOG) 0.1 %; Apply 1 Application topically 2 (two) times daily for 1-2 weeks Please keep your skin clean and dry, avoid hot shower/hot bath  I recommend using CeraVe or Cetaphil moisturizing lotion  Avoid scratching to prevent infection and skin breakdown      Mansouraty, Albino Alu., MD Follow up.   Specialties: Gastroenterology, Internal Medicine Contact information: [address] [place] [address] [phone]     It is important that you exercise regularly at least 30 minutes 5 times a week as tolerated  Think about what you will eat, plan ahead. Choose  clean, green, fresh or frozen over canned, processed or packaged foods which are more sugary, salty and fatty. 70 to 75% of food eaten should be vegetables and fruit. Three meals at set times with snacks allowed between meals, but they must be fruit or vegetables. Aim to eat over a 12 hour period , example 7 am to 7 pm, and STOP after  your last meal of the day. Drink water ,generally about 64 ounces per day, no other drink is as healthy. Fruit juice is best enjoyed in a healthy way, by EATING the fruit.  Thanks for choosing [facility] we consider it a privelige to serve you.

## 2023-07-21 NOTE — Assessment & Plan Note (Addendum)
 Checking lipid panel On atorvastatin  10 mg daily Lab Results  Component Value Date   CHOL 203 (H) 06/09/2023   HDL 53 06/09/2023   LDLCALC 126 (H) 06/09/2023   LDLDIRECT 71 10/28/2022   TRIG 134 06/09/2023   CHOLHDL 3.8 06/09/2023

## 2023-07-21 NOTE — Progress Notes (Signed)
 Established Patient Office Visit  Subjective:  Patient ID: Kaylee Maxwell, female    DOB: 12/18/1975  Age: 48 y.o. MRN: [id number]  CC:  Chief Complaint  Patient presents with   Abdominal Pain    HPI Kaylee Maxwell is a 48 y.o. female  has a past medical history of Anxiety (03/04/2018), Breast cancer screening (03/04/2018), Heart palpitations (03/04/2018), Hypertension, Iron deficiency, Irregular heartbeat (03/04/2018), Mild mitral regurgitation, Obesity, Osteochondroma, PVC's (premature ventricular contractions), Scalp mass (10/09/2012), Sinus tachycardia, and Type 2 diabetes mellitus (HCC).  Patient presents for follow-up for her chronic medical conditions  Type 2 diabetes.  Currently on metformin  500 mg daily, takes atorvastatin  10 mg daily for hyperlipidemia.  She denies polyuria polyphagia polydipsia  Abdominal pain.  Patient complains of generalized abdominal pain that started after her surgery, had this type of pain last year when she was hospitalized, was recommended to follow-up with GI at that time.  Stated that her stomach pain had resolved since she had nephrectomy .  Takes Zofran  as needed for nausea, stated that urology plans on doing an imaging study.       Past Medical History:  Diagnosis Date   Anxiety 03/04/2018   Breast cancer screening 03/04/2018   Heart palpitations 03/04/2018   Monitor 9/21: normal sinus rhythm, sinus tachycardia, Avg HR 110; occ PVCs   Hypertension    Iron deficiency    Irregular heartbeat 03/04/2018   Mild mitral regurgitation    Obesity    Osteochondroma    PVC's (premature ventricular contractions)    Scalp mass 10/09/2012   Sinus tachycardia    Type 2 diabetes mellitus [facility])     Past Surgical History:  Procedure Laterality Date   ANKLE FRACTURE SURGERY Right    BIOPSY  02/02/2023   Procedure: BIOPSY;  Surgeon: Normie Becton., MD;  Location: [facility];  Service: Gastroenterology;;   Tillman Folks Right    cyst  removal from scalp     ESOPHAGOGASTRODUODENOSCOPY N/A 02/02/2023   Procedure: ESOPHAGOGASTRODUODENOSCOPY (EGD);  Surgeon: Normie Becton., MD;  Location: [facility];  Service: Gastroenterology;  Laterality: N/A;   EUS N/A 02/02/2023   Procedure: UPPER ENDOSCOPIC ULTRASOUND (EUS) RADIAL;  Surgeon: Normie Becton., MD;  Location: [facility];  Service: Gastroenterology;  Laterality: N/A;   left leg bone removal     ROBOTIC ASSITED PARTIAL NEPHRECTOMY Left 07/04/2023   Procedure: XI ROBOTIC ASSITED LEFT  PARTIAL NEPHRECTOMY;  Surgeon: Adelbert Homans, MD;  Location: [facility] ORS;  Service: Urology;  Laterality: Left;  180 MINUTES    Family History  Problem Relation Age of Onset   Diabetes Mother    Hypertension Mother    Obesity Mother    Hypertension Father    Cancer Paternal Aunt        pancreatic   Cancer Maternal Grandmother        lung   Autism spectrum disorder Sister     Social History   Socioeconomic History   Marital status: Single    Spouse name: Not on file   Number of children: 2   Years of education: Not on file   Highest education level: Associate degree: academic program  Occupational History   Not on file  Tobacco Use   Smoking status: Former    Current packs/day: 0.50    Average packs/day: 0.5 packs/day for 18.0 years (9.0 ttl pk-yrs)    Types: Cigarettes    Passive exposure: Never   Smokeless tobacco: Never  Vaping Use  Vaping status: Every Day   Substances: Nicotine , Flavoring  Substance and Sexual Activity   Alcohol use: Yes    Comment: occasional   Drug use: No   Sexual activity: Yes    Birth control/protection: None  Other Topics Concern   Not on file  Social History Narrative   Lives with her sons.    Social Drivers of Health   Financial Resource Strain: High Risk (03/21/2018)   Overall Financial Resource Strain (CARDIA)    Difficulty of Paying Living Expenses: Hard  Food Insecurity: No Food Insecurity (07/04/2023)    Hunger Vital Sign    Worried About Running Out of Food in the Last Year: Never true    Ran Out of Food in the Last Year: Never true  Transportation Needs: No Transportation Needs (07/04/2023)   PRAPARE - Administrator, Civil Service (Medical): No    Lack of Transportation (Non-Medical): No  Physical Activity: Inactive (03/21/2018)   Exercise Vital Sign    Days of Exercise per Week: 0 days    Minutes of Exercise per Session: 0 min  Stress: Stress Concern Present (03/21/2018)   [employer] of Occupational Health - Occupational Stress Questionnaire    Feeling of Stress : Rather much  Social Connections: Moderately Integrated (03/21/2018)   Social Connection and Isolation Panel    Frequency of Communication with Friends and Family: Three times a week    Frequency of Social Gatherings with Friends and Family: Twice a week    Attends Religious Services: More than 4 times per year    Active Member of [employer] or Organizations: Yes    Attends Banker Meetings: Never    Marital Status: Never married  Intimate Partner Violence: Not At Risk (07/04/2023)   Humiliation, Afraid, Rape, and Kick questionnaire    Fear of Current or Ex-Partner: No    Emotionally Abused: No    Physically Abused: No    Sexually Abused: No    Outpatient Medications Prior to Visit  Medication Sig Dispense Refill   atorvastatin  (LIPITOR) 10 MG tablet Take 1 tablet (10 mg total) by mouth daily. 90 tablet 1   Berberine Chloride (BERBERINE HCI PO) Take 2,000 mg by mouth daily.     ferrous sulfate  325 (65 FE) MG EC tablet Take 1 tablet (325 mg total) by mouth daily. 90 tablet 1   metFORMIN  (GLUCOPHAGE -XR) 500 MG 24 hr tablet Take 500 mg with breakfast daily by mouth  for one week , then increase to 500 mg by mouth twice daily with meal 180 tablet 1   metoprolol  succinate (TOPROL -XL) 50 MG 24 hr tablet Take 1 tablet (50 mg total) by mouth daily. Take with or immediately following a meal. Please  schedule follow up appt for more refills 30 tablet 0   ondansetron  (ZOFRAN -ODT) 4 MG disintegrating tablet Take 1 tablet (4 mg total) by mouth every 4 (four) hours as needed for nausea. 30 tablet 3   spironolactone  (ALDACTONE ) 25 MG tablet Take 1 tablet (25 mg total) by mouth daily. 30 tablet 0   traMADol  (ULTRAM ) 50 MG tablet Take 1 tablet (50 mg total) by mouth every 4 (four) hours as needed. 20 tablet 0   traZODone  (DESYREL ) 50 MG tablet Take 1 tablet (50 mg total) by mouth at bedtime as needed for sleep 90 tablet 1   valsartan  (DIOVAN ) 80 MG tablet Take 1 tablet (80 mg total) by mouth daily. 30 tablet 0   Blood Glucose Monitoring Suppl (FREESTYLE  LITE) w/Device KIT Use as directed (Patient not taking: Reported on 07/21/2023) 1 kit 0   docusate sodium  (COLACE) 100 MG capsule Take 1 capsule (100 mg total) by mouth 2 (two) times daily. (Patient not taking: Reported on 07/21/2023)     glucose blood (GNP TRUE METRIX GLUCOSE STRIPS) test strip Use as instructed (Patient not taking: Reported on 07/21/2023) 100 each 12   HYDROcodone -acetaminophen  (NORCO/VICODIN) 5-325 MG tablet Take 1-2 tablets by mouth every 6 (six) hours as needed for moderate pain (pain score 4-6) or severe pain (pain score 7-10). (Patient not taking: Reported on 07/21/2023) 20 tablet 0   Lancets (FREESTYLE) lancets Use as directed (Patient not taking: Reported on 07/21/2023) 100 each 12   pantoprazole  (PROTONIX ) 40 MG tablet Take 1 tablet (40 mg total) by mouth 2 (two) times daily before a meal. Twice daily for 1 month then may decrease to once daily as per prior prescription. (Patient not taking: Reported on 07/21/2023) 60 tablet 12   empagliflozin  (JARDIANCE ) 10 MG TABS tablet Take 1 tablet (10 mg total) by mouth daily before breakfast. (Patient not taking: Reported on 07/21/2023) 30 tablet 2   No facility-administered medications prior to visit.    Allergies  Allergen Reactions   Ozempic  (0.25 Or 0.5 Mg-Dose) [Semaglutide (0.25 Or  0.5mg -Dos)]     Abdominal pain    ROS Review of Systems  Constitutional:  Negative for appetite change, chills, fatigue and fever.  HENT:  Negative for congestion, postnasal drip, rhinorrhea and sneezing.   Respiratory:  Negative for cough, shortness of breath and wheezing.   Cardiovascular:  Negative for chest pain, palpitations and leg swelling.  Gastrointestinal:  Positive for abdominal pain and nausea. Negative for constipation and vomiting.  Genitourinary:  Negative for difficulty urinating, dysuria, flank pain and frequency.  Musculoskeletal:  Negative for arthralgias, back pain, joint swelling and myalgias.  Skin:  Negative for color change, pallor, rash and wound.       itching  Neurological:  Negative for dizziness, facial asymmetry, weakness, numbness and headaches.  Psychiatric/Behavioral:  Negative for behavioral problems, confusion, self-injury and suicidal ideas.       Objective:    Physical Exam Vitals and nursing note reviewed. Exam conducted with a chaperone present.  Constitutional:      General: She is not in acute distress.    Appearance: Normal appearance. She is obese. She is not ill-appearing, toxic-appearing or diaphoretic.   Eyes:     General: No scleral icterus.       Right eye: No discharge.        Left eye: No discharge.     Extraocular Movements: Extraocular movements intact.     Conjunctiva/sclera: Conjunctivae normal.    Cardiovascular:     Rate and Rhythm: Normal rate and regular rhythm.     Pulses: Normal pulses.     Heart sounds: Normal heart sounds. No murmur heard.    No friction rub. No gallop.  Pulmonary:     Effort: Pulmonary effort is normal. No respiratory distress.     Breath sounds: Normal breath sounds. No stridor. No wheezing, rhonchi or rales.  Chest:     Chest wall: No tenderness.  Abdominal:     General: There is no distension.     Palpations: Abdomen is soft.     Tenderness: There is abdominal tenderness. There is no  right CVA tenderness, left CVA tenderness or guarding.   Musculoskeletal:        General: No swelling, tenderness, deformity  or signs of injury.     Right lower leg: No edema.     Left lower leg: No edema.   Skin:    General: Skin is warm and dry.     Capillary Refill: Capillary refill takes less than 2 seconds.     Coloration: Skin is not jaundiced or pale.     Findings: No bruising, erythema or lesion.   Neurological:     Mental Status: She is alert and oriented to person, place, and time.     Motor: No weakness.     Gait: Gait normal.   Psychiatric:        Mood and Affect: Mood normal.        Behavior: Behavior normal.        Thought Content: Thought content normal.        Judgment: Judgment normal.     BP (!) 103/55   Pulse (!) 101   Temp (!) 97.1 F (36.2 C)   Wt 201 lb (91.2 kg)   LMP 06/16/2023 (Exact Date) Comment: Pregnancy POC test (-) neg on 07/04/2023  SpO2 98%   BMI 33.45 kg/m  Wt Readings from Last 3 Encounters:  07/21/23 201 lb (91.2 kg)  07/04/23 217 lb 6 oz (98.6 kg)  06/23/23 210 lb (95.3 kg)    Lab Results  Component Value Date   TSH 1.540 04/19/2022   Lab Results  Component Value Date   WBC 5.2 06/09/2023   HGB 8.8 (L) 07/05/2023   HCT 29.3 (L) 07/05/2023   MCV 77 (L) 06/09/2023   PLT 458 (H) 06/09/2023   Lab Results  Component Value Date   NA 133 (L) 07/05/2023   K 3.6 07/05/2023   CO2 20 (L) 07/05/2023   GLUCOSE 146 (H) 07/05/2023   BUN 10 07/05/2023   CREATININE 1.20 (H) 07/05/2023   BILITOT 0.3 06/09/2023   ALKPHOS 111 06/09/2023   AST 22 06/09/2023   ALT 19 06/09/2023   PROT 7.8 06/09/2023   ALBUMIN 4.2 06/09/2023   CALCIUM  8.3 (L) 07/05/2023   ANIONGAP 10 07/05/2023   EGFR 89 06/09/2023   Lab Results  Component Value Date   CHOL 203 (H) 06/09/2023   Lab Results  Component Value Date   HDL 53 06/09/2023   Lab Results  Component Value Date   LDLCALC 126 (H) 06/09/2023   Lab Results  Component Value Date    TRIG 134 06/09/2023   Lab Results  Component Value Date   CHOLHDL 3.8 06/09/2023   Lab Results  Component Value Date   HGBA1C 7.1 (A) 06/06/2023      Assessment & Plan:   Problem List Items Addressed This Visit       Cardiovascular and Mediastinum   Essential hypertension   BP Readings from Last 3 Encounters:  07/21/23 (!) 103/55  07/06/23 112/70  06/23/23 139/83    Controlled on valsartan  80 mg daily, spironolactone  25 mg daily, metoprolol  50 mg daily Continue current medications. No changes in management. Discussed DASH diet and dietary sodium restrictions Continue to increase dietary efforts and exercise.         Relevant Orders   Basic Metabolic Panel     Endocrine   Type 2 diabetes mellitus without complication, without long-term current use of insulin  (HCC) - Primary   Continue metformin  500 mg daily Patient counseled on low-carb diet Up-to-date with diabetic eye exam will request for records [facility] Recheck A1c in 2 months  Musculoskeletal and Integument   Dermatitis   Patient complains of itchy skin on her buttocks that started since her recent hospitalization.  No rashes or redness noted on examination  - triamcinolone cream (KENALOG) 0.1 %; Apply 1 Application topically 2 (two) times daily Encouraged to keep skin well moisturized Avoid hot shower/hot baths       Relevant Medications   triamcinolone cream (KENALOG) 0.1 %     Other   Primary insomnia   Continue trazodone  50 mg at bedtime as needed Home sleep study was ordered but has not been completed      Iron deficiency anemia   Lab Results  Component Value Date   WBC 5.2 06/09/2023   HGB 8.8 (L) 07/05/2023   HCT 29.3 (L) 07/05/2023   MCV 77 (L) 06/09/2023   PLT 458 (H) 06/09/2023  On ferrous sulfate  325 mg daily Checking CBC and iron      Relevant Orders   CBC   Iron, TIBC and Ferritin Panel   Dyslipidemia, goal LDL below 70   Checking lipid panel On  atorvastatin  10 mg daily Lab Results  Component Value Date   CHOL 203 (H) 06/09/2023   HDL 53 06/09/2023   LDLCALC 126 (H) 06/09/2023   LDLDIRECT 71 10/28/2022   TRIG 134 06/09/2023   CHOLHDL 3.8 06/09/2023         Relevant Orders   Lipid panel   Renal mass   Cystic clear cell renal cell carcinoma, nuclear grade 2, 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  Tumor is limited to the kidney .Status post left partial nephrectomy, Stated that she was told that she does not require additional treatments Encouraged to maintain close follow-up with urologist      Generalized abdominal pain   Patient referred to GI      Relevant Orders   Ambulatory referral to Gastroenterology   Electrolyte abnormality   Lab Results  Component Value Date   NA 133 (L) 07/05/2023   K 3.6 07/05/2023   CO2 20 (L) 07/05/2023   GLUCOSE 146 (H) 07/05/2023   BUN 10 07/05/2023   CREATININE 1.20 (H) 07/05/2023   CALCIUM  8.3 (L) 07/05/2023   EGFR 89 06/09/2023   GFRNONAA 56 (L) 07/05/2023  Rechecking labs      Relevant Orders   Basic Metabolic Panel    Meds ordered this encounter  Medications   triamcinolone cream (KENALOG) 0.1 %    Sig: Apply 1 Application topically 2 (two) times daily.    Dispense:  30 g    Refill:  0    Follow-up: Return in about 7 weeks (around 09/08/2023) for DM.    Nathalia Wismer R Rona Tomson, FNP

## 2023-07-21 NOTE — Assessment & Plan Note (Signed)
 Continue metformin  500 mg daily Patient counseled on low-carb diet Up-to-date with diabetic eye exam will request for records from [facility] Recheck A1c in 2 months

## 2023-07-21 NOTE — Assessment & Plan Note (Signed)
 Patient complains of itchy skin on her buttocks that started since her recent hospitalization.  No rashes or redness noted on examination  - triamcinolone cream (KENALOG) 0.1 %; Apply 1 Application topically 2 (two) times daily Encouraged to keep skin well moisturized Avoid hot shower/hot baths

## 2023-07-21 NOTE — Assessment & Plan Note (Signed)
 Continue trazodone  50 mg at bedtime as needed Home sleep study was ordered but has not been completed

## 2023-07-21 NOTE — Assessment & Plan Note (Signed)
 Lab Results  Component Value Date   NA 133 (L) 07/05/2023   K 3.6 07/05/2023   CO2 20 (L) 07/05/2023   GLUCOSE 146 (H) 07/05/2023   BUN 10 07/05/2023   CREATININE 1.20 (H) 07/05/2023   CALCIUM  8.3 (L) 07/05/2023   EGFR 89 06/09/2023   GFRNONAA 56 (L) 07/05/2023  Rechecking labs

## 2023-07-21 NOTE — Assessment & Plan Note (Signed)
 Patient referred to GI.

## 2023-07-21 NOTE — Assessment & Plan Note (Signed)
 Lab Results  Component Value Date   WBC 5.2 06/09/2023   HGB 8.8 (L) 07/05/2023   HCT 29.3 (L) 07/05/2023   MCV 77 (L) 06/09/2023   PLT 458 (H) 06/09/2023  On ferrous sulfate  325 mg daily Checking CBC and iron

## 2023-07-22 LAB — CBC
Hematocrit: 29.7 % — ABNORMAL LOW (ref 34.0–46.6)
Hemoglobin: 8.9 g/dL — ABNORMAL LOW (ref 11.1–15.9)
MCH: 22.9 pg — ABNORMAL LOW (ref 26.6–33.0)
MCHC: 30 g/dL — ABNORMAL LOW (ref 31.5–35.7)
MCV: 77 fL — ABNORMAL LOW (ref 79–97)
Platelets: 918 10*3/uL (ref 150–450)
RBC: 3.88 x10E6/uL (ref 3.77–5.28)
RDW: 16.9 % — ABNORMAL HIGH (ref 11.7–15.4)
WBC: 13.8 10*3/uL — ABNORMAL HIGH (ref 3.4–10.8)

## 2023-07-22 LAB — BASIC METABOLIC PANEL WITH GFR
BUN/Creatinine Ratio: 11 (ref 9–23)
BUN: 12 mg/dL (ref 6–24)
CO2: 21 mmol/L (ref 20–29)
Calcium: 10 mg/dL (ref 8.7–10.2)
Chloride: 94 mmol/L — ABNORMAL LOW (ref 96–106)
Creatinine, Ser: 1.14 mg/dL — ABNORMAL HIGH (ref 0.57–1.00)
Glucose: 120 mg/dL — ABNORMAL HIGH (ref 70–99)
Potassium: 4.6 mmol/L (ref 3.5–5.2)
Sodium: 132 mmol/L — ABNORMAL LOW (ref 134–144)
eGFR: 59 mL/min/{1.73_m2} — ABNORMAL LOW (ref >59)

## 2023-07-22 LAB — LIPID PANEL
Chol/HDL Ratio: 4.2 ratio (ref 0.0–4.4)
Cholesterol, Total: 127 mg/dL (ref 100–199)
HDL: 30 mg/dL — ABNORMAL LOW (ref >39)
LDL Chol Calc (NIH): 70 mg/dL (ref 0–99)
Triglycerides: 155 mg/dL — ABNORMAL HIGH (ref 0–149)
VLDL Cholesterol Cal: 27 mg/dL (ref 5–40)

## 2023-07-22 LAB — IRON,TIBC AND FERRITIN PANEL
Ferritin: 100 ng/mL (ref 15–150)
Iron Saturation: 2 % — CL (ref 15–55)
Iron: 8 ug/dL — CL (ref 27–159)
Total Iron Binding Capacity: 333 ug/dL (ref 250–450)
UIBC: 325 ug/dL (ref 131–425)

## 2023-07-23 NOTE — Progress Notes (Deleted)
  Cardiology Office Note:   Date:  07/23/2023  ID:  Kaylee Maxwell, DOB 30-Jun-1975, MRN [id number] PCP: Paseda, [name], FNP  [facility] Providers Cardiologist:  Ahmad Alert, MD {  History of Present Illness:   Kaylee Maxwell is a 48 y.o. previously seen by Dr. Alroy Aspen.  She has a history of PVCs ***      ***  female 48 y.o. female with a hx of palpitations for years.  We are asked by Paseda, [name], FNP to see her for  worsening palpitations .    Has single isolated palpitations.  These occur sporadically.  There is no particular time that she has them worse.  The episodes are not related to eating or drinking.  They are not related to change in position or exercise. The palpitations seem to be worse after she drinks coffee.   She does not get any regular exercise.  She works in the [employer] for [employer]   She stopped smoking this past December.  She eats an unrestricted diet.   May 10, 2022 Kaylee Maxwell is seen today for follow up of her PVCs and recent HTN She was seen 4 years ago for these PVCs We discussed starting her on metoprolol  but she wanted to wait and see if these resolve on their own.   Wt is 207 lbs - up 10 lbs from last year   Has cut back on her salt since finding this HTN  Is back exercising now ( was not prior to this diagnosis )    + fam hx of HTN      ROS: ***  Studies Reviewed:    EKG:       ***  Risk Assessment/Calculations:   {Does this patient have ATRIAL FIBRILLATION?:[id number]} No BP recorded.  {Refresh Note OR Click here to enter BP  :1}***        Physical Exam:   VS:  There were no vitals taken for this visit.   Wt Readings from Last 3 Encounters:  07/21/23 201 lb (91.2 kg)  07/04/23 217 lb 6 oz (98.6 kg)  06/23/23 210 lb (95.3 kg)     GEN: Well nourished, well developed in no acute distress NECK: No JVD; No carotid bruits CARDIAC: ***RR, *** murmurs, rubs, gallops RESPIRATORY:  Clear to auscultation  without rales, wheezing or rhonchi  ABDOMEN: Soft, non-tender, non-distended EXTREMITIES:  No edema; No deformity   ASSESSMENT AND PLAN:   PVCs :  *** still has some PVCs  .  Stable   HTN:   ***  she has not been taking very good care of herself.  She has gained some weight.  She has been eating lots of salty foods including processed meats.  We had a long discussion about improving her diet.  She has already started an exercise program.  Will discontinue the lisinopril  HCTZ.  Will start her on valsartan  80 mg a day and spironolactone  25 mg a day.  Will check a basic metabolic profile in 3 weeks.    I encouraged her to continue to work out.  She will watch her carbohydrates and in calories in general.  I will see her again in 3 months for follow-up visit     Follow up ***  Signed, Eilleen Grates, MD

## 2023-07-24 ENCOUNTER — Other Ambulatory Visit: Payer: Self-pay | Admitting: Nurse Practitioner

## 2023-07-24 ENCOUNTER — Ambulatory Visit: Payer: Self-pay | Admitting: Nurse Practitioner

## 2023-07-24 DIAGNOSIS — D509 Iron deficiency anemia, unspecified: Secondary | ICD-10-CM

## 2023-07-25 ENCOUNTER — Ambulatory Visit: Attending: Cardiology | Admitting: Cardiology

## 2023-07-25 DIAGNOSIS — I10 Essential (primary) hypertension: Secondary | ICD-10-CM

## 2023-07-25 DIAGNOSIS — I493 Ventricular premature depolarization: Secondary | ICD-10-CM

## 2023-07-25 DIAGNOSIS — R403 Persistent vegetative state: Secondary | ICD-10-CM

## 2023-07-26 ENCOUNTER — Encounter: Payer: Self-pay | Admitting: Cardiology

## 2023-07-26 IMAGING — US US ABDOMEN LIMITED
1 series · 14 of 25 positions shown · non-contrast
Comparison: None.

CLINICAL DATA: Elevated liver enzymes.

EXAM:
ULTRASOUND ABDOMEN LIMITED RIGHT UPPER QUADRANT

[Series 1: us abdomen limited · 14 of 54 slices shown]
[im 1/54]
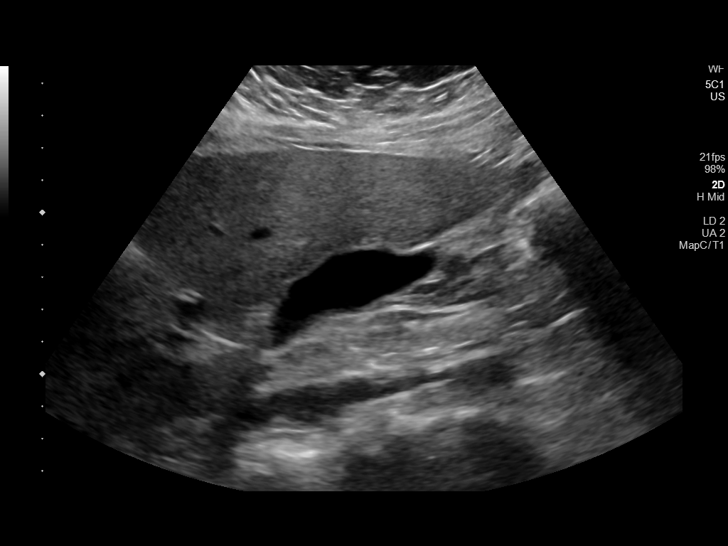
[im 5/54]
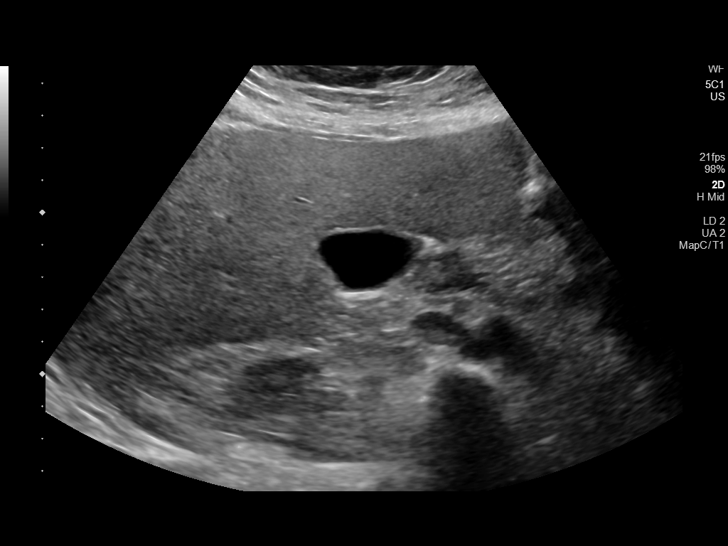
[im 9/54]
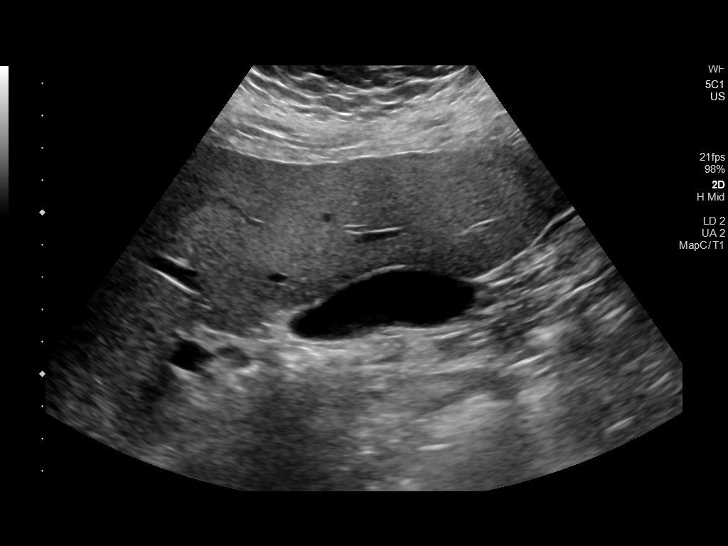
[im 14/54]
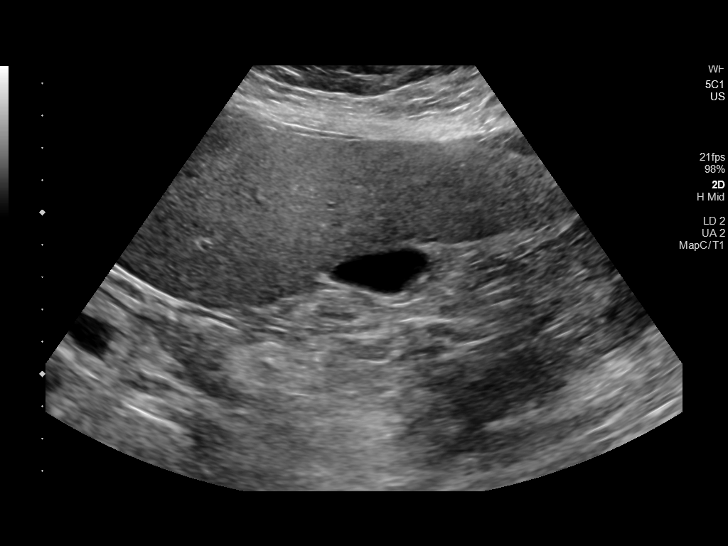
[im 18/54]
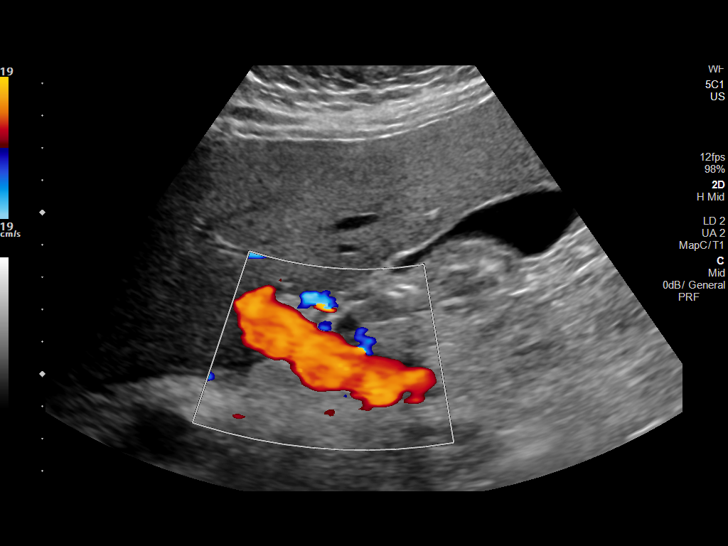
[im 20/54]
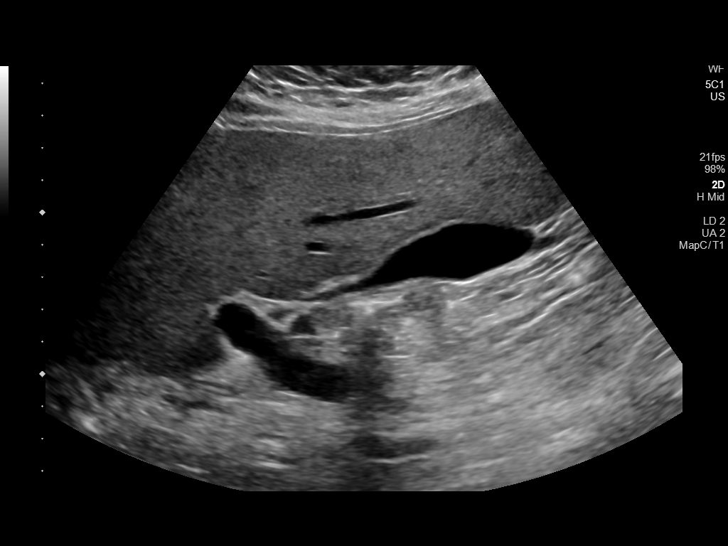
[im 25/54]
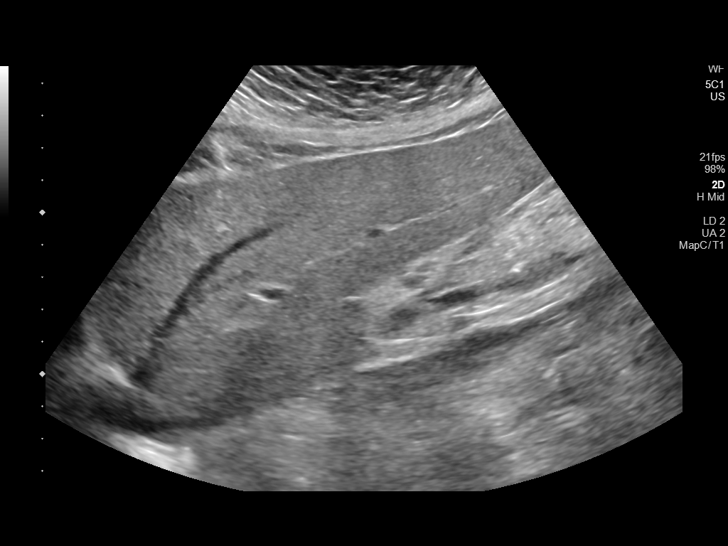
[im 29/54]
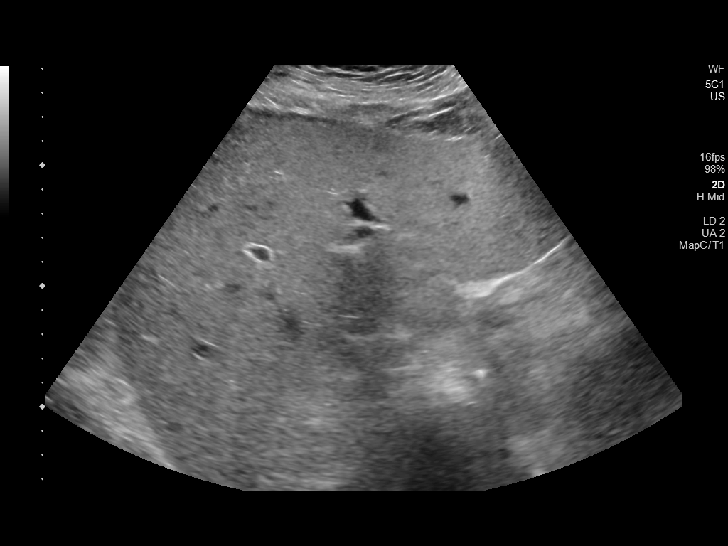
[im 34/54]
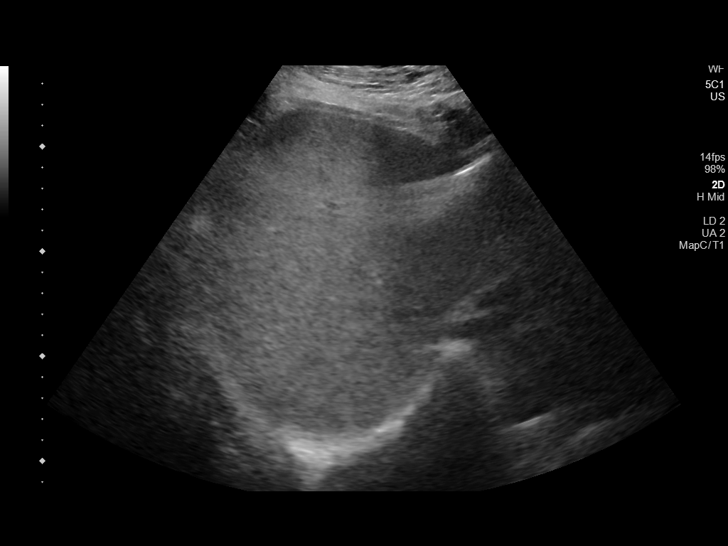
[im 36/54]
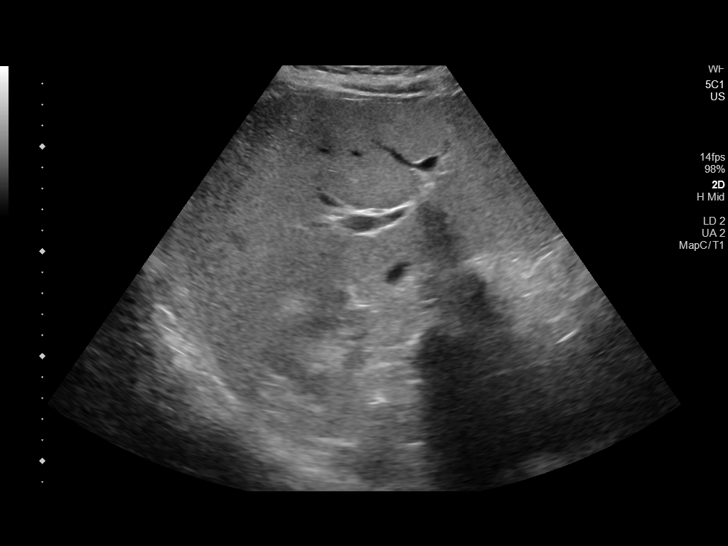
[im 40/54]
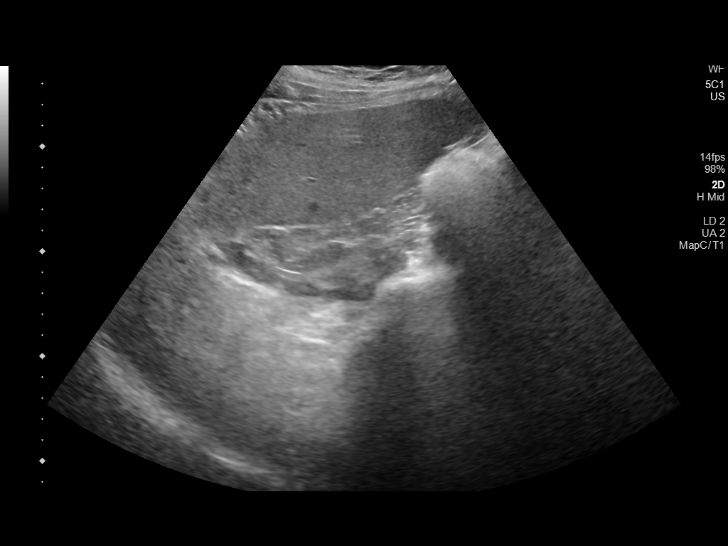
[im 45/54]
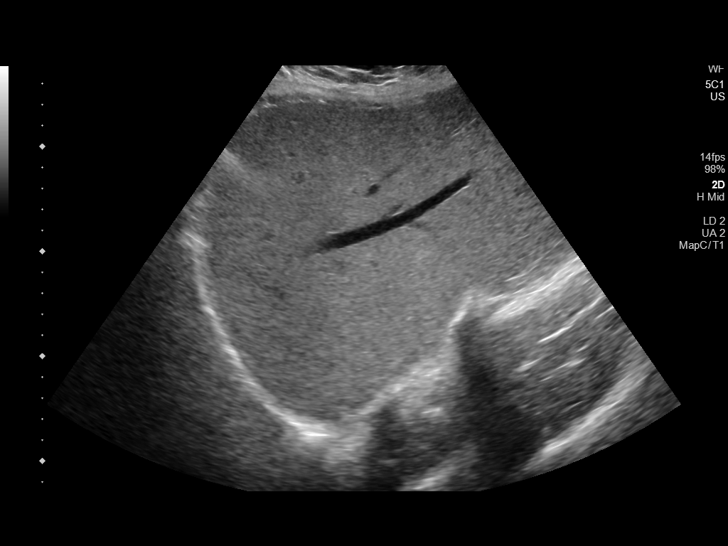
[im 49/54]
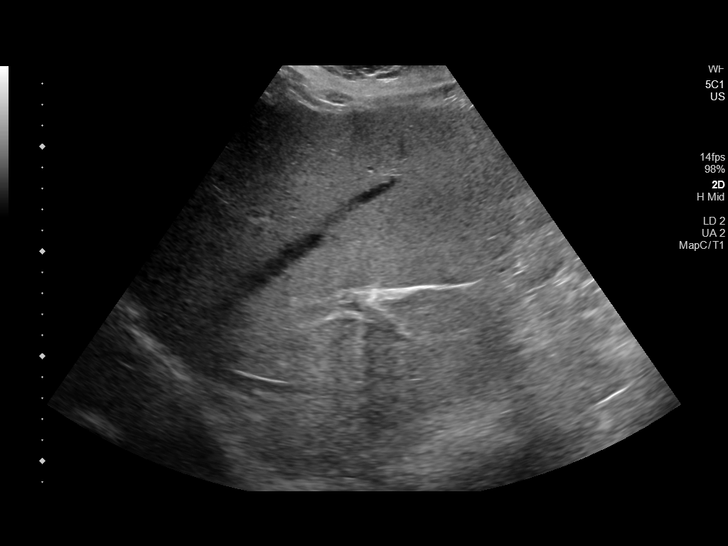
[im 54/54]
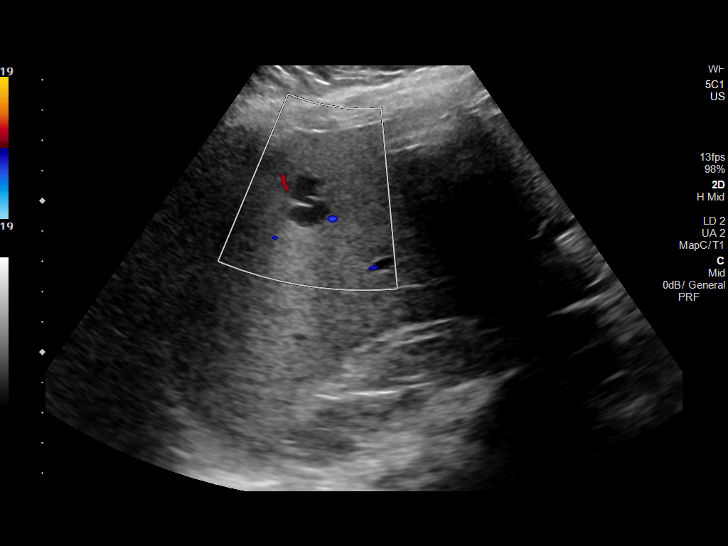

[14 of 25 positions shown; findings below may reference images not displayed]

FINDINGS: Gallbladder:

No gallstones or wall thickening visualized. No sonographic Murphy
sign noted by sonographer.

Common bile duct:

Diameter: 1 mm

Liver:

There is diffuse increased liver echogenicity most commonly seen in
the setting of fatty infiltration. Superimposed inflammation or
fibrosis is not excluded. Clinical correlation is recommended. There
is a 15 mm cyst in the right lobe of the liver. Portal vein is
patent on color Doppler imaging with normal direction of blood flow
towards the liver.

Other: None.
IMPRESSION: 1. Fatty liver and a small right liver cyst.
2. No gallstone.

## 2023-08-01 ENCOUNTER — Telehealth: Payer: Self-pay | Admitting: Cardiovascular Disease

## 2023-08-01 ENCOUNTER — Other Ambulatory Visit: Payer: Self-pay

## 2023-08-01 ENCOUNTER — Other Ambulatory Visit (HOSPITAL_COMMUNITY): Payer: Self-pay

## 2023-08-01 MED ORDER — VALSARTAN 80 MG PO TABS
80.0000 mg | ORAL_TABLET | Freq: Every day | ORAL | 0 refills | Status: DC
  Filled 2023-08-01: qty 30, 30d supply, fill #0

## 2023-08-01 MED ORDER — SPIRONOLACTONE 25 MG PO TABS
25.0000 mg | ORAL_TABLET | Freq: Every day | ORAL | 0 refills | Status: DC
  Filled 2023-08-01: qty 30, 30d supply, fill #0

## 2023-08-01 MED ORDER — METOPROLOL SUCCINATE ER 50 MG PO TB24
50.0000 mg | ORAL_TABLET | Freq: Every day | ORAL | 0 refills | Status: DC
  Filled 2023-08-01: qty 30, 30d supply, fill #0

## 2023-08-01 NOTE — Telephone Encounter (Signed)
*  STAT* If patient is at the pharmacy, call can be transferred to refill team.   1. Which medications need to be refilled? (please list name of each medication and dose if known)   metoprolol  succinate (TOPROL -XL) 50 MG 24 hr tablet  valsartan  (DIOVAN ) 80 MG tablet  spironolactone  (ALDACTONE ) 25 MG tablet  2. Which pharmacy/location (including street and city if local pharmacy) is medication to be sent to? [place] [facility]    3. Do they need a 30 day or 90 day supply? enough until next appt

## 2023-08-01 NOTE — Telephone Encounter (Signed)
 Pt's medications were sent to pt's pharmacy as requested. Confirmation received.

## 2023-08-08 ENCOUNTER — Other Ambulatory Visit: Payer: Self-pay

## 2023-08-08 ENCOUNTER — Inpatient Hospital Stay (HOSPITAL_COMMUNITY)
Admission: EM | Admit: 2023-08-08 | Discharge: 2023-08-10 | DRG: 391 | Disposition: A | Discharge status: Home / Self Care | Attending: Family Medicine | Admitting: Family Medicine

## 2023-08-08 ENCOUNTER — Encounter (HOSPITAL_COMMUNITY): Payer: Self-pay | Admitting: Student

## 2023-08-08 DIAGNOSIS — D509 Iron deficiency anemia, unspecified: Secondary | ICD-10-CM | POA: Diagnosis not present

## 2023-08-08 DIAGNOSIS — Z6833 Body mass index (BMI) 33.0-33.9, adult: Secondary | ICD-10-CM | POA: Diagnosis not present

## 2023-08-08 DIAGNOSIS — Z888 Allergy status to other drugs, medicaments and biological substances status: Secondary | ICD-10-CM | POA: Diagnosis not present

## 2023-08-08 DIAGNOSIS — I10 Essential (primary) hypertension: Secondary | ICD-10-CM | POA: Diagnosis present

## 2023-08-08 DIAGNOSIS — Z85528 Personal history of other malignant neoplasm of kidney: Secondary | ICD-10-CM

## 2023-08-08 DIAGNOSIS — K651 Peritoneal abscess: Secondary | ICD-10-CM | POA: Diagnosis present

## 2023-08-08 DIAGNOSIS — K5730 Diverticulosis of large intestine without perforation or abscess without bleeding: Secondary | ICD-10-CM | POA: Diagnosis not present

## 2023-08-08 DIAGNOSIS — E785 Hyperlipidemia, unspecified: Secondary | ICD-10-CM | POA: Diagnosis present

## 2023-08-08 DIAGNOSIS — K5720 Diverticulitis of large intestine with perforation and abscess without bleeding: Principal | ICD-10-CM | POA: Diagnosis present

## 2023-08-08 DIAGNOSIS — E119 Type 2 diabetes mellitus without complications: Secondary | ICD-10-CM | POA: Diagnosis present

## 2023-08-08 DIAGNOSIS — Z833 Family history of diabetes mellitus: Secondary | ICD-10-CM

## 2023-08-08 DIAGNOSIS — K5792 Diverticulitis of intestine, part unspecified, without perforation or abscess without bleeding: Secondary | ICD-10-CM | POA: Diagnosis present

## 2023-08-08 DIAGNOSIS — E66811 Obesity, class 1: Secondary | ICD-10-CM | POA: Diagnosis present

## 2023-08-08 DIAGNOSIS — Z79899 Other long term (current) drug therapy: Secondary | ICD-10-CM

## 2023-08-08 DIAGNOSIS — Z7984 Long term (current) use of oral hypoglycemic drugs: Secondary | ICD-10-CM | POA: Diagnosis not present

## 2023-08-08 DIAGNOSIS — Z8249 Family history of ischemic heart disease and other diseases of the circulatory system: Secondary | ICD-10-CM

## 2023-08-08 DIAGNOSIS — Z8719 Personal history of other diseases of the digestive system: Secondary | ICD-10-CM | POA: Diagnosis not present

## 2023-08-08 DIAGNOSIS — Z905 Acquired absence of kidney: Secondary | ICD-10-CM

## 2023-08-08 DIAGNOSIS — K5900 Constipation, unspecified: Secondary | ICD-10-CM | POA: Diagnosis not present

## 2023-08-08 DIAGNOSIS — Z7982 Long term (current) use of aspirin: Secondary | ICD-10-CM

## 2023-08-08 DIAGNOSIS — Z87891 Personal history of nicotine dependence: Secondary | ICD-10-CM

## 2023-08-08 DIAGNOSIS — R160 Hepatomegaly, not elsewhere classified: Secondary | ICD-10-CM | POA: Diagnosis not present

## 2023-08-08 DIAGNOSIS — N200 Calculus of kidney: Secondary | ICD-10-CM | POA: Diagnosis not present

## 2023-08-08 DIAGNOSIS — C642 Malignant neoplasm of left kidney, except renal pelvis: Secondary | ICD-10-CM | POA: Diagnosis not present

## 2023-08-08 HISTORY — DX: Diverticulitis of large intestine with perforation and abscess without bleeding: K57.20

## 2023-08-08 LAB — CBC
HCT: 28.1 % — ABNORMAL LOW (ref 36.0–46.0)
Hemoglobin: 8.3 g/dL — ABNORMAL LOW (ref 12.0–15.0)
MCH: 23.6 pg — ABNORMAL LOW (ref 26.0–34.0)
MCHC: 29.5 g/dL — ABNORMAL LOW (ref 30.0–36.0)
MCV: 79.8 fL — ABNORMAL LOW (ref 80.0–100.0)
Platelets: 371 10*3/uL (ref 150–400)
RBC: 3.52 MIL/uL — ABNORMAL LOW (ref 3.87–5.11)
RDW: 20.3 % — ABNORMAL HIGH (ref 11.5–15.5)
WBC: 5.6 10*3/uL (ref 4.0–10.5)
nRBC: 0 % (ref 0.0–0.2)

## 2023-08-08 LAB — COMPREHENSIVE METABOLIC PANEL WITH GFR
ALT: 14 U/L (ref 0–44)
AST: 17 U/L (ref 15–41)
Albumin: 3.5 g/dL (ref 3.5–5.0)
Alkaline Phosphatase: 83 U/L (ref 38–126)
Anion gap: 10 (ref 5–15)
BUN: 22 mg/dL — ABNORMAL HIGH (ref 6–20)
CO2: 20 mmol/L — ABNORMAL LOW (ref 22–32)
Calcium: 9 mg/dL (ref 8.9–10.3)
Chloride: 104 mmol/L (ref 98–111)
Creatinine, Ser: 1.17 mg/dL — ABNORMAL HIGH (ref 0.44–1.00)
GFR, Estimated: 58 mL/min — ABNORMAL LOW (ref >60)
Glucose, Bld: 102 mg/dL — ABNORMAL HIGH (ref 70–99)
Potassium: 4.3 mmol/L (ref 3.5–5.1)
Sodium: 134 mmol/L — ABNORMAL LOW (ref 135–145)
Total Bilirubin: 0.6 mg/dL (ref 0.0–1.2)
Total Protein: 8 g/dL (ref 6.5–8.1)

## 2023-08-08 LAB — GLUCOSE, CAPILLARY: Glucose-Capillary: 106 mg/dL — ABNORMAL HIGH (ref 70–99)

## 2023-08-08 LAB — HCG, SERUM, QUALITATIVE: Preg, Serum: NEGATIVE

## 2023-08-08 LAB — LIPASE, BLOOD: Lipase: 50 U/L (ref 11–51)

## 2023-08-08 MED ORDER — INSULIN ASPART 100 UNIT/ML IJ SOLN
0.0000 [IU] | Freq: Every day | INTRAMUSCULAR | Status: DC
  Filled 2023-08-08: qty 0.05

## 2023-08-08 MED ORDER — OXYCODONE HCL 5 MG PO TABS
5.0000 mg | ORAL_TABLET | Freq: Four times a day (QID) | ORAL | Status: DC | PRN
  Administered 2023-08-09 – 2023-08-10 (×3): 5 mg via ORAL
  Filled 2023-08-08 (×3): qty 1

## 2023-08-08 MED ORDER — ONDANSETRON HCL 4 MG PO TABS: 4.0000 mg | ORAL_TABLET | Freq: Four times a day (QID) | ORAL | Status: DC | PRN

## 2023-08-08 MED ORDER — ONDANSETRON HCL 4 MG/2ML IJ SOLN: 4.0000 mg | Freq: Four times a day (QID) | INTRAMUSCULAR | Status: DC | PRN

## 2023-08-08 MED ORDER — PIPERACILLIN-TAZOBACTAM 3.375 G IVPB
3.3750 g | Freq: Three times a day (TID) | INTRAVENOUS | Status: DC
  Administered 2023-08-09 – 2023-08-10 (×5): 3.375 g via INTRAVENOUS
  Filled 2023-08-08 (×5): qty 50

## 2023-08-08 MED ORDER — PIPERACILLIN-TAZOBACTAM 3.375 G IVPB 30 MIN
3.3750 g | Freq: Once | INTRAVENOUS | Status: AC
  Administered 2023-08-08: 3.375 g via INTRAVENOUS
  Filled 2023-08-08: qty 50

## 2023-08-08 MED ORDER — BOOST / RESOURCE BREEZE PO LIQD CUSTOM
1.0000 | Freq: Three times a day (TID) | ORAL | Status: DC
  Administered 2023-08-10: 1 via ORAL

## 2023-08-08 MED ORDER — INSULIN ASPART 100 UNIT/ML IJ SOLN
0.0000 [IU] | Freq: Three times a day (TID) | INTRAMUSCULAR | Status: DC
  Administered 2023-08-10: 2 [IU] via SUBCUTANEOUS
  Filled 2023-08-08: qty 0.09

## 2023-08-08 MED ORDER — TRAZODONE HCL 50 MG PO TABS: 50.0000 mg | ORAL_TABLET | Freq: Every evening | ORAL | Status: DC | PRN

## 2023-08-08 MED ORDER — SODIUM CHLORIDE 0.9 % IV SOLN: INTRAVENOUS | Status: AC

## 2023-08-08 MED ORDER — HYDROMORPHONE HCL 1 MG/ML IJ SOLN
0.5000 mg | INTRAMUSCULAR | Status: DC | PRN
  Administered 2023-08-08 – 2023-08-10 (×3): 0.5 mg via INTRAVENOUS
  Filled 2023-08-08: qty 1
  Filled 2023-08-08 (×2): qty 0.5

## 2023-08-08 MED ORDER — ATORVASTATIN CALCIUM 10 MG PO TABS
10.0000 mg | ORAL_TABLET | Freq: Every day | ORAL | Status: DC
  Administered 2023-08-09 – 2023-08-10 (×2): 10 mg via ORAL
  Filled 2023-08-08 (×2): qty 1

## 2023-08-08 MED ORDER — METOPROLOL SUCCINATE ER 50 MG PO TB24
50.0000 mg | ORAL_TABLET | Freq: Every day | ORAL | Status: DC
  Administered 2023-08-09 – 2023-08-10 (×2): 50 mg via ORAL
  Filled 2023-08-08 (×2): qty 1

## 2023-08-08 MED ORDER — ACETAMINOPHEN 650 MG RE SUPP: 650.0000 mg | Freq: Four times a day (QID) | RECTAL | Status: DC | PRN

## 2023-08-08 MED ORDER — ACETAMINOPHEN 325 MG PO TABS: 650.0000 mg | ORAL_TABLET | Freq: Four times a day (QID) | ORAL | Status: DC | PRN

## 2023-08-08 NOTE — ED Triage Notes (Signed)
 Pt arrived reporting abdominal pain, lower and nausea. States she had a scan today and was advised to come in for eval of diverticulitis. Recent sx on 08/04/2023, pt states they removed tumor from kidney. Denies any other issues.

## 2023-08-08 NOTE — H&P (Signed)
 History and Physical    Patient: Kaylee Maxwell FMW:[id number] DOB: 04-04-1975 DOA: 08/08/2023 DOS: the patient was seen and examined on 08/08/2023 PCP: Paseda, [name], FNP  Patient coming from: Home  Chief Complaint:  Chief Complaint  Patient presents with   Abdominal Pain   HPI: Kaylee Maxwell is a 48 y.o. female with PMH of DM-2, HTN, obesity, left renal mass s/p partial nephrectomy, IDA and palpitation sent to ED by urology due to abnormal CT.   Patient with left renal mass status post partial nephrectomy by Dr. Devere on 07/04/2023.  She has had  abdominal pain across lower abdomen since then.  She describes the pain as sore and achy.  No progression in pain.  She rates her pain 8-9 on a scale of 10 at its worst.  She has been managing pain with ibuprofen , Tylenol  and tramadol .  Sometimes, pain radiates to right upper quadrant.  She went to see her urologist today and had a CT renal stone study that showed distal sigmoid colon diverticulitis with small 2.4 x 2.3 cm abscess, and she was  advised to come to the ED.   Patient reports nausea but denies emesis.  She reports constipation but had a bowel movement this morning.  She denies diarrhea, melena or hematochezia.  She denies dysuria, frequency or urgency.  Denies fever or chills.  No prior history of diverticulitis.  Never had colonoscopy.   Patient denies smoking cigarette.  Admits to occasional alcohol.  Denies recreational drug use.  She is not interested in cardiopulmonary resuscitation in event of sudden cardiopulmonary arrest.  In ED, stable vitals.  No leukocytosis.  Hgb 8.3 (about baseline.  MCV 79.8.  Started on IV Zosyn .  Per EDP, Dr. Rubin with general surgery consulted and will evaluate patient in the morning.  They are recommended admission to medical team.  Review of Systems: As mentioned in the history of present illness. All other systems reviewed and are negative. Past Medical History:  Diagnosis Date    Anxiety 03/04/2018   Breast cancer screening 03/04/2018   Heart palpitations 03/04/2018   Monitor 9/21: normal sinus rhythm, sinus tachycardia, Avg HR 110; occ PVCs   Hypertension    Iron deficiency    Irregular heartbeat 03/04/2018   Mild mitral regurgitation    Obesity    Osteochondroma    PVC's (premature ventricular contractions)    Scalp mass 10/09/2012   Sinus tachycardia    Type 2 diabetes mellitus [facility])    Past Surgical History:  Procedure Laterality Date   ANKLE FRACTURE SURGERY Right    BIOPSY  02/02/2023   Procedure: BIOPSY;  Surgeon: Wilhelmenia Aloha Raddle., MD;  Location: [facility];  Service: Gastroenterology;;   ROMAYNE Right    cyst removal from scalp     ESOPHAGOGASTRODUODENOSCOPY N/A 02/02/2023   Procedure: ESOPHAGOGASTRODUODENOSCOPY (EGD);  Surgeon: Wilhelmenia Aloha Raddle., MD;  Location: [facility];  Service: Gastroenterology;  Laterality: N/A;   EUS N/A 02/02/2023   Procedure: UPPER ENDOSCOPIC ULTRASOUND (EUS) RADIAL;  Surgeon: Wilhelmenia Aloha Raddle., MD;  Location: [facility];  Service: Gastroenterology;  Laterality: N/A;   left leg bone removal     ROBOTIC ASSITED PARTIAL NEPHRECTOMY Left 07/04/2023   Procedure: XI ROBOTIC ASSITED LEFT  PARTIAL NEPHRECTOMY;  Surgeon: Devere Lonni Righter, MD;  Location: [facility];  Service: Urology;  Laterality: Left;  180 MINUTES   Social History:  reports that she has quit smoking. Her smoking use included cigarettes. She has a 9 pack-year smoking  history. She has never been exposed to tobacco smoke. She has never used smokeless tobacco. She reports current alcohol use. She reports that she does not use drugs.  Allergies  Allergen Reactions   Ozempic  (0.25 Or 0.5 Mg-Dose) [Semaglutide (0.25 Or 0.5mg -Dos)] Other (See Comments)    Abdominal pain    Family History  Problem Relation Age of Onset   Diabetes Mother    Hypertension Mother    Obesity Mother    Hypertension Father    Cancer Paternal Aunt         pancreatic   Cancer Maternal Grandmother        lung   Autism spectrum disorder Sister     Prior to Admission medications   Medication Sig Start Date End Date Taking? Authorizing Provider  atorvastatin  (LIPITOR) 10 MG tablet Take 1 tablet (10 mg total) by mouth daily. 06/06/23   Paseda, [name], FNP  Berberine Chloride (BERBERINE HCI PO) Take 2,000 mg by mouth daily.    [provider]  Blood Glucose Monitoring Suppl (FREESTYLE LITE) w/Device KIT Use as directed 04/19/22   Paseda, [name], FNP  docusate sodium  (COLACE) 100 MG capsule Take 1 capsule (100 mg total) by mouth 2 (two) times daily. 07/04/23   Cory Palma, PA-C  ferrous sulfate  325 (65 FE) MG EC tablet Take 1 tablet (325 mg total) by mouth daily. 06/13/23   Paseda, [name], FNP  glucose blood (GNP TRUE METRIX GLUCOSE STRIPS) test strip Use as instructed 04/19/22   Paseda, [name], FNP  HYDROcodone -acetaminophen  (NORCO/VICODIN) 5-325 MG tablet Take 1-2 tablets by mouth every 6 (six) hours as needed for moderate pain (pain score 4-6) or severe pain (pain score 7-10). 07/04/23   Cory Palma, PA-C  Lancets (FREESTYLE) lancets Use as directed 04/19/22   Paseda, [name], FNP  metFORMIN  (GLUCOPHAGE -XR) 500 MG 24 hr tablet Take 500 mg with breakfast daily by mouth  for one week , then increase to 500 mg by mouth twice daily with meal 06/07/23   Paseda, [name], FNP  metoprolol  succinate (TOPROL -XL) 50 MG 24 hr tablet Take 1 tablet (50 mg total) by mouth daily. Take with or immediately following a meal. Please schedule follow up appt for more refills 08/01/23   Nahser, Aleene PARAS, MD  ondansetron  (ZOFRAN -ODT) 4 MG disintegrating tablet Take 1 tablet (4 mg total) by mouth every 4 (four) hours as needed for nausea. 07/17/23     pantoprazole  (PROTONIX ) 40 MG tablet Take 1 tablet (40 mg total) by mouth 2 (two) times daily before a meal. Twice daily for 1 month then may decrease to once daily as per prior prescription. 02/02/23    Mansouraty, Aloha Raddle., MD  spironolactone  (ALDACTONE ) 25 MG tablet Take 1 tablet (25 mg total) by mouth daily. 08/01/23   Nahser, Aleene PARAS, MD  traMADol  (ULTRAM ) 50 MG tablet Take 1 tablet (50 mg total) by mouth every 4 (four) hours as needed. 07/10/23     traZODone  (DESYREL ) 50 MG tablet Take 1 tablet (50 mg total) by mouth at bedtime as needed for sleep 05/03/23   Paseda, [name], FNP  triamcinolone  cream (KENALOG ) 0.1 % Apply 1 Application topically 2 (two) times daily. 07/21/23   Paseda, [name], FNP  valsartan  (DIOVAN ) 80 MG tablet Take 1 tablet (80 mg total) by mouth daily. 08/01/23   Nahser, Aleene PARAS, MD  cetirizine  (ZYRTEC  ALLERGY) 10 MG tablet Take 1 tablet (10 mg total) by mouth daily. 03/04/20 04/20/20  Christopher Savannah, PA-C  Physical Exam: Vitals:   08/08/23 1750 08/08/23 1754  BP: (!) 125/54   Pulse: 88   Resp: 18   Temp: 98.6 F (37 C)   SpO2: 100%   Weight:  91 kg  Height:  5' 5 (1.651 m)   GENERAL: No apparent distress.  Nontoxic. HEENT: MMM.  Vision and hearing grossly intact.  NECK: Supple.  No apparent JVD.  RESP:  No IWOB.  Fair aeration bilaterally. CVS:  RRR. Heart sounds normal.  ABD/GI/GU: BS+. Abd soft.  LLQ tenderness.  No rebound or guarding. MSK/EXT:   No apparent deformity. Moves extremities. No edema.  SKIN: no apparent skin lesion or wound NEURO: Awake and alert. Oriented appropriately.  No apparent focal neuro deficit. PSYCH: Calm. Normal affect.  Data Reviewed: See HPI  Assessment and Plan: Abscess of sigmoid colon due to diverticulitis: Per EDP who has access to outpatient imaging, CT renal stone study at urology office distal sigmoid colon diverticulitis with small 2.4 x 2.3 cm abscess.  Patient has had abdominal pain across lower abdomen since her nephrectomy.  She also reports some constipation and nausea but had normal bowel movement today.  No fever or leukocytosis.  Exam with LLQ tenderness but no rebound or guarding. - Continue IV  Zosyn -pharmacy to dose - Clear liquid diet and n.p.o. after midnight in case need for IR drain placement - Needs colonoscopy once she completes treatment for infection  NIDDM-2: A1c 7.1% in 05/2023.  On metformin  at home. -CBG monitoring and SSI-sensitive  Essential hypertension: Normotensive - Resume home meds as appropriate after med rec  Renal mass/RCC s/p left partial nephrectomy by Dr. Devere on 5/27.  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 - Outpatient follow-up with urology  Iron deficiency anemia: Denies overt bleeding such as melena, hematochezia or hematuria.  Currently stable. Recent Labs    12/05/22 1139 12/06/22 0815 12/07/22 0700 06/09/23 1330 07/04/23 1113 07/05/23 0519 07/05/23 0933 07/21/23 1457 08/08/23 1809  HGB 11.6* 10.6* 10.0* 10.5* 9.9* 8.7* 8.8* 8.9* 8.3*  - Continue monitoring - Continue home ferrous sulfate   Hyperlipidemia - Resume home Lipitor after med rec  History of palpitation: Patient does not recall this but she seems to be on Toprol -XL - Resume med after med rec  History of gastritis/duodenitis - Continue home Protonix  after med rec  Class I obesity Body mass index is 33.38 kg/m.        Advance Care Planning:   Code Status: Full Code-discussed with patient  Consults: General Surgery  Family Communication: Updated patient's son at bedside  Severity of Illness: The appropriate patient status for this patient is INPATIENT. Inpatient status is judged to be reasonable and necessary in order to provide the required intensity of service to ensure the patient's safety. The patient's presenting symptoms, physical exam findings, and initial radiographic and laboratory data in the context of their chronic comorbidities is felt to place them at high risk for further clinical deterioration. Furthermore, it is not anticipated that the patient will be medically stable for discharge from the hospital within 2 midnights of admission.   * I  certify that at the point of admission it is my clinical judgment that the patient will require inpatient hospital care spanning beyond 2 midnights from the point of admission due to high intensity of service, high risk for further deterioration and high frequency of surveillance required.*  Author: Mignon ONEIDA Bump, MD 08/08/2023 7:57 PM  For on call review [email or web address].

## 2023-08-08 NOTE — ED Provider Notes (Signed)
 [place] [facility] DEPART[facility] AT [facility] Provider Note   CSN: [id number] Arrival date & time: 08/08/23  1744     Patient presents with: Abdominal Pain   Kaylee Maxwell is a 48 y.o. female.   48 year old female with prior medical history as detailed below presents for evaluation.  Patient received CT urogram today with urology.  Results demonstrated evidence of distal sigmoid colon diverticulitis with small 2.4 x 2.3 cm abscess.  Patient without prior reported history of diverticulitis.  Patient complains of diffuse suprapubic pain.  She denies fever.  She denies nausea or vomiting.  Patient's status post left partial nephrectomy with Dr. Carolynn on May 27.    The history is provided by the patient and medical records.       Prior to Admission medications   Medication Sig Start Date End Date Taking? Authorizing Provider  atorvastatin  (LIPITOR) 10 MG tablet Take 1 tablet (10 mg total) by mouth daily. 06/06/23   Paseda, [name], FNP  Berberine Chloride (BERBERINE HCI PO) Take 2,000 mg by mouth daily.    [provider]  Blood Glucose Monitoring Suppl (FREESTYLE LITE) w/Device KIT Use as directed Patient not taking: Reported on 07/21/2023 04/19/22   Paseda, [name], FNP  docusate sodium  (COLACE) 100 MG capsule Take 1 capsule (100 mg total) by mouth 2 (two) times daily. Patient not taking: Reported on 07/21/2023 07/04/23   Cory Palma, PA-C  ferrous sulfate  325 (65 FE) MG EC tablet Take 1 tablet (325 mg total) by mouth daily. 06/13/23   Paseda, [name], FNP  glucose blood (GNP TRUE METRIX GLUCOSE STRIPS) test strip Use as instructed Patient not taking: Reported on 07/21/2023 04/19/22   Paseda, [name], FNP  HYDROcodone -acetaminophen  (NORCO/VICODIN) 5-325 MG tablet Take 1-2 tablets by mouth every 6 (six) hours as needed for moderate pain (pain score 4-6) or severe pain (pain score 7-10). Patient not taking: Reported on 07/21/2023 07/04/23   Cory Palma, PA-C  Lancets (FREESTYLE) lancets Use as directed Patient not taking: Reported on 07/21/2023 04/19/22   Paseda, [name], FNP  metFORMIN  (GLUCOPHAGE -XR) 500 MG 24 hr tablet Take 500 mg with breakfast daily by mouth  for one week , then increase to 500 mg by mouth twice daily with meal 06/07/23   Paseda, [name], FNP  metoprolol  succinate (TOPROL -XL) 50 MG 24 hr tablet Take 1 tablet (50 mg total) by mouth daily. Take with or immediately following a meal. Please schedule follow up appt for more refills 08/01/23   Nahser, Aleene PARAS, MD  ondansetron  (ZOFRAN -ODT) 4 MG disintegrating tablet Take 1 tablet (4 mg total) by mouth every 4 (four) hours as needed for nausea. 07/17/23     pantoprazole  (PROTONIX ) 40 MG tablet Take 1 tablet (40 mg total) by mouth 2 (two) times daily before a meal. Twice daily for 1 month then may decrease to once daily as per prior prescription. Patient not taking: Reported on 07/21/2023 02/02/23   Mansouraty, Aloha Raddle., MD  spironolactone  (ALDACTONE ) 25 MG tablet Take 1 tablet (25 mg total) by mouth daily. 08/01/23   Nahser, Aleene PARAS, MD  traMADol  (ULTRAM ) 50 MG tablet Take 1 tablet (50 mg total) by mouth every 4 (four) hours as needed. 07/10/23     traZODone  (DESYREL ) 50 MG tablet Take 1 tablet (50 mg total) by mouth at bedtime as needed for sleep 05/03/23   Paseda, [name], FNP  triamcinolone  cream (KENALOG ) 0.1 % Apply 1 Application topically 2 (two) times  daily. 07/21/23   Paseda, [name], FNP  valsartan  (DIOVAN ) 80 MG tablet Take 1 tablet (80 mg total) by mouth daily. 08/01/23   Nahser, Aleene PARAS, MD  cetirizine  (ZYRTEC  ALLERGY) 10 MG tablet Take 1 tablet (10 mg total) by mouth daily. 03/04/20 04/20/20  Christopher Savannah, PA-C    Allergies: Ozempic  (0.25 or 0.5 mg-dose) [semaglutide (0.25 or 0.5mg -dos)]    Review of Systems  All other systems reviewed and are negative.   Updated Vital Signs BP (!) 125/54 (BP Location: Left Arm)   Pulse 88   Temp 98.6 F (37 C)    Resp 18   Ht 5' 5 (1.651 m)   Wt 91 kg   SpO2 100%   BMI 33.38 kg/m   Physical Exam Vitals and nursing note reviewed.  Constitutional:      General: She is not in acute distress.    Appearance: Normal appearance. She is well-developed.  HENT:     Head: Normocephalic and atraumatic.   Eyes:     Conjunctiva/sclera: Conjunctivae normal.     Pupils: Pupils are equal, round, and reactive to light.    Cardiovascular:     Rate and Rhythm: Normal rate and regular rhythm.     Heart sounds: Normal heart sounds.  Pulmonary:     Effort: Pulmonary effort is normal. No respiratory distress.     Breath sounds: Normal breath sounds.  Abdominal:     General: There is no distension.     Palpations: Abdomen is soft.     Tenderness: There is abdominal tenderness in the suprapubic area.   Musculoskeletal:        General: No deformity. Normal range of motion.     Cervical back: Normal range of motion and neck supple.   Skin:    General: Skin is warm and dry.   Neurological:     General: No focal deficit present.     Mental Status: She is alert and oriented to person, place, and time.     (all labs ordered are listed, but only abnormal results are displayed) Labs Reviewed  COMPREHENSIVE METABOLIC PANEL WITH GFR - Abnormal; Notable for the following components:      Result Value   Sodium 134 (*)    CO2 20 (*)    Glucose, Bld 102 (*)    BUN 22 (*)    Creatinine, Ser 1.17 (*)    GFR, Estimated 58 (*)    All other components within normal limits  CBC - Abnormal; Notable for the following components:   RBC 3.52 (*)    Hemoglobin 8.3 (*)    HCT 28.1 (*)    MCV 79.8 (*)    MCH 23.6 (*)    MCHC 29.5 (*)    RDW 20.3 (*)    All other components within normal limits  LIPASE, BLOOD  URINALYSIS, ROUTINE W REFLEX MICROSCOPIC  HCG, SERUM, QUALITATIVE    EKG: None  Radiology: No results found.   Procedures   Medications Ordered in the ED  piperacillin -tazobactam (ZOSYN ) IVPB  3.375 g (has no administration in time range)                                    Medical Decision Making Amount and/or Complexity of Data Reviewed Labs: ordered.  Risk Prescription drug management. Decision regarding hospitalization.    Medical Screen Complete  This patient presented to the ED with complaint  of abd pain.  This complaint involves an extensive number of treatment options. The initial differential diagnosis includes, but is not limited to, diverticulitis  This presentation is: Acute, Chronic, Self-Limited, Previously Undiagnosed, Uncertain Prognosis, Complicated, Systemic Symptoms, and Threat to Life/Bodily Function  Patient with acute diverticulitis with perforation and associated abscess identified on outpatient CT imaging.  Patient would benefit from IV antibiotics, admission.  Dr. Rubin with general surgery aware of case and will consult.  Hospitalist service is aware of case and evaluate for admission.  Additional history obtained: External records from outside sources obtained and reviewed including prior ED visits and prior Inpatient records.   Problem List / ED Course:  Acute diverticulitis  Disposition:  After consideration of the diagnostic results and the patients response to treatment, I feel that the patent would benefit from admission.       Final diagnoses:  Diverticulitis    ED Discharge Orders     None          Laurice Maude BROCKS, MD 08/08/23 1944

## 2023-08-08 NOTE — ED Notes (Signed)
 ED TO INPATIENT HANDOFF REPORT  Name/Age/Gender Kaylee Maxwell 48 y.o. female  Code Status    Code Status Orders  (From admission, onward)           Start     Ordered   08/08/23 1956  Full code  Continuous       Question:  By:  Answer:  Consent: discussion documented in EHR   08/08/23 1956           Code Status History     Date Active Date Inactive Code Status Order ID Comments User Context   07/04/2023 1309 07/06/2023 1827 Full Code [id number]  Cory Alan RIGGERS Inpatient   12/05/2022 1948 12/07/2022 1916 Full Code [id number]  Keturah Carrier, MD ED       Home/SNF/Other Home  Chief Complaint Abscess of sigmoid colon due to diverticulitis [K57.20]  Level of Care/Admitting Diagnosis ED Disposition     ED Disposition  Admit   Condition  --   Comment  Hospital Area: [facility] [[id number]]  Level of Care: Med-Surg [16]  May admit patient to [facility] or [facility] if equivalent level of care is available:: No  Covid Evaluation: Asymptomatic - no recent exposure (last 10 days) testing not required  Diagnosis: Abscess of sigmoid colon due to diverticulitis [8450233]  Admitting Physician: KATHRIN MIGNON DASEN [[id number]]  Attending Physician: GONFA, TAYE T [[id number]]  Certification:: I certify this patient will need inpatient services for at least 2 midnights  Expected Medical Readiness: 08/11/2023          Medical History Past Medical History:  Diagnosis Date   Anxiety 03/04/2018   Breast cancer screening 03/04/2018   Heart palpitations 03/04/2018   Monitor 9/21: normal sinus rhythm, sinus tachycardia, Avg HR 110; occ PVCs   Hypertension    Iron deficiency    Irregular heartbeat 03/04/2018   Mild mitral regurgitation    Obesity    Osteochondroma    PVC's (premature ventricular contractions)    Scalp mass 10/09/2012   Sinus tachycardia    Type 2 diabetes mellitus (HCC)     Allergies Allergies  Allergen Reactions   Ozempic  (0.25  Or 0.5 Mg-Dose) [Semaglutide (0.25 Or 0.5mg -Dos)] Other (See Comments)    Abdominal pain    IV Location/Drains/Wounds Patient Lines/Drains/Airways Status     Active Line/Drains/Airways     Name Placement date Placement time Site Days   Peripheral IV 08/08/23 20 G Right Antecubital 08/08/23  1915  Antecubital  less than 1   Incision - 4 Ports Abdomen Left;Medial;Upper Left;Medial;Mid Left;Medial;Lower Lower;Medial 07/04/23  1029  -- 35            Labs/Imaging Results for orders placed or performed during the hospital encounter of 08/08/23 (from the past 48 hours)  Lipase, blood     Status: None   Collection Time: 08/08/23  6:09 PM  Result Value Ref Range   Lipase 50 11 - 51 U/L    Comment: Performed at [facility], [address] [place], [place] [address]  Comprehensive metabolic panel     Status: Abnormal   Collection Time: 08/08/23  6:09 PM  Result Value Ref Range   Sodium 134 (L) 135 - 145 mmol/L   Potassium 4.3 3.5 - 5.1 mmol/L   Chloride 104 98 - 111 mmol/L   CO2 20 (L) 22 - 32 mmol/L   Glucose, Bld 102 (H) 70 - 99 mg/dL    Comment: Glucose reference range applies only to samples  taken after fasting for at least 8 hours.   BUN 22 (H) 6 - 20 mg/dL   Creatinine, Ser 8.82 (H) 0.44 - 1.00 mg/dL   Calcium  9.0 8.9 - 10.3 mg/dL   Total Protein 8.0 6.5 - 8.1 g/dL   Albumin 3.5 3.5 - 5.0 g/dL   AST 17 15 - 41 U/L   ALT 14 0 - 44 U/L   Alkaline Phosphatase 83 38 - 126 U/L   Total Bilirubin 0.6 0.0 - 1.2 mg/dL   GFR, Estimated 58 (L) >60 mL/min    Comment: (NOTE) Calculated using the CKD-EPI Creatinine Equation (2021)    Anion gap 10 5 - 15    Comment: Performed at [facility], [address], [place], [place] [address]  CBC     Status: Abnormal   Collection Time: 08/08/23  6:09 PM  Result Value Ref Range   WBC 5.6 4.0 - 10.5 K/uL   RBC 3.52 (L) 3.87 - 5.11 MIL/uL   Hemoglobin 8.3 (L) 12.0 - 15.0 g/dL   HCT 71.8 (L) 63.9 - 53.9  %   MCV 79.8 (L) 80.0 - 100.0 fL   MCH 23.6 (L) 26.0 - 34.0 pg   MCHC 29.5 (L) 30.0 - 36.0 g/dL   RDW 79.6 (H) 88.4 - 84.4 %   Platelets 371 150 - 400 K/uL   nRBC 0.0 0.0 - 0.2 %    Comment: Performed at [facility], [address], [place], [place] [address]   No results found.  Pending Labs Unresulted Labs (From admission, onward)     Start     Ordered   08/08/23 1809  Urinalysis, Routine w reflex microscopic -Urine, Clean Catch  Once,   URGENT       Question:  Specimen Source  Answer:  Urine, Clean Catch   08/08/23 1808   08/08/23 1809  hCG, serum, qualitative  Once,   URGENT        08/08/23 1808            Vitals/Pain Today's Vitals   08/08/23 1750 08/08/23 1754  BP: (!) 125/54   Pulse: 88   Resp: 18   Temp: 98.6 F (37 C)   SpO2: 100%   Weight:  91 kg  Height:  5' 5 (1.651 m)  PainSc:  4     Isolation Precautions No active isolations  Medications Medications  acetaminophen  (TYLENOL ) tablet 650 mg (has no administration in time range)    Or  acetaminophen  (TYLENOL ) suppository 650 mg (has no administration in time range)  HYDROmorphone  (DILAUDID ) injection 0.5 mg (has no administration in time range)  oxyCODONE  (Oxy IR/ROXICODONE ) immediate release tablet 5 mg (has no administration in time range)  ondansetron  (ZOFRAN ) tablet 4 mg (has no administration in time range)    Or  ondansetron  (ZOFRAN ) injection 4 mg (has no administration in time range)  insulin  aspart (novoLOG ) injection 0-9 Units (has no administration in time range)  insulin  aspart (novoLOG ) injection 0-5 Units (has no administration in time range)  0.9 %  sodium chloride  infusion (has no administration in time range)  piperacillin -tazobactam (ZOSYN ) IVPB 3.375 g (3.375 g Intravenous New Bag/Given 08/08/23 1922)    Mobility walks

## 2023-08-08 NOTE — Plan of Care (Signed)

## 2023-08-09 ENCOUNTER — Ambulatory Visit: Payer: Self-pay | Admitting: Nurse Practitioner

## 2023-08-09 DIAGNOSIS — K5720 Diverticulitis of large intestine with perforation and abscess without bleeding: Secondary | ICD-10-CM | POA: Diagnosis not present

## 2023-08-09 LAB — GLUCOSE, CAPILLARY
Glucose-Capillary: 106 mg/dL — ABNORMAL HIGH (ref 70–99)
Glucose-Capillary: 96 mg/dL (ref 70–99)
Glucose-Capillary: 118 mg/dL — ABNORMAL HIGH (ref 70–99)
Glucose-Capillary: 103 mg/dL — ABNORMAL HIGH (ref 70–99)

## 2023-08-09 MED ORDER — FLORANEX PO PACK
1.0000 g | PACK | Freq: Three times a day (TID) | ORAL | Status: DC
  Administered 2023-08-09 – 2023-08-10 (×4): 1 g via ORAL
  Filled 2023-08-09 (×5): qty 1

## 2023-08-09 NOTE — Consult Note (Addendum)
 HUONG LUTHI October 19, 1975  [id number].    Requesting MD: Kathrin, MD Chief Complaint/Reason for Consult: diverticulitis with abscess  HPI:  Kaylee Maxwell is a 48 y/o F with PMH DM-2, HTN, obesity, left renal mass s/p partial nephrectomy 07/04/2023 (path: renal cell carcinoma) , and iron-deficiency anemia who presents with abdominal pain and nausea. She reports abdominal pain, worse across her lower abdomen, since her above operation. Pain is described as worse with movement. She reports nausea that is intermittent. Denies vomiting. Reports mild constipation that is relieved by miralax, last BM was yesterday and described as semi-solid and brown. Patient denies a history of diverticulitis, denies history of colonoscopy. Reports negative cologaurd test 2 years ago.  Former smoker.   ROS: Review of Systems  All other systems reviewed and are negative.   Family History  Problem Relation Age of Onset   Diabetes Mother    Hypertension Mother    Obesity Mother    Hypertension Father    Cancer Paternal Aunt        pancreatic   Cancer Maternal Grandmother        lung   Autism spectrum disorder Sister     Past Medical History:  Diagnosis Date   Anxiety 03/04/2018   Breast cancer screening 03/04/2018   Heart palpitations 03/04/2018   Monitor 9/21: normal sinus rhythm, sinus tachycardia, Avg HR 110; occ PVCs   Hypertension    Iron deficiency    Irregular heartbeat 03/04/2018   Mild mitral regurgitation    Obesity    Osteochondroma    PVC's (premature ventricular contractions)    Scalp mass 10/09/2012   Sinus tachycardia    Type 2 diabetes mellitus (HCC)     Past Surgical History:  Procedure Laterality Date   ANKLE FRACTURE SURGERY Right    BIOPSY  02/02/2023   Procedure: BIOPSY;  Surgeon: Wilhelmenia Aloha Raddle., MD;  Location: [facility];  Service: Gastroenterology;;   ROMAYNE Right    cyst removal from scalp     ESOPHAGOGASTRODUODENOSCOPY N/A 02/02/2023    Procedure: ESOPHAGOGASTRODUODENOSCOPY (EGD);  Surgeon: Wilhelmenia Aloha Raddle., MD;  Location: [facility];  Service: Gastroenterology;  Laterality: N/A;   EUS N/A 02/02/2023   Procedure: UPPER ENDOSCOPIC ULTRASOUND (EUS) RADIAL;  Surgeon: Wilhelmenia Aloha Raddle., MD;  Location: [facility];  Service: Gastroenterology;  Laterality: N/A;   left leg bone removal     ROBOTIC ASSITED PARTIAL NEPHRECTOMY Left 07/04/2023   Procedure: XI ROBOTIC ASSITED LEFT  PARTIAL NEPHRECTOMY;  Surgeon: Devere Lonni Righter, MD;  Location: [facility];  Service: Urology;  Laterality: Left;  180 MINUTES    Social History:  reports that she has quit smoking. Her smoking use included cigarettes. She has a 9 pack-year smoking history. She has never been exposed to tobacco smoke. She has never used smokeless tobacco. She reports current alcohol use. She reports that she does not use drugs.  Allergies:  Allergies  Allergen Reactions   Ozempic  (0.25 Or 0.5 Mg-Dose) [Semaglutide (0.25 Or 0.5mg -Dos)] Other (See Comments)    Abdominal pain    Medications Prior to Admission  Medication Sig Dispense Refill   aspirin  EC 81 MG tablet Take 81 mg by mouth in the morning. Swallow whole.     atorvastatin  (LIPITOR) 10 MG tablet Take 1 tablet (10 mg total) by mouth daily. 90 tablet 1   ferrous sulfate  325 (65 FE) MG EC tablet Take 1 tablet (325 mg total) by mouth daily. (Patient taking differently: Take 325 mg by mouth  daily with breakfast.) 90 tablet 1   metFORMIN  (GLUCOPHAGE -XR) 500 MG 24 hr tablet Take 500 mg with breakfast daily by mouth  for one week , then increase to 500 mg by mouth twice daily with meal (Patient taking differently: Take 500 mg by mouth 2 (two) times daily with a meal.) 180 tablet 1   metoprolol  succinate (TOPROL -XL) 50 MG 24 hr tablet Take 1 tablet (50 mg total) by mouth daily. Take with or immediately following a meal. Please schedule follow up appt for more refills 30 tablet 0   spironolactone  (ALDACTONE )  25 MG tablet Take 1 tablet (25 mg total) by mouth daily. 30 tablet 0   traZODone  (DESYREL ) 50 MG tablet Take 1 tablet (50 mg total) by mouth at bedtime as needed for sleep 90 tablet 1   TYLENOL  8 HOUR ARTHRITIS PAIN 650 MG CR tablet Take 650-1,300 mg by mouth every 8 (eight) hours as needed for pain.     valsartan  (DIOVAN ) 80 MG tablet Take 1 tablet (80 mg total) by mouth daily. 30 tablet 0   Blood Glucose Monitoring Suppl (FREESTYLE LITE) w/Device KIT Use as directed 1 kit 0   docusate sodium  (COLACE) 100 MG capsule Take 1 capsule (100 mg total) by mouth 2 (two) times daily. (Patient not taking: Reported on 08/08/2023)     glucose blood (GNP TRUE METRIX GLUCOSE STRIPS) test strip Use as instructed 100 each 12   HYDROcodone -acetaminophen  (NORCO/VICODIN) 5-325 MG tablet Take 1-2 tablets by mouth every 6 (six) hours as needed for moderate pain (pain score 4-6) or severe pain (pain score 7-10). (Patient not taking: Reported on 08/08/2023) 20 tablet 0   Lancets (FREESTYLE) lancets Use as directed 100 each 12   ondansetron  (ZOFRAN -ODT) 4 MG disintegrating tablet Take 1 tablet (4 mg total) by mouth every 4 (four) hours as needed for nausea. (Patient not taking: Reported on 08/08/2023) 30 tablet 3   pantoprazole  (PROTONIX ) 40 MG tablet Take 1 tablet (40 mg total) by mouth 2 (two) times daily before a meal. Twice daily for 1 month then may decrease to once daily as per prior prescription. (Patient not taking: Reported on 08/08/2023) 60 tablet 12   traMADol  (ULTRAM ) 50 MG tablet Take 1 tablet (50 mg total) by mouth every 4 (four) hours as needed. (Patient not taking: Reported on 08/08/2023) 20 tablet 0   triamcinolone  cream (KENALOG ) 0.1 % Apply 1 Application topically 2 (two) times daily. (Patient not taking: Reported on 08/08/2023) 30 g 0     Physical Exam: Blood pressure 117/63, pulse 87, temperature 98.5 F (36.9 C), temperature source Oral, resp. rate 17, height 5' 5 (1.651 m), weight 91 kg, SpO2 95%. General:  Pleasant black female laying on hospital bed, appears stated age, NAD. HEENT: head -normocephalic, atraumatic; Eyes: PERRLA, no conjunctival injection, anicteric sclerae Neck- Trachea is midline, no thyromegaly or JVD appreciated.  CV- RRR, normal S1/S2, no M/R/G, radial and dorsalis pedis pulses 2+ BL, no lower extremity edema  Pulm- breathing is non-labored ORA Abd- soft, non-distended, incisions appear clean and well-healing without cellulitis, there is mild TTP in the LLQ/left suprapubic region without guarding or peritonitis GU- deferred  MSK- UE/LE symmetrical, no cyanosis, clubbing, or edema. Neuro- CN II-XII grossly in tact, no paresthesias. Psych- Alert and Oriented x3 with appropriate affect Skin: warm and dry, no rashes or lesions   Results for orders placed or performed during the hospital encounter of 08/08/23 (from the past 48 hours)  Lipase, blood  Status: None   Collection Time: 08/08/23  6:09 PM  Result Value Ref Range   Lipase 50 11 - 51 U/L    Comment: Performed at [facility], [address], [place], [place] [address]  Comprehensive metabolic panel     Status: Abnormal   Collection Time: 08/08/23  6:09 PM  Result Value Ref Range   Sodium 134 (L) 135 - 145 mmol/L   Potassium 4.3 3.5 - 5.1 mmol/L   Chloride 104 98 - 111 mmol/L   CO2 20 (L) 22 - 32 mmol/L   Glucose, Bld 102 (H) 70 - 99 mg/dL    Comment: Glucose reference range applies only to samples taken after fasting for at least 8 hours.   BUN 22 (H) 6 - 20 mg/dL   Creatinine, Ser 8.82 (H) 0.44 - 1.00 mg/dL   Calcium  9.0 8.9 - 10.3 mg/dL   Total Protein 8.0 6.5 - 8.1 g/dL   Albumin 3.5 3.5 - 5.0 g/dL   AST 17 15 - 41 U/L   ALT 14 0 - 44 U/L   Alkaline Phosphatase 83 38 - 126 U/L   Total Bilirubin 0.6 0.0 - 1.2 mg/dL   GFR, Estimated 58 (L) >60 mL/min    Comment: (NOTE) Calculated using the CKD-EPI Creatinine Equation (2021)    Anion gap 10 5 - 15    Comment: Performed at [facility], [address], [place], [place] [address]  CBC     Status: Abnormal   Collection Time: 08/08/23  6:09 PM  Result Value Ref Range   WBC 5.6 4.0 - 10.5 K/uL   RBC 3.52 (L) 3.87 - 5.11 MIL/uL   Hemoglobin 8.3 (L) 12.0 - 15.0 g/dL   HCT 71.8 (L) 63.9 - 53.9 %   MCV 79.8 (L) 80.0 - 100.0 fL   MCH 23.6 (L) 26.0 - 34.0 pg   MCHC 29.5 (L) 30.0 - 36.0 g/dL   RDW 79.6 (H) 88.4 - 84.4 %   Platelets 371 150 - 400 K/uL   nRBC 0.0 0.0 - 0.2 %    Comment: Performed at [facility], [address] [place], [place] [address]  hCG, serum, qualitative     Status: None   Collection Time: 08/08/23  6:09 PM  Result Value Ref Range   Preg, Serum NEGATIVE NEGATIVE    Comment:        THE SENSITIVITY OF THIS METHODOLOGY IS >10 mIU/mL. Performed at [facility], [address] [place], [place] [address]   Glucose, capillary     Status: Abnormal   Collection Time: 08/08/23  9:01 PM  Result Value Ref Range   Glucose-Capillary 106 (H) 70 - 99 mg/dL    Comment: Glucose reference range applies only to samples taken after fasting for at least 8 hours.  Glucose, capillary     Status: Abnormal   Collection Time: 08/09/23  7:51 AM  Result Value Ref Range   Glucose-Capillary 106 (H) 70 - 99 mg/dL    Comment: Glucose reference range applies only to samples taken after fasting for at least 8 hours.  Glucose, capillary     Status: None   Collection Time: 08/09/23 12:21 PM  Result Value Ref Range   Glucose-Capillary 96 70 - 99 mg/dL    Comment: Glucose reference range applies only to samples taken after fasting for at least 8 hours.   No results found.    Assessment/Plan 48 y/o F with sigmoid  diverticulitis with small mesenteric abscess, this is her first known episode of diverticulitis.  - afebrile, hemodynamically stable, WBC yesterday 5.6 (from 13.8  2 weeks ago) - CT w/o contrast performed yesterday visible in PACS. Abscess is mesenteric,  medial, and small. Not amenable to IR drainage.  - exam with mild, focal LLQ tenderness and no peritonitis. Based on history and exam patient had trouble differentiating post-op pain from diverticulitis pain. I suspect she has had diverticulitis for a couple of weeks now.  - no emergent surgical needs. If she continues to improve with non-operative measures would recommend outpatient follow up with GI in about 6 weeks for colonoscopy.   FEN - CLD VTE - SCD's, ok for DVT ppx with LMWH from [facility] standpoint ID - Zosyn  Admit - TRH service   RCC s/p L partial nephrectomy 06/2023 Dr. Devere HTN Obesity  IDA   I reviewed nursing notes, ED provider notes, hospitalist notes, last 24 h vitals and pain scores, last 48 h intake and output, last 24 h labs and trends, and last 24 h imaging results.  Almarie GORMAN Pringle, PA-C [facility] 08/09/2023, 12:27 PM Please see Amion for pager number during day hours 7:00am-4:30pm or 7:00am -11:30am on weekends

## 2023-08-09 NOTE — Plan of Care (Signed)
  Problem: Health Behavior/Discharge Planning: Goal: Ability to manage health-related needs will improve Outcome: Adequate for Discharge   Problem: Metabolic: Goal: Ability to maintain appropriate glucose levels will improve Outcome: Progressing   Problem: Nutritional: Goal: Maintenance of adequate nutrition will improve Outcome: Adequate for Discharge Goal: Progress toward achieving an optimal weight will improve Outcome: Progressing   Problem: Skin Integrity: Goal: Risk for impaired skin integrity will decrease Outcome: Adequate for Discharge   Problem: Tissue Perfusion: Goal: Adequacy of tissue perfusion will improve Outcome: Adequate for Discharge   Problem: Education: Goal: Knowledge of General Education information will improve Description: Including pain rating scale, medication(s)/side effects and non-pharmacologic comfort measures Outcome: Progressing   Problem: Health Behavior/Discharge Planning: Goal: Ability to manage health-related needs will improve Outcome: Adequate for Discharge   Problem: Clinical Measurements: Goal: Ability to maintain clinical measurements within normal limits will improve Outcome: Progressing Goal: Will remain free from infection Outcome: Progressing Goal: Diagnostic test results will improve Outcome: Progressing Goal: Respiratory complications will improve Outcome: Progressing Goal: Cardiovascular complication will be avoided Outcome: Progressing   Problem: Activity: Goal: Risk for activity intolerance will decrease Outcome: Adequate for Discharge   Problem: Nutrition: Goal: Adequate nutrition will be maintained Outcome: Progressing   Problem: Coping: Goal: Level of anxiety will decrease Outcome: Progressing   Problem: Elimination: Goal: Will not experience complications related to bowel motility Outcome: Progressing Goal: Will not experience complications related to urinary retention Outcome: Completed/Met   Problem:  Pain Managment: Goal: General experience of comfort will improve and/or be controlled Outcome: Progressing   Problem: Safety: Goal: Ability to remain free from injury will improve Outcome: Adequate for Discharge   Problem: Skin Integrity: Goal: Risk for impaired skin integrity will decrease Outcome: Adequate for Discharge

## 2023-08-09 NOTE — TOC Initial Note (Signed)
 Transition of Care [facility]) - Initial/Assessment Note    Patient Details  Name: Kaylee Maxwell MRN: [id number] Date of Birth: 01/26/1976  Transition of Care [facility] CM/SW Contact:    Alfonse JONELLE Rex, RN Phone Number: 08/09/2023, 10:54 AM  Clinical Narrative:  Met with patient at bedside to introduce role of TOC/NCM and review for dc planning, pt confirmed she has an established PCP, no current home  care services or home DME, pt reports she feels safe returning home with support from her children. TOC will continue to follow.                  Expected Discharge Plan: Home/Self Care Barriers to Discharge: Continued Medical Work up   Patient Goals and CMS Choice Patient states their goals for this hospitalization and ongoing recovery are:: return home          Expected Discharge Plan and Services       Living arrangements for the past 2 months: Single Family Home                                      Prior Living Arrangements/Services Living arrangements for the past 2 months: Single Family Home Lives with:: Adult Children Patient language and need for interpreter reviewed:: Yes Do you feel safe going back to the place where you live?: Yes      Need for Family Participation in Patient Care: Yes (Comment) Care giver support system in place?: Yes (comment)   Criminal Activity/Legal Involvement Pertinent to Current Situation/Hospitalization: No - Comment as needed  Activities of Daily Living   ADL Screening (condition at time of admission) Independently performs ADLs?: Yes (appropriate for developmental age) Is the patient deaf or have difficulty hearing?: No Does the patient have difficulty seeing, even when wearing glasses/contacts?: No Does the patient have difficulty concentrating, remembering, or making decisions?: No  Permission Sought/Granted                  Emotional Assessment Appearance:: Appears stated age Attitude/Demeanor/Rapport:  Engaged Affect (typically observed): Accepting Orientation: : Oriented to Self, Oriented to Place, Oriented to  Time, Oriented to Situation Alcohol / Substance Use: Not Applicable Psych Involvement: No (comment)  Admission diagnosis:  Diverticulitis [K57.92] Abscess of sigmoid colon due to diverticulitis [K57.20] Patient Active Problem List   Diagnosis Date Noted   Abscess of sigmoid colon due to diverticulitis 08/08/2023   Generalized abdominal pain 07/21/2023   Dermatitis 07/21/2023   Electrolyte abnormality 07/21/2023   Gastritis and gastroduodenitis 02/02/2023   RUQ pain 12/06/2022   Renal mass 12/06/2022   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   Dilated cbd, acquired 12/05/2022   Gout 10/28/2022   Need for influenza vaccination 10/28/2022   Need for diphtheria-tetanus-pertussis (Tdap) vaccine 05/24/2022   Dyslipidemia, goal LDL below 70 05/24/2022   Annual physical exam 04/19/2022   Iron deficiency anemia 04/19/2022   Hallux valgus (acquired), left foot 05/14/2021   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   Diarrhea 04/20/2020   Diabetes mellitus (HCC) 04/20/2020   Vaginal itching 07/17/2018   Dysuria 07/17/2018   Primary insomnia 07/16/2018   Excessive bleeding in premenopausal period 05/31/2018   GAD (generalized anxiety disorder) 03/21/2018   Heart palpitations 03/04/2018   Irregular heartbeat 03/04/2018   Breast cancer screening 03/04/2018   LGSIL on Pap smear of cervix 04/11/2016   Class 2 obesity in adult 12/08/2015   Essential  hypertension 12/08/2015   Type 2 diabetes mellitus without complication, without long-term current use of insulin  (HCC) 12/08/2015   Palpitations 12/08/2015   Scalp mass 10/09/2012   PCP:  Paseda, [name], FNP Pharmacy:   [facility] [address] [place] [address] Phone: [phone] Fax: [phone]     Social Drivers of Health (SDOH) Social History: SDOH Screenings   Food  Insecurity: No Food Insecurity (08/08/2023)  Housing: Low Risk  (08/08/2023)  Transportation Needs: No Transportation Needs (08/08/2023)  Utilities: Not At Risk (08/08/2023)  Alcohol Screen: Low Risk  (01/04/2021)  Depression (PHQ2-9): Low Risk  (07/21/2023)  Financial Resource Strain: High Risk (03/21/2018)  Physical Activity: Inactive (03/21/2018)  Social Connections: Moderately Integrated (03/21/2018)  Stress: Stress Concern Present (03/21/2018)  Tobacco Use: Medium Risk (08/08/2023)   SDOH Interventions:     Readmission Risk Interventions    08/09/2023   10:53 AM  Readmission Risk Prevention Plan  Post Dischage Appt Complete  Medication Screening Complete  Transportation Screening Complete

## 2023-08-09 NOTE — Plan of Care (Signed)
   Problem: Education: Goal: Knowledge of General Education information will improve Description: Including pain rating scale, medication(s)/side effects and non-pharmacologic comfort measures Outcome: Progressing   Problem: Activity: Goal: Risk for activity intolerance will decrease Outcome: Progressing   Problem: Coping: Goal: Level of anxiety will decrease Outcome: Progressing

## 2023-08-09 NOTE — Hospital Course (Addendum)
 Kaylee Maxwell is a 48 y.o. female with PMH of DM-2, HTN, obesity, left renal mass s/p partial nephrectomy, IDA and palpitation sent to ED by urology due to abnormal CT.    Patient with left renal mass status post partial nephrectomy by Dr. Devere on 07/04/2023.  She has had  abdominal pain across lower abdomen since then.  She describes the pain as sore and achy.  No progression in pain.  She rates her pain 8-9 on a scale of 10 at its worst.  She has been managing pain with ibuprofen , Tylenol  and tramadol .  Sometimes, pain radiates to right upper quadrant.  She went to see her urologist today and had a CT renal stone study that showed distal sigmoid colon diverticulitis with small 2.4 x 2.3 cm abscess, and she was  advised to come to the ED.   Patient reports nausea but denies emesis.  She reports constipation but had a bowel movement this morning.   No prior history of diverticulitis.  Never had colonoscopy.      ED:  stable vitals.  No leukocytosis.  Hgb 8.3 (about baseline.  MCV 79.8.  Started on IV Zosyn .  Per EDP, Dr. Rubin with general surgery consulted and will evaluate patient in the morning.  They are recommended admission to medical team.    Assessment & Plan:   Principal Problem:   Abscess of sigmoid colon due to diverticulitis   Assessment and Plan:  Abscess of sigmoid colon due to diverticulitis:  - Hemodynamically stable, febrile with no leukocytosis, on IV antibiotics - EDP and general surgery has reviewed outside to CT renal stone study at urology office distal sigmoid colon diverticulitis with small 2.4 x 2.3 cm abscess. - Continue IV Zosyn   -Surgery recommending-  close monitoring abscess and mesenteric medial is small not amendable for IR drainage, no surgery recommended at this point Recommend colonoscopy in 6 weeks - Clear liquid diet    NIDDM II -  - A1c 7.1% in 05/2023.  On metformin  at home. -CBG monitoring and SSI-sensitive   Essential hypertension:   Remain normotensive - Resuming home meds   Renal mass/RCC s/p left partial nephrectomy by Dr. Devere on 5/27.  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 - Outpatient follow-up with urology -Stable BUN/creatinine -   Iron deficiency anemia: Denies overt bleeding such as melena, hematochezia or hematuria.  Currently stable.    Latest Ref Rng & Units 08/08/2023    6:09 PM 07/21/2023    2:57 PM 07/05/2023    9:33 AM  CBC  WBC 4.0 - 10.5 K/uL 5.6  13.8    Hemoglobin 12.0 - 15.0 g/dL 8.3  8.9  8.8   Hematocrit 36.0 - 46.0 % 28.1  29.7  29.3   Platelets 150 - 400 K/uL 371  918      - Continue monitoring - Continue home ferrous sulfate    Hyperlipidemia - Resume home Lipitor after med rec   History of palpitation:  Stable, on Toprol -XL    History of gastritis/duodenitis - Continue home Protonix  after med rec   Class I obesity Body mass index is 33.38 kg/m.

## 2023-08-09 NOTE — Progress Notes (Signed)
 PROGRESS NOTE    Patient: Kaylee Maxwell                            PCP: Paseda, [name], FNP                    DOB: 01-18-1976            DOA: 08/08/2023 FMW:[id number]             DOS: 08/09/2023, 2:24 PM   LOS: 1 day   Date of Service: The patient was seen and examined on 08/09/2023  Subjective:   The patient was seen and examined this morning. Hemodynamically stable. No issues overnight .  Brief Narrative:   Kaylee Maxwell is a 48 y.o. female with PMH of DM-2, HTN, obesity, left renal mass s/p partial nephrectomy, IDA and palpitation sent to ED by urology due to abnormal CT.    Patient with left renal mass status post partial nephrectomy by Dr. Devere on 07/04/2023.  She has had  abdominal pain across lower abdomen since then.  She describes the pain as sore and achy.  No progression in pain.  She rates her pain 8-9 on a scale of 10 at its worst.  She has been managing pain with ibuprofen , Tylenol  and tramadol .  Sometimes, pain radiates to right upper quadrant.  She went to see her urologist today and had a CT renal stone study that showed distal sigmoid colon diverticulitis with small 2.4 x 2.3 cm abscess, and she was  advised to come to the ED.   Patient reports nausea but denies emesis.  She reports constipation but had a bowel movement this morning.   No prior history of diverticulitis.  Never had colonoscopy.      ED:  stable vitals.  No leukocytosis.  Hgb 8.3 (about baseline.  MCV 79.8.  Started on IV Zosyn .  Per EDP, Dr. Rubin with general surgery consulted and will evaluate patient in the morning.  They are recommended admission to medical team.    Assessment & Plan:   Principal Problem:   Abscess of sigmoid colon due to diverticulitis   Assessment and Plan:  Abscess of sigmoid colon due to diverticulitis:  - Hemodynamically stable, febrile with no leukocytosis, on IV antibiotics - EDP and general surgery has reviewed outside to CT renal stone study at  urology office distal sigmoid colon diverticulitis with small 2.4 x 2.3 cm abscess. - Continue IV Zosyn   -Surgery recommending-  close monitoring abscess and mesenteric medial is small not amendable for IR drainage, no surgery recommended at this point Recommend colonoscopy in 6 weeks - Clear liquid diet    NIDDM II -  - A1c 7.1% in 05/2023.  On metformin  at home. -CBG monitoring and SSI-sensitive   Essential hypertension:  Remain normotensive - Resuming home meds   Renal mass/RCC s/p left partial nephrectomy by Dr. Devere on 5/27.  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 - Outpatient follow-up with urology -Stable BUN/creatinine -   Iron deficiency anemia: Denies overt bleeding such as melena, hematochezia or hematuria.  Currently stable.    Latest Ref Rng & Units 08/08/2023    6:09 PM 07/21/2023    2:57 PM 07/05/2023    9:33 AM  CBC  WBC 4.0 - 10.5 K/uL 5.6  13.8    Hemoglobin 12.0 - 15.0 g/dL 8.3  8.9  8.8   Hematocrit 36.0 - 46.0 % 28.1  29.7  29.3   Platelets 150 - 400 K/uL 371  918      - Continue monitoring - Continue home ferrous sulfate    Hyperlipidemia - Resume home Lipitor after med rec   History of palpitation:  Stable, on Toprol -XL    History of gastritis/duodenitis - Continue home Protonix  after med rec   Class I obesity Body mass index is 33.38 kg/m.      ----------------------------------------------------------------------------------------------------------------------------------------------- Nutritional status:  The patient's BMI is: Body mass index is 33.38 kg/m. I agree with the assessment and plan as outlined ------------------------------------------------------------------------------------------------------------------------------------------------  DVT prophylaxis:  SCDs Start: 08/08/23 1956   Code Status:   Code Status: Full Code  Family Communication: No family member present at bedside-  -Advance care planning has been discussed.    Admission status:   Status is: Inpatient Remains inpatient appropriate because: Surgical evaluation IV antibiotics   Disposition: From  - home             Planning for discharge in 2 days: to   Procedures:   No admission procedures for hospital encounter.   Antimicrobials:  Anti-infectives (From admission, onward)    Start     Dose/Rate Route Frequency Ordered Stop   08/09/23 0130  piperacillin -tazobactam (ZOSYN ) IVPB 3.375 g        3.375 g 12.5 mL/hr over 240 Minutes Intravenous Every 8 hours 08/08/23 2014     08/08/23 1845  piperacillin -tazobactam (ZOSYN ) IVPB 3.375 g        3.375 g 100 mL/hr over 30 Minutes Intravenous  Once 08/08/23 1843 08/08/23 2026        Medication:   atorvastatin   10 mg Oral Daily   feeding supplement  1 Container Oral TID BM   insulin  aspart  0-5 Units Subcutaneous QHS   insulin  aspart  0-9 Units Subcutaneous TID WC   lactobacillus  1 g Oral TID WC   metoprolol  succinate  50 mg Oral Daily    acetaminophen  **OR** acetaminophen , HYDROmorphone  (DILAUDID ) injection, ondansetron  **OR** ondansetron  (ZOFRAN ) IV, oxyCODONE , traZODone    Objective:   Vitals:   08/09/23 0137 08/09/23 0525 08/09/23 0851 08/09/23 1315  BP: 118/70 117/63 117/63 117/71  Pulse: 81 87 87 70  Resp: 17 17  18   Temp: 98.1 F (36.7 C) 98.5 F (36.9 C)  97.9 F (36.6 C)  TempSrc: Oral Oral  Oral  SpO2: 100% 95%  96%  Weight:      Height:        Intake/Output Summary (Last 24 hours) at 08/09/2023 1424 Last data filed at 08/09/2023 0756 Gross per 24 hour  Intake 1191.32 ml  Output --  Net 1191.32 ml   Filed Weights   08/08/23 1754 08/08/23 2109  Weight: 91 kg 91 kg     Physical examination:   Constitution:  Alert, cooperative, no distress,  Appears calm and comfortable  Psychiatric:   Normal and stable mood and affect, cognition intact,   HEENT:        Normocephalic, PERRL, otherwise with in Normal limits  Chest:         Chest symmetric Cardio vascular:   S1/S2, RRR, No murmure, No Rubs or Gallops  pulmonary: Clear to auscultation bilaterally, respirations unlabored, negative wheezes / crackles Abdomen: Soft, mild right upper quadrant tenderness, negative for any rebound tenderness,, non-distended, bowel sounds,no masses, no organomegaly Muscular skeletal: Limited exam - in bed, able to move all 4 extremities,   Neuro: CNII-XII intact. , normal motor and sensation, reflexes intact  Extremities:  No pitting edema lower extremities, +2 pulses  Skin: Dry, warm to touch, negative for any Rashes, No open wounds Wounds: per nursing documentation   ------------------------------------------------------------------------------------------------------------------------------------------    LABs:     Latest Ref Rng & Units 08/08/2023    6:09 PM 07/21/2023    2:57 PM 07/05/2023    9:33 AM  CBC  WBC 4.0 - 10.5 K/uL 5.6  13.8    Hemoglobin 12.0 - 15.0 g/dL 8.3  8.9  8.8   Hematocrit 36.0 - 46.0 % 28.1  29.7  29.3   Platelets 150 - 400 K/uL 371  918        Latest Ref Rng & Units 08/08/2023    6:09 PM 07/21/2023    2:57 PM 07/05/2023    5:19 AM  CMP  Glucose 70 - 99 mg/dL 897  879  853   BUN 6 - 20 mg/dL 22  12  10    Creatinine 0.44 - 1.00 mg/dL 8.82  8.85  8.79   Sodium 135 - 145 mmol/L 134  132  133   Potassium 3.5 - 5.1 mmol/L 4.3  4.6  3.6   Chloride 98 - 111 mmol/L 104  94  103   CO2 22 - 32 mmol/L 20  21  20    Calcium  8.9 - 10.3 mg/dL 9.0  89.9  8.3   Total Protein 6.5 - 8.1 g/dL 8.0     Total Bilirubin 0.0 - 1.2 mg/dL 0.6     Alkaline Phos 38 - 126 U/L 83     AST 15 - 41 U/L 17     ALT 0 - 44 U/L 14          Micro Results No results found for this or any previous visit (from the past 240 hours).  Radiology Reports No results found.  SIGNED: Adriana DELENA Grams, MD, FHM. FAAFP. Jolynn Pack - [facility] Time spent - 55 min.  In seeing, evaluating and examining the patient. Reviewing medical records, labs, drawn plan of  care. [facility],  Pager (please use [email or web address] to page/ text) Please use Epic Secure Chat for non-urgent communication (7AM-7PM)  If 7PM-7AM, please contact night-coverage [email or web address], 08/09/2023, 2:24 PM

## 2023-08-10 ENCOUNTER — Other Ambulatory Visit (HOSPITAL_COMMUNITY): Payer: Self-pay

## 2023-08-10 ENCOUNTER — Other Ambulatory Visit: Payer: Self-pay

## 2023-08-10 DIAGNOSIS — K5720 Diverticulitis of large intestine with perforation and abscess without bleeding: Secondary | ICD-10-CM | POA: Diagnosis not present

## 2023-08-10 LAB — BASIC METABOLIC PANEL WITH GFR
Anion gap: 8 (ref 5–15)
BUN: 8 mg/dL (ref 6–20)
CO2: 19 mmol/L — ABNORMAL LOW (ref 22–32)
Calcium: 8.9 mg/dL (ref 8.9–10.3)
Chloride: 109 mmol/L (ref 98–111)
Creatinine, Ser: 0.99 mg/dL (ref 0.44–1.00)
GFR, Estimated: >60 mL/min (ref >60)
Glucose, Bld: 100 mg/dL — ABNORMAL HIGH (ref 70–99)
Potassium: 3.8 mmol/L (ref 3.5–5.1)
Sodium: 136 mmol/L (ref 135–145)

## 2023-08-10 LAB — CBC
HCT: 27.1 % — ABNORMAL LOW (ref 36.0–46.0)
Hemoglobin: 8.2 g/dL — ABNORMAL LOW (ref 12.0–15.0)
MCH: 23.9 pg — ABNORMAL LOW (ref 26.0–34.0)
MCHC: 30.3 g/dL (ref 30.0–36.0)
MCV: 79 fL — ABNORMAL LOW (ref 80.0–100.0)
Platelets: 372 10*3/uL (ref 150–400)
RBC: 3.43 MIL/uL — ABNORMAL LOW (ref 3.87–5.11)
RDW: 20 % — ABNORMAL HIGH (ref 11.5–15.5)
WBC: 4.5 10*3/uL (ref 4.0–10.5)
nRBC: 0 % (ref 0.0–0.2)

## 2023-08-10 LAB — GLUCOSE, CAPILLARY
Glucose-Capillary: 115 mg/dL — ABNORMAL HIGH (ref 70–99)
Glucose-Capillary: 171 mg/dL — ABNORMAL HIGH (ref 70–99)

## 2023-08-10 MED ORDER — LACTINEX PO CHEW
1.0000 | CHEWABLE_TABLET | Freq: Three times a day (TID) | ORAL | 0 refills | Status: AC
Start: 2023-08-10 — End: 2023-08-20
  Filled 2023-08-10: qty 30, 10d supply, fill #0

## 2023-08-10 MED ORDER — CIPROFLOXACIN HCL 500 MG PO TABS
500.0000 mg | ORAL_TABLET | Freq: Two times a day (BID) | ORAL | 0 refills | Status: AC
  Filled 2023-08-10 (×2): qty 14, 7d supply, fill #0

## 2023-08-10 MED ORDER — DOCUSATE SODIUM 100 MG PO CAPS: 100.0000 mg | ORAL_CAPSULE | Freq: Two times a day (BID) | ORAL | Status: DC

## 2023-08-10 MED ORDER — SENNOSIDES-DOCUSATE SODIUM 8.6-50 MG PO TABS
1.0000 | ORAL_TABLET | Freq: Two times a day (BID) | ORAL | Status: DC
  Administered 2023-08-10: 1 via ORAL
  Filled 2023-08-10: qty 1

## 2023-08-10 MED ORDER — ONDANSETRON 4 MG PO TBDP
4.0000 mg | ORAL_TABLET | ORAL | 0 refills | Status: AC | PRN
  Filled 2023-08-10 (×2): qty 20, 4d supply, fill #0

## 2023-08-10 MED ORDER — POLYETHYLENE GLYCOL 3350 17 G PO PACK
17.0000 g | PACK | Freq: Every day | ORAL | Status: DC
  Administered 2023-08-10: 17 g via ORAL
  Filled 2023-08-10: qty 1

## 2023-08-10 MED ORDER — METRONIDAZOLE 500 MG PO TABS
500.0000 mg | ORAL_TABLET | Freq: Three times a day (TID) | ORAL | 0 refills | Status: AC
  Filled 2023-08-10 (×2): qty 21, 7d supply, fill #0

## 2023-08-10 NOTE — Progress Notes (Signed)
 [facility] Progress Note     Subjective: CC:  Pain and nausea improving. Tolerating PO. +flatus. Last BM was 48 h ago.   Objective: Vital signs in last 24 hours: Temp:  [97.9 F (36.6 C)-99.1 F (37.3 C)] 99.1 F (37.3 C) (07/02 2135) Pulse Rate:  [70-77] 77 (07/02 2135) Resp:  [18] 18 (07/02 2135) BP: (117-127)/(71) 127/71 (07/02 2135) SpO2:  [96 %-100 %] 100 % (07/02 2135) Last BM Date : 08/08/23  Intake/Output from previous day: 07/02 0701 - 07/03 0700 In: 295 [I.V.:145; IV Piggyback:150] Out: -  Intake/Output this shift: No intake/output data recorded.  PE: Gen:  Alert, NAD, pleasant Card:  Regular rate and rhythm Pulm:  Normal effort ORA Abd: Soft, overall non-tender, incisions c/d/I, no guarding, no hernias Skin: warm and dry, no rashes  Psych: A&Ox3   Lab Results:  Recent Labs    08/08/23 1809 08/10/23 0349  WBC 5.6 4.5  HGB 8.3* 8.2*  HCT 28.1* 27.1*  PLT 371 372   BMET Recent Labs    08/08/23 1809 08/10/23 0349  NA 134* 136  K 4.3 3.8  CL 104 109  CO2 20* 19*  GLUCOSE 102* 100*  BUN 22* 8  CREATININE 1.17* 0.99  CALCIUM  9.0 8.9   PT/INR No results for input(s): LABPROT, INR in the last 72 hours. CMP     Component Value Date/Time   NA 136 08/10/2023 0349   NA 132 (L) 07/21/2023 1457   K 3.8 08/10/2023 0349   CL 109 08/10/2023 0349   CO2 19 (L) 08/10/2023 0349   GLUCOSE 100 (H) 08/10/2023 0349   BUN 8 08/10/2023 0349   BUN 12 07/21/2023 1457   CREATININE 0.99 08/10/2023 0349   CREATININE 1.18 (H) 06/03/2021 0903   CALCIUM  8.9 08/10/2023 0349   PROT 8.0 08/08/2023 1809   PROT 7.8 06/09/2023 1330   ALBUMIN 3.5 08/08/2023 1809   ALBUMIN 4.2 06/09/2023 1330   AST 17 08/08/2023 1809   ALT 14 08/08/2023 1809   ALKPHOS 83 08/08/2023 1809   BILITOT 0.6 08/08/2023 1809   BILITOT 0.3 06/09/2023 1330   GFRNONAA >60 08/10/2023 0349   GFRAA 101 09/05/2019 1120   Lipase     Component Value Date/Time   LIPASE 50  08/08/2023 1809       Studies/Results: No results found.  Anti-infectives: Anti-infectives (From admission, onward)    Start     Dose/Rate Route Frequency Ordered Stop   08/09/23 0130  piperacillin -tazobactam (ZOSYN ) IVPB 3.375 g        3.375 g 12.5 mL/hr over 240 Minutes Intravenous Every 8 hours 08/08/23 2014     08/08/23 1845  piperacillin -tazobactam (ZOSYN ) IVPB 3.375 g        3.375 g 100 mL/hr over 30 Minutes Intravenous  Once 08/08/23 1843 08/08/23 2026        Assessment/Plan  48 y/o F with sigmoid diverticulitis with small mesenteric abscess, this is her first known episode of diverticulitis.  - afebrile, hemodynamically stable, WBC WNL (from 13.8  2 weeks ago) - CT w/o contrast performed yesterday visible in PACS. Abscess is mesenteric, medial, and small. Not amenable to IR drainage.  - exam with mild, focal LLQ tenderness and no peritonitis. Based on history and exam patient had trouble differentiating post-op pain from diverticulitis pain. I suspect she has had diverticulitis for a couple of weeks now.  - no emergent surgical needs. Advance to soft diet. If tolerates soft diet and has a BM then  I think discharge home on PO abx is reasonable. She needs outpatient follow up with GI in about 6 weeks for colonoscopy, I sent a referral for this.   FEN - soft VTE - SCD's, ok for DVT ppx with LMWH from [facility] standpoint ID - Zosyn  Admit - [facility] service    RCC s/p L partial nephrectomy 06/2023 Dr. Devere HTN Obesity  IDA      LOS: 2 days   I reviewed nursing notes, hospitalist notes, last 24 h vitals and pain scores, last 48 h intake and output, last 24 h labs and trends, and last 24 h imaging results.  This care required moderate level of medical decision making.   Kaylee Pringle, PA-C [facility] Please see Amion for pager number during day hours 7:00am-4:30pm

## 2023-08-10 NOTE — Plan of Care (Signed)
 Patient to discharge home via private vehicle following delivery of medications from [facility]. AVS and discharge instruction provided and patient verbalizes understanding. Jon LULLA Reins, RN 08/10/23 2:56 PM

## 2023-08-10 NOTE — Plan of Care (Signed)
 Problem: Health Behavior/Discharge Planning: Goal: Ability to identify and utilize available resources and services will improve Outcome: Progressing   Problem: Clinical Measurements: Goal: Ability to maintain clinical measurements within normal limits will improve Outcome: Progressing   Problem: Coping: Goal: Level of anxiety will decrease Outcome: Progressing   Problem: Pain Managment: Goal: General experience of comfort will improve and/or be controlled Outcome: Progressing   Problem: Safety: Goal: Ability to remain free from injury will improve Outcome: Progressing   Jon LULLA Reins, RN 08/10/23 12:09 PM

## 2023-08-10 NOTE — Discharge Summary (Signed)
 Physician Discharge Summary   Patient: Kaylee Maxwell MRN: [id number] DOB: 07-07-1975  Admit date:     08/08/2023  Discharge date: 08/10/23  Discharge Physician: Adriana DELENA Grams   PCP: Paseda, [name], FNP   Recommendations at discharge:    Follow your PCP in 1 week Follow-up with gastroenterologist in 6 weeks for evaluation and possible colonoscopy Advance your diet very slowly Maintain oral hydration Continue currently recommended antibiotic course (ciprofloxacin/Flagyl )  Discharge Diagnoses: Principal Problem:   Abscess of sigmoid colon due to diverticulitis  Resolved Problems:   * No resolved hospital problems. *  Hospital Course: TIMMIA COGBURN is a 48 y.o. female with PMH of DM-2, HTN, obesity, left renal mass s/p partial nephrectomy, IDA and palpitation sent to ED by urology due to abnormal CT.    Patient with left renal mass status post partial nephrectomy by Dr. Devere on 07/04/2023.  She has had  abdominal pain across lower abdomen since then.  She describes the pain as sore and achy.  No progression in pain.  She rates her pain 8-9 on a scale of 10 at its worst.  She has been managing pain with ibuprofen , Tylenol  and tramadol .  Sometimes, pain radiates to right upper quadrant.  She went to see her urologist today and had a CT renal stone study that showed distal sigmoid colon diverticulitis with small 2.4 x 2.3 cm abscess, and she was  advised to come to the ED.   Patient reports nausea but denies emesis.  She reports constipation but had a bowel movement this morning.   No prior history of diverticulitis.  Never had colonoscopy.      ED:  stable vitals.  No leukocytosis.  Hgb 8.3 (about baseline.  MCV 79.8.  Started on IV Zosyn .  Per EDP, Dr. Rubin with general surgery consulted and will evaluate patient in the morning.  They are recommended admission to medical team.    Abscess of sigmoid colon due to diverticulitis:  - Hemodynamically stable, febrile  with no leukocytosis, on IV antibiotics - EDP and general surgery has reviewed outside to CT renal stone study at urology office distal sigmoid colon diverticulitis with small 2.4 x 2.3 cm abscess. - Was on  IV Zosyn  --- switch to p.o. ciprofloxacin/Flagyl  on 08/10/2023 for 7 more days  -Surgery recommending-  close monitoring abscess and mesenteric medial is small not amendable for IR drainage, no surgery recommended at this point Recommend follow-up with GI for colonoscopy in 6 weeks - Advance diet as tolerated   NIDDM II -  - A1c 7.1% in 05/2023.  Resume home regimen including metformin  Continue carb modified diabetic diet  Essential hypertension:  -Blood pressure continues to improve, currently stable,  continue to hold spironolactone  and Diovan  To resume metoprolol    Renal mass/RCC s/p left partial nephrectomy by Dr. Devere on 5/27.  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 - Outpatient follow-up with urology -Stable BUN/creatinine -   Iron deficiency anemia: Denies overt bleeding such as melena, hematochezia or hematuria.  Currently stable.    Latest Ref Rng & Units 08/08/2023    6:09 PM 07/21/2023    2:57 PM 07/05/2023    9:33 AM  CBC  WBC 4.0 - 10.5 K/uL 5.6  13.8    Hemoglobin 12.0 - 15.0 g/dL 8.3  8.9  8.8   Hematocrit 36.0 - 46.0 % 28.1  29.7  29.3   Platelets 150 - 400 K/uL 371  918     - Patient to  follow-up with gastroenterologist in 6 weeks for evaluation, possible colonoscopy  - Continue home ferrous sulfate    Hyperlipidemia - Resume home Lipitor after med rec   History of palpitation:  Stable, on Toprol -XL    History of gastritis/duodenitis - Continue home Protonix  after med rec   Class I obesity Body mass index is 33.38 kg/m.      Consultants: General Surgery Procedures performed: CT abdomen Disposition: Home Diet recommendation:  Discharge Diet Orders (From admission, onward)     Start     Ordered   08/10/23 0000  Diet - low sodium heart healthy         08/10/23 1327           Clear liquid diet, advance as tolerated DISCHARGE MEDICATION: Allergies as of 08/10/2023       Reactions   Ozempic  (0.25 Or 0.5 Mg-dose) [semaglutide (0.25 Or 0.5mg -dos)] Other (See Comments)   Abdominal pain        Medication List     PAUSE taking these medications    spironolactone  25 MG tablet Wait to take this until: August 15, 2023 Commonly known as: ALDACTONE  Take 1 tablet (25 mg total) by mouth daily.   valsartan  80 MG tablet Wait to take this until: August 17, 2023 Commonly known as: Diovan  Take 1 tablet (80 mg total) by mouth daily.       STOP taking these medications    pantoprazole  40 MG tablet Commonly known as: PROTONIX    traMADol  50 MG tablet Commonly known as: ULTRAM        TAKE these medications    aspirin  EC 81 MG tablet Take 81 mg by mouth in the morning. Swallow whole.   atorvastatin  10 MG tablet Commonly known as: LIPITOR Take 1 tablet (10 mg total) by mouth daily.   ciprofloxacin 500 MG tablet Commonly known as: Cipro Take 1 tablet (500 mg total) by mouth 2 (two) times daily for 7 days. Start taking on: August 11, 2023   docusate sodium  100 MG capsule Commonly known as: COLACE Take 1 capsule (100 mg total) by mouth 2 (two) times daily.   ferrous sulfate  325 (65 FE) MG EC tablet Take 1 tablet (325 mg total) by mouth daily. What changed: when to take this   freestyle lancets Use as directed   FREESTYLE LITE test strip Generic drug: glucose blood Use as instructed   FreeStyle Lite w/Device Kit Use as directed   HYDROcodone -acetaminophen  5-325 MG tablet Commonly known as: NORCO/VICODIN Take 1-2 tablets by mouth every 6 (six) hours as needed for moderate pain (pain score 4-6) or severe pain (pain score 7-10).   lactobacillus acidophilus & bulgar chewable tablet Chew 1 tablet by mouth 3 (three) times daily with meals for 10 days.   metFORMIN  500 MG 24 hr tablet Commonly known as:  GLUCOPHAGE -XR Take 500 mg with breakfast daily by mouth  for one week , then increase to 500 mg by mouth twice daily with meal What changed:  how much to take how to take this when to take this additional instructions   metoprolol  succinate 50 MG 24 hr tablet Commonly known as: TOPROL -XL Take 1 tablet (50 mg total) by mouth daily. Take with or immediately following a meal. Please schedule follow up appt for more refills   metroNIDAZOLE  500 MG tablet Commonly known as: FLAGYL  Take 1 tablet (500 mg total) by mouth 3 (three) times daily for 7 days. Start taking on: August 11, 2023   ondansetron  4 MG disintegrating tablet  Commonly known as: ZOFRAN -ODT Take 1 tablet (4 mg total) by mouth every 4 (four) hours as needed for up to 10 days.   traZODone  50 MG tablet Commonly known as: DESYREL  Take 1 tablet (50 mg total) by mouth at bedtime as needed for sleep   triamcinolone  cream 0.1 % Commonly known as: KENALOG  Apply 1 Application topically 2 (two) times daily.   Tylenol  8 Hour Arthritis Pain 650 MG CR tablet Generic drug: acetaminophen  Take 650-1,300 mg by mouth every 8 (eight) hours as needed for pain.        Discharge Exam: Filed Weights   08/08/23 1754 08/08/23 2109  Weight: 91 kg 91 kg        General:  AAO x 3,  cooperative, no distress;   HEENT:  Normocephalic, PERRL, otherwise with in Normal limits   Neuro:  CNII-XII intact. , normal motor and sensation, reflexes intact   Lungs:   Clear to auscultation BL, Respirations unlabored,  No wheezes / crackles  Cardio:    S1/S2, RRR, No murmure, No Rubs or Gallops   Abdomen:  Soft, non-tender, bowel sounds active all four quadrants, no guarding or peritoneal signs.  Muscular  skeletal:  Limited exam -global generalized weaknesses - in bed, able to move all 4 extremities,   2+ pulses,  symmetric, No pitting edema  Skin:  Dry, warm to touch, negative for any Rashes,  Wounds: Please see nursing documentation           Condition at discharge: fair  The results of significant diagnostics from this hospitalization (including imaging, microbiology, ancillary and laboratory) are listed below for reference.   Imaging Studies: No results found.  Microbiology: Results for orders placed or performed in visit on 04/20/20  Chlamydia/Gonococcus/Trichomonas, NAA     Status: None   Collection Time: 04/20/20 10:27 AM   Specimen: Urine   UR  Result Value Ref Range Status   Chlamydia by NAA Negative Negative Final   Gonococcus by NAA Negative Negative Final   Trich vag by NAA Negative Negative Final    Labs: CBC: Recent Labs  Lab 08/08/23 1809 08/10/23 0349  WBC 5.6 4.5  HGB 8.3* 8.2*  HCT 28.1* 27.1*  MCV 79.8* 79.0*  PLT 371 372   Basic Metabolic Panel: Recent Labs  Lab 08/08/23 1809 08/10/23 0349  NA 134* 136  K 4.3 3.8  CL 104 109  CO2 20* 19*  GLUCOSE 102* 100*  BUN 22* 8  CREATININE 1.17* 0.99  CALCIUM  9.0 8.9   Liver Function Tests: Recent Labs  Lab 08/08/23 1809  AST 17  ALT 14  ALKPHOS 83  BILITOT 0.6  PROT 8.0  ALBUMIN 3.5   CBG: Recent Labs  Lab 08/09/23 1221 08/09/23 1731 08/09/23 2137 08/10/23 0724 08/10/23 1113  GLUCAP 96 118* 103* 115* 171*    Discharge time spent: greater than 40 minutes.  Signed: Adriana DELENA Grams, MD [facility] 08/10/2023

## 2023-08-10 NOTE — Progress Notes (Signed)
 TOC meds in a secure bag delivered to pt in room by this RN.

## 2023-08-14 NOTE — Progress Notes (Unsigned)
 Cardiology Office Note    Patient Name: Kaylee Maxwell Date of Encounter: 08/14/2023  Primary Care Provider:  Paseda, [name], FNP Primary Cardiologist:  Aleene Passe, MD Primary Electrophysiologist: None   Past Medical History    Past Medical History:  Diagnosis Date   Anxiety 03/04/2018   Breast cancer screening 03/04/2018   Heart palpitations 03/04/2018   Monitor 9/21: normal sinus rhythm, sinus tachycardia, Avg HR 110; occ PVCs   Hypertension    Iron deficiency    Irregular heartbeat 03/04/2018   Mild mitral regurgitation    Obesity    Osteochondroma    PVC's (premature ventricular contractions)    Scalp mass 10/09/2012   Sinus tachycardia    Type 2 diabetes mellitus (HCC)     History of Present Illness  Kaylee Maxwell is a 48 y.o. female with a PMH of HTN, mild MR, PVCs, renal mass s/p partial nephrectomy, sinus tachycardia, CKD stage IIIa, anxiety, DM type II, HLD presents today for annual follow-up.  Kaylee Maxwell was seen initially with Dr. Passe in 2020 for evaluation of PVCs.  She wore a ZIO in 2021 that showed sinus rhythm with sinus tachycardia and was started on metoprolol .  She was seen on 06/01/2021 for preoperative clearance prior to bunionectomy procedure.  She completed a 2D echo in 06/03/2021 with EF of 55 to 60% and grade 1 DD with mild MVR.  She was last seen by Dr. Passe on 05/10/22 and was noted to have elevated BP with readings of 160/98 and 140/86.  She had lisinopril /HCTZ discontinued and was started on valsartan  80 mg and spironolactone  25 mg.  She was seen in the ED on 12/05/2022 with right upper quadrant pain.  She had a CT of the abdomen completed showing incidental left kidney mass and no evidence of gallstones.  She was evaluated by gastroenterology and underwent robotic assisted nephrectomy on 07/04/2023.  She was seen in the ED on 08/08/2023 with complaint of abdominal pain found to have sigmoid abscess.  He was placed on IV antibiotics in addition  of close monitoring colonoscopy in 6 weeks.  Patient denies chest pain, palpitations, dyspnea, PND, orthopnea, nausea, vomiting, dizziness, syncope, edema, weight gain, or early satiety.   Discussed the use of AI scribe software for clinical note transcription with the patient, who gave verbal consent to proceed.  History of Present Illness    ***Notes:   Review of Systems  Please see the history of present illness.    All other systems reviewed and are otherwise negative except as noted above.  Physical Exam    Wt Readings from Last 3 Encounters:  08/08/23 200 lb 9.9 oz (91 kg)  07/21/23 201 lb (91.2 kg)  07/04/23 217 lb 6 oz (98.6 kg)   CD:Uyzmz were no vitals filed for this visit.,There is no height or weight on file to calculate BMI. GEN: Well nourished, well developed in no acute distress Neck: No JVD; No carotid bruits Pulmonary: Clear to auscultation without rales, wheezing or rhonchi  Cardiovascular: Normal rate. Regular rhythm. Normal S1. Normal S2.   Murmurs: There is no murmur.  ABDOMEN: Soft, non-tender, non-distended EXTREMITIES:  No edema; No deformity   EKG/LABS/ Recent Cardiac Studies   ECG personally reviewed by me today - ***  Risk Assessment/Calculations:   {Does this patient have ATRIAL FIBRILLATION?:[id number]}      Lab Results  Component Value Date   WBC 4.5 08/10/2023   HGB 8.2 (L) 08/10/2023   HCT 27.1 (L)  08/10/2023   MCV 79.0 (L) 08/10/2023   PLT 372 08/10/2023   Lab Results  Component Value Date   CREATININE 0.99 08/10/2023   BUN 8 08/10/2023   NA 136 08/10/2023   K 3.8 08/10/2023   CL 109 08/10/2023   CO2 19 (L) 08/10/2023   Lab Results  Component Value Date   CHOL 127 07/21/2023   HDL 30 (L) 07/21/2023   LDLCALC 70 07/21/2023   LDLDIRECT 71 10/28/2022   TRIG 155 (H) 07/21/2023   CHOLHDL 4.2 07/21/2023    Lab Results  Component Value Date   HGBA1C 7.1 (A) 06/06/2023   Assessment & Plan    Assessment and  Plan Assessment & Plan     1.  Essential hypertension  2.  History of PVCs  3.  Hyperlipidemia  4.  Renal mass s/p left partial nephrectomy      Disposition: Follow-up with Aleene Passe, MD or APP in *** months {Are you ordering a CV Procedure (e.g. stress test, cath, DCCV, TEE, etc)?   Press F2        :[id number]}   Signed, Wyn Raddle, Jackee Shove, NP 08/14/2023, 10:49 AM [facility]

## 2023-08-15 ENCOUNTER — Ambulatory Visit: Attending: Nurse Practitioner | Admitting: Nurse Practitioner

## 2023-08-15 ENCOUNTER — Encounter: Payer: Self-pay | Admitting: Nurse Practitioner

## 2023-08-15 VITALS — BP 110/72 | HR 91 | Ht 65.0 in | Wt 201.2 lb

## 2023-08-15 DIAGNOSIS — I10 Essential (primary) hypertension: Secondary | ICD-10-CM | POA: Diagnosis not present

## 2023-08-15 DIAGNOSIS — N2889 Other specified disorders of kidney and ureter: Secondary | ICD-10-CM | POA: Diagnosis not present

## 2023-08-15 DIAGNOSIS — I493 Ventricular premature depolarization: Secondary | ICD-10-CM | POA: Diagnosis not present

## 2023-08-15 DIAGNOSIS — E785 Hyperlipidemia, unspecified: Secondary | ICD-10-CM | POA: Diagnosis not present

## 2023-08-15 NOTE — Patient Instructions (Addendum)
 Medication Instructions:  STOP DIOVAN  STOP SPIRONOLACTONE   *If you need a refill on your cardiac medications before your next appointment, please call your pharmacy*  Lab Work: NONE ORDERED If you have labs (blood work) drawn today and your tests are completely normal, you will receive your results only by: MyChart Message (if you have MyChart) OR A paper copy in the mail If you have any lab test that is abnormal or we need to change your treatment, we will call you to review the results.  Testing/Procedures: NONE ORDERED  Follow-Up: At [facility], you and your health needs are our priority.  As part of our continuing mission to provide you with exceptional heart care, our providers are all part of one team.  This team includes your primary Cardiologist (physician) and Advanced Practice Providers or APPs (Physician Assistants and Nurse Practitioners) who all work together to provide you with the care you need, when you need it.  Your next appointment:   6 month(s)  Provider:   Georganna Archer, MD    We recommend signing up for the patient portal called MyChart.  Sign up information is provided on this After Visit Summary.  MyChart is used to connect with patients for Virtual Visits (Telemedicine).  Patients are able to view lab/test results, encounter notes, upcoming appointments, etc.  Non-urgent messages can be sent to your provider as well.   To learn more about what you can do with MyChart, go to [email or web address].   Other Instructions MONITOR YOUR BLOOD PRESSURE. YOUR BLOOD PRESSURE GOAL IS 130/80   Check your blood pressure daily for 2 weeks, then contact the office with your readings.  Contact the office either by phone or MyChart with your readings.  Make sure to check your blood pressure 2 hours after taking your medications.   AVOID these things for 30 minutes before checking your blood pressure: No Drinking caffeine. No Drinking alcohol. No  Eating. No Smoking. No Exercising.  Five minutes before checking your blood pressure: Pee. Sit in a dining chair. Avoid sitting in a soft couch or armchair. Be quiet. Do not talk.

## 2023-08-18 ENCOUNTER — Telehealth: Payer: Self-pay | Admitting: Nurse Practitioner

## 2023-08-18 NOTE — Telephone Encounter (Signed)
 Copied from CRM [id number]. Topic: General - Other >> Aug 15, 2023 12:37 PM Selinda RAMAN wrote: Reason for CRM: Glade the patients Case Manager with [employer] called in stating she was going to fax over some information to the provider because she knows she has an upcoming appointment. She also states if anyone needs to get in touch with her to please call [phone]. Please assist patient further

## 2023-08-22 ENCOUNTER — Inpatient Hospital Stay: Attending: Hematology and Oncology | Admitting: Hematology and Oncology

## 2023-08-22 ENCOUNTER — Inpatient Hospital Stay

## 2023-08-22 ENCOUNTER — Other Ambulatory Visit: Payer: Self-pay

## 2023-08-22 ENCOUNTER — Encounter: Payer: Self-pay | Admitting: Hematology and Oncology

## 2023-08-22 ENCOUNTER — Other Ambulatory Visit (HOSPITAL_COMMUNITY): Payer: Self-pay

## 2023-08-22 VITALS — BP 146/86 | HR 88 | Temp 97.5°F | Resp 18 | Ht 65.0 in | Wt 201.6 lb

## 2023-08-22 DIAGNOSIS — D509 Iron deficiency anemia, unspecified: Secondary | ICD-10-CM | POA: Diagnosis not present

## 2023-08-22 DIAGNOSIS — K5732 Diverticulitis of large intestine without perforation or abscess without bleeding: Secondary | ICD-10-CM | POA: Insufficient documentation

## 2023-08-22 DIAGNOSIS — C642 Malignant neoplasm of left kidney, except renal pelvis: Secondary | ICD-10-CM | POA: Insufficient documentation

## 2023-08-22 DIAGNOSIS — R1084 Generalized abdominal pain: Secondary | ICD-10-CM

## 2023-08-22 LAB — CMP (CANCER CENTER ONLY)
ALT: 19 U/L (ref 0–44)
AST: 17 U/L (ref 15–41)
Albumin: 3.8 g/dL (ref 3.5–5.0)
Alkaline Phosphatase: 71 U/L (ref 38–126)
Anion gap: 9 (ref 5–15)
BUN: 8 mg/dL (ref 6–20)
CO2: 23 mmol/L (ref 22–32)
Calcium: 9.2 mg/dL (ref 8.9–10.3)
Chloride: 105 mmol/L (ref 98–111)
Creatinine: 0.79 mg/dL (ref 0.44–1.00)
GFR, Estimated: >60 mL/min (ref >60)
Glucose, Bld: 123 mg/dL — ABNORMAL HIGH (ref 70–99)
Potassium: 3.6 mmol/L (ref 3.5–5.1)
Sodium: 137 mmol/L (ref 135–145)
Total Bilirubin: 0.3 mg/dL (ref 0.0–1.2)
Total Protein: 7.7 g/dL (ref 6.5–8.1)

## 2023-08-22 LAB — CBC WITH DIFFERENTIAL/PLATELET
Abs Immature Granulocytes: 0.02 K/uL (ref 0.00–0.07)
Basophils Absolute: 0 K/uL (ref 0.0–0.1)
Basophils Relative: 0 %
Eosinophils Absolute: 0.3 K/uL (ref 0.0–0.5)
Eosinophils Relative: 6 %
HCT: 30.4 % — ABNORMAL LOW (ref 36.0–46.0)
Hemoglobin: 9.5 g/dL — ABNORMAL LOW (ref 12.0–15.0)
Immature Granulocytes: 0 %
Lymphocytes Relative: 22 %
Lymphs Abs: 1.1 K/uL (ref 0.7–4.0)
MCH: 24.1 pg — ABNORMAL LOW (ref 26.0–34.0)
MCHC: 31.3 g/dL (ref 30.0–36.0)
MCV: 77.2 fL — ABNORMAL LOW (ref 80.0–100.0)
Monocytes Absolute: 0.9 K/uL (ref 0.1–1.0)
Monocytes Relative: 18 %
Neutro Abs: 2.6 K/uL (ref 1.7–7.7)
Neutrophils Relative %: 54 %
Platelets: 393 K/uL (ref 150–400)
RBC: 3.94 MIL/uL (ref 3.87–5.11)
RDW: 20.2 % — ABNORMAL HIGH (ref 11.5–15.5)
WBC: 4.9 K/uL (ref 4.0–10.5)
nRBC: 0 % (ref 0.0–0.2)

## 2023-08-22 LAB — SAMPLE TO BLOOD BANK

## 2023-08-22 MED ORDER — OXYCODONE HCL 5 MG PO TABS
5.0000 mg | ORAL_TABLET | ORAL | 0 refills | Status: DC | PRN
  Filled 2023-08-22: qty 60, 10d supply, fill #0

## 2023-08-22 NOTE — Assessment & Plan Note (Addendum)
 She has persistent abdominal pain despite being discharged from the hospital with a course of antibiotic for abscess/diverticulitis I will prescribe some pain medicine in the short-term and I would like to repeat CT imaging to ensure resolution of her infection/abscess

## 2023-08-22 NOTE — Assessment & Plan Note (Addendum)
 She has recent diagnosis of severe iron deficiency anemia due to probably GI blood loss Repeat blood count today has improved She does not need blood transfusion or intravenous iron She will continue oral iron supplement

## 2023-08-22 NOTE — Progress Notes (Signed)
 [facility] CONSULT NOTE  Patient Care Team: Paseda, [name], FNP as PCP - General (Nurse Practitioner)  ASSESSMENT & PLAN:  Cancer of left kidney [facility]) The patient was noted to have abnormal imaging study last year Subsequently, she underwent surgical resection of the left kidney mass in May She had CT imaging done in July; formal report is not available but it appears that she might have slight hematoma at the site of surgical resection Based on her pathology report, she does not need adjuvant treatment I will get formal report from [facility] regarding recent CT imaging I plan close monitoring and follow-up  Iron deficiency anemia She has recent diagnosis of severe iron deficiency anemia due to probably GI blood loss Repeat blood count today has improved She does not need blood transfusion or intravenous iron She will continue oral iron supplement   Generalized abdominal pain She has persistent abdominal pain despite being discharged from the hospital with a course of antibiotic for abscess/diverticulitis I will prescribe some pain medicine in the short-term and I would like to repeat CT imaging to ensure resolution of her infection/abscess  Orders Placed This Encounter  Procedures   CT ABDOMEN PELVIS W WO CONTRAST    Standing Status:   Future    Expected Date:   08/29/2023    Expiration Date:   08/21/2024    If indicated for the ordered procedure, I authorize the administration of contrast media per Radiology protocol:   Yes    Does the patient have a contrast media/X-ray dye allergy?:   No    Is patient pregnant?:   No    Preferred imaging location?:   [facility]    If indicated for the ordered procedure, I authorize the administration of oral contrast media per Radiology protocol:   Yes   CBC with Differential/Platelet    Standing Status:   Standing    Number of Occurrences:   22    Expiration Date:   08/21/2024   CMP (Cancer Center only)     Standing Status:   Future    Number of Occurrences:   1    Expiration Date:   08/21/2024   Sample to Blood Bank    Standing Status:   Standing    Number of Occurrences:   33    Expiration Date:   08/21/2024    The total time spent in the appointment was 80 minutes encounter with patients including review of chart and various tests results, discussions about plan of care and coordination of care plan   All questions were answered. The patient knows to call the clinic with any problems, questions or concerns. No barriers to learning was detected.  [name], MD 7/15/20253:03 PM  CHIEF COMPLAINTS/PURPOSE OF CONSULTATION:  Severe iron deficiency anemia, recent diagnosis of kidney cancer  HISTORY OF PRESENTING ILLNESS:  Kaylee Maxwell 48 y.o. female is here because of recent diagnosis of kidney cancer and severe anemia The patient had kidney surgery around May of this year She went back to see her urologist in July with CT imaging CT imaging at that time showed possible abscess and she was sent to the hospital She was hospitalized with IV antibiotics and was discharged home with oral antibiotics She has completed her oral antibiotics but have severe abdominal pain She has loose stool The patient denies any recent signs or symptoms of bleeding such as spontaneous epistaxis, hematuria or hematochezia. From the anemia perspective, she has normal  blood count in 2022 Between 20 22-20 25, her hemoglobin fluctuated up and down, as low as 8.2 She was noted to have iron deficiency confirmed on recent blood work She had elevated platelet count in June but subsequently her platelet count returns back to normal She was prescribed oral iron supplement since recent hospitalization which she tolerated well She has never needed blood transfusion She never donated blood Her oral intake of food is reasonable without any dietary restriction She has no pica  I have reviewed her chart and materials  related to her cancer extensively and collaborated history with the patient. Summary of oncologic history is as follows: Oncology History  Cancer of left kidney (HCC)  12/05/2022 Imaging   1. Although there is mild dilatation of the common bile duct (9 mm), no choledocholithiasis or definite source of obstruction is noted. 2. There is a tiny 6 mm lesion in the pancreatic head adjacent to the ampulla, favored to represent a benign lesions such as a tiny side branch IPMN (intraductal papillary mucinous neoplasm) or pseudocyst. This is unlikely to be a cause of obstruction. However, repeat abdominal MRI with and without IV gadolinium with MRCP is recommended in 1 year to ensure the stability or regression of this finding. This recommendation follows ACR consensus guidelines: Management of Incidental Pancreatic Cysts: A White Paper of the ACR Incidental Findings Committee. J Am Coll Radiol 2017;14:911-923. 3. 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 Referral to Urology for further clinical evaluation and follow-up management is recommended.  4. Mildly complex cyst in segment 8 of the liver, favored to be benign. Attention at time of repeat abdominal MRI with and without IV gadolinium is recommended to ensure stability. 5. Colonic diverticulosis.   07/04/2023 Pathology Results   SURGICAL PATHOLOGY  CASE: [id number]  PATIENT: Kaylee Maxwell  Surgical Pathology Report   Clinical History: Left renal mass (las)   FINAL MICROSCOPIC DIAGNOSIS:   A. 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)  All margins of resection are negative for tumor   KIDNEY: Procedure: Partial Nephrectomy Specimen Laterality: Left Tumor Focality and Site: lower pole,  focal Tumor Size: 3.7 cm Histologic Type: Cystic clear cell renal cell carcinoma Sarcomatoid Features: Negative Rhabdoid Features: Negative Histologic Grade (WHO/ISUP grade): G2 Tumor Necrosis: Negative Tumor Extension: Organ confined Margins: Negative Regional Lymph Nodes:      No lymph nodes submitted or found: x      Number of Lymph Nodes Involved: NA      Number of Lymph Nodes Examined: NA Distant Metastasis: NA Pathologic Stage Classification (pTNM, AJCC 8th Edition):  pT1a, pN Pathologic Findings in Nonneoplastic Kidney: Papillary adenoma Representative tumor block: A1 Comment(s):    07/04/2023 Surgery   Operative Note   Preoperative diagnosis:  1.  3 cm left renal mass   Postoperative diagnosis: 1.  3 cm left renal mass   Procedure(s): 1.  Robot-assisted laparoscopic left partial nephrectomy 2.  Intraoperative ultrasound of single retroperitoneal organ   Surgeon: Lonni Han, MD   Assistants: Alan Hammonds, PA-C An assistant was required for this surgical procedure.  The duties of the assistant included but were not limited to suctioning, passing suture, camera manipulation, retraction.  This procedure would not be able to be performed  without an Geophysicist/field seismologist.    Anesthesia:  General Specimens: 1.  Left renal mass    Intraoperative findings:   Grossly negative margins following excision of left posterior lower pole renal mass The left renorrhaphy was hemostatic at the conclusion of the case 2 renal arteries and single lower pole renal vein     08/22/2023 Initial Diagnosis   Cancer of left kidney (HCC)   08/22/2023 Cancer Staging   Staging form: Kidney, AJCC 8th Edition - Pathologic stage from 08/22/2023: Stage I (pT1a, pN0, cM0) - Signed by Lonn Hicks, MD on 08/22/2023 Stage prefix: Initial diagnosis     MEDICAL HISTORY:  Past Medical History:  Diagnosis Date   Anxiety 03/04/2018   Breast cancer screening 03/04/2018   Heart palpitations 03/04/2018    Monitor 9/21: normal sinus rhythm, sinus tachycardia, Avg HR 110; occ PVCs   Hypertension    Iron deficiency    Irregular heartbeat 03/04/2018   Mild mitral regurgitation    Obesity    Osteochondroma    PVC's (premature ventricular contractions)    Scalp mass 10/09/2012   Sinus tachycardia    Type 2 diabetes mellitus (HCC)     SURGICAL HISTORY: Past Surgical History:  Procedure Laterality Date   ANKLE FRACTURE SURGERY Right    BIOPSY  02/02/2023   Procedure: BIOPSY;  Surgeon: Wilhelmenia Aloha Raddle., MD;  Location: [facility];  Service: Gastroenterology;;   ROMAYNE Right    cyst removal from scalp     ESOPHAGOGASTRODUODENOSCOPY N/A 02/02/2023   Procedure: ESOPHAGOGASTRODUODENOSCOPY (EGD);  Surgeon: Wilhelmenia Aloha Raddle., MD;  Location: [facility];  Service: Gastroenterology;  Laterality: N/A;   EUS N/A 02/02/2023   Procedure: UPPER ENDOSCOPIC ULTRASOUND (EUS) RADIAL;  Surgeon: Wilhelmenia Aloha Raddle., MD;  Location: [facility];  Service: Gastroenterology;  Laterality: N/A;   left leg bone removal     ROBOTIC ASSITED PARTIAL NEPHRECTOMY Left 07/04/2023   Procedure: XI ROBOTIC ASSITED LEFT  PARTIAL NEPHRECTOMY;  Surgeon: Devere Lonni Righter, MD;  Location: [facility];  Service: Urology;  Laterality: Left;  180 MINUTES    SOCIAL HISTORY: Social History   Socioeconomic History   Marital status: Single    Spouse name: Not on file   Number of children: 2   Years of education: Not on file   Highest education level: Associate degree: academic program  Occupational History   Not on file  Tobacco Use   Smoking status: Former    Current packs/day: 0.50    Average packs/day: 0.5 packs/day for 18.0 years (9.0 ttl pk-yrs)    Types: Cigarettes    Passive exposure: Never   Smokeless tobacco: Never  Vaping Use   Vaping status: Every Day   Substances: Nicotine , Flavoring  Substance and Sexual Activity   Alcohol use: Yes    Comment: occasional   Drug use: No    Sexual activity: Yes    Birth control/protection: None  Other Topics Concern   Not on file  Social History Narrative   Lives with her sons.    Social Drivers of Health   Financial Resource Strain: High Risk (03/21/2018)   Overall Financial Resource Strain (CARDIA)    Difficulty of Paying Living Expenses: Hard  Food Insecurity: No Food Insecurity (08/08/2023)   Hunger Vital Sign    Worried About Running Out of Food in the Last Year: Never true    Ran Out of Food in the Last Year: Never true  Transportation Needs: No Transportation Needs (08/08/2023)   PRAPARE - Transportation  Lack of Transportation (Medical): No    Lack of Transportation (Non-Medical): No  Physical Activity: Inactive (03/21/2018)   Exercise Vital Sign    Days of Exercise per Week: 0 days    Minutes of Exercise per Session: 0 min  Stress: Stress Concern Present (03/21/2018)   [employer] of Occupational Health - Occupational Stress Questionnaire    Feeling of Stress : Rather much  Social Connections: Moderately Integrated (03/21/2018)   Social Connection and Isolation Panel    Frequency of Communication with Friends and Family: Three times a week    Frequency of Social Gatherings with Friends and Family: Twice a week    Attends Religious Services: More than 4 times per year    Active Member of [employer] or Organizations: Yes    Attends Banker Meetings: Never    Marital Status: Never married  Intimate Partner Violence: Not At Risk (08/08/2023)   Humiliation, Afraid, Rape, and Kick questionnaire    Fear of Current or Ex-Partner: No    Emotionally Abused: No    Physically Abused: No    Sexually Abused: No    FAMILY HISTORY: Family History  Problem Relation Age of Onset   Diabetes Mother    Hypertension Mother    Obesity Mother    Hypertension Father    Cancer Paternal Aunt        pancreatic   Cancer Maternal Grandmother        lung   Autism spectrum disorder Sister     ALLERGIES:  is  allergic to ozempic  (0.25 or 0.5 mg-dose) [semaglutide (0.25 or 0.5mg -dos)].  MEDICATIONS:  Current Outpatient Medications  Medication Sig Dispense Refill   oxyCODONE  (OXY IR/ROXICODONE ) 5 MG immediate release tablet Take 1 tablet (5 mg total) by mouth every 4 (four) hours as needed for severe pain (pain score 7-10). 60 tablet 0   aspirin  EC 81 MG tablet Take 81 mg by mouth in the morning. Swallow whole.     atorvastatin  (LIPITOR) 10 MG tablet Take 1 tablet (10 mg total) by mouth daily. 90 tablet 1   Blood Glucose Monitoring Suppl (FREESTYLE LITE) w/Device KIT Use as directed 1 kit 0   docusate sodium  (COLACE) 100 MG capsule Take 1 capsule (100 mg total) by mouth 2 (two) times daily. (Patient not taking: Reported on 08/15/2023)     ferrous sulfate  325 (65 FE) MG EC tablet Take 1 tablet (325 mg total) by mouth daily. (Patient taking differently: Take 325 mg by mouth daily with breakfast.) 90 tablet 1   glucose blood (GNP TRUE METRIX GLUCOSE STRIPS) test strip Use as instructed 100 each 12   Lancets (FREESTYLE) lancets Use as directed 100 each 12   metFORMIN  (GLUCOPHAGE -XR) 500 MG 24 hr tablet Take 500 mg with breakfast daily by mouth  for one week , then increase to 500 mg by mouth twice daily with meal (Patient taking differently: Take 500 mg by mouth 2 (two) times daily with a meal.) 180 tablet 1   metoprolol  succinate (TOPROL -XL) 50 MG 24 hr tablet Take 1 tablet (50 mg total) by mouth daily. Take with or immediately following a meal. Please schedule follow up appt for more refills 30 tablet 0   traZODone  (DESYREL ) 50 MG tablet Take 1 tablet (50 mg total) by mouth at bedtime as needed for sleep 90 tablet 1   triamcinolone  cream (KENALOG ) 0.1 % Apply 1 Application topically 2 (two) times daily. 30 g 0   TYLENOL  8 HOUR ARTHRITIS PAIN 650 MG  CR tablet Take 650-1,300 mg by mouth every 8 (eight) hours as needed for pain.     No current facility-administered medications for this visit.    REVIEW OF  SYSTEMS:  All other systems were reviewed with the patient and are negative.  PHYSICAL EXAMINATION: ECOG PERFORMANCE STATUS: 1 - Symptomatic but completely ambulatory  Vitals:   08/22/23 1424  BP: (!) 146/86  Pulse: 88  Resp: 18  Temp: (!) 97.5 F (36.4 C)  SpO2: 100%   Filed Weights   08/22/23 1424  Weight: 201 lb 9.6 oz (91.4 kg)    GENERAL:alert, no distress and comfortable SKIN: skin color, texture, turgor are normal, no rashes or significant lesions EYES: normal, conjunctiva are pink and non-injected, sclera clear OROPHARYNX:no exudate, no erythema and lips, buccal mucosa, and tongue normal  NECK: supple, thyroid normal size, non-tender, without nodularity LYMPH:  no palpable lymphadenopathy in the cervical, axillary or inguinal LUNGS: clear to auscultation and percussion with normal breathing effort HEART: regular rate & rhythm and no murmurs and no lower extremity edema ABDOMEN:abdomen soft, severe abdominal tenderness in the left lower quadrant without rebound or guarding Musculoskeletal:no cyanosis of digits and no clubbing  PSYCH: alert & oriented x 3 with fluent speech NEURO: no focal motor/sensory deficits  LABORATORY DATA:  I have reviewed the data as listed Lab Results  Component Value Date   WBC 4.9 08/22/2023   HGB 9.5 (L) 08/22/2023   HCT 30.4 (L) 08/22/2023   MCV 77.2 (L) 08/22/2023   PLT 393 08/22/2023   Recent Labs    12/06/22 0440 12/07/22 0700 12/07/22 0700 06/09/23 1330 07/05/23 0519 07/21/23 1457 08/08/23 1809 08/10/23 0349  NA 136 133*   < > 140 133* 132* 134* 136  K 3.9 3.4*  --  4.4 3.6 4.6 4.3 3.8  CL 106 106  --  103 103 94* 104 109  CO2 18* 21*  --  15* 20* 21 20* 19*  GLUCOSE 109* 109*   < > 134* 146* 120* 102* 100*  BUN 18 11   < > 12 10 12  22* 8  CREATININE 0.97 0.96  --  0.81 1.20* 1.14* 1.17* 0.99  CALCIUM  9.2 8.7*  --  9.1 8.3* 10.0 9.0 8.9  GFRNONAA >60 >60  --   --  56*  --  58* >60  PROT 6.8 6.9  --  7.8  --   --  8.0   --   ALBUMIN 3.1* 3.1*  --  4.2  --   --  3.5  --   AST 21 14*  --  22  --   --  17  --   ALT 17 17  --  19  --   --  14  --   ALKPHOS 69 59  --  111  --   --  83  --   BILITOT 0.5 0.5  --  0.3  --   --  0.6  --   BILIDIR 0.1  --   --   --   --   --   --   --   IBILI 0.4  --   --   --   --   --   --   --    < > = values in this interval not displayed.    RADIOGRAPHIC STUDIES: I have reviewed her CT imaging from July 2025

## 2023-08-22 NOTE — Assessment & Plan Note (Signed)
 The patient was noted to have abnormal imaging study last year Subsequently, she underwent surgical resection of the left kidney mass in May She had CT imaging done in July; formal report is not available but it appears that she might have slight hematoma at the site of surgical resection Based on her pathology report, she does not need adjuvant treatment I will get formal report from [facility] urology regarding recent CT imaging I plan close monitoring and follow-up

## 2023-08-23 ENCOUNTER — Other Ambulatory Visit: Payer: Self-pay

## 2023-08-29 ENCOUNTER — Ambulatory Visit (HOSPITAL_COMMUNITY)
Admission: RE | Admit: 2023-08-29 | Discharge: 2023-08-29 | Disposition: A | Discharge status: Home / Self Care | Source: Ambulatory Visit | Attending: Hematology and Oncology | Admitting: Hematology and Oncology

## 2023-08-29 DIAGNOSIS — K5732 Diverticulitis of large intestine without perforation or abscess without bleeding: Secondary | ICD-10-CM | POA: Diagnosis not present

## 2023-08-29 DIAGNOSIS — R188 Other ascites: Secondary | ICD-10-CM | POA: Diagnosis not present

## 2023-08-29 DIAGNOSIS — R1084 Generalized abdominal pain: Secondary | ICD-10-CM | POA: Insufficient documentation

## 2023-08-29 DIAGNOSIS — C642 Malignant neoplasm of left kidney, except renal pelvis: Secondary | ICD-10-CM | POA: Insufficient documentation

## 2023-08-29 DIAGNOSIS — Z905 Acquired absence of kidney: Secondary | ICD-10-CM | POA: Diagnosis not present

## 2023-08-29 MED ORDER — IOHEXOL 300 MG/ML  SOLN
100.0000 mL | Freq: Once | INTRAMUSCULAR | Status: AC | PRN
  Administered 2023-08-29: 100 mL via INTRAVENOUS

## 2023-08-31 ENCOUNTER — Encounter (HOSPITAL_COMMUNITY): Payer: Self-pay | Admitting: Emergency Medicine

## 2023-08-31 ENCOUNTER — Inpatient Hospital Stay (HOSPITAL_COMMUNITY)
Admission: EM | Admit: 2023-08-31 | Discharge: 2023-09-04 | DRG: 392 | Disposition: A | Discharge status: Home / Self Care | Source: Ambulatory Visit | Attending: Internal Medicine | Admitting: Internal Medicine

## 2023-08-31 ENCOUNTER — Ambulatory Visit: Payer: Self-pay | Admitting: Hematology and Oncology

## 2023-08-31 ENCOUNTER — Other Ambulatory Visit: Payer: Self-pay

## 2023-08-31 ENCOUNTER — Emergency Department (HOSPITAL_COMMUNITY)

## 2023-08-31 DIAGNOSIS — E119 Type 2 diabetes mellitus without complications: Secondary | ICD-10-CM | POA: Diagnosis not present

## 2023-08-31 DIAGNOSIS — Z90710 Acquired absence of both cervix and uterus: Secondary | ICD-10-CM | POA: Diagnosis not present

## 2023-08-31 DIAGNOSIS — E66811 Obesity, class 1: Secondary | ICD-10-CM | POA: Diagnosis present

## 2023-08-31 DIAGNOSIS — Z7984 Long term (current) use of oral hypoglycemic drugs: Secondary | ICD-10-CM

## 2023-08-31 DIAGNOSIS — I10 Essential (primary) hypertension: Secondary | ICD-10-CM | POA: Diagnosis present

## 2023-08-31 DIAGNOSIS — F17210 Nicotine dependence, cigarettes, uncomplicated: Secondary | ICD-10-CM | POA: Diagnosis not present

## 2023-08-31 DIAGNOSIS — F419 Anxiety disorder, unspecified: Secondary | ICD-10-CM | POA: Diagnosis present

## 2023-08-31 DIAGNOSIS — K5732 Diverticulitis of large intestine without perforation or abscess without bleeding: Principal | ICD-10-CM | POA: Diagnosis present

## 2023-08-31 DIAGNOSIS — E785 Hyperlipidemia, unspecified: Secondary | ICD-10-CM | POA: Diagnosis present

## 2023-08-31 DIAGNOSIS — Z888 Allergy status to other drugs, medicaments and biological substances status: Secondary | ICD-10-CM | POA: Diagnosis not present

## 2023-08-31 DIAGNOSIS — E876 Hypokalemia: Secondary | ICD-10-CM | POA: Diagnosis not present

## 2023-08-31 DIAGNOSIS — D509 Iron deficiency anemia, unspecified: Secondary | ICD-10-CM | POA: Diagnosis present

## 2023-08-31 DIAGNOSIS — Z905 Acquired absence of kidney: Secondary | ICD-10-CM | POA: Diagnosis not present

## 2023-08-31 DIAGNOSIS — Z833 Family history of diabetes mellitus: Secondary | ICD-10-CM

## 2023-08-31 DIAGNOSIS — Z6833 Body mass index (BMI) 33.0-33.9, adult: Secondary | ICD-10-CM | POA: Diagnosis not present

## 2023-08-31 DIAGNOSIS — Z7982 Long term (current) use of aspirin: Secondary | ICD-10-CM | POA: Diagnosis not present

## 2023-08-31 DIAGNOSIS — C642 Malignant neoplasm of left kidney, except renal pelvis: Secondary | ICD-10-CM | POA: Diagnosis not present

## 2023-08-31 DIAGNOSIS — Z79899 Other long term (current) drug therapy: Secondary | ICD-10-CM | POA: Diagnosis not present

## 2023-08-31 DIAGNOSIS — R932 Abnormal findings on diagnostic imaging of liver and biliary tract: Secondary | ICD-10-CM | POA: Diagnosis not present

## 2023-08-31 DIAGNOSIS — Z8249 Family history of ischemic heart disease and other diseases of the circulatory system: Secondary | ICD-10-CM

## 2023-08-31 DIAGNOSIS — Z800 Family history of malignant neoplasm of digestive organs: Secondary | ICD-10-CM | POA: Diagnosis not present

## 2023-08-31 DIAGNOSIS — F17290 Nicotine dependence, other tobacco product, uncomplicated: Secondary | ICD-10-CM | POA: Diagnosis present

## 2023-08-31 DIAGNOSIS — N281 Cyst of kidney, acquired: Secondary | ICD-10-CM | POA: Diagnosis not present

## 2023-08-31 LAB — URINALYSIS, ROUTINE W REFLEX MICROSCOPIC
Bilirubin Urine: NEGATIVE
Glucose, UA: NEGATIVE mg/dL
Hgb urine dipstick: NEGATIVE
Ketones, ur: NEGATIVE mg/dL
Leukocytes,Ua: NEGATIVE
Nitrite: NEGATIVE
Protein, ur: NEGATIVE mg/dL
Specific Gravity, Urine: 1.013 (ref 1.005–1.030)
pH: 6 (ref 5.0–8.0)

## 2023-08-31 LAB — CBC
HCT: 35.5 % — ABNORMAL LOW (ref 36.0–46.0)
Hemoglobin: 10.2 g/dL — ABNORMAL LOW (ref 12.0–15.0)
MCH: 23.1 pg — ABNORMAL LOW (ref 26.0–34.0)
MCHC: 28.7 g/dL — ABNORMAL LOW (ref 30.0–36.0)
MCV: 80.3 fL (ref 80.0–100.0)
Platelets: 480 K/uL — ABNORMAL HIGH (ref 150–400)
RBC: 4.42 MIL/uL (ref 3.87–5.11)
RDW: 18.8 % — ABNORMAL HIGH (ref 11.5–15.5)
WBC: 5.8 K/uL (ref 4.0–10.5)
nRBC: 0 % (ref 0.0–0.2)

## 2023-08-31 LAB — COMPREHENSIVE METABOLIC PANEL WITH GFR
ALT: 14 U/L (ref 0–44)
AST: 19 U/L (ref 15–41)
Albumin: 3.8 g/dL (ref 3.5–5.0)
Alkaline Phosphatase: 61 U/L (ref 38–126)
Anion gap: 11 (ref 5–15)
BUN: 8 mg/dL (ref 6–20)
CO2: 22 mmol/L (ref 22–32)
Calcium: 9.6 mg/dL (ref 8.9–10.3)
Chloride: 105 mmol/L (ref 98–111)
Creatinine, Ser: 0.86 mg/dL (ref 0.44–1.00)
GFR, Estimated: >60 mL/min (ref >60)
Glucose, Bld: 136 mg/dL — ABNORMAL HIGH (ref 70–99)
Potassium: 4 mmol/L (ref 3.5–5.1)
Sodium: 138 mmol/L (ref 135–145)
Total Bilirubin: 0.6 mg/dL (ref 0.0–1.2)
Total Protein: 8.6 g/dL — ABNORMAL HIGH (ref 6.5–8.1)

## 2023-08-31 LAB — HCG, SERUM, QUALITATIVE: Preg, Serum: NEGATIVE

## 2023-08-31 LAB — LIPASE, BLOOD: Lipase: 36 U/L (ref 11–51)

## 2023-08-31 MED ORDER — ACETAMINOPHEN 325 MG PO TABS: 650.0000 mg | ORAL_TABLET | Freq: Four times a day (QID) | ORAL | Status: DC | PRN

## 2023-08-31 MED ORDER — ACETAMINOPHEN 650 MG RE SUPP: 650.0000 mg | Freq: Four times a day (QID) | RECTAL | Status: DC | PRN

## 2023-08-31 MED ORDER — OXYCODONE HCL 5 MG PO TABS
5.0000 mg | ORAL_TABLET | Freq: Four times a day (QID) | ORAL | Status: DC | PRN
  Administered 2023-09-02 – 2023-09-04 (×4): 5 mg via ORAL
  Filled 2023-08-31 (×4): qty 1

## 2023-08-31 MED ORDER — SODIUM CHLORIDE 0.9 % IV SOLN: INTRAVENOUS | Status: DC

## 2023-08-31 MED ORDER — ONDANSETRON 4 MG PO TBDP: 4.0000 mg | ORAL_TABLET | Freq: Three times a day (TID) | ORAL | Status: DC | PRN

## 2023-08-31 MED ORDER — HYDROMORPHONE HCL 1 MG/ML IJ SOLN
1.0000 mg | Freq: Once | INTRAMUSCULAR | Status: AC
  Administered 2023-08-31: 1 mg via INTRAVENOUS
  Filled 2023-08-31: qty 1

## 2023-08-31 MED ORDER — NALOXONE HCL 0.4 MG/ML IJ SOLN: 0.4000 mg | INTRAMUSCULAR | Status: DC | PRN

## 2023-08-31 MED ORDER — HYDROMORPHONE HCL 1 MG/ML IJ SOLN
1.0000 mg | INTRAMUSCULAR | Status: DC | PRN
  Administered 2023-08-31 – 2023-09-03 (×9): 1 mg via INTRAVENOUS
  Filled 2023-08-31 (×9): qty 1

## 2023-08-31 MED ORDER — IOHEXOL 300 MG/ML  SOLN
100.0000 mL | Freq: Once | INTRAMUSCULAR | Status: AC | PRN
  Administered 2023-08-31: 100 mL via INTRAVENOUS

## 2023-08-31 MED ORDER — PIPERACILLIN-TAZOBACTAM 3.375 G IVPB 30 MIN
3.3750 g | Freq: Once | INTRAVENOUS | Status: AC
  Administered 2023-08-31: 3.375 g via INTRAVENOUS
  Filled 2023-08-31: qty 50

## 2023-08-31 MED ORDER — SODIUM CHLORIDE 0.9 % IV BOLUS
1000.0000 mL | Freq: Once | INTRAVENOUS | Status: AC
  Administered 2023-08-31: 1000 mL via INTRAVENOUS

## 2023-08-31 MED ORDER — INSULIN ASPART 100 UNIT/ML IJ SOLN: 0.0000 [IU] | Freq: Every day | INTRAMUSCULAR | Status: DC

## 2023-08-31 MED ORDER — PIPERACILLIN-TAZOBACTAM 3.375 G IVPB
3.3750 g | Freq: Three times a day (TID) | INTRAVENOUS | Status: DC
  Administered 2023-09-01 – 2023-09-04 (×10): 3.375 g via INTRAVENOUS
  Filled 2023-08-31 (×10): qty 50

## 2023-08-31 MED ORDER — INSULIN ASPART 100 UNIT/ML IJ SOLN: 0.0000 [IU] | Freq: Three times a day (TID) | INTRAMUSCULAR | Status: DC

## 2023-08-31 MED ORDER — INSULIN ASPART 100 UNIT/ML IJ SOLN
0.0000 [IU] | INTRAMUSCULAR | Status: DC
  Administered 2023-09-01: 1 [IU] via SUBCUTANEOUS

## 2023-08-31 NOTE — Progress Notes (Signed)
 ED Pharmacy Antibiotic Sign Off An antibiotic consult was received from an ED provider for piperacillin /tazobactam per pharmacy dosing for intra-abdominal infection. A chart review was completed to assess appropriateness.   The following one time order(s) were placed:  Piperacillin /tazobactam 3.375g IV once infused over 30 mins  Further antibiotic and/or antibiotic pharmacy consults should be ordered by the admitting provider if indicated.   Ronal CHRISTELLA Rav, PharmD 08/31/23 8:53 PM

## 2023-08-31 NOTE — Telephone Encounter (Signed)
-----   Message from [name] sent at 08/31/2023  8:15 AM EDT ----- Please call her, CT impression #3 suggest possible perforation. I recommend the patient to go to ER for evaluation ----- Message ----- From: Interface, Rad Results In Sent: 08/30/2023   4:40 PM EDT To: [name], MD

## 2023-08-31 NOTE — ED Triage Notes (Signed)
 Pt presents reporting having an outpatient CT scan on Tuesday which showed diverticulitis. Has been having n/v/d but none today.  Right upper and lower abdominal pain.  States her physician told her to come to the ED for treatment.

## 2023-08-31 NOTE — Telephone Encounter (Signed)
 Notified of message below. Will go to the ED

## 2023-08-31 NOTE — H&P (Signed)
 History and Physical    Kaylee Maxwell FMW:[id number] DOB: Aug 01, 1975 DOA: 08/31/2023  PCP: Paseda, [name], FNP  Patient coming from: Home  Chief Complaint: Abdominal pain  HPI: Kaylee Maxwell is a 48 y.o. female with medical history significant of hypertension, hyperlipidemia, non-insulin -dependent type 2 diabetes, renal mass/RCC status post left partial nephrectomy on 07/04/2023, iron deficiency anemia, mild mitral regurgitation, class I obesity, osteochondroma, history of palpitations/PVCs, gastritis/duodenitis, scalp mass, anxiety.  She was admitted to the hospital earlier this month 7/1-7/3 for acute sigmoid diverticulitis with a small abscess not amenable to IR drainage.  She was treated with IV Zosyn  while hospitalized and switched to p.o. ciprofloxacin /Flagyl  on discharge for 7 more days.  She had a hospital follow-up visit with oncology on 7/15 and due to ongoing abdominal pain had repeat CT abdomen pelvis ordered which was done on 7/22 and showed persistent sigmoid diverticulitis with possible perforation.  She was advised to come into the ED for further evaluation.  Patient states she has continued to have ongoing severe left lower quadrant abdominal pain since she left the hospital earlier this month.  She has already finished taking antibiotics.  Denies fevers or chills.  Vomiting stopped 4 days ago but continues to have nausea.  Denies noticing any blood in her stool.  Denies chest pain or shortness of breath.  ED Course: Vital signs stable.  Labs showing no leukocytosis, hemoglobin 10.2 (stable), platelet count 480k, glucose 136, total protein 8.6, normal lipase and LFTs, serum hCG negative, UA not suggestive of infection.    Repeat CT abdomen pelvis with contrast showing persistent sigmoid diverticulitis but no evidence of associated perforation or abscess.  Showing stable hepatic cyst versus hemangioma.  Stable left renal cyst and postoperative changes along the lower pole  of the left kidney.  Patient was given IV Dilaudid , Zofran , and 1 L normal saline.  Review of Systems:  Review of Systems  All other systems reviewed and are negative.   Past Medical History:  Diagnosis Date   Anxiety 03/04/2018   Breast cancer screening 03/04/2018   Heart palpitations 03/04/2018   Monitor 9/21: normal sinus rhythm, sinus tachycardia, Avg HR 110; occ PVCs   Hypertension    Iron deficiency    Irregular heartbeat 03/04/2018   Mild mitral regurgitation    Obesity    Osteochondroma    PVC's (premature ventricular contractions)    Scalp mass 10/09/2012   Sinus tachycardia    Type 2 diabetes mellitus [facility])     Past Surgical History:  Procedure Laterality Date   ANKLE FRACTURE SURGERY Right    BIOPSY  02/02/2023   Procedure: BIOPSY;  Surgeon: Wilhelmenia Aloha Raddle., MD;  Location: [facility];  Service: Gastroenterology;;   ROMAYNE Right    cyst removal from scalp     ESOPHAGOGASTRODUODENOSCOPY N/A 02/02/2023   Procedure: ESOPHAGOGASTRODUODENOSCOPY (EGD);  Surgeon: Wilhelmenia Aloha Raddle., MD;  Location: [facility];  Service: Gastroenterology;  Laterality: N/A;   EUS N/A 02/02/2023   Procedure: UPPER ENDOSCOPIC ULTRASOUND (EUS) RADIAL;  Surgeon: Wilhelmenia Aloha Raddle., MD;  Location: [facility];  Service: Gastroenterology;  Laterality: N/A;   left leg bone removal     ROBOTIC ASSITED PARTIAL NEPHRECTOMY Left 07/04/2023   Procedure: XI ROBOTIC ASSITED LEFT  PARTIAL NEPHRECTOMY;  Surgeon: Devere Lonni Righter, MD;  Location: [facility];  Service: Urology;  Laterality: Left;  180 MINUTES     reports that she has quit smoking. Her smoking use included cigarettes. She has a 9 pack-year  smoking history. She has never been exposed to tobacco smoke. She has never used smokeless tobacco. She reports current alcohol use. She reports that she does not use drugs.  Allergies  Allergen Reactions   Ozempic  (0.25 Or 0.5 Mg-Dose) [Semaglutide (0.25 Or 0.5mg -Dos)]  Other (See Comments)    Abdominal pain    Family History  Problem Relation Age of Onset   Diabetes Mother    Hypertension Mother    Obesity Mother    Hypertension Father    Cancer Paternal Aunt        pancreatic   Cancer Maternal Grandmother        lung   Autism spectrum disorder Sister     Prior to Admission medications   Medication Sig Start Date End Date Taking? Authorizing Provider  aspirin  EC 81 MG tablet Take 81 mg by mouth in the morning. Swallow whole.    [provider]  atorvastatin  (LIPITOR) 10 MG tablet Take 1 tablet (10 mg total) by mouth daily. 06/06/23   Paseda, [name], FNP  Blood Glucose Monitoring Suppl (FREESTYLE LITE) w/Device KIT Use as directed 04/19/22   Paseda, [name], FNP  docusate sodium  (COLACE) 100 MG capsule Take 1 capsule (100 mg total) by mouth 2 (two) times daily. Patient not taking: Reported on 08/15/2023 08/10/23 09/09/23  Willette Adriana LABOR, MD  ferrous sulfate  325 (65 FE) MG EC tablet Take 1 tablet (325 mg total) by mouth daily. Patient taking differently: Take 325 mg by mouth daily with breakfast. 06/13/23   Paseda, [name], FNP  glucose blood (GNP TRUE METRIX GLUCOSE STRIPS) test strip Use as instructed 04/19/22   Paseda, [name], FNP  Lancets (FREESTYLE) lancets Use as directed 04/19/22   Paseda, [name], FNP  metFORMIN  (GLUCOPHAGE -XR) 500 MG 24 hr tablet Take 500 mg with breakfast daily by mouth  for one week , then increase to 500 mg by mouth twice daily with meal Patient taking differently: Take 500 mg by mouth 2 (two) times daily with a meal. 06/07/23   Paseda, [name], FNP  metoprolol  succinate (TOPROL -XL) 50 MG 24 hr tablet Take 1 tablet (50 mg total) by mouth daily. Take with or immediately following a meal. Please schedule follow up appt for more refills 08/01/23   Nahser, Aleene PARAS, MD  oxyCODONE  (OXY IR/ROXICODONE ) 5 MG immediate release tablet Take 1 tablet (5 mg total) by mouth every 4 (four) hours as needed for  severe pain (pain score 7-10). 08/22/23   Lonn Hicks, MD  traZODone  (DESYREL ) 50 MG tablet Take 1 tablet (50 mg total) by mouth at bedtime as needed for sleep 05/03/23   Paseda, [name], FNP  triamcinolone  cream (KENALOG ) 0.1 % Apply 1 Application topically 2 (two) times daily. 07/21/23   Paseda, [name], FNP  TYLENOL  8 HOUR ARTHRITIS PAIN 650 MG CR tablet Take 650-1,300 mg by mouth every 8 (eight) hours as needed for pain.    [provider]  cetirizine  (ZYRTEC  ALLERGY) 10 MG tablet Take 1 tablet (10 mg total) by mouth daily. 03/04/20 04/20/20  Christopher Savannah, PA-C    Physical Exam: Vitals:   08/31/23 2015 08/31/23 2024 08/31/23 2205 08/31/23 2223  BP: 125/74  (!) 156/75   Pulse: 78  60   Resp: 17  17   Temp:  98.3 F (36.8 C) 98.3 F (36.8 C)   TempSrc:   Oral   SpO2: 100%  100%   Weight:    91.2 kg  Height:  5' 5 (1.651 m)    Physical Exam Vitals reviewed.  Constitutional:      General: She is not in acute distress. HENT:     Head: Normocephalic and atraumatic.  Eyes:     Extraocular Movements: Extraocular movements intact.  Cardiovascular:     Rate and Rhythm: Normal rate and regular rhythm.     Pulses: Normal pulses.  Pulmonary:     Effort: Pulmonary effort is normal. No respiratory distress.     Breath sounds: Normal breath sounds. No wheezing or rales.  Abdominal:     General: Bowel sounds are normal. There is no distension.     Palpations: Abdomen is soft.     Tenderness: There is abdominal tenderness. There is no guarding or rebound.     Comments: Left lower quadrant tender to palpation  Musculoskeletal:     Cervical back: Normal range of motion.     Right lower leg: No edema.     Left lower leg: No edema.  Skin:    General: Skin is warm and dry.  Neurological:     General: No focal deficit present.     Mental Status: She is alert and oriented to person, place, and time.     Labs on Admission: I have personally reviewed following labs and  imaging studies  CBC: Recent Labs  Lab 08/31/23 1115  WBC 5.8  HGB 10.2*  HCT 35.5*  MCV 80.3  PLT 480*   Basic Metabolic Panel: Recent Labs  Lab 08/31/23 1115  NA 138  K 4.0  CL 105  CO2 22  GLUCOSE 136*  BUN 8  CREATININE 0.86  CALCIUM  9.6   GFR: Estimated Creatinine Clearance: 89.3 mL/min (by C-G formula based on SCr of 0.86 mg/dL). Liver Function Tests: Recent Labs  Lab 08/31/23 1115  AST 19  ALT 14  ALKPHOS 61  BILITOT 0.6  PROT 8.6*  ALBUMIN 3.8   Recent Labs  Lab 08/31/23 1115  LIPASE 36   No results for input(s): AMMONIA in the last 168 hours. Coagulation Profile: No results for input(s): INR, PROTIME in the last 168 hours. Cardiac Enzymes: No results for input(s): CKTOTAL, CKMB, CKMBINDEX, TROPONINI in the last 168 hours. BNP (last 3 results) No results for input(s): PROBNP in the last 8760 hours. HbA1C: No results for input(s): HGBA1C in the last 72 hours. CBG: No results for input(s): GLUCAP in the last 168 hours. Lipid Profile: No results for input(s): CHOL, HDL, LDLCALC, TRIG, CHOLHDL, LDLDIRECT in the last 72 hours. Thyroid Function Tests: No results for input(s): TSH, T4TOTAL, FREET4, T3FREE, THYROIDAB in the last 72 hours. Anemia Panel: No results for input(s): VITAMINB12, FOLATE, FERRITIN, TIBC, IRON, RETICCTPCT in the last 72 hours. Urine analysis:    Component Value Date/Time   COLORURINE STRAW (A) 08/31/2023 2002   APPEARANCEUR CLEAR 08/31/2023 2002   LABSPEC 1.013 08/31/2023 2002   PHURINE 6.0 08/31/2023 2002   GLUCOSEU NEGATIVE 08/31/2023 2002   HGBUR NEGATIVE 08/31/2023 2002   BILIRUBINUR NEGATIVE 08/31/2023 2002   BILIRUBINUR negative 04/16/2021 1419   BILIRUBINUR neg 09/05/2019 1337   KETONESUR NEGATIVE 08/31/2023 2002   PROTEINUR NEGATIVE 08/31/2023 2002   UROBILINOGEN 0.2 04/16/2021 1419   UROBILINOGEN 1.0 03/24/2007 0542   NITRITE NEGATIVE 08/31/2023 2002    LEUKOCYTESUR NEGATIVE 08/31/2023 2002    Radiological Exams on Admission: CT ABDOMEN PELVIS W CONTRAST Result Date: 08/31/2023 CLINICAL DATA:  Suspected diverticulitis. EXAM: CT ABDOMEN AND PELVIS WITH CONTRAST TECHNIQUE: Multidetector CT imaging of the  abdomen and pelvis was performed using the standard protocol following bolus administration of intravenous contrast. RADIATION DOSE REDUCTION: This exam was performed according to the departmental dose-optimization program which includes automated exposure control, adjustment of the mA and/or kV according to patient size and/or use of iterative reconstruction technique. CONTRAST:  OMNIPAQUE  IOHEXOL  300 MG/ML  SOLN COMPARISON:  August 29, 2023 FINDINGS: Lower chest: No acute abnormality. Hepatobiliary: A stable hepatic cyst versus hemangioma is seen within the right lobe of the liver. No gallstones, gallbladder wall thickening, or biliary dilatation. Pancreas: Unremarkable. No pancreatic ductal dilatation or surrounding inflammatory changes. Spleen: Normal in size without focal abnormality. Adrenals/Urinary Tract: Adrenal glands are unremarkable. Kidneys are normal in size, without renal calculi or hydronephrosis. A stable renal cyst is seen along the posterior aspect of the lower pole of the left kidney. Postoperative changes within this region are also seen. Bladder is unremarkable. Stomach/Bowel: Stomach is within normal limits. Appendix appears normal. No evidence of bowel dilatation persistent moderately inflamed diverticula are seen within the mid sigmoid colon. There is no evidence of associated perforation or abscess noninflamed diverticula are noted throughout the remainder of the large bowel. Vascular/Lymphatic: No significant vascular findings are present. A retroaortic left renal vein is seen. No enlarged abdominal or pelvic lymph nodes. Reproductive: Status post hysterectomy. No adnexal masses. Other: No abdominal wall hernia or abnormality. No  abdominopelvic ascites. Musculoskeletal: Left iliac bone and chondromas are noted. No acute osseous abnormalities are identified IMPRESSION: 1. Persistent sigmoid diverticulitis, without evidence of associated perforation or abscess. 2. Stable hepatic cyst versus hemangioma. 3. Stable left renal cyst and postoperative changes along the lower pole of the left kidney. Electronically Signed   By: Suzen Dials M.D.   On: 08/31/2023 19:46    Assessment and Plan  Persistent sigmoid diverticulitis Failed outpatient antibiotic therapy.  CT without evidence of perforation or abscess.  No fever, leukocytosis, or signs of sepsis.  Continue Zosyn , bowel rest, IV fluid hydration, pain control, and antiemetic as needed (EKG ordered to check QT interval).  Monitor WBC count.  Non-insulin -dependent type 2 diabetes A1c 7.1 on 06/06/2023.  Placed on sensitive sliding scale insulin .  Iron deficiency anemia Hemoglobin stable, monitor labs.  Hypertension Hyperlipidemia History of palpitations History of gastritis/duodenitis Anxiety Pharmacy med rec pending.  DVT prophylaxis: SCDs.  Avoiding anticoagulation due to possible hepatic hemangioma on CT. Code Status: Full Code (discussed with the patient) Family Communication: No family available at this time. Level of care: Telemetry bed Admission status: It is my clinical opinion that referral for OBSERVATION is reasonable and necessary in this patient based on the above information provided. The aforementioned taken together are felt to place the patient at high risk for further clinical deterioration. However, it is anticipated that the patient may be medically stable for discharge from the hospital within 24 to 48 hours.  Editha Ram MD [facility]  If 7PM-7AM, please contact night-coverage [email or web address]  08/31/2023, 10:40 PM

## 2023-08-31 NOTE — ED Provider Notes (Signed)
 [place] EMERGENC[facility] DEPARTMENT AT [facility] Provider Note   CSN: [id number] Arrival date & time: 08/31/23  1050     Patient presents with: Abdominal Pain   Kaylee Maxwell is a 48 y.o. female.   HPI 48 year old female presents with abdominal pain and an abnormal CT scan.  Patient has been dealing with abdominal pain for a couple months.  She was admitted to the hospital about 3 weeks ago for diverticulitis.  She completed her course of antibiotics but the abdominal pain has been unchanged.  It is right upper and lower abdomen.  No fevers.  She had vomiting about 5 or 6 days ago but none since.  No diarrhea or blood in the stool.  Patient had a CT scan 2 days ago and was told today that the results were abnormal and to come to the hospital.  Right now her pain is about a 7 out of 10.  No dysuria or hematuria.  Prior to Admission medications   Medication Sig Start Date End Date Taking? Authorizing Provider  aspirin  EC 81 MG tablet Take 81 mg by mouth in the morning. Swallow whole.    [provider]  atorvastatin  (LIPITOR) 10 MG tablet Take 1 tablet (10 mg total) by mouth daily. 06/06/23   Paseda, [name], FNP  Blood Glucose Monitoring Suppl (FREESTYLE LITE) w/Device KIT Use as directed 04/19/22   Paseda, [name], FNP  docusate sodium  (COLACE) 100 MG capsule Take 1 capsule (100 mg total) by mouth 2 (two) times daily. Patient not taking: Reported on 08/15/2023 08/10/23 09/09/23  Willette Adriana LABOR, MD  ferrous sulfate  325 (65 FE) MG EC tablet Take 1 tablet (325 mg total) by mouth daily. Patient taking differently: Take 325 mg by mouth daily with breakfast. 06/13/23   Paseda, [name], FNP  glucose blood (GNP TRUE METRIX GLUCOSE STRIPS) test strip Use as instructed 04/19/22   Paseda, [name], FNP  Lancets (FREESTYLE) lancets Use as directed 04/19/22   Paseda, [name], FNP  metFORMIN  (GLUCOPHAGE -XR) 500 MG 24 hr tablet Take 500 mg with breakfast daily by mouth   for one week , then increase to 500 mg by mouth twice daily with meal Patient taking differently: Take 500 mg by mouth 2 (two) times daily with a meal. 06/07/23   Paseda, [name], FNP  metoprolol  succinate (TOPROL -XL) 50 MG 24 hr tablet Take 1 tablet (50 mg total) by mouth daily. Take with or immediately following a meal. Please schedule follow up appt for more refills 08/01/23   Nahser, Aleene PARAS, MD  oxyCODONE  (OXY IR/ROXICODONE ) 5 MG immediate release tablet Take 1 tablet (5 mg total) by mouth every 4 (four) hours as needed for severe pain (pain score 7-10). 08/22/23   Lonn Hicks, MD  traZODone  (DESYREL ) 50 MG tablet Take 1 tablet (50 mg total) by mouth at bedtime as needed for sleep 05/03/23   Paseda, [name], FNP  triamcinolone  cream (KENALOG ) 0.1 % Apply 1 Application topically 2 (two) times daily. 07/21/23   Paseda, [name], FNP  TYLENOL  8 HOUR ARTHRITIS PAIN 650 MG CR tablet Take 650-1,300 mg by mouth every 8 (eight) hours as needed for pain.    [provider]  cetirizine  (ZYRTEC  ALLERGY) 10 MG tablet Take 1 tablet (10 mg total) by mouth daily. 03/04/20 04/20/20  Christopher Savannah, PA-C    Allergies: Ozempic  (0.25 or 0.5 mg-dose) [semaglutide (0.25 or 0.5mg -dos)]    Review of Systems  Constitutional:  Negative  for fever.  Gastrointestinal:  Positive for abdominal pain. Negative for diarrhea and vomiting.  Genitourinary:  Negative for dysuria and hematuria.    Updated Vital Signs BP 125/74   Pulse 78   Temp 98.3 F (36.8 C)   Resp 17   SpO2 100%   Physical Exam Vitals and nursing note reviewed.  Constitutional:      General: She is not in acute distress.    Appearance: She is well-developed. She is not ill-appearing or diaphoretic.  HENT:     Head: Normocephalic and atraumatic.  Pulmonary:     Effort: Pulmonary effort is normal.  Abdominal:     Palpations: Abdomen is soft.     Tenderness: There is abdominal tenderness in the right upper quadrant and right lower  quadrant.  Skin:    General: Skin is warm and dry.  Neurological:     Mental Status: She is alert.     (all labs ordered are listed, but only abnormal results are displayed) Labs Reviewed  COMPREHENSIVE METABOLIC PANEL WITH GFR - Abnormal; Notable for the following components:      Result Value   Glucose, Bld 136 (*)    Total Protein 8.6 (*)    All other components within normal limits  CBC - Abnormal; Notable for the following components:   Hemoglobin 10.2 (*)    HCT 35.5 (*)    MCH 23.1 (*)    MCHC 28.7 (*)    RDW 18.8 (*)    Platelets 480 (*)    All other components within normal limits  URINALYSIS, ROUTINE W REFLEX MICROSCOPIC - Abnormal; Notable for the following components:   Color, Urine STRAW (*)    All other components within normal limits  LIPASE, BLOOD  HCG, SERUM, QUALITATIVE    EKG: None  Radiology: CT ABDOMEN PELVIS W CONTRAST Result Date: 08/31/2023 CLINICAL DATA:  Suspected diverticulitis. EXAM: CT ABDOMEN AND PELVIS WITH CONTRAST TECHNIQUE: Multidetector CT imaging of the abdomen and pelvis was performed using the standard protocol following bolus administration of intravenous contrast. RADIATION DOSE REDUCTION: This exam was performed according to the departmental dose-optimization program which includes automated exposure control, adjustment of the mA and/or kV according to patient size and/or use of iterative reconstruction technique. CONTRAST:  OMNIPAQUE  IOHEXOL  300 MG/ML  SOLN COMPARISON:  August 29, 2023 FINDINGS: Lower chest: No acute abnormality. Hepatobiliary: A stable hepatic cyst versus hemangioma is seen within the right lobe of the liver. No gallstones, gallbladder wall thickening, or biliary dilatation. Pancreas: Unremarkable. No pancreatic ductal dilatation or surrounding inflammatory changes. Spleen: Normal in size without focal abnormality. Adrenals/Urinary Tract: Adrenal glands are unremarkable. Kidneys are normal in size, without renal  calculi or hydronephrosis. A stable renal cyst is seen along the posterior aspect of the lower pole of the left kidney. Postoperative changes within this region are also seen. Bladder is unremarkable. Stomach/Bowel: Stomach is within normal limits. Appendix appears normal. No evidence of bowel dilatation persistent moderately inflamed diverticula are seen within the mid sigmoid colon. There is no evidence of associated perforation or abscess noninflamed diverticula are noted throughout the remainder of the large bowel. Vascular/Lymphatic: No significant vascular findings are present. A retroaortic left renal vein is seen. No enlarged abdominal or pelvic lymph nodes. Reproductive: Status post hysterectomy. No adnexal masses. Other: No abdominal wall hernia or abnormality. No abdominopelvic ascites. Musculoskeletal: Left iliac bone and chondromas are noted. No acute osseous abnormalities are identified IMPRESSION: 1. Persistent sigmoid diverticulitis, without evidence of associated perforation  or abscess. 2. Stable hepatic cyst versus hemangioma. 3. Stable left renal cyst and postoperative changes along the lower pole of the left kidney. Electronically Signed   By: Suzen Dials M.D.   On: 08/31/2023 19:46     Procedures   Medications Ordered in the ED  sodium chloride  0.9 % bolus 1,000 mL (0 mLs Intravenous Stopped 08/31/23 2137)  HYDROmorphone  (DILAUDID ) injection 1 mg (1 mg Intravenous Given 08/31/23 1816)  iohexol  (OMNIPAQUE ) 300 MG/ML solution 100 mL (100 mLs Intravenous Contrast Given 08/31/23 1921)  piperacillin -tazobactam (ZOSYN ) IVPB 3.375 g (0 g Intravenous Stopped 08/31/23 2137)                                    Medical Decision Making Amount and/or Complexity of Data Reviewed External Data Reviewed: notes. Labs: ordered.    Details: Normal WBC Radiology: ordered and independent interpretation performed.    Details: Diverticulitis  Risk Prescription drug management. Decision  regarding hospitalization.   Patient has had continuous diverticulitis symptoms since her admission and discharge about 3 weeks ago.  There was concern for perforation on the CT 2 days ago but today's CT does not show any obvious perforation or abscess.  However she is having poor pain control and continued symptoms, I do not think further outpatient antibiotics are warranted but rather will switch to IV antibiotics and admit.  Discussed with Dr. Alfornia.     Final diagnoses:  Sigmoid diverticulitis    ED Discharge Orders     None          Freddi Hamilton, MD 08/31/23 2155

## 2023-09-01 DIAGNOSIS — K5732 Diverticulitis of large intestine without perforation or abscess without bleeding: Secondary | ICD-10-CM | POA: Diagnosis not present

## 2023-09-01 LAB — GLUCOSE, CAPILLARY
Glucose-Capillary: 102 mg/dL — ABNORMAL HIGH (ref 70–99)
Glucose-Capillary: 102 mg/dL — ABNORMAL HIGH (ref 70–99)
Glucose-Capillary: 105 mg/dL — ABNORMAL HIGH (ref 70–99)
Glucose-Capillary: 139 mg/dL — ABNORMAL HIGH (ref 70–99)
Glucose-Capillary: 95 mg/dL (ref 70–99)
Glucose-Capillary: 97 mg/dL (ref 70–99)
Glucose-Capillary: 97 mg/dL (ref 70–99)

## 2023-09-01 LAB — CBC
HCT: 29.8 % — ABNORMAL LOW (ref 36.0–46.0)
Hemoglobin: 8.9 g/dL — ABNORMAL LOW (ref 12.0–15.0)
MCH: 24.2 pg — ABNORMAL LOW (ref 26.0–34.0)
MCHC: 29.9 g/dL — ABNORMAL LOW (ref 30.0–36.0)
MCV: 81 fL (ref 80.0–100.0)
Platelets: 389 K/uL (ref 150–400)
RBC: 3.68 MIL/uL — ABNORMAL LOW (ref 3.87–5.11)
RDW: 18.7 % — ABNORMAL HIGH (ref 11.5–15.5)
WBC: 4.7 K/uL (ref 4.0–10.5)
nRBC: 0 % (ref 0.0–0.2)

## 2023-09-01 LAB — BASIC METABOLIC PANEL WITH GFR
Anion gap: 8 (ref 5–15)
BUN: 7 mg/dL (ref 6–20)
CO2: 21 mmol/L — ABNORMAL LOW (ref 22–32)
Calcium: 8.4 mg/dL — ABNORMAL LOW (ref 8.9–10.3)
Chloride: 105 mmol/L (ref 98–111)
Creatinine, Ser: 0.71 mg/dL (ref 0.44–1.00)
GFR, Estimated: >60 mL/min (ref >60)
Glucose, Bld: 102 mg/dL — ABNORMAL HIGH (ref 70–99)
Potassium: 3.5 mmol/L (ref 3.5–5.1)
Sodium: 134 mmol/L — ABNORMAL LOW (ref 135–145)

## 2023-09-01 MED ORDER — PANTOPRAZOLE SODIUM 40 MG PO TBEC
40.0000 mg | DELAYED_RELEASE_TABLET | Freq: Two times a day (BID) | ORAL | Status: DC
  Administered 2023-09-01 – 2023-09-03 (×6): 40 mg via ORAL
  Filled 2023-09-01 (×6): qty 1

## 2023-09-01 MED ORDER — ATORVASTATIN CALCIUM 10 MG PO TABS
10.0000 mg | ORAL_TABLET | Freq: Every day | ORAL | Status: DC
  Administered 2023-09-01 – 2023-09-03 (×3): 10 mg via ORAL
  Filled 2023-09-01 (×3): qty 1

## 2023-09-01 MED ORDER — ASPIRIN 81 MG PO TBEC
81.0000 mg | DELAYED_RELEASE_TABLET | Freq: Every day | ORAL | Status: DC
  Administered 2023-09-01 – 2023-09-03 (×3): 81 mg via ORAL
  Filled 2023-09-01 (×3): qty 1

## 2023-09-01 MED ORDER — SODIUM CHLORIDE 0.9 % IV SOLN: INTRAVENOUS | Status: DC

## 2023-09-01 MED ORDER — TRAZODONE HCL 50 MG PO TABS
50.0000 mg | ORAL_TABLET | Freq: Every evening | ORAL | Status: DC | PRN
  Filled 2023-09-01 (×2): qty 1

## 2023-09-01 MED ORDER — METOPROLOL SUCCINATE ER 50 MG PO TB24
50.0000 mg | ORAL_TABLET | Freq: Every day | ORAL | Status: DC
  Administered 2023-09-01 – 2023-09-03 (×3): 50 mg via ORAL
  Filled 2023-09-01 (×3): qty 1

## 2023-09-01 NOTE — Plan of Care (Signed)
   Problem: Education: Goal: Knowledge of General Education information will improve Description: Including pain rating scale, medication(s)/side effects and non-pharmacologic comfort measures Outcome: Progressing   Problem: Health Behavior/Discharge Planning: Goal: Ability to manage health-related needs will improve Outcome: Progressing   Problem: Safety: Goal: Ability to remain free from injury will improve Outcome: Progressing

## 2023-09-01 NOTE — Plan of Care (Signed)
  Problem: Education: Goal: Knowledge of General Education information will improve Description: Including pain rating scale, medication(s)/side effects and non-pharmacologic comfort measures Outcome: Progressing   Problem: Pain Managment: Goal: General experience of comfort will improve and/or be controlled Outcome: Progressing

## 2023-09-01 NOTE — Progress Notes (Signed)
 Mobility Specialist - Progress Note   09/01/23 1406  Mobility  Activity Ambulated independently in hallway  Level of Assistance Independent  Assistive Device None  Distance Ambulated (ft) 500 ft  Activity Response Tolerated well  Mobility Referral Yes  Mobility visit 1 Mobility  Mobility Specialist Start Time (ACUTE ONLY) 1400  Mobility Specialist Stop Time (ACUTE ONLY) 1407  Mobility Specialist Time Calculation (min) (ACUTE ONLY) 7 min   Pt received in bed and agreeable to mobility. No complaints during session. Pt to bed after session with all needs met.    [facility]

## 2023-09-01 NOTE — TOC Initial Note (Signed)
 Transition of Care [facility]) - Initial/Assessment Note    Patient Details  Name: Kaylee Maxwell MRN: [id number] Date of Birth: 1975-09-29  Transition of Care [facility]) CM/SW Contact:    Sonda Manuella Quill, RN Phone Number: 09/01/2023, 2:32 PM  Clinical Narrative:                 [place] w/ pt in room; pt says she lives at home w/ her sons; she plans to return at d/c; she identified POC son Maryan Colt ([phone]); family will provide transportation; pt verified insurance/PCP; she denied SDOH risks; pt says she does not have DME, HH services, or home oxygen; TOC will follow.  Expected Discharge Plan: Home/Self Care Barriers to Discharge: Continued Medical Work up   Patient Goals and CMS Choice Patient states their goals for this hospitalization and ongoing recovery are:: home          Expected Discharge Plan and Services   Discharge Planning Services: CM Consult   Living arrangements for the past 2 months: Single Family Home                                      Prior Living Arrangements/Services Living arrangements for the past 2 months: Single Family Home Lives with:: Adult Children Patient language and need for interpreter reviewed:: Yes Do you feel safe going back to the place where you live?: Yes      Need for Family Participation in Patient Care: Yes (Comment) Care giver support system in place?: Yes (comment) Current home services:  (n/a) Criminal Activity/Legal Involvement Pertinent to Current Situation/Hospitalization: No - Comment as needed  Activities of Daily Living   ADL Screening (condition at time of admission) Independently performs ADLs?: Yes (appropriate for developmental age) Is the patient deaf or have difficulty hearing?: No Does the patient have difficulty seeing, even when wearing glasses/contacts?: No Does the patient have difficulty concentrating, remembering, or making decisions?: No  Permission Sought/Granted Permission sought to  share information with : Case Manager Permission granted to share information with : Yes, Verbal Permission Granted  Share Information with NAME: Case Manager     Permission granted to share info w Relationship: Maryan Colt (son) [phone]     Emotional Assessment Appearance:: Appears stated age Attitude/Demeanor/Rapport: Gracious Affect (typically observed): Accepting Orientation: : Oriented to Self, Oriented to Place, Oriented to  Time, Oriented to Situation Alcohol / Substance Use: Not Applicable Psych Involvement: No (comment)  Admission diagnosis:  Sigmoid diverticulitis [K57.32] Patient Active Problem List   Diagnosis Date Noted   Sigmoid diverticulitis 08/31/2023   Cancer of left kidney (HCC) 08/22/2023   Abscess of sigmoid colon due to diverticulitis 08/08/2023   Generalized abdominal pain 07/21/2023   Dermatitis 07/21/2023   Electrolyte abnormality 07/21/2023   Gastritis and gastroduodenitis 02/02/2023   RUQ pain 12/06/2022   Renal mass 12/06/2022   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   Dilated cbd, acquired 12/05/2022   Gout 10/28/2022   Need for influenza vaccination 10/28/2022   Need for diphtheria-tetanus-pertussis (Tdap) vaccine 05/24/2022   Hyperlipidemia 05/24/2022   Annual physical exam 04/19/2022   Iron deficiency anemia 04/19/2022   Hallux valgus (acquired), left foot 05/14/2021   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   Diarrhea 04/20/2020   Diabetes mellitus (HCC) 04/20/2020   Vaginal itching 07/17/2018   Dysuria 07/17/2018   Primary insomnia 07/16/2018   Excessive bleeding in premenopausal period 05/31/2018   GAD (  generalized anxiety disorder) 03/21/2018   Heart palpitations 03/04/2018   Irregular heartbeat 03/04/2018   Breast cancer screening 03/04/2018   LGSIL on Pap smear of cervix 04/11/2016   Class 2 obesity in adult 12/08/2015   Essential hypertension 12/08/2015   Non-insulin  dependent type 2 diabetes mellitus (HCC) 12/08/2015    Palpitations 12/08/2015   Scalp mass 10/09/2012   PCP:  Paseda, [name], FNP Pharmacy:   [facility] - [facility] [address] [place] [address] Phone: [phone] Fax: [phone]     Social Drivers of Health (SDOH) Social History: SDOH Screenings   Food Insecurity: No Food Insecurity (09/01/2023)  Housing: Low Risk  (09/01/2023)  Transportation Needs: No Transportation Needs (09/01/2023)  Utilities: Not At Risk (09/01/2023)  Alcohol Screen: Low Risk  (01/04/2021)  Depression (PHQ2-9): Low Risk  (07/21/2023)  Financial Resource Strain: High Risk (03/21/2018)  Physical Activity: Inactive (03/21/2018)  Social Connections: Moderately Integrated (03/21/2018)  Stress: Stress Concern Present (03/21/2018)  Tobacco Use: Medium Risk (08/31/2023)   SDOH Interventions: Food Insecurity Interventions: Intervention Not Indicated, Inpatient TOC Housing Interventions: Intervention Not Indicated, Inpatient TOC Transportation Interventions: Intervention Not Indicated, Inpatient TOC Utilities Interventions: Intervention Not Indicated, Inpatient TOC   Readmission Risk Interventions    08/09/2023   10:53 AM  Readmission Risk Prevention Plan  Post Dischage Appt Complete  Medication Screening Complete  Transportation Screening Complete

## 2023-09-01 NOTE — Consult Note (Addendum)
 Referring Provider: Dr. Trenda Mar  Primary Care Physician:  Paseda, [name], FNP Primary Gastroenterologist:  Dr. Albertus  Reason for Consultation: Persistent diverticulitis  HPI: Kaylee Maxwell is a 48 y.o. female with a past medical history of anxiety, hypertension, PVCs, diabetes mellitus type 2, IDA, left renal cancer s/p partial nephrectomy 06/2023 and diverticulitis.   She was recently admitted to the hospital 08/08/2023 with persistent lower abdominal pain which started after she underwent a partial left nephrectomy secondary to a left kidney mass. Noncontrast CT by urology that showed distal sigmoid colon diverticulitis with a 2.4 x 2.3 cm abscess and she was sent to the ED for further evaluation. She received IV Zosyn  then switched to Cipro /Flagyl  and completed a 7-day course. General surgery was consulted who assessed her abscess was not amenable for IR drainage. Her clinical status remained stable and she was discharged home 08/10/2023 with instructions to follow-up with GI to schedule a colonoscopy in 6 weeks.   She was scheduled for an appointment in our outpatient GI clinic with Lauraine Pa, PA-C on 10/03/2023.  She was seen by hematologist Dr. Lonn 08/22/2023 for follow-up regarding renal cancer and IDA and the patient noted having persistent lower abdominal pain despite taking Cipro /Flagyl  as prescribed at discharge as noted above.  A repeat CTAP with and without contrast was done 08/29/2023 which showed postsurgical change to the left lower pole area of partial nephrectomy with a 4.1 x 3 cm collection in the surgical bed possibly representing residual renal parenchyma (recurrent disease could not be excluded), no evidence of metastatic disease and there was evidence of persistent sigmoid diverticulitis with fluid in the pelvis/sigmoid mesentery and inflamed colonic diverticula with a small focus of gas concerning for a perforation. 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 Due to concerns of persistent diverticulitis with a possible perforation she was sent to the ED for further evaluation.  Labs in the ED showed a WBC count of 5.8.  Hemoglobin 10.2 ( Hg 9.5 on 08/22/2023).  MCV 80.3.  Platelets 480.  BUN 8.  Creatinine 0.86.  Normal LFTs.  hCG negative.  Repeat CTAP with contrast in the ED showed persistent sigmoid diverticulitis without evidence of perforation or abscess, stable hepatic cyst versus hemangioma with postoperative changes along.  Labs today: WBC 4.7.  Hemoglobin 8.9.  HCT 29.8.  Platelets 389.  BUN 7.  Creatinine 0.71.  She was started on IV fluids and Zosyn  IV.  A GI consult was requested for further evaluation regarding persistent diverticulitis.  No prior history of diverticulitis until 08/08/2023. She stated her lower abdominal pain never abated when she was discharged home on Cipro  and Flagyl  x 7 days. She stated having persistent pain throughout her lower abdomen, sometimes worse to the RLQ area.  She took Oxycodone  3-4 times daily mild constipation for few days which resolved after taking a stool softener.  No bloody or black stools.  Her stools are green since starting oral iron which she started taking 1 or 2 months ago. She denies ever having a colonoscopy.  No known family history of IBD or colorectal cancer.  Paternal aunt with history of gallbladder cancer.  No nausea or vomiting.  No GERD symptoms. She takes ASA 81 mg daily.  No other NSAID use.  Non-smoker.  Rare alcohol consumption.  She was previously seen by our inpatient GI service 12/06/2022 during a hospital admission due to having right-sided abdominal pain.  CT and MRI/MRCP imaging showed mild biliary ductal dilatation and a 6mm lesion in the pancreatic head adjacent to the ampulla, likely IPMN. She underwent an outpatient EUS by Dr. Wilhelmenia 02/02/2023 which showed dilatation to the CBD and in the common hepatic duct without  choledocholithiasis or biliary stricture, the main pancreatic duct was normal, no ampullary lesion and a single parenchymal hyperechoic foci present in the pancreatic head. A repeat MRI/MRCP in 1 to 2 years was recommended. Imaging during that hospitalization also identified a left renal mass which resulted in a biopsy and diagnosis of left kidney cancer status post partial nephrectomy.  GI PROCEDURES:  Endoscopic ultrasound 02/02/2023:  EGD impression:  - No gross lesions in the entire esophagus. Z-line irregular, 37 cm from the incisors.  - 2 cm hiatal hernia.  - Erythematous mucosa and erosions in the distal gastric body, antrum and prepyloric region of the stomach. No other gross lesions in the entire stomach. Biopsied.  - Erythematous duodenopathy noted in the bulb/sweep/D2. Biopsied.  - Non-bleeding duodenal diverticulum found suprapapillary. Normal major papilla.  A. STOMACH, BIOPSY:  Mild reactive gastropathy with minimal chronic gastritis  Negative for H. pylori, intestinal metaplasia, dysplasia and carcinoma   B. DUODENUM, BIOPSY:  Benign duodenal mucosa with no diagnostic abnormality    EUS impression:  - There was dilation in the common bile duct and in the common hepatic duct. No evidence of choledocholithiasis or biliary stricture noted. Regular contour was noted throughout.  - There was no sign of significant pathology in the gallbladder (i.e. no evidence of cholelithiasis/microlithiasis).  - A single pancreatic parenchymal hyperechoic foci was noted in the pancreatic head. The rest of the pancreatic parenchyma consisted of lobularity without honeycombing throughout the entire pancreas.  - Main pancreatic duct (MPD) diameter was measured. Endosonographically, the MPD had a normal appearance.  - No intramural ampullary lesion noted.  - No malignant-appearing lymph nodes were visualized in the celiac region (level 20), peripancreatic region and porta hepatis region. Recommend  repeat MRI/MRCP in 1 to 2 years with primary gastroenterologist t  Past Medical History:  Diagnosis Date   Anxiety 03/04/2018   Breast cancer screening 03/04/2018   Heart palpitations 03/04/2018   Monitor 9/21: normal sinus rhythm, sinus tachycardia, Avg HR 110; occ PVCs   Hypertension    Iron deficiency    Irregular heartbeat 03/04/2018   Mild mitral regurgitation    Obesity    Osteochondroma    PVC's (premature ventricular contractions)    Scalp mass 10/09/2012   Sinus tachycardia    Type 2 diabetes mellitus [facility])     Past Surgical History:  Procedure Laterality Date   ANKLE FRACTURE SURGERY Right    BIOPSY  02/02/2023   Procedure: BIOPSY;  Surgeon: Wilhelmenia Aloha Raddle., MD;  Location: [facility];  Service: Gastroenterology;;   ROMAYNE Right    cyst removal from scalp     ESOPHAGOGASTRODUODENOSCOPY N/A 02/02/2023   Procedure: ESOPHAGOGASTRODUODENOSCOPY (EGD);  Surgeon: Wilhelmenia Aloha Raddle., MD;  Location: [facility];  Service: Gastroenterology;  Laterality: N/A;   EUS N/A 02/02/2023   Procedure: UPPER ENDOSCOPIC ULTRASOUND (EUS) RADIAL;  Surgeon: Wilhelmenia Aloha Raddle., MD;  Location: [facility];  Service: Gastroenterology;  Laterality: N/A;   left leg bone removal     ROBOTIC ASSITED PARTIAL NEPHRECTOMY Left 07/04/2023   Procedure: XI ROBOTIC ASSITED LEFT  PARTIAL NEPHRECTOMY;  Surgeon: Devere Lonni Righter, MD;  Location: [facility];  Service: Urology;  Laterality: Left;  180 MINUTES    Prior to  Admission medications   Medication Sig Start Date End Date Taking? Authorizing Provider  aspirin  EC 81 MG tablet Take 81 mg by mouth in the morning. Swallow whole.   Yes [provider]  atorvastatin  (LIPITOR) 10 MG tablet Take 1 tablet (10 mg total) by mouth daily. 06/06/23  Yes Paseda, [name], FNP  ferrous sulfate  325 (65 FE) MG EC tablet Take 1 tablet (325 mg total) by mouth daily. 06/13/23  Yes Paseda, [name], FNP  metFORMIN  (GLUCOPHAGE -XR)  500 MG 24 hr tablet Take 500 mg with breakfast daily by mouth  for one week , then increase to 500 mg by mouth twice daily with meal Patient taking differently: Take 500 mg by mouth 2 (two) times daily with a meal. 06/07/23  Yes Paseda, [name], FNP  metoprolol  succinate (TOPROL -XL) 50 MG 24 hr tablet Take 1 tablet (50 mg total) by mouth daily. Take with or immediately following a meal. Please schedule follow up appt for more refills Patient taking differently: Take 50 mg by mouth daily. 08/01/23  Yes Nahser, Aleene PARAS, MD  ondansetron  (ZOFRAN -ODT) 4 MG disintegrating tablet Take 4 mg by mouth daily as needed for nausea or vomiting.   Yes [provider]  oxyCODONE  (OXY IR/ROXICODONE ) 5 MG immediate release tablet Take 1 tablet (5 mg total) by mouth every 4 (four) hours as needed for severe pain (pain score 7-10). Patient taking differently: Take 5 mg by mouth every 6 (six) hours as needed for severe pain (pain score 7-10) or moderate pain (pain score 4-6). 08/22/23  Yes Gorsuch, Almarie, MD  pantoprazole  (PROTONIX ) 40 MG tablet Take 40 mg by mouth 2 (two) times daily.   Yes [provider]  traZODone  (DESYREL ) 50 MG tablet Take 1 tablet (50 mg total) by mouth at bedtime as needed for sleep 05/03/23  Yes Paseda, [name], FNP  TYLENOL  8 HOUR ARTHRITIS PAIN 650 MG CR tablet Take 650 mg by mouth 2 (two) times daily as needed for pain.   Yes [provider]  ciprofloxacin  (CIPRO ) 500 MG tablet Take 500 mg by mouth 2 (two) times daily. Patient not taking: Reported on 09/01/2023    [provider]  glucose blood (GNP TRUE METRIX GLUCOSE STRIPS) test strip Use as instructed 04/19/22   Paseda, [name], FNP  Lancets (FREESTYLE) lancets Use as directed 04/19/22   Paseda, [name], FNP  metroNIDAZOLE  (FLAGYL ) 500 MG tablet Take 500 mg by mouth 2 (two) times daily. Patient not taking: Reported on 09/01/2023    [provider]  spironolactone  (ALDACTONE ) 25 MG tablet  Take by mouth daily. Patient not taking: Reported on 09/01/2023    [provider]  triamcinolone  cream (KENALOG ) 0.1 % Apply 1 Application topically 2 (two) times daily. Patient not taking: Reported on 09/01/2023 07/21/23   Paseda, [name], FNP  valsartan  (DIOVAN ) 80 MG tablet Take 80 mg by mouth daily. Patient not taking: Reported on 09/01/2023    [provider]  cetirizine  (ZYRTEC  ALLERGY) 10 MG tablet Take 1 tablet (10 mg total) by mouth daily. 03/04/20 04/20/20  Christopher Savannah, PA-C    Current Facility-Administered Medications  Medication Dose Route Frequency Provider Last Rate Last Admin   0.9 %  sodium chloride  infusion   Intravenous Continuous Alfornia Madison, MD 125 mL/hr at 08/31/23 2325 New Bag at 08/31/23 2325   acetaminophen  (TYLENOL ) tablet 650 mg  650 mg Oral Q6H PRN Alfornia Madison, MD       Or   acetaminophen  (  TYLENOL ) suppository 650 mg  650 mg Rectal Q6H PRN Alfornia Madison, MD       HYDROmorphone  (DILAUDID ) injection 1 mg  1 mg Intravenous Q4H PRN Rathore, Vasundhra, MD   1 mg at 09/01/23 9251   insulin  aspart (novoLOG ) injection 0-9 Units  0-9 Units Subcutaneous Q4H Alfornia Madison, MD       naloxone  (NARCAN ) injection 0.4 mg  0.4 mg Intravenous PRN Alfornia Madison, MD       ondansetron  (ZOFRAN -ODT) disintegrating tablet 4 mg  4 mg Oral Q8H PRN Alfornia Madison, MD       oxyCODONE  (Oxy IR/ROXICODONE ) immediate release tablet 5 mg  5 mg Oral Q6H PRN Alfornia Madison, MD       piperacillin -tazobactam (ZOSYN ) IVPB 3.375 g  3.375 g Intravenous Q8H Rathore, Vasundhra, MD 12.5 mL/hr at 09/01/23 0347 3.375 g at 09/01/23 0347    Allergies as of 08/31/2023 - Review Complete 08/31/2023  Allergen Reaction Noted   Ozempic  (0.25 or 0.5 mg-dose) [semaglutide (0.25 or 0.5mg -dos)] Other (See Comments) 06/06/2023    Family History  Problem Relation Age of Onset   Diabetes Mother    Hypertension Mother    Obesity Mother    Hypertension Father     Cancer Paternal Aunt        pancreatic   Cancer Maternal Grandmother        lung   Autism spectrum disorder Sister     Social History   Socioeconomic History   Marital status: Single    Spouse name: Not on file   Number of children: 2   Years of education: Not on file   Highest education level: Associate degree: academic program  Occupational History   Not on file  Tobacco Use   Smoking status: Former    Current packs/day: 0.50    Average packs/day: 0.5 packs/day for 18.0 years (9.0 ttl pk-yrs)    Types: Cigarettes    Passive exposure: Never   Smokeless tobacco: Never  Vaping Use   Vaping status: Every Day   Substances: Nicotine , Flavoring  Substance and Sexual Activity   Alcohol use: Yes    Comment: occasional   Drug use: No   Sexual activity: Yes    Birth control/protection: None  Other Topics Concern   Not on file  Social History Narrative   Lives with her sons.    Social Drivers of Corporate investment banker Strain: High Risk (03/21/2018)   Overall Financial Resource Strain (CARDIA)    Difficulty of Paying Living Expenses: Hard  Food Insecurity: No Food Insecurity (08/31/2023)   Hunger Vital Sign    Worried About Running Out of Food in the Last Year: Never true    Ran Out of Food in the Last Year: Never true  Transportation Needs: Unknown (08/31/2023)   PRAPARE - Transportation    Lack of Transportation (Medical): No    Lack of Transportation (Non-Medical): Patient declined  Physical Activity: Inactive (03/21/2018)   Exercise Vital Sign    Days of Exercise per Week: 0 days    Minutes of Exercise per Session: 0 min  Stress: Stress Concern Present (03/21/2018)   [employer] of Occupational Health - Occupational Stress Questionnaire    Feeling of Stress : Rather much  Social Connections: Moderately Integrated (03/21/2018)   Social Connection and Isolation Panel    Frequency of Communication with Friends and Family: Three times a week    Frequency of  Social Gatherings with Friends and Family: Twice a week  Attends Religious Services: More than 4 times per year    Active Member of Clubs or Organizations: Yes    Attends Banker Meetings: Never    Marital Status: Never married  Intimate Partner Violence: Not At Risk (08/31/2023)   Humiliation, Afraid, Rape, and Kick questionnaire    Fear of Current or Ex-Partner: No    Emotionally Abused: No    Physically Abused: No    Sexually Abused: No    Review of Systems: Gen: Denies fever, sweats or chills. No weight loss.  CV: Denies chest pain, palpitations or edema. Resp: Denies cough, shortness of breath of hemoptysis.  GI: See HPI.   GU : Denies urinary burning, blood in urine, increased urinary frequency or incontinence. MS: Denies joint pain, muscles aches or weakness. Derm: Denies rash, itchiness, skin lesions or unhealing ulcers. Psych: Denies depression, anxiety, memory loss or confusion. Heme: Denies easy bruising, bleeding. Neuro:  Denies headaches, dizziness or paresthesias. Endo: + DM type II.  Physical Exam: Vital signs in last 24 hours: Temp:  [97.9 F (36.6 C)-98.9 F (37.2 C)] 97.9 F (36.6 C) (07/25 0144) Pulse Rate:  [60-80] 60 (07/25 0144) Resp:  [15-18] 15 (07/25 0144) BP: (125-156)/(58-75) 134/58 (07/25 0144) SpO2:  [100 %] 100 % (07/25 0144) Weight:  [91.2 kg] 91.2 kg (07/24 2223) Last BM Date : 08/31/23 General:  Alert 48 year old obese female in no acute distress.  Anxious appearing. Head:  Normocephalic and atraumatic. Eyes:  No scleral icterus. Conjunctiva pink. Ears:  Normal auditory acuity. Nose:  No deformity, discharge or lesions. Mouth:  Dentition intact. No ulcers or lesions.  Neck:  Supple. No lymphadenopathy or thyromegaly.  Lungs: Breath sounds clear throughout. No wheezes, rhonchi or crackles.  Heart: Rate and rhythm, no murmurs. Abdomen: Soft, nondistended.  Moderate tenderness throughout the lower abdomen without rebound or  guarding.  Positive bowel sounds to all 4 quadrants. Rectal: Deferred. Musculoskeletal: Symmetrical without gross deformities.  Pulses:  Normal pulses noted. Extremities: Without clubbing or edema. Neurologic:  Alert and  oriented x 4. No focal deficits.  Skin:  Intact without significant lesions or rashes. Psych: Alert and cooperative. Normal mood and affect.  Intake/Output from previous day: 07/24 0701 - 07/25 0700 In: 797.1 [I.V.:772.6; IV Piggyback:24.5] Out: 300 [Urine:300] Intake/Output this shift: No intake/output data recorded.  Lab Results: Recent Labs    08/31/23 1115 09/01/23 0424  WBC 5.8 4.7  HGB 10.2* 8.9*  HCT 35.5* 29.8*  PLT 480* 389   BMET Recent Labs    08/31/23 1115 09/01/23 0424  NA 138 134*  K 4.0 3.5  CL 105 105  CO2 22 21*  GLUCOSE 136* 102*  BUN 8 7  CREATININE 0.86 0.71  CALCIUM  9.6 8.4*   LFT Recent Labs    08/31/23 1115  PROT 8.6*  ALBUMIN 3.8  AST 19  ALT 14  ALKPHOS 61  BILITOT 0.6   PT/INR No results for input(s): LABPROT, INR in the last 72 hours. Hepatitis Panel No results for input(s): HEPBSAG, HCVAB, HEPAIGM, HEPBIGM in the last 72 hours.  Studies/Results: CT ABDOMEN PELVIS W CONTRAST Result Date: 08/31/2023 CLINICAL DATA:  Suspected diverticulitis. EXAM: CT ABDOMEN AND PELVIS WITH CONTRAST TECHNIQUE: Multidetector CT imaging of the abdomen and pelvis was performed using the standard protocol following bolus administration of intravenous contrast. RADIATION DOSE REDUCTION: This exam was performed according to the departmental dose-optimization program which includes automated exposure control, adjustment of the mA and/or kV according to patient size and/or use  of iterative reconstruction technique. CONTRAST:  OMNIPAQUE  IOHEXOL  300 MG/ML  SOLN COMPARISON:  August 29, 2023 FINDINGS: Lower chest: No acute abnormality. Hepatobiliary: A stable hepatic cyst versus hemangioma is seen within the right lobe of the  liver. No gallstones, gallbladder wall thickening, or biliary dilatation. Pancreas: Unremarkable. No pancreatic ductal dilatation or surrounding inflammatory changes. Spleen: Normal in size without focal abnormality. Adrenals/Urinary Tract: Adrenal glands are unremarkable. Kidneys are normal in size, without renal calculi or hydronephrosis. A stable renal cyst is seen along the posterior aspect of the lower pole of the left kidney. Postoperative changes within this region are also seen. Bladder is unremarkable. Stomach/Bowel: Stomach is within normal limits. Appendix appears normal. No evidence of bowel dilatation persistent moderately inflamed diverticula are seen within the mid sigmoid colon. There is no evidence of associated perforation or abscess noninflamed diverticula are noted throughout the remainder of the large bowel. Vascular/Lymphatic: No significant vascular findings are present. A retroaortic left renal vein is seen. No enlarged abdominal or pelvic lymph nodes. Reproductive: Status post hysterectomy. No adnexal masses. Other: No abdominal wall hernia or abnormality. No abdominopelvic ascites. Musculoskeletal: Left iliac bone and chondromas are noted. No acute osseous abnormalities are identified IMPRESSION: 1. Persistent sigmoid diverticulitis, without evidence of associated perforation or abscess. 2. Stable hepatic cyst versus hemangioma. 3. Stable left renal cyst and postoperative changes along the lower pole of the left kidney. Electronically Signed   By: Suzen Dials M.D.   On: 08/31/2023 19:46    IMPRESSION/PLAN:  48 year old female previously admitted to the hospital 7/1 - 08/10/2023 with sigmoid diverticulitis with abscess treated with IV Zosyn  then Cipro  and Flagyl  x 7 days. Readmitted 7/24 with persistent sigmoid diverticulitis with a possible perforation. Outpatient noncontrast CT 08/29/2023 showed persistent sigmoid diverticulitis with possible perforation.  Repeat CTAP with contrast  7/24 showed sigmoid diverticulitis without evidence of perforation or abscess.  WBC 5.8.  Afebrile.  Hemodynamically stable. - NPO - Continue IV fluid - Pain management per the hospitalist - Continue Zosyn  IV every 8 hours - MiraLAX  nightly as needed to avoid constipation - Eventual colonoscopy as an outpatient in 6 weeks  Left kidney cancer status post partial nephrectomy 06/2023  Iron deficiency anemia. On oral iron. No GI bleeding. - CBC, iron, TIBC and ferritin in a.m. - Continue outpatient follow-up with hematology  History of CBD and common hepatic duct dilatation and a 6 mm lesion in the pancreatic head per  EUS 01/2023, likely IPMN.  CTAP 08/29/2023 showed a stable 5 mm cystic lesion in the pancreatic head/uncinate process. CTAP with contrast 09/01/2023 showed a normal pancreas without PD dilatation. - Repeat abdominal MRI/MRCP 01/2024   Kaylee Maxwell  09/01/2023, 9:55 AM

## 2023-09-01 NOTE — Progress Notes (Signed)
 PROGRESS NOTE   Kaylee Maxwell  FMW:[id number]    DOB: 1975-07-02    DOA: 08/31/2023  PCP: Paseda, [name], FNP   I have briefly reviewed patients previous medical records in [facility].   Brief Hospital Course:  48 year old female with PMH of HTN, HLD, type II DM, renal mass/RCC s/p left partial nephrectomy 07/04/2023, iron deficiency anemia, palpitations/PVCs, gastritis/duodenitis, anxiety, hospitalized 7/1 - 7/3 for acute diverticulitis with small abscess not amenable to IR drainage, treated with IV Zosyn  while hospitalized, completed a 7-day course of Cipro /Flagyl  on discharge, ongoing chronic abdominal pain, nausea, intermittent vomiting, seen by oncology in office on 7/15 and had a repeat CT A/P with IV contrast done on 7/22 that showed persistent sigmoid colonic diverticulitis with fluid in the pelvis/sigmoid mesentery and inflamed colonic diverticulum, small focus of gas in the sigmoid mesentery measuring 18 mm in maximum dimension concerning for perforation.  Presented to ED and admitted for persistent mild diverticulitis.  Repeat CT A/P with contrast 7/24 showed persistent diverticulitis but without evidence of perforation or abscess.  [facility] consulted.    Assessment & Plan:   Persistent acute diverticulitis Appears to have refractory/persistent symptoms since end of June 2025, despite appropriate treatment for acute diverticulitis including IV antibiotics followed by p.o. antibiotics. CT renal stone study on 7/1 (do not see this result in [facility] or Care Everywhere) as per DC summary from 7/3, showed distal sigmoid colon diverticulitis with small 2.4 x 2.3 cm abscess As stated above, completed a course of IV Zosyn  hospitalized followed by a week of Cipro /Flagyl . Repeat CT A/P with IV contrast only on 7/22 showed persistent sigmoid colonic diverticulitis with concern for perforation. Repeat CT A/P with contrast on 7/24 however showed persistent diverticulitis but without  evidence of perforation or abscess. Do not see a role for surgical intervention or endoscopies currently.  Patient may just need another course of IV antibiotics followed by oral antibiotics, supportive care and outpatient follow-up for interim colonoscopy. Continue supportive care, bowel rest, IV fluids, multimodality pain control, as needed IV antiemetics, IV Zosyn . Probably treat with IV antibiotics for 48 hours and pending improvement, DC home on Augmentin. [facility] consulted. Has appointment to see Lauraine Furbish, [facility] PA on 8/26.  Type II DM A1c 7.1 on 06/06/2023.  Hold metformin  Controlled on sensitive SSI.  Monitor.  Iron deficiency anemia Hemoglobin appears to be able at prior baseline.  Transiently elevated hemoglobin on admission likely due to hemoconcentration. Follow CBCs daily  Essential hypertension Mildly uncontrolled at times Resumed home dose of Toprol -XL 50 Mg daily.  Patient is not on ARB  Hyperlipidemia Continue home dose of statins  History of gastritis/duodenitis Continue twice daily PPI  Anxiety Not on meds PTA as per MAR.  RCC s/p left partial nephrectomy Management per outpatient oncology.   Body mass index is 33.45 kg/m./Obesity class I Complicates care.  Outpatient follow-up.   DVT prophylaxis: SCDs Start: 08/31/23 2314     Code Status: Full Code:  Family Communication: Son sleeping at bedside. Disposition:  Status is: Observation The patient will require care spanning > 2 midnights and should be moved to inpatient because: IV antibiotics, bowel rest etc.     Consultants:   [facility]  Procedures:     Subjective:  History mostly as noted above.  Reports ongoing abdominal pain since hospital discharge, 7/10 in intensity, intermittent, no particular aggravating factors except when she had a sandwich.  Intermittent nausea and occasional vomiting but no coffee-ground  emesis or blood.  Soft/mushy stools with occasional streaks of blood.   Has been taking up to 4 times daily Oxy IR.  Never had a colonoscopy thus far.  Feels somewhat better compared to admission in terms of her nausea but not her abdominal pain.  Objective:   Vitals:   08/31/23 2205 08/31/23 2223 09/01/23 0144 09/01/23 0940  BP: (!) 156/75  (!) 134/58 (!) 143/56  Pulse: 60  60 (!) 52  Resp: 17  15 18   Temp: 98.3 F (36.8 C)  97.9 F (36.6 C) 97.9 F (36.6 C)  TempSrc: Oral  Oral Oral  SpO2: 100%  100% 100%  Weight:  91.2 kg    Height:  5' 5 (1.651 m)      General exam: Young female, moderately built and obese lying propped up in bed, somewhat uncomfortable probably due to pain. Respiratory system: Clear to auscultation. Respiratory effort normal. Cardiovascular system: S1 & S2 heard, RRR. No JVD, murmurs, rubs, gallops or clicks. No pedal edema.  Telemetry personally reviewed: Sinus rhythm. Gastrointestinal system: Abdomen is nondistended, soft.  Some tenderness mostly in the left middle and left lower quadrant without peritoneal signs. No organomegaly or masses felt. Normal bowel sounds heard. Central nervous system: Alert and oriented. No focal neurological deficits. Extremities: Symmetric 5 x 5 power. Skin: No rashes, lesions or ulcers Psychiatry: Judgement and insight appear normal. Mood & affect appropriate.     Data Reviewed:   I have personally reviewed following labs and imaging studies   CBC: Recent Labs  Lab 08/31/23 1115 09/01/23 0424  WBC 5.8 4.7  HGB 10.2* 8.9*  HCT 35.5* 29.8*  MCV 80.3 81.0  PLT 480* 389    Basic Metabolic Panel: Recent Labs  Lab 08/31/23 1115 09/01/23 0424  NA 138 134*  K 4.0 3.5  CL 105 105  CO2 22 21*  GLUCOSE 136* 102*  BUN 8 7  CREATININE 0.86 0.71  CALCIUM  9.6 8.4*    Liver Function Tests: Recent Labs  Lab 08/31/23 1115  AST 19  ALT 14  ALKPHOS 61  BILITOT 0.6  PROT 8.6*  ALBUMIN 3.8    CBG: Recent Labs  Lab 08/31/23 2339 09/01/23 0353 09/01/23 0734  GLUCAP 95 105* 102*     Microbiology Studies:  No results found for this or any previous visit (from the past 240 hours).  Radiology Studies:  CT ABDOMEN PELVIS W CONTRAST Result Date: 08/31/2023 CLINICAL DATA:  Suspected diverticulitis. EXAM: CT ABDOMEN AND PELVIS WITH CONTRAST TECHNIQUE: Multidetector CT imaging of the abdomen and pelvis was performed using the standard protocol following bolus administration of intravenous contrast. RADIATION DOSE REDUCTION: This exam was performed according to the departmental dose-optimization program which includes automated exposure control, adjustment of the mA and/or kV according to patient size and/or use of iterative reconstruction technique. CONTRAST:  OMNIPAQUE  IOHEXOL  300 MG/ML  SOLN COMPARISON:  August 29, 2023 FINDINGS: Lower chest: No acute abnormality. Hepatobiliary: A stable hepatic cyst versus hemangioma is seen within the right lobe of the liver. No gallstones, gallbladder wall thickening, or biliary dilatation. Pancreas: Unremarkable. No pancreatic ductal dilatation or surrounding inflammatory changes. Spleen: Normal in size without focal abnormality. Adrenals/Urinary Tract: Adrenal glands are unremarkable. Kidneys are normal in size, without renal calculi or hydronephrosis. A stable renal cyst is seen along the posterior aspect of the lower pole of the left kidney. Postoperative changes within this region are also seen. Bladder is unremarkable. Stomach/Bowel: Stomach is within normal limits. Appendix appears normal.  No evidence of bowel dilatation persistent moderately inflamed diverticula are seen within the mid sigmoid colon. There is no evidence of associated perforation or abscess noninflamed diverticula are noted throughout the remainder of the large bowel. Vascular/Lymphatic: No significant vascular findings are present. A retroaortic left renal vein is seen. No enlarged abdominal or pelvic lymph nodes. Reproductive: Status post hysterectomy. No adnexal masses.  Other: No abdominal wall hernia or abnormality. No abdominopelvic ascites. Musculoskeletal: Left iliac bone and chondromas are noted. No acute osseous abnormalities are identified IMPRESSION: 1. Persistent sigmoid diverticulitis, without evidence of associated perforation or abscess. 2. Stable hepatic cyst versus hemangioma. 3. Stable left renal cyst and postoperative changes along the lower pole of the left kidney. Electronically Signed   By: Suzen Dials M.D.   On: 08/31/2023 19:46    Scheduled Meds:    insulin  aspart  0-9 Units Subcutaneous Q4H    Continuous Infusions:    sodium chloride  125 mL/hr at 08/31/23 2325   piperacillin -tazobactam (ZOSYN )  IV 3.375 g (09/01/23 0347)     LOS: 0 days     Trenda Mar, MD,  FACP, [facility], [facility], [facility]   [facility] [place]      To contact the attending provider between 7A-7P or the covering provider during after hours 7P-7A, please log into the web site [email or web address] and access using universal [place] password for that web site. If you do not have the password, please call the hospital operator.  09/01/2023, 10:11 AM

## 2023-09-02 DIAGNOSIS — Z800 Family history of malignant neoplasm of digestive organs: Secondary | ICD-10-CM | POA: Diagnosis not present

## 2023-09-02 DIAGNOSIS — D509 Iron deficiency anemia, unspecified: Secondary | ICD-10-CM | POA: Diagnosis present

## 2023-09-02 DIAGNOSIS — E876 Hypokalemia: Secondary | ICD-10-CM

## 2023-09-02 DIAGNOSIS — C642 Malignant neoplasm of left kidney, except renal pelvis: Secondary | ICD-10-CM | POA: Diagnosis present

## 2023-09-02 DIAGNOSIS — Z7982 Long term (current) use of aspirin: Secondary | ICD-10-CM | POA: Diagnosis not present

## 2023-09-02 DIAGNOSIS — I10 Essential (primary) hypertension: Secondary | ICD-10-CM | POA: Diagnosis present

## 2023-09-02 DIAGNOSIS — F17290 Nicotine dependence, other tobacco product, uncomplicated: Secondary | ICD-10-CM | POA: Diagnosis present

## 2023-09-02 DIAGNOSIS — F419 Anxiety disorder, unspecified: Secondary | ICD-10-CM | POA: Diagnosis present

## 2023-09-02 DIAGNOSIS — E66811 Obesity, class 1: Secondary | ICD-10-CM | POA: Diagnosis present

## 2023-09-02 DIAGNOSIS — Z79899 Other long term (current) drug therapy: Secondary | ICD-10-CM | POA: Diagnosis not present

## 2023-09-02 DIAGNOSIS — K5732 Diverticulitis of large intestine without perforation or abscess without bleeding: Secondary | ICD-10-CM | POA: Diagnosis present

## 2023-09-02 DIAGNOSIS — Z8249 Family history of ischemic heart disease and other diseases of the circulatory system: Secondary | ICD-10-CM | POA: Diagnosis not present

## 2023-09-02 DIAGNOSIS — Z905 Acquired absence of kidney: Secondary | ICD-10-CM | POA: Diagnosis not present

## 2023-09-02 DIAGNOSIS — E119 Type 2 diabetes mellitus without complications: Secondary | ICD-10-CM | POA: Diagnosis present

## 2023-09-02 DIAGNOSIS — Z6833 Body mass index (BMI) 33.0-33.9, adult: Secondary | ICD-10-CM | POA: Diagnosis not present

## 2023-09-02 DIAGNOSIS — E785 Hyperlipidemia, unspecified: Secondary | ICD-10-CM | POA: Diagnosis present

## 2023-09-02 DIAGNOSIS — Z888 Allergy status to other drugs, medicaments and biological substances status: Secondary | ICD-10-CM | POA: Diagnosis not present

## 2023-09-02 DIAGNOSIS — Z7984 Long term (current) use of oral hypoglycemic drugs: Secondary | ICD-10-CM | POA: Diagnosis not present

## 2023-09-02 DIAGNOSIS — F17210 Nicotine dependence, cigarettes, uncomplicated: Secondary | ICD-10-CM | POA: Diagnosis present

## 2023-09-02 DIAGNOSIS — Z833 Family history of diabetes mellitus: Secondary | ICD-10-CM | POA: Diagnosis not present

## 2023-09-02 LAB — BASIC METABOLIC PANEL WITH GFR
Anion gap: 8 (ref 5–15)
BUN: 5 mg/dL — ABNORMAL LOW (ref 6–20)
CO2: 23 mmol/L (ref 22–32)
Calcium: 8.7 mg/dL — ABNORMAL LOW (ref 8.9–10.3)
Chloride: 105 mmol/L (ref 98–111)
Creatinine, Ser: 0.74 mg/dL (ref 0.44–1.00)
GFR, Estimated: >60 mL/min (ref >60)
Glucose, Bld: 100 mg/dL — ABNORMAL HIGH (ref 70–99)
Potassium: 3.3 mmol/L — ABNORMAL LOW (ref 3.5–5.1)
Sodium: 136 mmol/L (ref 135–145)

## 2023-09-02 LAB — CBC
HCT: 29.1 % — ABNORMAL LOW (ref 36.0–46.0)
Hemoglobin: 8.6 g/dL — ABNORMAL LOW (ref 12.0–15.0)
MCH: 23.8 pg — ABNORMAL LOW (ref 26.0–34.0)
MCHC: 29.6 g/dL — ABNORMAL LOW (ref 30.0–36.0)
MCV: 80.4 fL (ref 80.0–100.0)
Platelets: 371 K/uL (ref 150–400)
RBC: 3.62 MIL/uL — ABNORMAL LOW (ref 3.87–5.11)
RDW: 18.4 % — ABNORMAL HIGH (ref 11.5–15.5)
WBC: 3.5 K/uL — ABNORMAL LOW (ref 4.0–10.5)
nRBC: 0 % (ref 0.0–0.2)

## 2023-09-02 LAB — GLUCOSE, CAPILLARY
Glucose-Capillary: 100 mg/dL — ABNORMAL HIGH (ref 70–99)
Glucose-Capillary: 107 mg/dL — ABNORMAL HIGH (ref 70–99)
Glucose-Capillary: 99 mg/dL (ref 70–99)
Glucose-Capillary: 142 mg/dL — ABNORMAL HIGH (ref 70–99)
Glucose-Capillary: 115 mg/dL — ABNORMAL HIGH (ref 70–99)

## 2023-09-02 MED ORDER — INSULIN ASPART 100 UNIT/ML IJ SOLN
0.0000 [IU] | Freq: Three times a day (TID) | INTRAMUSCULAR | Status: DC
  Administered 2023-09-02 – 2023-09-03 (×3): 1 [IU] via SUBCUTANEOUS

## 2023-09-02 MED ORDER — CLOTRIMAZOLE 1 % VA CREA
1.0000 | TOPICAL_CREAM | Freq: Every day | VAGINAL | Status: DC
  Administered 2023-09-02 – 2023-09-03 (×2): 1 via VAGINAL
  Filled 2023-09-02 (×2): qty 45

## 2023-09-02 MED ORDER — SODIUM CHLORIDE 0.9 % IV SOLN: INTRAVENOUS | Status: AC

## 2023-09-02 MED ORDER — POTASSIUM CHLORIDE CRYS ER 20 MEQ PO TBCR
40.0000 meq | EXTENDED_RELEASE_TABLET | Freq: Once | ORAL | Status: AC
  Administered 2023-09-02: 40 meq via ORAL
  Filled 2023-09-02: qty 2

## 2023-09-02 NOTE — Progress Notes (Signed)
 PROGRESS NOTE   Kaylee Maxwell  FMW:[id number]    DOB: [birth year]    DOA: 08/31/2023  PCP: Paseda, [name], FNP   I have briefly reviewed patients previous medical records in [facility].   Brief Hospital Course:  48 year old female with PMH of HTN, HLD, type II DM, renal mass/RCC s/p left partial nephrectomy 07/04/2023, iron deficiency anemia, palpitations/PVCs, gastritis/duodenitis, anxiety, hospitalized 7/1 - 7/3 for acute diverticulitis with small abscess not amenable to IR drainage, treated with IV Zosyn  while hospitalized, completed a 7-day course of Cipro /Flagyl  on discharge, ongoing chronic abdominal pain, nausea, intermittent vomiting, seen by oncology in office on 7/15 and had a repeat CT A/P with IV contrast done on 7/22 that showed persistent sigmoid colonic diverticulitis with fluid in the pelvis/sigmoid mesentery and inflamed colonic diverticulum, small focus of gas in the sigmoid mesentery measuring 18 mm in maximum dimension concerning for perforation.  Presented to ED and admitted for persistent mild diverticulitis.  Repeat CT A/P with contrast 7/24 showed persistent diverticulitis but without evidence of perforation or abscess.  [facility] consulted.  Improving.    Assessment & Plan:   Persistent acute diverticulitis Appears to have refractory/persistent symptoms since end of June 2025, despite appropriate treatment for acute diverticulitis including IV antibiotics followed by p.o. antibiotics. CT renal stone study on 7/1 (do not see this result in [facility] or Care Everywhere) as per DC summary from 7/3, showed distal sigmoid colon diverticulitis with small 2.4 x 2.3 cm abscess As stated above, completed a course of IV Zosyn  hospitalized followed by a week of Cipro /Flagyl . Repeat CT A/P with IV contrast only on 7/22 showed persistent sigmoid colonic diverticulitis with concern for perforation. Repeat CT A/P with contrast on 7/24 however showed persistent diverticulitis  but without evidence of perforation or abscess. Do not see a role for surgical intervention or endoscopies currently.  Patient may just need another course of IV antibiotics followed by oral antibiotics, supportive care and outpatient follow-up for interim colonoscopy. Continue supportive care, bowel rest, IV fluids, multimodality pain control, as needed IV antiemetics, IV Zosyn . Probably treat with IV antibiotics for 48 hours and pending improvement, DC home on Augmentin . [facility] consultation appreciated, started clear liquid diet which she is tolerating, agree with IV antibiotics x 48 hours followed by 2-week course of Augmentin  with outpatient follow-up for endoscopy.  Advance diet as tolerated. Has appointment to see Lauraine Furbish, [facility] PA on 8/26. Improving.  Hypokalemia Replace and follow.  Type II DM A1c 7.1 on 06/06/2023.  Hold metformin  Controlled on sensitive SSI.  Monitor.  Iron deficiency anemia Hemoglobin appears to be stable at prior baseline.  Transiently elevated hemoglobin on admission likely due to hemoconcentration. Follow CBCs daily  Essential hypertension Mildly uncontrolled at times Continue home dose of Toprol -XL 50 Mg daily.  Patient is not on ARB  Hyperlipidemia Continue home dose of statins  History of gastritis/duodenitis Continue twice daily PPI  Anxiety Not on meds PTA as per MAR.  RCC s/p left partial nephrectomy Management per outpatient oncology.   Body mass index is 33.45 kg/m./Obesity class I Complicates care.  Outpatient follow-up.   DVT prophylaxis: SCDs Start: 08/31/23 2314     Code Status: Full Code:  Family Communication: None at bedside. Disposition:  Inpatient appropriate.  IV fluids and antibiotics.     Consultants:   [facility]  Procedures:     Subjective:  Feels better.  Tolerating liquid diet.  No nausea or vomiting.  Has not  had a BM.  Abdominal pain is better.  Objective:   Vitals:   09/01/23 1334  09/01/23 1835 09/01/23 2000 09/02/23 0530  BP: 133/72 128/71 (!) 151/76 (!) 152/75  Pulse: (!) 49 60 62 (!) 54  Resp: 18 17 15 16   Temp: 98.5 F (36.9 C) 98.6 F (37 C) 97.9 F (36.6 C) 98.2 F (36.8 C)  TempSrc: Oral Oral Oral   SpO2: 100% 100% 100% 100%  Weight:      Height:        General exam: Young female, moderately built and obese lying propped up in bed, and appears comfortable and improved compared to yesterday. Respiratory system: Clear to auscultation. Respiratory effort normal. Cardiovascular system: S1 & S2 heard, RRR. No JVD, murmurs, rubs, gallops or clicks. No pedal edema.  Off telemetry. Gastrointestinal system: Abdomen is nondistended, soft and nontender. No organomegaly or masses felt. Normal bowel sounds heard. Central nervous system: Alert and oriented. No focal neurological deficits. Extremities: Symmetric 5 x 5 power. Skin: No rashes, lesions or ulcers Psychiatry: Judgement and insight appear normal. Mood & affect appropriate.     Data Reviewed:   I have personally reviewed following labs and imaging studies   CBC: Recent Labs  Lab 08/31/23 1115 09/01/23 0424 09/02/23 0434  WBC 5.8 4.7 3.5*  HGB 10.2* 8.9* 8.6*  HCT 35.5* 29.8* 29.1*  MCV 80.3 81.0 80.4  PLT 480* 389 371    Basic Metabolic Panel: Recent Labs  Lab 08/31/23 1115 09/01/23 0424 09/02/23 0434  NA 138 134* 136  K 4.0 3.5 3.3*  CL 105 105 105  CO2 22 21* 23  GLUCOSE 136* 102* 100*  BUN 8 7 5*  CREATININE 0.86 0.71 0.74  CALCIUM  9.6 8.4* 8.7*    Liver Function Tests: Recent Labs  Lab 08/31/23 1115  AST 19  ALT 14  ALKPHOS 61  BILITOT 0.6  PROT 8.6*  ALBUMIN 3.8    CBG: Recent Labs  Lab 09/01/23 2335 09/02/23 0334 09/02/23 0730  GLUCAP 139* 100* 107*    Microbiology Studies:  No results found for this or any previous visit (from the past 240 hours).  Radiology Studies:  CT ABDOMEN PELVIS W CONTRAST Result Date: 08/31/2023 CLINICAL DATA:  Suspected  diverticulitis. EXAM: CT ABDOMEN AND PELVIS WITH CONTRAST TECHNIQUE: Multidetector CT imaging of the abdomen and pelvis was performed using the standard protocol following bolus administration of intravenous contrast. RADIATION DOSE REDUCTION: This exam was performed according to the departmental dose-optimization program which includes automated exposure control, adjustment of the mA and/or kV according to patient size and/or use of iterative reconstruction technique. CONTRAST:  OMNIPAQUE  IOHEXOL  300 MG/ML  SOLN COMPARISON:  August 29, 2023 FINDINGS: Lower chest: No acute abnormality. Hepatobiliary: A stable hepatic cyst versus hemangioma is seen within the right lobe of the liver. No gallstones, gallbladder wall thickening, or biliary dilatation. Pancreas: Unremarkable. No pancreatic ductal dilatation or surrounding inflammatory changes. Spleen: Normal in size without focal abnormality. Adrenals/Urinary Tract: Adrenal glands are unremarkable. Kidneys are normal in size, without renal calculi or hydronephrosis. A stable renal cyst is seen along the posterior aspect of the lower pole of the left kidney. Postoperative changes within this region are also seen. Bladder is unremarkable. Stomach/Bowel: Stomach is within normal limits. Appendix appears normal. No evidence of bowel dilatation persistent moderately inflamed diverticula are seen within the mid sigmoid colon. There is no evidence of associated perforation or abscess noninflamed diverticula are noted throughout the remainder of the large bowel.  Vascular/Lymphatic: No significant vascular findings are present. A retroaortic left renal vein is seen. No enlarged abdominal or pelvic lymph nodes. Reproductive: Status post hysterectomy. No adnexal masses. Other: No abdominal wall hernia or abnormality. No abdominopelvic ascites. Musculoskeletal: Left iliac bone and chondromas are noted. No acute osseous abnormalities are identified IMPRESSION: 1. Persistent  sigmoid diverticulitis, without evidence of associated perforation or abscess. 2. Stable hepatic cyst versus hemangioma. 3. Stable left renal cyst and postoperative changes along the lower pole of the left kidney. Electronically Signed   By: Suzen Dials M.D.   On: 08/31/2023 19:46    Scheduled Meds:    aspirin  EC  81 mg Oral Daily   atorvastatin   10 mg Oral Daily   insulin  aspart  0-9 Units Subcutaneous Q4H   metoprolol  succinate  50 mg Oral Daily   pantoprazole   40 mg Oral BID    Continuous Infusions:    sodium chloride  125 mL/hr at 09/01/23 2316   piperacillin -tazobactam (ZOSYN )  IV 3.375 g (09/02/23 0336)     LOS: 0 days     Trenda Mar, MD,  FACP, [facility], [facility], [facility]   [facility] [place]      To contact the attending provider between 7A-7P or the covering provider during after hours 7P-7A, please log into the web site [email or web address] and access using universal [place] password for that web site. If you do not have the password, please call the hospital operator.  09/02/2023, 11:21 AM

## 2023-09-02 NOTE — Plan of Care (Signed)

## 2023-09-03 DIAGNOSIS — K5732 Diverticulitis of large intestine without perforation or abscess without bleeding: Secondary | ICD-10-CM | POA: Diagnosis not present

## 2023-09-03 LAB — BASIC METABOLIC PANEL WITH GFR
Anion gap: 6 (ref 5–15)
BUN: 6 mg/dL (ref 6–20)
CO2: 22 mmol/L (ref 22–32)
Calcium: 8.7 mg/dL — ABNORMAL LOW (ref 8.9–10.3)
Chloride: 108 mmol/L (ref 98–111)
Creatinine, Ser: 0.96 mg/dL (ref 0.44–1.00)
GFR, Estimated: >60 mL/min (ref >60)
Glucose, Bld: 107 mg/dL — ABNORMAL HIGH (ref 70–99)
Potassium: 3.6 mmol/L (ref 3.5–5.1)
Sodium: 136 mmol/L (ref 135–145)

## 2023-09-03 LAB — CBC
HCT: 29.7 % — ABNORMAL LOW (ref 36.0–46.0)
Hemoglobin: 8.5 g/dL — ABNORMAL LOW (ref 12.0–15.0)
MCH: 22.9 pg — ABNORMAL LOW (ref 26.0–34.0)
MCHC: 28.6 g/dL — ABNORMAL LOW (ref 30.0–36.0)
MCV: 80.1 fL (ref 80.0–100.0)
Platelets: 361 K/uL (ref 150–400)
RBC: 3.71 MIL/uL — ABNORMAL LOW (ref 3.87–5.11)
RDW: 18.4 % — ABNORMAL HIGH (ref 11.5–15.5)
WBC: 3.4 K/uL — ABNORMAL LOW (ref 4.0–10.5)
nRBC: 0 % (ref 0.0–0.2)

## 2023-09-03 LAB — GLUCOSE, CAPILLARY
Glucose-Capillary: 110 mg/dL — ABNORMAL HIGH (ref 70–99)
Glucose-Capillary: 144 mg/dL — ABNORMAL HIGH (ref 70–99)
Glucose-Capillary: 133 mg/dL — ABNORMAL HIGH (ref 70–99)

## 2023-09-03 MED ORDER — CLOTRIMAZOLE 1 % VA CREA: 1.0000 | TOPICAL_CREAM | Freq: Every day | VAGINAL | 0 refills | Status: AC

## 2023-09-03 MED ORDER — ACETAMINOPHEN 325 MG PO TABS: 650.0000 mg | ORAL_TABLET | Freq: Four times a day (QID) | ORAL | Status: AC | PRN

## 2023-09-03 MED ORDER — AMOXICILLIN-POT CLAVULANATE 875-125 MG PO TABS: 1.0000 | ORAL_TABLET | Freq: Two times a day (BID) | ORAL | 0 refills | Status: AC

## 2023-09-03 NOTE — Discharge Instructions (Signed)

## 2023-09-03 NOTE — Plan of Care (Signed)
  Problem: Activity: Goal: Risk for activity intolerance will decrease Outcome: Progressing   Problem: Pain Managment: Goal: General experience of comfort will improve and/or be controlled Outcome: Progressing

## 2023-09-03 NOTE — Discharge Summary (Signed)
 Physician Discharge Summary  Kaylee Maxwell FMW:[id number] DOB: [birth year]  PCP: Paseda, [name], FNP  Admitted from: Home Discharged to: Home  Admit date: 08/31/2023 Discharge date: 09/04/2023  Recommendations for Outpatient Follow-up:    Follow-up Information     Paseda, [name], FNP. Schedule an appointment as soon as possible for a visit in 5 day(s).   Specialty: Nurse Practitioner Why: To be seen with repeat labs (CBC & BMP), Hospital Follow Up. Contact information: [address], [place] [address] [phone]                  Home Health: None    Equipment/Devices: None    Discharge Condition: Improved and stable.   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/04/2023, iron deficiency anemia, palpitations/PVCs, gastritis/duodenitis, anxiety, hospitalized 7/1 - 7/3 for acute diverticulitis with small abscess not amenable to IR drainage, treated with IV Zosyn  while hospitalized, completed a 7-day course of Cipro /Flagyl  on discharge, ongoing chronic abdominal pain, nausea, intermittent vomiting, seen by oncology in office on 7/15 and had a repeat CT A/P with IV contrast done on 7/22 that showed persistent sigmoid colonic diverticulitis with fluid in the pelvis/sigmoid mesentery and inflamed colonic diverticulum, small focus of gas in the sigmoid mesentery measuring 18 mm in maximum dimension concerning for perforation.  Presented to ED and admitted for persistent mild diverticulitis.  Repeat CT A/P with contrast 7/24 showed persistent diverticulitis but without evidence of perforation or abscess.  [facility] consulted.  Improved.       Assessment & Plan:    Persistent acute diverticulitis Appears to have refractory/persistent symptoms since end of June 2025, despite appropriate treatment for acute diverticulitis including IV antibiotics followed by p.o. antibiotics. CT renal stone study on 7/1 (do not see this result in [facility] or Care Everywhere) as per DC summary from 7/3, showed distal sigmoid colon diverticulitis with small 2.4 x 2.3 cm abscess As stated above, completed a course of IV Zosyn  hospitalized followed by a week of Cipro /Flagyl . Repeat CT A/P with IV contrast only on 7/22 showed persistent sigmoid colonic diverticulitis with concern for perforation. Repeat CT A/P with contrast on 7/24 however showed persistent diverticulitis but without evidence of perforation or abscess. Do not see a role for surgical intervention or endoscopies currently.  Patient may just need another course of IV antibiotics followed by oral antibiotics, supportive care and outpatient follow-up for interim colonoscopy. Continue supportive care, bowel rest, IV fluids, multimodality pain control, as needed IV antiemetics, IV Zosyn . Probably treat with IV antibiotics for 48 hours and pending improvement, DC home on Augmentin . [facility] consultation appreciated, started clear liquid diet which she is tolerating, agree with IV antibiotics x 48 hours followed by 2-week course of Augmentin  with outpatient follow-up for endoscopy.  Advance diet as tolerated. Has appointment to see Lauraine Furbish, [facility] PA on 8/26. Clinically improved.  Tolerating soft diet.   Hypokalemia Replaced.   Type II DM A1c 7.1 on 06/06/2023.  Resumed metformin  at discharge.   Iron deficiency anemia From prior chart review, she has had hemoglobin mostly in the 8 g range in May, June and  early July.  Transiently up on admission to 10.2 which has then dropped and stable in the 8 g range.  No overt bleeding.  Follow CBC as outpatient. Hold  iron supplements for a week to avoid constipation, while she is recovering from her acute diverticulitis  Essential hypertension Controlled. Continue home dose of Toprol -XL 50 Mg daily.     Hyperlipidemia Continue home dose of statins   History of gastritis/duodenitis Continue twice daily PPI   Anxiety Not on meds PTA as per MAR.   RCC s/p left partial nephrectomy Management per outpatient oncology.     Body mass index is 33.45 kg/m./Obesity class I Complicates care.  Outpatient follow-up.     Consultants:   [facility]   Procedures:       Discharge Instructions     Medication List     PAUSE taking these medications    ferrous sulfate  325 (65 FE) MG EC tablet Wait to take this until: September 11, 2023 Take 1 tablet (325 mg total) by mouth daily.       STOP taking these medications    Tylenol  8 Hour Arthritis Pain 650 MG CR tablet Generic drug: acetaminophen  Replaced by: acetaminophen  325 MG tablet       TAKE these medications    acetaminophen  325 MG tablet Commonly known as: TYLENOL  Take 2 tablets (650 mg total) by mouth every 6 (six) hours as needed for mild pain (pain score 1-3), fever or moderate pain (pain score 4-6) (or Fever >/= 101). Replaces: Tylenol  8 Hour Arthritis Pain 650 MG CR tablet   amoxicillin -clavulanate 875-125 MG tablet Commonly known as: AUGMENTIN  Take 1 tablet by mouth 2 (two) times daily for 14 days.   aspirin  EC 81 MG tablet Take 81 mg by mouth in the morning. Swallow whole.   atorvastatin  10 MG tablet Commonly known as: LIPITOR Take 1 tablet (10 mg total) by mouth daily.   clotrimazole  1 % vaginal cream Commonly known as: GYNE-LOTRIMIN  Place 1 Applicatorful vaginally at bedtime for 6 days.   freestyle lancets Use as directed   FREESTYLE LITE test strip Generic drug: glucose blood Use as instructed   metFORMIN  500 MG 24 hr tablet Commonly known as: GLUCOPHAGE -XR Take 500 mg with breakfast daily by mouth  for  one week , then increase to 500 mg by mouth twice daily with meal What changed:  how much to take how to take this when to take this additional instructions   metoprolol  succinate 50 MG 24 hr tablet Commonly known as: TOPROL -XL Take 1 tablet (50 mg total) by mouth daily. Take with or immediately following a meal. Please schedule follow up appt for more refills What changed: additional instructions   ondansetron  4 MG disintegrating tablet Commonly known as: ZOFRAN -ODT Take 4 mg by mouth daily as needed for nausea or vomiting.   oxyCODONE  5 MG immediate release tablet Commonly known as: Oxy IR/ROXICODONE  Take 1 tablet (5 mg total) by mouth every 4 (four) hours as needed for severe pain (pain score 7-10). What changed:  when to take this reasons to take this   pantoprazole  40 MG tablet Commonly known as: PROTONIX  Take 40 mg by mouth 2 (two) times daily.   traZODone  50 MG tablet Commonly known as: DESYREL  Take 1 tablet (50 mg total) by mouth at bedtime as needed for sleep       Allergies  Allergen Reactions   Ozempic  (0.25 Or 0.5 Mg-Dose) [Semaglutide (0.25 Or 0.5mg -Dos)] Other (See Comments)    Abdominal  pain      Procedures/Studies: CT ABDOMEN PELVIS W CONTRAST Result Date: 08/31/2023 CLINICAL DATA:  Suspected diverticulitis. EXAM: CT ABDOMEN AND PELVIS WITH CONTRAST TECHNIQUE: Multidetector CT imaging of the abdomen and pelvis was performed using the standard protocol following bolus administration of intravenous contrast. RADIATION DOSE REDUCTION: This exam was performed according to the departmental dose-optimization program which includes automated exposure control, adjustment of the mA and/or kV according to patient size and/or use of iterative reconstruction technique. CONTRAST:  OMNIPAQUE  IOHEXOL  300 MG/ML  SOLN COMPARISON:  August 29, 2023 FINDINGS: Lower chest: No acute abnormality. Hepatobiliary: A stable hepatic cyst versus hemangioma is seen within the right  lobe of the liver. No gallstones, gallbladder wall thickening, or biliary dilatation. Pancreas: Unremarkable. No pancreatic ductal dilatation or surrounding inflammatory changes. Spleen: Normal in size without focal abnormality. Adrenals/Urinary Tract: Adrenal glands are unremarkable. Kidneys are normal in size, without renal calculi or hydronephrosis. A stable renal cyst is seen along the posterior aspect of the lower pole of the left kidney. Postoperative changes within this region are also seen. Bladder is unremarkable. Stomach/Bowel: Stomach is within normal limits. Appendix appears normal. No evidence of bowel dilatation persistent moderately inflamed diverticula are seen within the mid sigmoid colon. There is no evidence of associated perforation or abscess noninflamed diverticula are noted throughout the remainder of the large bowel. Vascular/Lymphatic: No significant vascular findings are present. A retroaortic left renal vein is seen. No enlarged abdominal or pelvic lymph nodes. Reproductive: Status post hysterectomy. No adnexal masses. Other: No abdominal wall hernia or abnormality. No abdominopelvic ascites. Musculoskeletal: Left iliac bone and chondromas are noted. No acute osseous abnormalities are identified IMPRESSION: 1. Persistent sigmoid diverticulitis, without evidence of associated perforation or abscess. 2. Stable hepatic cyst versus hemangioma. 3. Stable left renal cyst and postoperative changes along the lower pole of the left kidney. Electronically Signed   By: Suzen Dials M.D.   On: 08/31/2023 19:46   CT ABDOMEN PELVIS W WO CONTRAST Result Date: 08/30/2023 CLINICAL DATA:  History of kidney cancer post nephrectomy, follow-up. * Tracking Code: BO *. Recent severe diverticulitis/abscess. EXAM: CT ABDOMEN AND PELVIS WITHOUT AND WITH CONTRAST TECHNIQUE: Multidetector CT imaging of the abdomen and pelvis was performed following the standard protocol before and following the bolus  administration of intravenous contrast. RADIATION DOSE REDUCTION: This exam was performed according to the departmental dose-optimization program which includes automated exposure control, adjustment of the mA and/or kV according to patient size and/or use of iterative reconstruction technique. CONTRAST:  OMNIPAQUE  IOHEXOL  300 MG/ML  SOLN COMPARISON:  Multiple priors including MRI December 05, 2022 is CT December 05, 2022. FINDINGS: Lower chest: No acute abnormality. Hepatobiliary: Cyst in the right lobe of the liver. No suspicious hepatic lesion. Gallbladder is unremarkable. Similar prominence of the extrahepatic biliary tree with a periampullary duodenal diverticulum, prominence of the tree is favored sequela of chronic compression by the diverticulum given lack of findings on prior MRI. Pancreas: No pancreatic ductal dilation or evidence of acute inflammation. Stable 5 mm cystic lesion in the pancreatic head/uncinate process on image 53/2. Spleen: No splenomegaly. Adrenals/Urinary Tract: No suspicious adrenal nodule/mass. Postsurgical change of left lower pole partial nephrectomy. There is a 4.1 x 3.0 cm collection in the surgical bed on image 47/7 with an enhancing nodular focus along the superior anterior margin of this collection measuring 7 mm on image 43/2, which is hypoenhancing in comparison to adjacent renal cortex. No hydronephrosis. Urinary bladder is unremarkable for degree of  distension. Stomach/Bowel: Persistent sigmoid colonic diverticulitis with fluid in the pelvis/sigmoid mesentery and inflamed colonic diverticulum on image 94/11. Small focus of gas in the sigmoid mesentery measures 18 mm in maximum dimension on image 106/11. Vascular/Lymphatic: Normal caliber abdominal aorta. No evidence of tumor in renal vein. No pathologically enlarged mediastinal, hilar or axillary lymph nodes. Reproductive: Uterus and bilateral adnexa are unremarkable in CT appearance for reproductive age female. Other:  Postsurgical change in the abdominal wall. Musculoskeletal: Left iliac bone enchondromas again seen. No new suspicious lytic or blastic lesion of bone. IMPRESSION: 1. Postsurgical change of left lower pole partial nephrectomy. There is a 4.1 x 3.0 cm collection in the surgical bed with an enhancing nodular focus along the superior anterior margin of this collection measuring 7 mm, which is hypoenhancing in comparison to adjacent renal cortex. While this may reflect a small amount of residual renal parenchyma, recommend attention on short-term interval follow-up imaging to exclude residual/recurrent disease. 2. No evidence of metastatic disease in the abdomen or pelvis. 3. Persistent sigmoid colonic diverticulitis with fluid in the pelvis/sigmoid mesentery and inflamed colonic diverticulum. Small focus of gas in the sigmoid mesentery measures 18 mm in maximum dimension, concerning for perforation. Consider further evaluation by CT abdomen and pelvis with enteric contrast material. 4. Stable 5 mm cystic lesion in the pancreatic head/uncinate process, likely a side branch IPMN. Recommend attention on follow-up imaging. Electronically Signed   By: Reyes Holder M.D.   On: 08/30/2023 16:38      Subjective: Overall feels much better.  Tolerating soft diet without nausea and vomiting.  Had BM on 7/24 but passing gas.  No abdominal distention.  Minimal abdominal pain but much improved compared to admission.  Has used infrequent pain meds in the last 24 hours.  Feels comfortable going home.  Discussed in detail regarding dietary restrictions discharge related to her diverticulitis and she verbalized understanding.  Discharge Exam:  Vitals:   09/03/23 0129 09/03/23 1415 09/03/23 2141 09/04/23 0600  BP: 139/69 136/76 139/70 (!) 141/75  Pulse: (!) 59 67 (!) 52 73  Resp: 17 16 16 16   Temp: 98.7 F (37.1 C) 98.7 F (37.1 C) 98 F (36.7 C) 98.6 F (37 C)  TempSrc: Oral Oral Oral Oral  SpO2: 100% 100% 100% 99%   Weight:      Height:        General exam: Young female, moderately built and obese lying for Tublu propped up in bed without distress.  Oral mucosa moist. Respiratory system: Clear to auscultation. Respiratory effort normal. Cardiovascular system: S1 & S2 heard, RRR. No JVD, murmurs, rubs, gallops or clicks. No pedal edema.  Off telemetry. Gastrointestinal system: Abdomen is nondistended, soft and nontender. No organomegaly or masses felt. Normal bowel sounds heard. Central nervous system: Alert and oriented. No focal neurological deficits. Extremities: Symmetric 5 x 5 power. Skin: No rashes, lesions or ulcers Psychiatry: Judgement and insight appear normal. Mood & affect appropriate.     The results of significant diagnostics from this hospitalization (including imaging, microbiology, ancillary and laboratory) are listed below for reference.     Microbiology: No results found for this or any previous visit (from the past 240 hours).   Labs: CBC: Recent Labs  Lab 08/31/23 1115 09/01/23 0424 09/02/23 0434 09/03/23 0430 09/04/23 0439  WBC 5.8 4.7 3.5* 3.4* 4.8  HGB 10.2* 8.9* 8.6* 8.5* 8.9*  HCT 35.5* 29.8* 29.1* 29.7* 29.9*  MCV 80.3 81.0 80.4 80.1 80.4  PLT 480* 389 371 361  359    Basic Metabolic Panel: Recent Labs  Lab 08/31/23 1115 09/01/23 0424 09/02/23 0434 09/03/23 0430 09/04/23 0439  NA 138 134* 136 136 136  K 4.0 3.5 3.3* 3.6 3.5  CL 105 105 105 108 106  CO2 22 21* 23 22 21*  GLUCOSE 136* 102* 100* 107* 100*  BUN 8 7 5* 6 8  CREATININE 0.86 0.71 0.74 0.96 1.03*  CALCIUM  9.6 8.4* 8.7* 8.7* 8.8*    Liver Function Tests: Recent Labs  Lab 08/31/23 1115  AST 19  ALT 14  ALKPHOS 61  BILITOT 0.6  PROT 8.6*  ALBUMIN 3.8    CBG: Recent Labs  Lab 09/02/23 2117 09/03/23 0750 09/03/23 1203 09/03/23 1740 09/04/23 0728  GLUCAP 115* 110* 144* 133* 111*     Urinalysis    Component Value Date/Time   COLORURINE STRAW (A) 08/31/2023 2002    APPEARANCEUR CLEAR 08/31/2023 2002   LABSPEC 1.013 08/31/2023 2002   PHURINE 6.0 08/31/2023 2002   GLUCOSEU NEGATIVE 08/31/2023 2002   HGBUR NEGATIVE 08/31/2023 2002   BILIRUBINUR NEGATIVE 08/31/2023 2002   BILIRUBINUR negative 04/16/2021 1419   BILIRUBINUR neg 09/05/2019 1337   KETONESUR NEGATIVE 08/31/2023 2002   PROTEINUR NEGATIVE 08/31/2023 2002   UROBILINOGEN 0.2 04/16/2021 1419   UROBILINOGEN 1.0 03/24/2007 0542   NITRITE NEGATIVE 08/31/2023 2002   LEUKOCYTESUR NEGATIVE 08/31/2023 2002   Discussed in detail with patient's son at bedside.   Time coordinating discharge: 25 minutes  SIGNED:  Trenda Mar, MD,  FACP, [facility], [facility], [facility]   [facility] [place]     To contact the attending provider between 7A-7P or the covering provider during after hours 7P-7A, please log into the web site [email or web address] and access using universal [place] password for that web site. If you do not have the password, please call the hospital operator.

## 2023-09-03 NOTE — Plan of Care (Signed)
 ?  Problem: Clinical Measurements: ?Goal: Ability to maintain clinical measurements within normal limits will improve ?Outcome: Progressing ?Goal: Will remain free from infection ?Outcome: Progressing ?Goal: Diagnostic test results will improve ?Outcome: Progressing ?  ?

## 2023-09-04 LAB — CBC
HCT: 29.9 % — ABNORMAL LOW (ref 36.0–46.0)
Hemoglobin: 8.9 g/dL — ABNORMAL LOW (ref 12.0–15.0)
MCH: 23.9 pg — ABNORMAL LOW (ref 26.0–34.0)
MCHC: 29.8 g/dL — ABNORMAL LOW (ref 30.0–36.0)
MCV: 80.4 fL (ref 80.0–100.0)
Platelets: 359 K/uL (ref 150–400)
RBC: 3.72 MIL/uL — ABNORMAL LOW (ref 3.87–5.11)
RDW: 18.4 % — ABNORMAL HIGH (ref 11.5–15.5)
WBC: 4.8 K/uL (ref 4.0–10.5)
nRBC: 0 % (ref 0.0–0.2)

## 2023-09-04 LAB — BASIC METABOLIC PANEL WITH GFR
Anion gap: 9 (ref 5–15)
BUN: 8 mg/dL (ref 6–20)
CO2: 21 mmol/L — ABNORMAL LOW (ref 22–32)
Calcium: 8.8 mg/dL — ABNORMAL LOW (ref 8.9–10.3)
Chloride: 106 mmol/L (ref 98–111)
Creatinine, Ser: 1.03 mg/dL — ABNORMAL HIGH (ref 0.44–1.00)
GFR, Estimated: >60 mL/min (ref >60)
Glucose, Bld: 100 mg/dL — ABNORMAL HIGH (ref 70–99)
Potassium: 3.5 mmol/L (ref 3.5–5.1)
Sodium: 136 mmol/L (ref 135–145)

## 2023-09-04 LAB — GLUCOSE, CAPILLARY: Glucose-Capillary: 111 mg/dL — ABNORMAL HIGH (ref 70–99)

## 2023-09-04 NOTE — Progress Notes (Signed)
 Reviewed discharge instructions with pt. All questions answered. Pt waiting for discharge transport.

## 2023-09-04 NOTE — Progress Notes (Signed)
 [facility] no charge Progress Note  Patient was supposed to discharge 7/27.  I had completed all her paperwork including discharge meds DC summary etc. but it appears that I forgot to place DC home order.  Also nursing also did not catch this and contact me.  Apologized to patient this morning for delays.  She continues to do well.  Tolerating soft diet.  Mild intermittent abdominal pain but significantly better than on admission.  Last BM yesterday, soft.  No nausea or vomiting.  Ambulating well in the room  Patient is lying comfortably in bed without distress. Vital signs stable.  Abdomen nondistended, soft and nontender.  No organomegaly or masses appreciated.  Normal bowel sounds heard.  Labs reviewed.  BMP unremarkable.  Hemoglobin 8.9, stable compared to prior.  Mild transient leukopenia has resolved.  A/P Persistent acute diverticulitis Stable for DC home on Augmentin  x 2 weeks and outpatient follow-up with [facility] as per in DC summary from 7/27.    Trenda Mar, MD,  FACP, [facility], [facility], [facility]   [facility] [place]     To contact the attending provider between 7A-7P or the covering provider during after hours 7P-7A, please log into the web site [email or web address] and access using universal [place] password for that web site. If you do not have the password, please call the hospital operator.

## 2023-09-07 ENCOUNTER — Encounter: Payer: Self-pay | Admitting: Hematology and Oncology

## 2023-09-07 ENCOUNTER — Other Ambulatory Visit: Payer: Self-pay

## 2023-09-07 ENCOUNTER — Other Ambulatory Visit (HOSPITAL_COMMUNITY): Payer: Self-pay

## 2023-09-07 ENCOUNTER — Inpatient Hospital Stay: Admitting: Hematology and Oncology

## 2023-09-07 ENCOUNTER — Telehealth: Payer: Self-pay | Admitting: Nurse Practitioner

## 2023-09-07 VITALS — BP 150/84 | HR 73 | Temp 99.0°F | Resp 18 | Ht 65.0 in | Wt 194.6 lb

## 2023-09-07 DIAGNOSIS — K5732 Diverticulitis of large intestine without perforation or abscess without bleeding: Secondary | ICD-10-CM

## 2023-09-07 DIAGNOSIS — D509 Iron deficiency anemia, unspecified: Secondary | ICD-10-CM

## 2023-09-07 DIAGNOSIS — C642 Malignant neoplasm of left kidney, except renal pelvis: Secondary | ICD-10-CM | POA: Diagnosis not present

## 2023-09-07 MED ORDER — METOPROLOL SUCCINATE ER 50 MG PO TB24
50.0000 mg | ORAL_TABLET | Freq: Every day | ORAL | 3 refills | Status: AC
  Filled 2023-09-07: qty 90, 90d supply, fill #0
  Filled 2023-12-05: qty 90, 90d supply, fill #1
  Filled 2024-03-11: qty 90, 90d supply, fill #2

## 2023-09-07 MED ORDER — METOPROLOL SUCCINATE ER 50 MG PO TB24
50.0000 mg | ORAL_TABLET | Freq: Every day | ORAL | 0 refills | Status: DC
  Filled 2023-09-07: qty 30, 30d supply, fill #0

## 2023-09-07 NOTE — Assessment & Plan Note (Addendum)
 She has confirm iron deficiency anemia She is taking oral iron supplement She has appointment pending to see GI to follow I plan to repeat iron studies in 3 months

## 2023-09-07 NOTE — Assessment & Plan Note (Addendum)
 She has recurrent admissions to the hospital with sigmoid diverticulitis She will continue antibiotics as directed

## 2023-09-07 NOTE — Assessment & Plan Note (Addendum)
 The patient was noted to have abnormal imaging study last year Subsequently, she underwent surgical resection of the left kidney mass in May Final pathology clear-cell  CT imaging from July 2025 showed no evidence of cancer recurrence She will [facility] Based on latest NCCN guidelines, she does not need genetic counseling

## 2023-09-07 NOTE — Addendum Note (Signed)
 Addended by: DARIO IZETTA CROME on: 09/07/2023 04:42 PM   Modules accepted: Orders

## 2023-09-07 NOTE — Telephone Encounter (Signed)
*  STAT* If patient is at the pharmacy, call can be transferred to refill team.   1. Which medications need to be refilled? (please list name of each medication and dose if known)   metoprolol  succinate (TOPROL -XL) 50 MG 24 hr tablet    2. Which pharmacy/location (including street and city if local pharmacy) is medication to be sent to?  [place] [facility]      3. Do they need a 30 day or 90 day supply? 90 day    Pt is out of medication

## 2023-09-07 NOTE — Telephone Encounter (Signed)
 Rx sent to pharmacy

## 2023-09-07 NOTE — Progress Notes (Signed)
 [facility] OFFICE PROGRESS NOTE  Patient Care Team: Paseda, [name], FNP as PCP - General (Nurse Practitioner)  Assessment & Plan Cancer of left kidney [facility]) The patient was noted to have abnormal imaging study last year Subsequently, she underwent surgical resection of the left kidney mass in May Final pathology clear-cell  CT imaging from July 2025 showed no evidence of cancer recurrence She will [facility] Based on latest NCCN guidelines, she does not need genetic counseling Iron deficiency anemia, unspecified iron deficiency anemia type She has confirm iron deficiency anemia She is taking oral iron supplement She has appointment pending to see GI to follow I plan to repeat iron studies in 3 months Sigmoid diverticulitis She has recurrent admissions to the hospital with sigmoid diverticulitis She will continue antibiotics as directed  Orders Placed This Encounter  Procedures   Ferritin    Standing Status:   Future    Expiration Date:   09/06/2024   Iron and Iron Binding Capacity ([facility] only)    Standing Status:   Future    Expiration Date:   09/06/2024   CBC with Differential (Cancer Center Only)    Standing Status:   Future    Expiration Date:   09/06/2024   Sedimentation rate    Standing Status:   Future    Expiration Date:   09/06/2024   Basic Metabolic Panel - Cancer Center Only    Standing Status:   Future    Expiration Date:   09/06/2024     [name], MD  INTERVAL HISTORY: she returns for surveillance follow-up for recent resected kidney cancer She was sent to the hospital after CT imaging showed possible perforated diverticulitis She was readmitted with extensive workup and IV antibiotics Her pain is improved  PHYSICAL EXAMINATION: ECOG PERFORMANCE STATUS: 1 - Symptomatic but completely ambulatory  Vitals:   09/07/23 1347  BP: (!) 150/84  Pulse: 73  Resp: 18  Temp: 99 F (37.2 C)  SpO2: 100%    Filed Weights   09/07/23 1347  Weight: 194 lb 9.6 oz (88.3 kg)    Relevant data reviewed during this visit included CT imaging from July 2025

## 2023-09-08 ENCOUNTER — Other Ambulatory Visit: Payer: Self-pay

## 2023-09-08 ENCOUNTER — Other Ambulatory Visit (HOSPITAL_BASED_OUTPATIENT_CLINIC_OR_DEPARTMENT_OTHER): Payer: Self-pay

## 2023-09-08 ENCOUNTER — Ambulatory Visit: Admitting: Nurse Practitioner

## 2023-09-08 VITALS — BP 142/70 | HR 100 | Temp 96.8°F | Wt 196.0 lb

## 2023-09-08 DIAGNOSIS — N760 Acute vaginitis: Secondary | ICD-10-CM | POA: Diagnosis not present

## 2023-09-08 DIAGNOSIS — C642 Malignant neoplasm of left kidney, except renal pelvis: Secondary | ICD-10-CM

## 2023-09-08 DIAGNOSIS — D509 Iron deficiency anemia, unspecified: Secondary | ICD-10-CM | POA: Diagnosis not present

## 2023-09-08 DIAGNOSIS — I10 Essential (primary) hypertension: Secondary | ICD-10-CM

## 2023-09-08 DIAGNOSIS — E785 Hyperlipidemia, unspecified: Secondary | ICD-10-CM

## 2023-09-08 DIAGNOSIS — K5792 Diverticulitis of intestine, part unspecified, without perforation or abscess without bleeding: Secondary | ICD-10-CM | POA: Insufficient documentation

## 2023-09-08 DIAGNOSIS — E119 Type 2 diabetes mellitus without complications: Secondary | ICD-10-CM | POA: Diagnosis not present

## 2023-09-08 LAB — POCT GLYCOSYLATED HEMOGLOBIN (HGB A1C): Hemoglobin A1C: 5.7 % — AB (ref 4.0–5.6)

## 2023-09-08 MED ORDER — FLUCONAZOLE 150 MG PO TABS
150.0000 mg | ORAL_TABLET | Freq: Once | ORAL | 0 refills | Status: AC
  Filled 2023-09-08: qty 1, 1d supply, fill #0

## 2023-09-08 NOTE — Assessment & Plan Note (Signed)
 Lab Results  Component Value Date   HGBA1C 5.7 (A) 09/08/2023  Currently well-controlled on metformin  500 mg twice daily Continue current medication, up-to-date with diabetes eye exam

## 2023-09-08 NOTE — Assessment & Plan Note (Addendum)
 BP Readings from Last 3 Encounters:  09/08/23 (!) 142/70  09/07/23 (!) 150/84  09/04/23 (!) 141/75  Blood pressure goal is less than 130/80 Metoprolol  50 mg daily, has been refilled, Discussed DASH diet and dietary sodium restrictions Continue to increase dietary efforts and exercise.

## 2023-09-08 NOTE — Assessment & Plan Note (Signed)
 Continue Augmentin  1 tablet twice daily Maintain close follow-up with GI

## 2023-09-08 NOTE — Assessment & Plan Note (Signed)
 Lab Results  Component Value Date   CHOL 127 07/21/2023   HDL 30 (L) 07/21/2023   LDLCALC 70 07/21/2023   LDLDIRECT 71 10/28/2022   TRIG 155 (H) 07/21/2023   CHOLHDL 4.2 07/21/2023  Continue atorvastatin  10 mg daily Lipid panel at next visit

## 2023-09-08 NOTE — Assessment & Plan Note (Signed)
 Followed by urology.

## 2023-09-08 NOTE — Assessment & Plan Note (Signed)
 Fluconazole  50 mg one-time dose ordered Not able to obtain a vaginal swab today because she is on her menses

## 2023-09-08 NOTE — Progress Notes (Signed)
 Established Patient Office Visit  Subjective:  Patient ID: Kaylee Maxwell, female    DOB: 12-May-1975  Age: 48 y.o. MRN: [id number]  CC:  Chief Complaint  Patient presents with   Diabetes    HPI Kaylee Maxwell is a 48 y.o. female   has a past medical history of Anxiety (03/04/2018), Breast cancer screening (03/04/2018), Heart palpitations (03/04/2018), Hypertension, Iron deficiency, Irregular heartbeat (03/04/2018), Mild mitral regurgitation, Obesity, Osteochondroma, PVC's (premature ventricular contractions), Scalp mass (10/09/2012), Sinus tachycardia, and Type 2 diabetes mellitus (HCC).  Patient presents for follow-up for her chronic medical conditions  Persistent acute diverticulitis she was admitted twice in July for diverticulitis , recent admission was from 08/31/2023 to 09/04/2023 was treated with IV Zosyn , discharged home on Augmentin  for 2 weeks, has upcoming appointment with GI.  States that she feels better still has some pain on the right lower abdomen.  Holding iron for a week to avoid constipation while recovering from acute diverticulitis.   Hypertension.  Currently on metoprolol  50 mg daily, spironolactone  and valsartan  were discontinued when she was on admission earlier on July 1st. stated that she has been out of metoprolol  for about 2 days.  Currently denies chest pain shortness of breath edema  Type 2 diabetes, on metformin  500 mg twice daily takes atorvastatin  10 mg daily for hyperlipidemia.  Acute vaginitis.  She was prescribed clotrimazole  1% vaginal cream at the hospital, stated that she has used the medication without relief, she is currently on her menses      Past Medical History:  Diagnosis Date   Anxiety 03/04/2018   Breast cancer screening 03/04/2018   Heart palpitations 03/04/2018   Monitor 9/21: normal sinus rhythm, sinus tachycardia, Avg HR 110; occ PVCs   Hypertension    Iron deficiency    Irregular heartbeat 03/04/2018   Mild mitral  regurgitation    Obesity    Osteochondroma    PVC's (premature ventricular contractions)    Scalp mass 10/09/2012   Sinus tachycardia    Type 2 diabetes mellitus [facility])     Past Surgical History:  Procedure Laterality Date   ANKLE FRACTURE SURGERY Right    BIOPSY  02/02/2023   Procedure: BIOPSY;  Surgeon: Wilhelmenia Aloha Raddle., MD;  Location: [facility];  Service: Gastroenterology;;   ROMAYNE Right    cyst removal from scalp     ESOPHAGOGASTRODUODENOSCOPY N/A 02/02/2023   Procedure: ESOPHAGOGASTRODUODENOSCOPY (EGD);  Surgeon: Wilhelmenia Aloha Raddle., MD;  Location: [facility];  Service: Gastroenterology;  Laterality: N/A;   EUS N/A 02/02/2023   Procedure: UPPER ENDOSCOPIC ULTRASOUND (EUS) RADIAL;  Surgeon: Wilhelmenia Aloha Raddle., MD;  Location: [facility];  Service: Gastroenterology;  Laterality: N/A;   left leg bone removal     ROBOTIC ASSITED PARTIAL NEPHRECTOMY Left 07/04/2023   Procedure: XI ROBOTIC ASSITED LEFT  PARTIAL NEPHRECTOMY;  Surgeon: Devere Lonni Righter, MD;  Location: [facility];  Service: Urology;  Laterality: Left;  180 MINUTES    Family History  Problem Relation Age of Onset   Diabetes Mother    Hypertension Mother    Obesity Mother    Hypertension Father    Cancer Paternal Aunt        pancreatic   Cancer Maternal Grandmother        lung   Autism spectrum disorder Sister     Social History   Socioeconomic History   Marital status: Single    Spouse name: Not on file   Number of children: 2   Years  of education: Not on file   Highest education level: 12th grade  Occupational History   Not on file  Tobacco Use   Smoking status: Former    Current packs/day: 0.50    Average packs/day: 0.5 packs/day for 18.0 years (9.0 ttl pk-yrs)    Types: Cigarettes    Passive exposure: Never   Smokeless tobacco: Never  Vaping Use   Vaping status: Every Day   Substances: Nicotine , Flavoring  Substance and Sexual Activity   Alcohol use: Yes     Comment: occasional   Drug use: No   Sexual activity: Yes    Birth control/protection: None  Other Topics Concern   Not on file  Social History Narrative   Lives with her sons.    Social Drivers of Health   Financial Resource Strain: Medium Risk (09/08/2023)   Overall Financial Resource Strain (CARDIA)    Difficulty of Paying Living Expenses: Somewhat hard  Food Insecurity: No Food Insecurity (09/08/2023)   Hunger Vital Sign    Worried About Running Out of Food in the Last Year: Never true    Ran Out of Food in the Last Year: Never true  Transportation Needs: No Transportation Needs (09/08/2023)   PRAPARE - Administrator, Civil Service (Medical): No    Lack of Transportation (Non-Medical): No  Physical Activity: Unknown (09/08/2023)   Exercise Vital Sign    Days of Exercise per Week: Patient declined    Minutes of Exercise per Session: Not on file  Stress: No Stress Concern Present (09/08/2023)   [employer] of Occupational Health - Occupational Stress Questionnaire    Feeling of Stress: Not at all  Social Connections: Moderately Isolated (09/08/2023)   Social Connection and Isolation Panel    Frequency of Communication with Friends and Family: Three times a week    Frequency of Social Gatherings with Friends and Family: Once a week    Attends Religious Services: More than 4 times per year    Active Member of [employer] or Organizations: No    Attends Engineer, structural: Not on file    Marital Status: Never married  Intimate Partner Violence: Not At Risk (09/01/2023)   Humiliation, Afraid, Rape, and Kick questionnaire    Fear of Current or Ex-Partner: No    Emotionally Abused: No    Physically Abused: No    Sexually Abused: No    Outpatient Medications Prior to Visit  Medication Sig Dispense Refill   acetaminophen  (TYLENOL ) 325 MG tablet Take 2 tablets (650 mg total) by mouth every 6 (six) hours as needed for mild pain (pain score 1-3), fever or moderate pain  (pain score 4-6) (or Fever >/= 101).     amoxicillin -clavulanate (AUGMENTIN ) 875-125 MG tablet Take 1 tablet by mouth 2 (two) times daily for 14 days. 28 tablet 0   aspirin  EC 81 MG tablet Take 81 mg by mouth in the morning. Swallow whole.     atorvastatin  (LIPITOR) 10 MG tablet Take 1 tablet (10 mg total) by mouth daily. 90 tablet 1   ferrous sulfate  325 (65 FE) MG EC tablet Take 1 tablet (325 mg total) by mouth daily. 90 tablet 1   metFORMIN  (GLUCOPHAGE -XR) 500 MG 24 hr tablet Take 500 mg with breakfast daily by mouth  for one week , then increase to 500 mg by mouth twice daily with meal 180 tablet 1   oxyCODONE  (OXY IR/ROXICODONE ) 5 MG immediate release tablet Take 1 tablet (5 mg total) by mouth  every 4 (four) hours as needed for severe pain (pain score 7-10). 60 tablet 0   pantoprazole  (PROTONIX ) 40 MG tablet Take 40 mg by mouth 2 (two) times daily.     traZODone  (DESYREL ) 50 MG tablet Take 1 tablet (50 mg total) by mouth at bedtime as needed for sleep 90 tablet 1   clotrimazole  (GYNE-LOTRIMIN ) 1 % vaginal cream Place 1 Applicatorful vaginally at bedtime for 6 days. (Patient not taking: Reported on 09/08/2023) 45 g 0   glucose blood (GNP TRUE METRIX GLUCOSE STRIPS) test strip Use as instructed (Patient not taking: Reported on 09/08/2023) 100 each 12   Lancets (FREESTYLE) lancets Use as directed (Patient not taking: Reported on 09/08/2023) 100 each 12   metoprolol  succinate (TOPROL -XL) 50 MG 24 hr tablet Take 1 tablet (50 mg total) by mouth daily. Take with or immediately following a meal. Please schedule follow up appt for more refills (Patient not taking: Reported on 09/08/2023) 90 tablet 3   ondansetron  (ZOFRAN -ODT) 4 MG disintegrating tablet Take 4 mg by mouth daily as needed for nausea or vomiting. (Patient not taking: Reported on 09/08/2023)     No facility-administered medications prior to visit.    Allergies  Allergen Reactions   Ozempic  (0.25 Or 0.5 Mg-Dose) [Semaglutide (0.25 Or 0.5mg -Dos)]  Other (See Comments)    Abdominal pain    ROS Review of Systems  Constitutional:  Negative for appetite change, chills, fatigue and fever.  HENT:  Negative for congestion, postnasal drip, rhinorrhea and sneezing.   Respiratory:  Negative for cough, shortness of breath and wheezing.   Cardiovascular:  Negative for chest pain, palpitations and leg swelling.  Gastrointestinal:  Positive for abdominal pain. Negative for constipation, nausea and vomiting.  Genitourinary:  Negative for difficulty urinating, dysuria, flank pain and frequency.  Musculoskeletal:  Negative for arthralgias, back pain, joint swelling and myalgias.  Skin:  Negative for color change, pallor, rash and wound.  Neurological:  Negative for dizziness, facial asymmetry, weakness, numbness and headaches.  Psychiatric/Behavioral:  Negative for behavioral problems, confusion, self-injury and suicidal ideas.       Objective:    Physical Exam Vitals and nursing note reviewed.  Constitutional:      General: She is not in acute distress.    Appearance: Normal appearance. She is obese. She is not ill-appearing, toxic-appearing or diaphoretic.  Eyes:     General: No scleral icterus.       Right eye: No discharge.        Left eye: No discharge.     Extraocular Movements: Extraocular movements intact.     Conjunctiva/sclera: Conjunctivae normal.  Cardiovascular:     Rate and Rhythm: Normal rate and regular rhythm.     Pulses: Normal pulses.     Heart sounds: Normal heart sounds. No murmur heard.    No friction rub. No gallop.  Pulmonary:     Effort: Pulmonary effort is normal. No respiratory distress.     Breath sounds: Normal breath sounds. No stridor. No wheezing, rhonchi or rales.  Chest:     Chest wall: No tenderness.  Abdominal:     General: There is no distension.     Palpations: Abdomen is soft.     Tenderness: There is abdominal tenderness. There is no right CVA tenderness, left CVA tenderness or guarding.   Musculoskeletal:        General: No swelling, tenderness, deformity or signs of injury.     Right lower leg: No edema.     Left  lower leg: No edema.  Skin:    General: Skin is warm and dry.     Capillary Refill: Capillary refill takes less than 2 seconds.     Coloration: Skin is not jaundiced or pale.     Findings: No bruising, erythema or lesion.  Neurological:     Mental Status: She is alert and oriented to person, place, and time.     Motor: No weakness.     Coordination: Coordination normal.     Gait: Gait normal.  Psychiatric:        Mood and Affect: Mood normal.        Behavior: Behavior normal.        Thought Content: Thought content normal.        Judgment: Judgment normal.     BP (!) 142/70   Pulse 100   Temp (!) 96.8 F (36 C)   Wt 196 lb (88.9 kg)   SpO2 100%   BMI 32.62 kg/m  Wt Readings from Last 3 Encounters:  09/08/23 196 lb (88.9 kg)  09/07/23 194 lb 9.6 oz (88.3 kg)  08/31/23 201 lb (91.2 kg)    Lab Results  Component Value Date   TSH 1.540 04/19/2022   Lab Results  Component Value Date   WBC 4.8 09/04/2023   HGB 8.9 (L) 09/04/2023   HCT 29.9 (L) 09/04/2023   MCV 80.4 09/04/2023   PLT 359 09/04/2023   Lab Results  Component Value Date   NA 136 09/04/2023   K 3.5 09/04/2023   CO2 21 (L) 09/04/2023   GLUCOSE 100 (H) 09/04/2023   BUN 8 09/04/2023   CREATININE 1.03 (H) 09/04/2023   BILITOT 0.6 08/31/2023   ALKPHOS 61 08/31/2023   AST 19 08/31/2023   ALT 14 08/31/2023   PROT 8.6 (H) 08/31/2023   ALBUMIN 3.8 08/31/2023   CALCIUM  8.8 (L) 09/04/2023   ANIONGAP 9 09/04/2023   EGFR 59 (L) 07/21/2023   Lab Results  Component Value Date   CHOL 127 07/21/2023   Lab Results  Component Value Date   HDL 30 (L) 07/21/2023   Lab Results  Component Value Date   LDLCALC 70 07/21/2023   Lab Results  Component Value Date   TRIG 155 (H) 07/21/2023   Lab Results  Component Value Date   CHOLHDL 4.2 07/21/2023   Lab Results  Component  Value Date   HGBA1C 5.7 (A) 09/08/2023      Assessment & Plan:   Problem List Items Addressed This Visit       Cardiovascular and Mediastinum   Essential hypertension   BP Readings from Last 3 Encounters:  09/08/23 (!) 142/70  09/07/23 (!) 150/84  09/04/23 (!) 141/75  Blood pressure goal is less than 130/80 Metoprolol  50 mg daily, has been refilled, Discussed DASH diet and dietary sodium restrictions Continue to increase dietary efforts and exercise.           Digestive   Acute diverticulitis   Continue Augmentin  1 tablet twice daily Maintain close follow-up with GI      Relevant Orders   CBC   Basic Metabolic Panel     Endocrine   Non-insulin  dependent type 2 diabetes mellitus (HCC) - Primary   Lab Results  Component Value Date   HGBA1C 5.7 (A) 09/08/2023  Currently well-controlled on metformin  500 mg twice daily Continue current medication, up-to-date with diabetes eye exam       Relevant Orders   POCT glycosylated hemoglobin (Hb A1C) (Completed)  Genitourinary   Cancer of left kidney (HCC)   Followed by urology      Relevant Medications   fluconazole  (DIFLUCAN ) 150 MG tablet   Acute vaginitis   Fluconazole  50 mg one-time dose ordered Not able to obtain a vaginal swab today because she is on her menses      Relevant Medications   fluconazole  (DIFLUCAN ) 150 MG tablet     Other   Iron deficiency anemia   Lab Results  Component Value Date   WBC 4.8 09/04/2023   HGB 8.9 (L) 09/04/2023   HCT 29.9 (L) 09/04/2023   MCV 80.4 09/04/2023   PLT 359 09/04/2023   Lab Results  Component Value Date   IRON 8 (LL) 07/21/2023   TIBC 333 07/21/2023   FERRITIN 100 07/21/2023  Rechecking CBC With restart iron pills on 09/11/2023      Dyslipidemia, goal LDL below 70   Lab Results  Component Value Date   CHOL 127 07/21/2023   HDL 30 (L) 07/21/2023   LDLCALC 70 07/21/2023   LDLDIRECT 71 10/28/2022   TRIG 155 (H) 07/21/2023   CHOLHDL 4.2 07/21/2023   Continue atorvastatin  10 mg daily Lipid panel at next visit       Meds ordered this encounter  Medications   fluconazole  (DIFLUCAN ) 150 MG tablet    Sig: Take 1 tablet (150 mg total) by mouth once for 1 dose.    Dispense:  1 tablet    Refill:  0    Follow-up: Return in about 4 months (around 01/08/2024) for CPE.    Addilynn Mowrer R Carey Johndrow, FNP

## 2023-09-08 NOTE — Patient Instructions (Signed)
 Goal for fasting blood sugar ranges from 80 to 120 and 2 hours after any meal or at bedtime should be between 130 to 170.   It is important that you exercise regularly at least 30 minutes 5 times a week as tolerated  Think about what you will eat, plan ahead. Choose " clean, green, fresh or frozen" over canned, processed or packaged foods which are more sugary, salty and fatty. 70 to 75% of food eaten should be vegetables and fruit. Three meals at set times with snacks allowed between meals, but they must be fruit or vegetables. Aim to eat over a 12 hour period , example 7 am to 7 pm, and STOP after  your last meal of the day. Drink water,generally about 64 ounces per day, no other drink is as healthy. Fruit juice is best enjoyed in a healthy way, by EATING the fruit.  Thanks for choosing [facility] we consider it a privelige to serve you.

## 2023-09-08 NOTE — Assessment & Plan Note (Addendum)
 Lab Results  Component Value Date   WBC 4.8 09/04/2023   HGB 8.9 (L) 09/04/2023   HCT 29.9 (L) 09/04/2023   MCV 80.4 09/04/2023   PLT 359 09/04/2023   Lab Results  Component Value Date   IRON 8 (LL) 07/21/2023   TIBC 333 07/21/2023   FERRITIN 100 07/21/2023  Rechecking CBC With restart iron pills on 09/11/2023

## 2023-09-12 DIAGNOSIS — C642 Malignant neoplasm of left kidney, except renal pelvis: Secondary | ICD-10-CM | POA: Diagnosis not present

## 2023-09-13 ENCOUNTER — Telehealth: Payer: Self-pay | Admitting: Gastroenterology

## 2023-09-13 NOTE — Telephone Encounter (Signed)
 Patient requesting medication refill for oxycodone . Please advise.   Thank you

## 2023-09-13 NOTE — Telephone Encounter (Signed)
 The pt has  been advised that she needs to contact the prescriber of the medication for refills.  The pt has been advised of the information and verbalized understanding.

## 2023-09-13 NOTE — Telephone Encounter (Signed)
 Kaylee Maxwell -we have not seen this patient in over 6 months. Dr Wilhelmenia did not prescribe pain medication. If patient is having symptoms then this needs to go to Triage or patient needs to contact prescribing MD.

## 2023-09-14 ENCOUNTER — Encounter (HOSPITAL_BASED_OUTPATIENT_CLINIC_OR_DEPARTMENT_OTHER): Payer: Self-pay | Admitting: Emergency Medicine

## 2023-09-14 ENCOUNTER — Other Ambulatory Visit: Payer: Self-pay

## 2023-09-14 ENCOUNTER — Emergency Department (HOSPITAL_BASED_OUTPATIENT_CLINIC_OR_DEPARTMENT_OTHER)

## 2023-09-14 ENCOUNTER — Emergency Department (HOSPITAL_BASED_OUTPATIENT_CLINIC_OR_DEPARTMENT_OTHER)
Admission: EM | Admit: 2023-09-14 | Discharge: 2023-09-14 | Disposition: A | Discharge status: Home / Self Care | Attending: Emergency Medicine | Admitting: Emergency Medicine

## 2023-09-14 DIAGNOSIS — Z905 Acquired absence of kidney: Secondary | ICD-10-CM | POA: Diagnosis not present

## 2023-09-14 DIAGNOSIS — R1031 Right lower quadrant pain: Secondary | ICD-10-CM | POA: Diagnosis not present

## 2023-09-14 DIAGNOSIS — Z79899 Other long term (current) drug therapy: Secondary | ICD-10-CM | POA: Diagnosis not present

## 2023-09-14 DIAGNOSIS — E119 Type 2 diabetes mellitus without complications: Secondary | ICD-10-CM | POA: Diagnosis not present

## 2023-09-14 DIAGNOSIS — Z87891 Personal history of nicotine dependence: Secondary | ICD-10-CM | POA: Diagnosis not present

## 2023-09-14 DIAGNOSIS — R109 Unspecified abdominal pain: Secondary | ICD-10-CM | POA: Diagnosis present

## 2023-09-14 DIAGNOSIS — K5732 Diverticulitis of large intestine without perforation or abscess without bleeding: Secondary | ICD-10-CM | POA: Diagnosis not present

## 2023-09-14 DIAGNOSIS — Z7984 Long term (current) use of oral hypoglycemic drugs: Secondary | ICD-10-CM | POA: Insufficient documentation

## 2023-09-14 DIAGNOSIS — Z7982 Long term (current) use of aspirin: Secondary | ICD-10-CM | POA: Insufficient documentation

## 2023-09-14 DIAGNOSIS — R7989 Other specified abnormal findings of blood chemistry: Secondary | ICD-10-CM | POA: Diagnosis not present

## 2023-09-14 DIAGNOSIS — I10 Essential (primary) hypertension: Secondary | ICD-10-CM | POA: Insufficient documentation

## 2023-09-14 DIAGNOSIS — K921 Melena: Secondary | ICD-10-CM | POA: Insufficient documentation

## 2023-09-14 DIAGNOSIS — K5792 Diverticulitis of intestine, part unspecified, without perforation or abscess without bleeding: Secondary | ICD-10-CM | POA: Diagnosis not present

## 2023-09-14 DIAGNOSIS — K838 Other specified diseases of biliary tract: Secondary | ICD-10-CM | POA: Diagnosis not present

## 2023-09-14 DIAGNOSIS — K862 Cyst of pancreas: Secondary | ICD-10-CM | POA: Diagnosis not present

## 2023-09-14 LAB — URINALYSIS, ROUTINE W REFLEX MICROSCOPIC
Bacteria, UA: NONE SEEN
Bilirubin Urine: NEGATIVE
Glucose, UA: NEGATIVE mg/dL
Ketones, ur: NEGATIVE mg/dL
Leukocytes,Ua: NEGATIVE
Nitrite: NEGATIVE
Protein, ur: 30 mg/dL — AB
Specific Gravity, Urine: 1.019 (ref 1.005–1.030)
pH: 6 (ref 5.0–8.0)

## 2023-09-14 LAB — CBC WITH DIFFERENTIAL/PLATELET
Abs Immature Granulocytes: 0.01 K/uL (ref 0.00–0.07)
Basophils Absolute: 0 K/uL (ref 0.0–0.1)
Basophils Relative: 0 %
Eosinophils Absolute: 0.2 K/uL (ref 0.0–0.5)
Eosinophils Relative: 4 %
HCT: 33.2 % — ABNORMAL LOW (ref 36.0–46.0)
Hemoglobin: 10 g/dL — ABNORMAL LOW (ref 12.0–15.0)
Immature Granulocytes: 0 %
Lymphocytes Relative: 29 %
Lymphs Abs: 1.3 K/uL (ref 0.7–4.0)
MCH: 23.5 pg — ABNORMAL LOW (ref 26.0–34.0)
MCHC: 30.1 g/dL (ref 30.0–36.0)
MCV: 78.1 fL — ABNORMAL LOW (ref 80.0–100.0)
Monocytes Absolute: 0.8 K/uL (ref 0.1–1.0)
Monocytes Relative: 17 %
Neutro Abs: 2.2 K/uL (ref 1.7–7.7)
Neutrophils Relative %: 50 %
Platelets: 474 K/uL — ABNORMAL HIGH (ref 150–400)
RBC: 4.25 MIL/uL (ref 3.87–5.11)
RDW: 17.2 % — ABNORMAL HIGH (ref 11.5–15.5)
WBC: 4.4 K/uL (ref 4.0–10.5)
nRBC: 0 % (ref 0.0–0.2)

## 2023-09-14 LAB — COMPREHENSIVE METABOLIC PANEL WITH GFR
ALT: 15 U/L (ref 0–44)
AST: 25 U/L (ref 15–41)
Albumin: 4.2 g/dL (ref 3.5–5.0)
Alkaline Phosphatase: 85 U/L (ref 38–126)
Anion gap: 16 — ABNORMAL HIGH (ref 5–15)
BUN: 8 mg/dL (ref 6–20)
CO2: 22 mmol/L (ref 22–32)
Calcium: 9.7 mg/dL (ref 8.9–10.3)
Chloride: 100 mmol/L (ref 98–111)
Creatinine, Ser: 0.93 mg/dL (ref 0.44–1.00)
GFR, Estimated: >60 mL/min (ref >60)
Glucose, Bld: 102 mg/dL — ABNORMAL HIGH (ref 70–99)
Potassium: 3.3 mmol/L — ABNORMAL LOW (ref 3.5–5.1)
Sodium: 137 mmol/L (ref 135–145)
Total Bilirubin: 0.3 mg/dL (ref 0.0–1.2)
Total Protein: 8.6 g/dL — ABNORMAL HIGH (ref 6.5–8.1)

## 2023-09-14 LAB — PREGNANCY, URINE: Preg Test, Ur: NEGATIVE

## 2023-09-14 LAB — LIPASE, BLOOD: Lipase: 73 U/L — ABNORMAL HIGH (ref 11–51)

## 2023-09-14 MED ORDER — IOHEXOL 300 MG/ML  SOLN
100.0000 mL | Freq: Once | INTRAMUSCULAR | Status: AC | PRN
  Administered 2023-09-14: 100 mL via INTRAVENOUS

## 2023-09-14 MED ORDER — ONDANSETRON HCL 4 MG/2ML IJ SOLN: 4.0000 mg | Freq: Once | INTRAMUSCULAR | Status: DC

## 2023-09-14 MED ORDER — AMOXICILLIN-POT CLAVULANATE 875-125 MG PO TABS: 1.0000 | ORAL_TABLET | Freq: Two times a day (BID) | ORAL | 0 refills | Status: AC

## 2023-09-14 MED ORDER — HYDROCODONE-ACETAMINOPHEN 5-325 MG PO TABS: 1.0000 | ORAL_TABLET | Freq: Four times a day (QID) | ORAL | 0 refills | Status: DC | PRN

## 2023-09-14 MED ORDER — ONDANSETRON 4 MG PO TBDP: 4.0000 mg | ORAL_TABLET | Freq: Three times a day (TID) | ORAL | 0 refills | Status: DC | PRN

## 2023-09-14 MED ORDER — HYDROMORPHONE HCL 1 MG/ML IJ SOLN
1.0000 mg | Freq: Once | INTRAMUSCULAR | Status: AC
  Administered 2023-09-14: 1 mg via INTRAVENOUS
  Filled 2023-09-14: qty 1

## 2023-09-14 NOTE — ED Triage Notes (Signed)
 Abdominal pain Treated for diverticulitis recently Blood in toilet

## 2023-09-14 NOTE — ED Provider Notes (Addendum)
 [place] EMERGENCY DEPARTMENT AT [facility] Provider Note   CSN: [id number] Arrival date & time: 09/14/23  1625     Patient presents with: Abdominal Pain   Kaylee Maxwell is a 48 y.o. female.   Patient has had 2 recent admissions for diverticulitis.  The most recent one was July 24 through July 28.  Sigmoid diverticulitis.  Patient was discharged on Augmentin .  Still taking that she will conclude that on August 10.  Patient states she still had some discomfort when she was discharged.  Has not really gotten better maybe a little bit worse.  She saw some red blood with wiping and in the toilet water  but not turning total water  all red yesterday and today.  Patient does have follow-up with gastroenterology for the first time at the end of the month.  Patient states the pain is mostly right-sided at this time.  Past medical history doing for hypertension mitral valve regurg type 2 diabetes patient is a former smoker of cigarettes.  Patient seen and admitted July 1 and July 24 for diverticulitis.  No fevers.  No nausea vomiting.       Prior to Admission medications   Medication Sig Start Date End Date Taking? Authorizing Provider  acetaminophen  (TYLENOL ) 325 MG tablet Take 2 tablets (650 mg total) by mouth every 6 (six) hours as needed for mild pain (pain score 1-3), fever or moderate pain (pain score 4-6) (or Fever >/= 101). 09/03/23   Hongalgi, Anand D, MD  amoxicillin -clavulanate (AUGMENTIN ) 875-125 MG tablet Take 1 tablet by mouth 2 (two) times daily for 14 days. 09/03/23 09/17/23  Hongalgi, Anand D, MD  aspirin  EC 81 MG tablet Take 81 mg by mouth in the morning. Swallow whole.    [provider]  atorvastatin  (LIPITOR) 10 MG tablet Take 1 tablet (10 mg total) by mouth daily. 06/06/23   Paseda, [name], FNP  ferrous sulfate  325 (65 FE) MG EC tablet Take 1 tablet (325 mg total) by mouth daily. 06/13/23   Paseda, [name], FNP  glucose blood (GNP TRUE METRIX GLUCOSE  STRIPS) test strip Use as instructed Patient not taking: Reported on 09/08/2023 04/19/22   Paseda, [name], FNP  Lancets (FREESTYLE) lancets Use as directed Patient not taking: Reported on 09/08/2023 04/19/22   Paseda, [name], FNP  metFORMIN  (GLUCOPHAGE -XR) 500 MG 24 hr tablet Take 500 mg with breakfast daily by mouth  for one week , then increase to 500 mg by mouth twice daily with meal 06/07/23   Paseda, [name], FNP  metoprolol  succinate (TOPROL -XL) 50 MG 24 hr tablet Take 1 tablet (50 mg total) by mouth daily. Take with or immediately following a meal. Please schedule follow up appt for more refills Patient not taking: Reported on 09/08/2023 09/07/23   Wyn Jackee VEAR Mickey., NP  ondansetron  (ZOFRAN -ODT) 4 MG disintegrating tablet Take 4 mg by mouth daily as needed for nausea or vomiting. Patient not taking: Reported on 09/08/2023    [provider]  oxyCODONE  (OXY IR/ROXICODONE ) 5 MG immediate release tablet Take 1 tablet (5 mg total) by mouth every 4 (four) hours as needed for severe pain (pain score 7-10). 08/22/23   Lonn Hicks, MD  pantoprazole  (PROTONIX ) 40 MG tablet Take 40 mg by mouth 2 (two) times daily.    [provider]  traZODone  (DESYREL ) 50 MG tablet Take 1 tablet (50 mg total) by mouth at bedtime as needed for sleep 05/03/23   Paseda, [name], FNP  cetirizine  (ZYRTEC  ALLERGY) 10 MG tablet Take 1 tablet (10 mg total) by mouth daily. 03/04/20 04/20/20  Christopher Savannah, PA-C    Allergies: Ozempic  (0.25 or 0.5 mg-dose) [semaglutide (0.25 or 0.5mg -dos)]    Review of Systems  Constitutional:  Negative for chills and fever.  HENT:  Negative for ear pain and sore throat.   Eyes:  Negative for pain and visual disturbance.  Respiratory:  Negative for cough and shortness of breath.   Cardiovascular:  Negative for chest pain and palpitations.  Gastrointestinal:  Positive for abdominal pain and blood in stool. Negative for diarrhea, nausea and vomiting.  Genitourinary:   Negative for dysuria and hematuria.  Musculoskeletal:  Negative for arthralgias and back pain.  Skin:  Negative for color change and rash.  Neurological:  Negative for seizures and syncope.  All other systems reviewed and are negative.   Updated Vital Signs BP (!) 156/93   Pulse 84   Temp 98 F (36.7 C) (Oral)   Resp 18   LMP 09/06/2023   SpO2 100%   Physical Exam Vitals and nursing note reviewed.  Constitutional:      General: She is not in acute distress.    Appearance: Normal appearance. She is well-developed.  HENT:     Head: Normocephalic and atraumatic.     Mouth/Throat:     Mouth: Mucous membranes are moist.  Eyes:     Extraocular Movements: Extraocular movements intact.     Conjunctiva/sclera: Conjunctivae normal.     Pupils: Pupils are equal, round, and reactive to light.  Cardiovascular:     Rate and Rhythm: Normal rate and regular rhythm.     Heart sounds: No murmur heard. Pulmonary:     Effort: Pulmonary effort is normal. No respiratory distress.     Breath sounds: Normal breath sounds.  Abdominal:     Palpations: Abdomen is soft.     Tenderness: There is no abdominal tenderness. There is no guarding.     Comments: Abdomen soft and nontender.  Musculoskeletal:        General: No swelling.     Cervical back: Normal range of motion and neck supple.  Skin:    General: Skin is warm and dry.     Capillary Refill: Capillary refill takes less than 2 seconds.  Neurological:     General: No focal deficit present.     Mental Status: She is alert and oriented to person, place, and time.  Psychiatric:        Mood and Affect: Mood normal.     (all labs ordered are listed, but only abnormal results are displayed) Labs Reviewed  CBC WITH DIFFERENTIAL/PLATELET  COMPREHENSIVE METABOLIC PANEL WITH GFR  LIPASE, BLOOD  URINALYSIS, ROUTINE W REFLEX MICROSCOPIC  PREGNANCY, URINE    EKG: None  Radiology: No results found.   Procedures   Medications Ordered  in the ED - No data to display                                  Medical Decision Making Amount and/or Complexity of Data Reviewed Radiology: ordered.  Risk Prescription drug management.   Patient's vital signs here are reassuring temp 98 respiration 18, blood pressure is 156 systolic pulse is 84.  Patient with known history of diverticulitis still taking Augmentin .  Will get CT scan abdomen pelvis.  Will check labs CBC complete metabolic panel lipase.  Patient may require admission for  persistent diverticulitis despite outpatient antibiotics.  Patient was just a small amount of red blood per rectum yesterday and today.  Not staying toilet water  red.  May not require inpatient admission for this.  May be able to follow-up as an outpatient for just the blood.  However the diverticulitis may be lead to readmission.  CBC white count 4.4 reassuring.  Hemoglobin 10 higher hemoglobin than when she was in the hospital.  Platelets are 474.  No significant change in her hemoglobin.  Complete metabolic panel potassium 3.3 LFTs normal renal function normal.  Lipase elevated little bit at 73.  Will see what the CT scan shows.  Urinalysis is negative.  Pregnancy test is negative.  CT scan abdomen pelvis persistent but improving uncomplicated sigmoid diverticulitis no perforation or abscess.  Post surgical changes from partial nephrectomy lower pole left kidney stable nonspecific and bile duct dilatation no evidence of cholelithiasis or choledocholithiasis.  In particular pancreas has a stable 5 mm cyst pancreatic head no evidence of any ductal dilatation or any inflammatory changes.  Patient has through October 10 on the Augmentin .  CT scan is showing improvement.  No complication is not worsening.  Has had some GI blood.  But hemodynamically very stable.  Patient does have follow-up with gastroenterology in 2 weeks.  Patient probably stable for discharge home.  If the Augmentin  is due to finish up in  the next couple days may extend it for another 7 days.  Will have her return for any worsening of the bleeding.  Precautions provided.  Patient seems to be feeling much better with pain medicine as well as antinausea medicine.  So may need a prescription for that.      Final diagnoses:  Right lower quadrant abdominal pain  Blood in stool    ED Discharge Orders     None          Geraldene Hamilton, MD 09/14/23 1725    Geraldene Hamilton, MD 09/14/23 1901

## 2023-09-14 NOTE — Discharge Instructions (Addendum)
 Follow-up with your GI doctor as directed.  Take the hydrocodone  as directed for pain.  Take the Zofran  as needed for nausea vomiting.  Continue to take the Augmentin  for another week.  Return for bleeding that stains adult water  all red x 2 in a day.  Or for new or worse symptoms.

## 2023-09-19 ENCOUNTER — Other Ambulatory Visit (HOSPITAL_COMMUNITY): Payer: Self-pay

## 2023-09-19 ENCOUNTER — Other Ambulatory Visit: Payer: Self-pay

## 2023-09-25 ENCOUNTER — Telehealth: Payer: Self-pay

## 2023-09-25 NOTE — Telephone Encounter (Signed)
 Copied from CRM #[id number]. Topic: General - Other >> Sep 22, 2023 11:52 AM Travis F wrote: Reason for CRM: Patient is calling in because she says FMLA paperwork was sent over to the office on 09/18/23 and she wanted to know the status. Please follow up with patient.  Pt was called and forms were placed in [place] folder. KH

## 2023-09-26 ENCOUNTER — Other Ambulatory Visit: Payer: Self-pay

## 2023-09-26 ENCOUNTER — Telehealth: Payer: Self-pay

## 2023-09-26 NOTE — Transitions of Care (Post Inpatient/ED Visit) (Signed)
 09/26/2023  Name: Kaylee Maxwell MRN: [id number] DOB: [birth year]  Today's TOC FU Call Status:   Patient's Name and Date of Birth confirmed.  Transition Care Management Follow-up Telephone Call Date of Discharge: 09/14/23 Discharge Facility: [facility] ([facility]) Type of Discharge: Emergency Department How have you been since you were released from the hospital?: Better Any questions or concerns?: No  Items Reviewed: Did you receive and understand the discharge instructions provided?: Yes Medications obtained,verified, and reconciled?: Yes (Medications Reviewed) Any new allergies since your discharge?: No Dietary orders reviewed?: NA Do you have support at home?: Yes People in Home [RPT]: child(ren), adult  Medications Reviewed Today: Medications Reviewed Today     Reviewed by Starlene Charlynn BIRCH, CMA (Certified Medical Assistant) on 09/26/23 at 0944  Med List Status: <None>   Medication Order Taking? Sig Documenting Provider Last Dose Status Informant  acetaminophen  (TYLENOL ) 325 MG tablet [id number] Yes Take 2 tablets (650 mg total) by mouth every 6 (six) hours as needed for mild pain (pain score 1-3), fever or moderate pain (pain score 4-6) (or Fever >/= 101). Hongalgi, Anand D, MD  Active   aspirin  EC 81 MG tablet [id number] Yes Take 81 mg by mouth in the morning. Swallow whole. [provider]  Active Self, Pharmacy Records  atorvastatin  (LIPITOR) 10 MG tablet [id number] Yes Take 1 tablet (10 mg total) by mouth daily. Paseda, [name], FNP  Active Self, Pharmacy Records    Discontinued 04/20/20 [phone] (Completed Course)   ferrous sulfate  325 (65 FE) MG EC tablet [id number] Yes Take 1 tablet (325 mg total) by mouth daily. Paseda, [name], FNP  Active Self, Pharmacy Records  glucose blood (GNP TRUE METRIX GLUCOSE STRIPS) test strip [id number]  Use as instructed  Patient not taking: Reported on 09/08/2023   Paseda, [name], FNP  Active Self, Pharmacy Records   HYDROcodone -acetaminophen  (NORCO/VICODIN) 5-325 MG tablet [id number] Yes Take 1 tablet by mouth every 6 (six) hours as needed. Myriam Dorn BROCKS, PA  Active   Lancets (FREESTYLE) lancets [id number]  Use as directed  Patient not taking: Reported on 09/08/2023   Paseda, [name], FNP  Active Self, Pharmacy Records  metFORMIN  (GLUCOPHAGE -XR) 500 MG 24 hr tablet [id number] Yes Take 500 mg with breakfast daily by mouth  for one week , then increase to 500 mg by mouth twice daily with meal Paseda, [name], FNP  Active Self, Pharmacy Records  metoprolol  succinate (TOPROL -XL) 50 MG 24 hr tablet [id number]  Take 1 tablet (50 mg total) by mouth daily. Take with or immediately following a meal. Please schedule follow up appt for more refills  Patient not taking: Reported on 09/08/2023   Wyn Jackee VEAR Mickey., NP  Active   ondansetron  (ZOFRAN -ODT) 4 MG disintegrating tablet [id number]  Take 4 mg by mouth daily as needed for nausea or vomiting.  Patient not taking: Reported on 09/08/2023   [provider]  Active Self, Pharmacy Records  ondansetron  (ZOFRAN -ODT) 4 MG disintegrating tablet [id number] Yes Take 1 tablet (4 mg total) by mouth every 8 (eight) hours as needed for nausea or vomiting. Zackowski, Scott, MD  Active   oxyCODONE  (OXY IR/ROXICODONE ) 5 MG immediate release tablet [id number] Yes Take 1 tablet (5 mg total) by mouth every 4 (four) hours as needed for severe pain (pain score 7-10). Lonn Hicks, MD  Active Self, Pharmacy Records  pantoprazole  (PROTONIX ) 40 MG tablet [id number] Yes Take 40 mg by mouth 2 (two) times daily. [provider]  Active Self, Pharmacy  Records  traZODone  (DESYREL ) 50 MG tablet [id number] Yes Take 1 tablet (50 mg total) by mouth at bedtime as needed for sleep Paseda, [name], FNP  Active Self, Pharmacy Records            Home Care and Equipment/Supplies: Were Home Health Services Ordered?: NA Any new equipment or medical supplies ordered?:  NA  Functional Questionnaire: Do you need assistance with bathing/showering or dressing?: No Do you need assistance with meal preparation?: No Do you need assistance with eating?: No Do you have difficulty maintaining continence: No Do you need assistance with getting out of bed/getting out of a chair/moving?: No Do you have difficulty managing or taking your medications?: No  Follow up appointments reviewed: PCP Follow-up appointment confirmed?: Yes Specialist Hospital Follow-up appointment confirmed?: Yes Date of Specialist follow-up appointment?: 10/03/23 Do you need transportation to your follow-up appointment?: No Do you understand care options if your condition(s) worsen?: Yes-patient verbalized understanding    SIGNATURE Adis Sturgill, RMA

## 2023-10-02 NOTE — Progress Notes (Unsigned)
 MEREDYTH Maxwell [id number] 11-Oct-1975   Chief Complaint:  Referring Provider: Paseda, [name], FNP Primary GI MD: Dr. Wilhelmenia  HPI: Kaylee Maxwell is a 48 y.o. female with past medical history of HTN, HLD, T2DM, renal mass/RCC s/p left partial nephrectomy 07/04/2023, IDA, palpitations/PVCs, gastritis/duodenitis, anxiety, diverticulitis who presents today for a complaint of *** .    Patient hospitalized 08/08/2023 to 08/10/2023 for acute diverticulitis with small abscess not amenable to IR drainage, treated with IV Zosyn  while hospitalized and completed a 7-day course of Cipro /Flagyl  on discharge.  Seen by oncology on 08/22/2023 and had repeat CT/PE 08/29/2023 that showed persistent sigmoid colonic diverticulitis with fluid in the pelvis/sigmoid mesentery and inflamed colonic diverticulum, small focus of gas in the sigmoid mesentery measuring 18 mm in maximum dimension concerning for perforation.  Patient admitted to [facility]/20[facility] to 09/04/2023 for persistent mild diverticulitis.  Repeat CT A/P showed persistent diverticulitis but without evidence of perforation or abscess.  GI was consulted.  No role for surgical intervention or endoscopy during admission.  Recommendation was for outpatient follow-up for interim colonoscopy. Noted to have iron deficiency anemia with hemoglobin mostly in the 8 g range.  Recommended to follow CBC as outpatient, hold iron supplements for a week following discharge to avoid constipation while recovering from acute diverticulitis.  09/14/2023 patient seen in the ED with complaint of abdominal pain.  Reported some red blood when wiping as well as in the toilet.  Pain mostly right-sided.  CBC with white count of 4.4, hemoglobin 10, platelets 474, potassium 3.3, normal LFTs and renal function.  Lipase mildly elevated at 73.  Negative pregnancy test.  Negative urinalysis.  CT A/P shows persistent but improving uncomplicated sigmoid diverticulitis  without perforation or abscess.  Stable 5 mm cyst of the pancreatic head without any ductal dilatation or inflammatory changes.  Felt better with pain medication and antiemetics.  Deemed stable for discharge.  No prior colonoscopy.  Previous GI Procedures/Imaging   CT A/P 09/14/2023 1. Persistent but improving uncomplicated sigmoid diverticulitis. No perforation or abscess. 2. Postsurgical changes from partial nephrectomy lower pole left kidney, with decreasing postoperative fluid collection. 3. Stable nonspecific common bile duct dilation. No evidence of cholelithiasis or choledocholithiasis.  Upper EUS 02/02/2023 EGD impression:  - No gross lesions in the entire esophagus. Z- line irregular, 37 cm from the incisors.  - 2 cm hiatal hernia.  - Erythematous mucosa and erosions in the distal gastric body, antrum and prepyloric region of the stomach. No other gross lesions in the entire stomach. Biopsied.  - Erythematous duodenopathy noted in the bulb/ sweep/ D2. Biopsied.  - Non- bleeding duodenal diverticulum found suprapapillary. Normal major papilla.  EUS impression:  - There was dilation in the common bile duct and in the common hepatic duct. No evidence of choledocholithiasis or biliary stricture noted. Regular contour was noted throughout.  - There was no sign of significant pathology in the gallbladder ( i. e. no evidence of cholelithiasis/ microlithiasis) .  - A single pancreatic parenchymal hyperechoic foci was noted in the pancreatic head. The rest of the pancreatic parenchyma consisted of lobularity without honeycombing throughout the entire pancreas.  - Main pancreatic duct (MPD) diameter was measured. Endosonographically, the MPD had a normal appearance.  - No intramural ampullary lesion noted.  - No malignant- appearing lymph nodes were visualized in the celiac region ( level 20) , peripancreatic region and porta hepatis region.   Past Medical History:  Diagnosis Date  Anxiety 03/04/2018   Breast cancer screening 03/04/2018   Heart palpitations 03/04/2018   Monitor 9/21: normal sinus rhythm, sinus tachycardia, Avg HR 110; occ PVCs   Hypertension    Iron deficiency    Irregular heartbeat 03/04/2018   Mild mitral regurgitation    Obesity    Osteochondroma    PVC's (premature ventricular contractions)    Scalp mass 10/09/2012   Sinus tachycardia    Type 2 diabetes mellitus [facility])     Past Surgical History:  Procedure Laterality Date   ANKLE FRACTURE SURGERY Right    BIOPSY  02/02/2023   Procedure: BIOPSY;  Surgeon: Wilhelmenia Aloha Raddle., MD;  Location: [facility];  Service: Gastroenterology;;   ROMAYNE Right    cyst removal from scalp     ESOPHAGOGASTRODUODENOSCOPY N/A 02/02/2023   Procedure: ESOPHAGOGASTRODUODENOSCOPY (EGD);  Surgeon: Wilhelmenia Aloha Raddle., MD;  Location: [facility];  Service: Gastroenterology;  Laterality: N/A;   EUS N/A 02/02/2023   Procedure: UPPER ENDOSCOPIC ULTRASOUND (EUS) RADIAL;  Surgeon: Wilhelmenia Aloha Raddle., MD;  Location: [facility];  Service: Gastroenterology;  Laterality: N/A;   left leg bone removal     ROBOTIC ASSITED PARTIAL NEPHRECTOMY Left 07/04/2023   Procedure: XI ROBOTIC ASSITED LEFT  PARTIAL NEPHRECTOMY;  Surgeon: Devere Lonni Righter, MD;  Location: [facility];  Service: Urology;  Laterality: Left;  180 MINUTES    Current Outpatient Medications  Medication Sig Dispense Refill   acetaminophen  (TYLENOL ) 325 MG tablet Take 2 tablets (650 mg total) by mouth every 6 (six) hours as needed for mild pain (pain score 1-3), fever or moderate pain (pain score 4-6) (or Fever >/= 101).     aspirin  EC 81 MG tablet Take 81 mg by mouth in the morning. Swallow whole.     atorvastatin  (LIPITOR) 10 MG tablet Take 1 tablet (10 mg total) by mouth daily. 90 tablet 1   ferrous sulfate  325 (65 FE) MG EC tablet Take 1 tablet (325 mg total) by mouth daily. 90 tablet 1   glucose blood (GNP TRUE METRIX GLUCOSE  STRIPS) test strip Use as instructed (Patient not taking: Reported on 09/08/2023) 100 each 12   HYDROcodone -acetaminophen  (NORCO/VICODIN) 5-325 MG tablet Take 1 tablet by mouth every 6 (six) hours as needed. 14 tablet 0   Lancets (FREESTYLE) lancets Use as directed (Patient not taking: Reported on 09/08/2023) 100 each 12   metFORMIN  (GLUCOPHAGE -XR) 500 MG 24 hr tablet Take 500 mg with breakfast daily by mouth  for one week , then increase to 500 mg by mouth twice daily with meal 180 tablet 1   metoprolol  succinate (TOPROL -XL) 50 MG 24 hr tablet Take 1 tablet (50 mg total) by mouth daily. Take with or immediately following a meal. Please schedule follow up appt for more refills (Patient not taking: Reported on 09/08/2023) 90 tablet 3   ondansetron  (ZOFRAN -ODT) 4 MG disintegrating tablet Take 4 mg by mouth daily as needed for nausea or vomiting. (Patient not taking: Reported on 09/08/2023)     ondansetron  (ZOFRAN -ODT) 4 MG disintegrating tablet Take 1 tablet (4 mg total) by mouth every 8 (eight) hours as needed for nausea or vomiting. 20 tablet 0   oxyCODONE  (OXY IR/ROXICODONE ) 5 MG immediate release tablet Take 1 tablet (5 mg total) by mouth every 4 (four) hours as needed for severe pain (pain score 7-10). 60 tablet 0   pantoprazole  (PROTONIX ) 40 MG tablet Take 40 mg by mouth 2 (two) times daily.     traZODone  (DESYREL ) 50 MG tablet Take  1 tablet (50 mg total) by mouth at bedtime as needed for sleep 90 tablet 1   No current facility-administered medications for this visit.    Allergies as of 10/03/2023 - Review Complete 09/26/2023  Allergen Reaction Noted   Ozempic  (0.25 or 0.5 mg-dose) [semaglutide (0.25 or 0.5mg -dos)] Other (See Comments) 06/06/2023    Family History  Problem Relation Age of Onset   Diabetes Mother    Hypertension Mother    Obesity Mother    Hypertension Father    Cancer Paternal Aunt        pancreatic   Cancer Maternal Grandmother        lung   Autism spectrum disorder Sister      Social History   Tobacco Use   Smoking status: Former    Current packs/day: 0.50    Average packs/day: 0.5 packs/day for 18.0 years (9.0 ttl pk-yrs)    Types: Cigarettes    Passive exposure: Never   Smokeless tobacco: Never  Vaping Use   Vaping status: Every Day   Substances: Nicotine , Flavoring  Substance Use Topics   Alcohol use: Yes    Comment: occasional   Drug use: No     Review of Systems:    Constitutional: No weight loss, fever, chills, weakness or fatigue Eyes: No change in vision Ears, Nose, Throat:  No change in hearing or congestion Skin: No rash or itching Cardiovascular: No chest pain, chest pressure or palpitations   Respiratory: No SOB or cough Gastrointestinal: See HPI and otherwise negative Genitourinary: No dysuria or change in urinary frequency Neurological: No headache, dizziness or syncope Musculoskeletal: No new muscle or joint pain Hematologic: No bleeding or bruising    Physical Exam:  Vital signs: LMP 09/06/2023   Constitutional: NAD, Well developed, Well nourished, alert and cooperative Head:  Normocephalic and atraumatic.  Eyes: No scleral icterus. Conjunctiva pink. Mouth: No oral lesions. Respiratory: Respirations even and unlabored. Lungs clear to auscultation bilaterally.  No wheezes, crackles, or rhonchi.  Cardiovascular:  Regular rate and rhythm. No murmurs. No peripheral edema. Gastrointestinal:  Soft, nondistended, nontender. No rebound or guarding. Normal bowel sounds. No appreciable masses or hepatomegaly. Rectal:  Not performed.  Neurologic:  Alert and oriented x4;  grossly normal neurologically.  Skin:   Dry and intact without significant lesions or rashes. Psychiatric: Oriented to person, place and time. Demonstrates good judgement and reason without abnormal affect or behaviors.   RELEVANT LABS AND IMAGING: CBC    Component Value Date/Time   WBC 4.4 09/14/2023 1722   RBC 4.25 09/14/2023 1722   HGB 10.0 (L)  09/14/2023 1722   HGB 8.9 (L) 07/21/2023 1457   HCT 33.2 (L) 09/14/2023 1722   HCT 29.7 (L) 07/21/2023 1457   PLT 474 (H) 09/14/2023 1722   PLT 918 (HH) 07/21/2023 1457   MCV 78.1 (L) 09/14/2023 1722   MCV 77 (L) 07/21/2023 1457   MCH 23.5 (L) 09/14/2023 1722   MCHC 30.1 09/14/2023 1722   RDW 17.2 (H) 09/14/2023 1722   RDW 16.9 (H) 07/21/2023 1457   LYMPHSABS 1.3 09/14/2023 1722   LYMPHSABS 1.0 04/19/2022 0941   MONOABS 0.8 09/14/2023 1722   EOSABS 0.2 09/14/2023 1722   EOSABS 0.1 04/19/2022 0941   BASOSABS 0.0 09/14/2023 1722   BASOSABS 0.0 04/19/2022 0941    CMP     Component Value Date/Time   NA 137 09/14/2023 1722   NA 132 (L) 07/21/2023 1457   K 3.3 (L) 09/14/2023 1722   CL 100 09/14/2023  1722   CO2 22 09/14/2023 1722   GLUCOSE 102 (H) 09/14/2023 1722   BUN 8 09/14/2023 1722   BUN 12 07/21/2023 1457   CREATININE 0.93 09/14/2023 1722   CREATININE 0.79 08/22/2023 1443   CREATININE 1.18 (H) 06/03/2021 0903   CALCIUM  9.7 09/14/2023 1722   PROT 8.6 (H) 09/14/2023 1722   PROT 7.8 06/09/2023 1330   ALBUMIN 4.2 09/14/2023 1722   ALBUMIN 4.2 06/09/2023 1330   AST 25 09/14/2023 1722   AST 17 08/22/2023 1443   ALT 15 09/14/2023 1722   ALT 19 08/22/2023 1443   ALKPHOS 85 09/14/2023 1722   BILITOT 0.3 09/14/2023 1722   BILITOT 0.3 08/22/2023 1443   GFRNONAA >60 09/14/2023 1722   GFRNONAA >60 08/22/2023 1443   GFRAA 101 09/05/2019 1120   Echocardiogram 06/03/2021 1. Left ventricular ejection fraction, by estimation, is 55 to 60% . The left ventricle has normal function. The left ventricle has no regional wall motion abnormalities. Left ventricular diastolic parameters are consistent with Grade I diastolic dysfunction ( impaired relaxation) .  2. Right ventricular systolic function is normal. The right ventricular size is normal. Tricuspid regurgitation signal is inadequate for assessing PA pressure.  3. The mitral valve is normal in structure. Mild mitral valve  regurgitation. No evidence of mitral stenosis.  4. The aortic valve is tricuspid. Aortic valve regurgitation is not visualized. No aortic stenosis is present.  5. The inferior vena cava is normal in size with greater than 50% respiratory variability, suggesting right atrial pressure of 3 mmHg.  Assessment/Plan:   Eventually needs colonoscopy 6-8 weeks after resolution of diverticulitis.    Camie Furbish, PA-C [facility] 10/02/2023, 6:19 PM  Patient Care Team: Paseda, [name], FNP as PCP - General (Nurse Practitioner)

## 2023-10-03 ENCOUNTER — Other Ambulatory Visit: Payer: Self-pay

## 2023-10-03 ENCOUNTER — Encounter: Payer: Self-pay | Admitting: Gastroenterology

## 2023-10-03 ENCOUNTER — Other Ambulatory Visit (HOSPITAL_COMMUNITY): Payer: Self-pay

## 2023-10-03 ENCOUNTER — Other Ambulatory Visit (INDEPENDENT_AMBULATORY_CARE_PROVIDER_SITE_OTHER)

## 2023-10-03 ENCOUNTER — Ambulatory Visit: Admitting: Gastroenterology

## 2023-10-03 ENCOUNTER — Telehealth: Payer: Self-pay

## 2023-10-03 VITALS — BP 128/82 | HR 95 | Ht 65.0 in | Wt 198.0 lb

## 2023-10-03 DIAGNOSIS — K862 Cyst of pancreas: Secondary | ICD-10-CM

## 2023-10-03 DIAGNOSIS — Z8719 Personal history of other diseases of the digestive system: Secondary | ICD-10-CM | POA: Diagnosis not present

## 2023-10-03 DIAGNOSIS — K2970 Gastritis, unspecified, without bleeding: Secondary | ICD-10-CM

## 2023-10-03 DIAGNOSIS — K5792 Diverticulitis of intestine, part unspecified, without perforation or abscess without bleeding: Secondary | ICD-10-CM

## 2023-10-03 DIAGNOSIS — R1011 Right upper quadrant pain: Secondary | ICD-10-CM

## 2023-10-03 DIAGNOSIS — K625 Hemorrhage of anus and rectum: Secondary | ICD-10-CM

## 2023-10-03 DIAGNOSIS — D509 Iron deficiency anemia, unspecified: Secondary | ICD-10-CM

## 2023-10-03 DIAGNOSIS — K838 Other specified diseases of biliary tract: Secondary | ICD-10-CM

## 2023-10-03 DIAGNOSIS — K5732 Diverticulitis of large intestine without perforation or abscess without bleeding: Secondary | ICD-10-CM

## 2023-10-03 DIAGNOSIS — K5720 Diverticulitis of large intestine with perforation and abscess without bleeding: Secondary | ICD-10-CM

## 2023-10-03 LAB — IBC + FERRITIN
Ferritin: 10 ng/mL (ref 10.0–291.0)
Iron: 24 ug/dL — ABNORMAL LOW (ref 42–145)
Saturation Ratios: 5.2 % — ABNORMAL LOW (ref 20.0–50.0)
TIBC: 463.4 ug/dL — ABNORMAL HIGH (ref 250.0–450.0)
Transferrin: 331 mg/dL (ref 212.0–360.0)

## 2023-10-03 LAB — COMPREHENSIVE METABOLIC PANEL WITH GFR
ALT: 13 U/L (ref 0–35)
AST: 15 U/L (ref 0–37)
Albumin: 4 g/dL (ref 3.5–5.2)
Alkaline Phosphatase: 91 U/L (ref 39–117)
BUN: 13 mg/dL (ref 6–23)
CO2: 20 meq/L (ref 19–32)
Calcium: 9.1 mg/dL (ref 8.4–10.5)
Chloride: 105 meq/L (ref 96–112)
Creatinine, Ser: 0.78 mg/dL (ref 0.40–1.20)
GFR: 89.7 mL/min (ref >60.00)
Glucose, Bld: 100 mg/dL — ABNORMAL HIGH (ref 70–99)
Potassium: 4.1 meq/L (ref 3.5–5.1)
Sodium: 138 meq/L (ref 135–145)
Total Bilirubin: 0.2 mg/dL (ref 0.2–1.2)
Total Protein: 8.1 g/dL (ref 6.0–8.3)

## 2023-10-03 LAB — LIPASE: Lipase: 52 U/L (ref 11.0–59.0)

## 2023-10-03 LAB — CBC WITH DIFFERENTIAL/PLATELET
Basophils Absolute: 0 K/uL (ref 0.0–0.1)
Basophils Relative: 0.6 % (ref 0.0–3.0)
Eosinophils Absolute: 0.2 K/uL (ref 0.0–0.7)
Eosinophils Relative: 3.1 % (ref 0.0–5.0)
HCT: 32.4 % — ABNORMAL LOW (ref 36.0–46.0)
Hemoglobin: 10.2 g/dL — ABNORMAL LOW (ref 12.0–15.0)
Lymphocytes Relative: 20.4 % (ref 12.0–46.0)
Lymphs Abs: 1.1 K/uL (ref 0.7–4.0)
MCHC: 31.3 g/dL (ref 30.0–36.0)
MCV: 75.5 fl — ABNORMAL LOW (ref 78.0–100.0)
Monocytes Absolute: 0.7 K/uL (ref 0.1–1.0)
Monocytes Relative: 14 % — ABNORMAL HIGH (ref 3.0–12.0)
Neutro Abs: 3.3 K/uL (ref 1.4–7.7)
Neutrophils Relative %: 61.9 % (ref 43.0–77.0)
Platelets: 416 K/uL — ABNORMAL HIGH (ref 150.0–400.0)
RBC: 4.29 Mil/uL (ref 3.87–5.11)
RDW: 17.6 % — ABNORMAL HIGH (ref 11.5–15.5)
WBC: 5.3 K/uL (ref 4.0–10.5)

## 2023-10-03 MED ORDER — PANTOPRAZOLE SODIUM 40 MG PO TBEC
40.0000 mg | DELAYED_RELEASE_TABLET | Freq: Two times a day (BID) | ORAL | 0 refills | Status: DC
  Filled 2023-10-03: qty 180, 90d supply, fill #0

## 2023-10-03 MED ORDER — NA SULFATE-K SULFATE-MG SULF 17.5-3.13-1.6 GM/177ML PO SOLN
1.0000 | Freq: Once | ORAL | 0 refills | Status: AC
Start: 2023-10-03 — End: 2023-10-04
  Filled 2023-10-03: qty 354, 1d supply, fill #0

## 2023-10-03 NOTE — Patient Instructions (Addendum)
 Your provider has requested that you go to the basement level for lab work before leaving today. Press B on the elevator. The lab is located at the first door on the left as you exit the elevator.   You have been scheduled for an endoscopy and colonoscopy. Please follow the written instructions given to you at your visit today.  If you use inhalers (even only as needed), please bring them with you on the day of your procedure.  DO NOT TAKE 7 DAYS PRIOR TO TEST- Trulicity (dulaglutide) Ozempic , Wegovy  (semaglutide ) Mounjaro (tirzepatide) Bydureon Bcise (exanatide extended release)  DO NOT TAKE 1 DAY PRIOR TO YOUR TEST Rybelsus  (semaglutide ) Adlyxin (lixisenatide) Victoza (liraglutide) Byetta (exanatide) ___________________________________________________________________________   We have sent the following medications to your pharmacy for you to pick up at your convenience: Protonix  40 mg twice a day.   _______________________________________________________  If your blood pressure at your visit was 140/90 or greater, please contact your primary care physician to follow up on this.  _______________________________________________________  If you are age 79 or older, your body mass index should be between 23-30. Your Body mass index is 32.95 kg/m. If this is out of the aforementioned range listed, please consider follow up with your Primary Care Provider.  If you are age 8 or younger, your body mass index should be between 19-25. Your Body mass index is 32.95 kg/m. If this is out of the aformentioned range listed, please consider follow up with your Primary Care Provider.   ________________________________________________________  The  GI providers would like to encourage you to use MYCHART to communicate with providers for non-urgent requests or questions.  Due to long hold times on the telephone, sending your provider a message by [facility] may be a faster and more  efficient way to get a response.  Please allow 48 business hours for a response.  Please remember that this is for non-urgent requests.  _______________________________________________________  [facility] is using a team-based approach to care.  Your team is made up of your doctor and two to three APPS. Our APPS (Nurse Practitioners and Physician Assistants) work with your physician to ensure care continuity for you. They are fully qualified to address your health concerns and develop a treatment plan. They communicate directly with your gastroenterologist to care for you. Seeing the Advanced Practice Practitioners on your physician's team can help you by facilitating care more promptly, often allowing for earlier appointments, access to diagnostic testing, procedures, and other specialty referrals.

## 2023-10-03 NOTE — Telephone Encounter (Signed)
 Copied from CRM [id number]. Topic: General - Other >> Oct 03, 2023  2:43 PM Nathanel BROCKS wrote: Reason for CRM:  Pt is calling about her FMLA paperwork and it needs to be edited and corrected. Pt is requesting a call back at [phone]. Please calll regarding this paperwork as her employer needs it.

## 2023-10-04 ENCOUNTER — Ambulatory Visit: Payer: Self-pay | Admitting: Gastroenterology

## 2023-10-04 LAB — BASIC METABOLIC PANEL WITH GFR
BUN/Creatinine Ratio: 16 (ref 9–23)
BUN: 13 mg/dL (ref 6–24)
CO2: 17 mmol/L — ABNORMAL LOW (ref 20–29)
Calcium: 9.6 mg/dL (ref 8.7–10.2)
Chloride: 106 mmol/L (ref 96–106)
Creatinine, Ser: 0.82 mg/dL (ref 0.57–1.00)
Glucose: 95 mg/dL (ref 70–99)
Potassium: 4.2 mmol/L (ref 3.5–5.2)
Sodium: 138 mmol/L (ref 134–144)
eGFR: 88 mL/min/1.73 (ref >59)

## 2023-10-04 LAB — CBC
Hematocrit: 35.1 % (ref 34.0–46.6)
Hemoglobin: 10.4 g/dL — ABNORMAL LOW (ref 11.1–15.9)
MCH: 23.8 pg — ABNORMAL LOW (ref 26.6–33.0)
MCHC: 29.6 g/dL — ABNORMAL LOW (ref 31.5–35.7)
MCV: 80 fL (ref 79–97)
Platelets: 438 x10E3/uL (ref 150–450)
RBC: 4.37 x10E6/uL (ref 3.77–5.28)
RDW: 16.3 % — ABNORMAL HIGH (ref 11.7–15.4)
WBC: 5.9 x10E3/uL (ref 3.4–10.8)

## 2023-10-05 NOTE — Telephone Encounter (Signed)
 Patient requesting medication for abdominal pain she is persistently having. Please advise.   Thank you

## 2023-10-05 NOTE — Telephone Encounter (Signed)
 Done [facility]

## 2023-10-06 ENCOUNTER — Other Ambulatory Visit (HOSPITAL_COMMUNITY): Payer: Self-pay

## 2023-10-06 ENCOUNTER — Other Ambulatory Visit: Payer: Self-pay

## 2023-10-06 MED ORDER — HYOSCYAMINE SULFATE 0.125 MG SL SUBL
0.1250 mg | SUBLINGUAL_TABLET | Freq: Three times a day (TID) | SUBLINGUAL | 1 refills | Status: DC | PRN
  Filled 2023-10-06: qty 30, 10d supply, fill #0

## 2023-10-06 NOTE — Progress Notes (Signed)
 Prescription as been sent to the pharmacy  Place call to the pt no answer and no voice mail

## 2023-10-06 NOTE — Progress Notes (Signed)
 Left message on machine to call back

## 2023-10-10 NOTE — Progress Notes (Signed)
 Attending Physician's Attestation   I have reviewed the chart.   I agree with the Advanced Practitioner's note, impression, and recommendations with any updates as below. Repeat MRI/MRCP, could be considered sooner than 1 year mark, but unless significant progressive changes have occurred, would not plan repeat upper endoscopic ultrasound.  Functional abdominal pain needs to be considered.  Iron deficiency anemia evaluation reasonable with upper or lower endoscopy especially after recent complicated diverticulitis.  She may benefit from surgical evaluation in future, with this 1 episode of complicated diverticulitis.  I am happy to be available for these procedures, in absence of Dr. Albertus having earlier availability.   Aloha Finner, MD [facility] Advanced Endoscopy Office # [phone]

## 2023-10-13 ENCOUNTER — Telehealth: Payer: Self-pay | Admitting: Gastroenterology

## 2023-10-13 DIAGNOSIS — K838 Other specified diseases of biliary tract: Secondary | ICD-10-CM

## 2023-10-13 DIAGNOSIS — R1011 Right upper quadrant pain: Secondary | ICD-10-CM

## 2023-10-13 DIAGNOSIS — K862 Cyst of pancreas: Secondary | ICD-10-CM

## 2023-10-13 NOTE — Telephone Encounter (Signed)
 Please let patient know that I have discussed with Dr. Wilhelmenia regarding repeat MRI/MRCP, and he is okay with repeating this before the 1 year mark which would be 12/05/23.  If patient is agreeable, can go ahead and order MRCP for the following diagnoses: RUQ abdominal pain, pancreatic cyst, dilated CBD.

## 2023-10-16 NOTE — Telephone Encounter (Signed)
 The pt agrees to the MRI MRCP - order placed and sent to the schedulers

## 2023-10-18 ENCOUNTER — Other Ambulatory Visit (HOSPITAL_COMMUNITY): Payer: Self-pay

## 2023-10-19 ENCOUNTER — Telehealth: Payer: Self-pay | Admitting: Gastroenterology

## 2023-10-19 ENCOUNTER — Ambulatory Visit: Payer: Self-pay

## 2023-10-19 NOTE — Telephone Encounter (Signed)
 Inbound call from patient stating she would like to speak to nurse in regards to upcoming MRI appointment on 9/19. Patient states she's claustrophobic. Requesting a call back  Please advise  Thank you

## 2023-10-19 NOTE — Telephone Encounter (Signed)
 FYI Only or Action Required?: Action required by provider: clinical question for provider.  Patient was last seen in primary care on 09/08/2023 by Paseda, [name], FNP.  Called Nurse Triage reporting Medication Question.  Symptoms began Last MRI was 11/2022 in the ED, Pt was given 1mg  Ativan  IVP.  Interventions attempted: Nothing.  Symptoms are: stable.  Triage Disposition: Information or Advice Only Call  Patient/caregiver understands and will follow disposition?: Yes  Copied from CRM #[id number]. Topic: Clinical - Red Word Triage >> Oct 19, 2023  1:25 PM Tiffini S wrote: [facility] that prompted transfer to Nurse Triage: Patient called about a MRI appointment scheduled- that she is claustrophobic and needs a medication to help get her minds off of it- transferred call to triage nurse. Reason for Disposition  Question about upcoming scheduled surgery, procedure or test, no triage required, and triager able to answer question  Answer Assessment - Initial Assessment Questions 1. REASON FOR CALL: What is the main reason for your call? or How can I best help you?     Patient requesting anxiety medication for upcoming MRI on 09/19  2. SYMPTOMS : Do you have any symptoms?      Claustrophobia  3. OTHER QUESTIONS: Do you have any other questions?     none  Protocols used: Information Only Call - No Triage-A-AH

## 2023-10-19 NOTE — Telephone Encounter (Signed)
 Kaylee Maxwell the pt states that she is claustrophobic and needs anxiety meds called in to proceed with MRI- please advise

## 2023-10-19 NOTE — Telephone Encounter (Signed)
 I have spoken to the pt and she will call PCP and see if they will take care of the anxiety medication.  She will call back if there is an issue

## 2023-10-24 ENCOUNTER — Other Ambulatory Visit: Payer: Self-pay | Admitting: Urology

## 2023-10-24 DIAGNOSIS — C642 Malignant neoplasm of left kidney, except renal pelvis: Secondary | ICD-10-CM

## 2023-10-26 ENCOUNTER — Telehealth: Payer: Self-pay

## 2023-10-26 ENCOUNTER — Ambulatory Visit: Payer: Self-pay

## 2023-10-26 ENCOUNTER — Encounter: Payer: Self-pay | Admitting: Gastroenterology

## 2023-10-26 ENCOUNTER — Other Ambulatory Visit: Payer: Self-pay

## 2023-10-26 ENCOUNTER — Other Ambulatory Visit (HOSPITAL_COMMUNITY): Payer: Self-pay

## 2023-10-26 MED ORDER — ALPRAZOLAM 0.5 MG PO TABS: 0.5000 mg | ORAL_TABLET | ORAL | 0 refills | Status: DC

## 2023-10-26 MED ORDER — HYOSCYAMINE SULFATE 0.125 MG SL SUBL
0.1250 mg | SUBLINGUAL_TABLET | Freq: Four times a day (QID) | SUBLINGUAL | 1 refills | Status: AC | PRN
  Filled 2023-10-26: qty 30, 8d supply, fill #0

## 2023-10-26 MED ORDER — ALPRAZOLAM 0.5 MG PO TABS
0.5000 mg | ORAL_TABLET | ORAL | 0 refills | Status: DC
Start: 2023-10-26 — End: 2023-10-26

## 2023-10-26 MED ORDER — ALPRAZOLAM 0.5 MG PO TABS
0.5000 mg | ORAL_TABLET | ORAL | 0 refills | Status: DC
  Filled 2023-10-26 – 2023-10-27 (×2): qty 2, 1d supply, fill #0

## 2023-10-26 NOTE — Addendum Note (Signed)
 Addended by: ANITRA MILL L on: 10/26/2023 04:00 PM   Modules accepted: Orders

## 2023-10-26 NOTE — Telephone Encounter (Signed)
 Copied from CRM #[id number]. Topic: Clinical - Red Word Triage >> Oct 19, 2023  1:25 PM Tiffini S wrote: [facility] that prompted transfer to Nurse Triage: Patient called about a MRI appointment scheduled- that she is claustrophobic and needs a medication to help get her minds off of it- transferred call to triage nurse. >> Oct 26, 2023  8:17 AM Thersia C wrote: Patient called in wanted to follow up regarding the medication for her MRI, patient stated she will not do MRI if she doesn't have the medication and it is scheduled for tomorrow , would like a callback regarding this today

## 2023-10-26 NOTE — Telephone Encounter (Signed)
 Hyoscyamine  has been sent to the pharmacy [facility] referral has been made with records faxed  The pt will reschedule the MRI and try the alprazolam 

## 2023-10-26 NOTE — Telephone Encounter (Signed)
 FYI Only or Action Required?: FYI only for provider.  Patient was last seen in primary care on 09/08/2023 by Paseda, [name], FNP.  Called Nurse Triage reporting Abdominal Pain.  Symptoms began several months ago.  Interventions attempted: Nothing.  Symptoms are: gradually worsening.  Triage Disposition: See PCP Within 2 Weeks  Patient/caregiver understands and will follow disposition?: Yes    Copied from CRM [id number]. Topic: Clinical - Red Word Triage >> Oct 26, 2023 11:24 AM Kaylee Maxwell wrote: Red Word that prompted transfer to Nurse Triage: pt has direticulitis with extreme abdomen pain and wants to be a new patient at primary care at [facility] Reason for Disposition  Abdominal pain is a chronic symptom (recurrent or ongoing AND present > 4 weeks)  Answer Assessment - Initial Assessment Questions Patient states symptoms are worsening and have been ongoing. Pt denies chest pain, shortness of breath, or dizziness.   1. LOCATION: Where does it hurt?      Upper R side of abdomen  2. RADIATION: Does the pain shoot anywhere else? (e.g., chest, back)     [place]  3. ONSET: When did the pain begin? (e.g., minutes, hours or days ago)      May  4. SUDDEN: Gradual or sudden onset?     Gradual  5. PATTERN Does the pain come and go, or is it constant?     Constant but changes in severity  6. SEVERITY: How bad is the pain?  (e.g., Scale 1-10; mild, moderate, or severe)     8/10  7. OTHER SYMPTOMS: Do you have any other symptoms? (e.g., back pain, diarrhea, fever, urination pain, vomiting)       Fever, nausea, vomiting back in July now resolved  Protocols used: Abdominal Pain - [facility]

## 2023-10-27 ENCOUNTER — Other Ambulatory Visit (HOSPITAL_COMMUNITY): Payer: Self-pay

## 2023-10-27 ENCOUNTER — Ambulatory Visit (HOSPITAL_COMMUNITY)
Admission: RE | Admit: 2023-10-27 | Discharge: 2023-10-27 | Disposition: A | Discharge status: Home / Self Care | Source: Ambulatory Visit | Attending: Gastroenterology | Admitting: Gastroenterology

## 2023-10-27 ENCOUNTER — Other Ambulatory Visit: Payer: Self-pay | Admitting: Gastroenterology

## 2023-10-27 ENCOUNTER — Ambulatory Visit (HOSPITAL_COMMUNITY): Admission: RE | Admit: 2023-10-27 | Source: Ambulatory Visit

## 2023-10-27 ENCOUNTER — Other Ambulatory Visit: Payer: Self-pay

## 2023-10-27 DIAGNOSIS — R1011 Right upper quadrant pain: Secondary | ICD-10-CM | POA: Insufficient documentation

## 2023-10-27 DIAGNOSIS — K862 Cyst of pancreas: Secondary | ICD-10-CM | POA: Insufficient documentation

## 2023-10-27 DIAGNOSIS — R160 Hepatomegaly, not elsewhere classified: Secondary | ICD-10-CM | POA: Diagnosis not present

## 2023-10-27 DIAGNOSIS — K838 Other specified diseases of biliary tract: Secondary | ICD-10-CM

## 2023-10-27 DIAGNOSIS — N289 Disorder of kidney and ureter, unspecified: Secondary | ICD-10-CM | POA: Diagnosis not present

## 2023-10-27 DIAGNOSIS — N281 Cyst of kidney, acquired: Secondary | ICD-10-CM | POA: Diagnosis not present

## 2023-10-27 MED ORDER — GADOBUTROL 1 MMOL/ML IV SOLN
9.0000 mL | Freq: Once | INTRAVENOUS | Status: AC | PRN
Start: 2023-10-27 — End: 2023-10-27
  Administered 2023-10-27: 9 mL via INTRAVENOUS

## 2023-10-30 ENCOUNTER — Ambulatory Visit: Admitting: Family Medicine

## 2023-10-30 ENCOUNTER — Ambulatory Visit: Payer: Self-pay | Admitting: Gastroenterology

## 2023-10-30 ENCOUNTER — Other Ambulatory Visit (HOSPITAL_COMMUNITY): Payer: Self-pay

## 2023-10-30 VITALS — BP 157/91 | HR 90 | Ht 65.0 in | Wt 199.0 lb

## 2023-10-30 DIAGNOSIS — R1010 Upper abdominal pain, unspecified: Secondary | ICD-10-CM | POA: Diagnosis not present

## 2023-10-30 DIAGNOSIS — R030 Elevated blood-pressure reading, without diagnosis of hypertension: Secondary | ICD-10-CM | POA: Diagnosis not present

## 2023-10-30 MED ORDER — HYDROCODONE-ACETAMINOPHEN 5-325 MG PO TABS
1.0000 | ORAL_TABLET | Freq: Four times a day (QID) | ORAL | 0 refills | Status: DC | PRN
  Filled 2023-10-30: qty 20, 5d supply, fill #0

## 2023-10-30 NOTE — Progress Notes (Unsigned)
 Established Patient Office Visit  Subjective    Patient ID: Kaylee Maxwell, female    DOB: 02/09/1975  Age: 48 y.o. MRN: [id number]  CC:  Chief Complaint  Patient presents with   Abdominal Pain    Pt is having some abdominal pain that has been going on for about 3 months     HPI [name] presents for persistent abdominal pain. Patient has been seen by consultants for symptoms and has had procedures and has scheduled appts upcoming. She reports that she is unable to function on a daily basis 2/2 sx and she is frustrated.   Outpatient Encounter Medications as of 10/30/2023  Medication Sig   acetaminophen  (TYLENOL ) 325 MG tablet Take 2 tablets (650 mg total) by mouth every 6 (six) hours as needed for mild pain (pain score 1-3), fever or moderate pain (pain score 4-6) (or Fever >/= 101).   aspirin  EC 81 MG tablet Take 81 mg by mouth in the morning. Swallow whole.   atorvastatin  (LIPITOR) 10 MG tablet Take 1 tablet (10 mg total) by mouth daily.   ferrous sulfate  325 (65 FE) MG EC tablet Take 1 tablet (325 mg total) by mouth daily.   glucose blood (GNP TRUE METRIX GLUCOSE STRIPS) test strip Use as instructed   HYDROcodone -acetaminophen  (NORCO/VICODIN) 5-325 MG tablet Take 1 tablet by mouth every 6 (six) hours as needed for severe pain (pain score 7-10).   Lancets (FREESTYLE) lancets Use as directed   metFORMIN  (GLUCOPHAGE -XR) 500 MG 24 hr tablet Take 500 mg with breakfast daily by mouth  for one week , then increase to 500 mg by mouth twice daily with meal   metoprolol  succinate (TOPROL -XL) 50 MG 24 hr tablet Take 1 tablet (50 mg total) by mouth daily. Take with or immediately following a meal. Please schedule follow up appt for more refills   pantoprazole  (PROTONIX ) 40 MG tablet Take 1 tablet (40 mg total) by mouth 2 (two) times daily.   traZODone  (DESYREL ) 50 MG tablet Take 1 tablet (50 mg total) by mouth at bedtime as needed for sleep   ALPRAZolam  (XANAX ) 0.5 MG tablet Take 1  tablet (0.5 mg total) by mouth as directed. 1 hour prior to the MRI (Patient not taking: Reported on 10/30/2023)   ALPRAZolam  (XANAX ) 0.5 MG tablet Take 1 tablet (0.5 mg total) by mouth 1 hour prior to MRI-- may repeat if needed (Patient not taking: Reported on 10/30/2023)   hyoscyamine  (LEVSIN  SL) 0.125 MG SL tablet Place 1 tablet (0.125 mg total) under the tongue every 6 (six) hours as needed. (Patient not taking: Reported on 10/30/2023)   No facility-administered encounter medications on file as of 10/30/2023.    Past Medical History:  Diagnosis Date   Anxiety 03/04/2018   Breast cancer screening 03/04/2018   Heart palpitations 03/04/2018   Monitor 9/21: normal sinus rhythm, sinus tachycardia, Avg HR 110; occ PVCs   Hypertension    Iron deficiency    Irregular heartbeat 03/04/2018   Mild mitral regurgitation    Obesity    Osteochondroma    PVC's (premature ventricular contractions)    Scalp mass 10/09/2012   Sinus tachycardia    Type 2 diabetes mellitus [facility])     Past Surgical History:  Procedure Laterality Date   ANKLE FRACTURE SURGERY Right    BIOPSY  02/02/2023   Procedure: BIOPSY;  Surgeon: Wilhelmenia Aloha Raddle., MD;  Location: [facility];  Service: Gastroenterology;;   BUNIONECTOMY Right    cyst removal  from scalp     ESOPHAGOGASTRODUODENOSCOPY N/A 02/02/2023   Procedure: ESOPHAGOGASTRODUODENOSCOPY (EGD);  Surgeon: Wilhelmenia Aloha Raddle., MD;  Location: [facility];  Service: Gastroenterology;  Laterality: N/A;   EUS N/A 02/02/2023   Procedure: UPPER ENDOSCOPIC ULTRASOUND (EUS) RADIAL;  Surgeon: Wilhelmenia Aloha Raddle., MD;  Location: [facility];  Service: Gastroenterology;  Laterality: N/A;   left leg bone removal     ROBOTIC ASSITED PARTIAL NEPHRECTOMY Left 07/04/2023   Procedure: XI ROBOTIC ASSITED LEFT  PARTIAL NEPHRECTOMY;  Surgeon: Devere Lonni Righter, MD;  Location: [facility];  Service: Urology;  Laterality: Left;  180 MINUTES    Family History  Problem  Relation Age of Onset   Diabetes Mother    Hypertension Mother    Obesity Mother    Hypertension Father    Cancer Paternal Aunt        pancreatic   Cancer Maternal Grandmother        lung   Autism spectrum disorder Sister     Social History   Socioeconomic History   Marital status: Single    Spouse name: Not on file   Number of children: 2   Years of education: Not on file   Highest education level: 12th grade  Occupational History   Not on file  Tobacco Use   Smoking status: Former    Current packs/day: 0.50    Average packs/day: 0.5 packs/day for 18.0 years (9.0 ttl pk-yrs)    Types: Cigarettes    Passive exposure: Never   Smokeless tobacco: Never  Vaping Use   Vaping status: Former   Substances: Nicotine , Flavoring  Substance and Sexual Activity   Alcohol use: Yes    Comment: occasional   Drug use: No   Sexual activity: Yes    Birth control/protection: None  Other Topics Concern   Not on file  Social History Narrative   Lives with her sons.    Social Drivers of Health   Financial Resource Strain: Medium Risk (09/08/2023)   Overall Financial Resource Strain (CARDIA)    Difficulty of Paying Living Expenses: Somewhat hard  Food Insecurity: No Food Insecurity (09/08/2023)   Hunger Vital Sign    Worried About Running Out of Food in the Last Year: Never true    Ran Out of Food in the Last Year: Never true  Transportation Needs: No Transportation Needs (09/08/2023)   PRAPARE - Administrator, Civil Service (Medical): No    Lack of Transportation (Non-Medical): No  Physical Activity: Unknown (09/08/2023)   Exercise Vital Sign    Days of Exercise per Week: Patient declined    Minutes of Exercise per Session: Not on file  Stress: No Stress Concern Present (09/08/2023)   [employer] of Occupational Health - Occupational Stress Questionnaire    Feeling of Stress: Not at all  Social Connections: Moderately Isolated (09/08/2023)   Social Connection and  Isolation Panel    Frequency of Communication with Friends and Family: Three times a week    Frequency of Social Gatherings with Friends and Family: Once a week    Attends Religious Services: More than 4 times per year    Active Member of [employer] or Organizations: No    Attends Banker Meetings: Not on file    Marital Status: Never married  Intimate Partner Violence: Not At Risk (09/01/2023)   Humiliation, Afraid, Rape, and Kick questionnaire    Fear of Current or Ex-Partner: No    Emotionally Abused: No  Physically Abused: No    Sexually Abused: No    Review of Systems  Gastrointestinal:  Positive for abdominal pain. Negative for constipation, diarrhea and vomiting.  All other systems reviewed and are negative.       Objective    BP (!) 157/91   Pulse 90   Ht 5' 5 (1.651 m)   Wt 199 lb (90.3 kg)   SpO2 98%   BMI 33.12 kg/m   Physical Exam Vitals and nursing note reviewed.  Constitutional:      General: She is not in acute distress (appears to be in mod-severe pain). Cardiovascular:     Rate and Rhythm: Normal rate and regular rhythm.  Pulmonary:     Effort: Pulmonary effort is normal.     Breath sounds: Normal breath sounds.  Abdominal:     Palpations: Abdomen is soft.     Tenderness: There is abdominal tenderness in the right upper quadrant and left upper quadrant.  Neurological:     General: No focal deficit present.     Mental Status: She is alert and oriented to person, place, and time.         Assessment & Plan:   Pain of upper abdomen  Elevated blood pressure reading in office without diagnosis of hypertension  Other orders -     HYDROcodone -Acetaminophen ; Take 1 tablet by mouth every 6 (six) hours as needed for severe pain (pain score 7-10).  Dispense: 20 tablet; Refill: 0   Patient recommended to follow up with consultant and see if a sooner follow up is available. If sx persist or worsen ED. Patient v.u.  Return if symptoms  worsen or fail to improve.   Tanda Raguel SQUIBB, MD

## 2023-10-31 ENCOUNTER — Inpatient Hospital Stay: Admission: RE | Admit: 2023-10-31 | Source: Ambulatory Visit

## 2023-10-31 ENCOUNTER — Encounter: Payer: Self-pay | Admitting: Family Medicine

## 2023-10-31 NOTE — Telephone Encounter (Signed)
 It appears pt has had a MRI done. KH

## 2023-11-08 ENCOUNTER — Emergency Department (HOSPITAL_COMMUNITY)

## 2023-11-08 ENCOUNTER — Observation Stay (HOSPITAL_COMMUNITY)
Admission: EM | Admit: 2023-11-08 | Discharge: 2023-11-09 | Disposition: A | Discharge status: Home / Self Care | Source: Ambulatory Visit | Attending: Internal Medicine | Admitting: Internal Medicine

## 2023-11-08 ENCOUNTER — Encounter (HOSPITAL_COMMUNITY): Payer: Self-pay | Admitting: *Deleted

## 2023-11-08 ENCOUNTER — Other Ambulatory Visit: Payer: Self-pay

## 2023-11-08 DIAGNOSIS — F10920 Alcohol use, unspecified with intoxication, uncomplicated: Secondary | ICD-10-CM | POA: Insufficient documentation

## 2023-11-08 DIAGNOSIS — K760 Fatty (change of) liver, not elsewhere classified: Secondary | ICD-10-CM | POA: Diagnosis not present

## 2023-11-08 DIAGNOSIS — K838 Other specified diseases of biliary tract: Secondary | ICD-10-CM | POA: Insufficient documentation

## 2023-11-08 DIAGNOSIS — Z6833 Body mass index (BMI) 33.0-33.9, adult: Secondary | ICD-10-CM | POA: Insufficient documentation

## 2023-11-08 DIAGNOSIS — K7689 Other specified diseases of liver: Secondary | ICD-10-CM | POA: Diagnosis not present

## 2023-11-08 DIAGNOSIS — E66811 Obesity, class 1: Secondary | ICD-10-CM | POA: Insufficient documentation

## 2023-11-08 DIAGNOSIS — E119 Type 2 diabetes mellitus without complications: Secondary | ICD-10-CM | POA: Insufficient documentation

## 2023-11-08 DIAGNOSIS — K5750 Diverticulosis of both small and large intestine without perforation or abscess without bleeding: Secondary | ICD-10-CM | POA: Diagnosis not present

## 2023-11-08 DIAGNOSIS — K5792 Diverticulitis of intestine, part unspecified, without perforation or abscess without bleeding: Secondary | ICD-10-CM | POA: Diagnosis not present

## 2023-11-08 DIAGNOSIS — K5732 Diverticulitis of large intestine without perforation or abscess without bleeding: Secondary | ICD-10-CM | POA: Insufficient documentation

## 2023-11-08 DIAGNOSIS — Z79899 Other long term (current) drug therapy: Secondary | ICD-10-CM | POA: Insufficient documentation

## 2023-11-08 DIAGNOSIS — D509 Iron deficiency anemia, unspecified: Secondary | ICD-10-CM | POA: Diagnosis not present

## 2023-11-08 DIAGNOSIS — Z905 Acquired absence of kidney: Secondary | ICD-10-CM | POA: Diagnosis not present

## 2023-11-08 DIAGNOSIS — R1011 Right upper quadrant pain: Secondary | ICD-10-CM | POA: Diagnosis not present

## 2023-11-08 DIAGNOSIS — Z7982 Long term (current) use of aspirin: Secondary | ICD-10-CM | POA: Diagnosis not present

## 2023-11-08 DIAGNOSIS — R1010 Upper abdominal pain, unspecified: Secondary | ICD-10-CM | POA: Diagnosis not present

## 2023-11-08 DIAGNOSIS — I10 Essential (primary) hypertension: Secondary | ICD-10-CM | POA: Diagnosis present

## 2023-11-08 LAB — COMPREHENSIVE METABOLIC PANEL WITH GFR
ALT: 22 U/L (ref 0–44)
AST: 28 U/L (ref 15–41)
Albumin: 4.1 g/dL (ref 3.5–5.0)
Alkaline Phosphatase: 102 U/L (ref 38–126)
Anion gap: 14 (ref 5–15)
BUN: 9 mg/dL (ref 6–20)
CO2: 19 mmol/L — ABNORMAL LOW (ref 22–32)
Calcium: 9.3 mg/dL (ref 8.9–10.3)
Chloride: 104 mmol/L (ref 98–111)
Creatinine, Ser: 0.87 mg/dL (ref 0.44–1.00)
GFR, Estimated: >60 mL/min (ref >60)
Glucose, Bld: 109 mg/dL — ABNORMAL HIGH (ref 70–99)
Potassium: 3.6 mmol/L (ref 3.5–5.1)
Sodium: 138 mmol/L (ref 135–145)
Total Bilirubin: 0.4 mg/dL (ref 0.0–1.2)
Total Protein: 7.7 g/dL (ref 6.5–8.1)

## 2023-11-08 LAB — CBC WITH DIFFERENTIAL/PLATELET
Abs Immature Granulocytes: 0.01 K/uL (ref 0.00–0.07)
Basophils Absolute: 0 K/uL (ref 0.0–0.1)
Basophils Relative: 0 %
Eosinophils Absolute: 0.1 K/uL (ref 0.0–0.5)
Eosinophils Relative: 3 %
HCT: 35.6 % — ABNORMAL LOW (ref 36.0–46.0)
Hemoglobin: 10.5 g/dL — ABNORMAL LOW (ref 12.0–15.0)
Immature Granulocytes: 0 %
Lymphocytes Relative: 26 %
Lymphs Abs: 1 K/uL (ref 0.7–4.0)
MCH: 23 pg — ABNORMAL LOW (ref 26.0–34.0)
MCHC: 29.5 g/dL — ABNORMAL LOW (ref 30.0–36.0)
MCV: 78.1 fL — ABNORMAL LOW (ref 80.0–100.0)
Monocytes Absolute: 0.5 K/uL (ref 0.1–1.0)
Monocytes Relative: 13 %
Neutro Abs: 2.3 K/uL (ref 1.7–7.7)
Neutrophils Relative %: 58 %
Platelets: 420 K/uL — ABNORMAL HIGH (ref 150–400)
RBC: 4.56 MIL/uL (ref 3.87–5.11)
RDW: 15.7 % — ABNORMAL HIGH (ref 11.5–15.5)
WBC: 4 K/uL (ref 4.0–10.5)
nRBC: 0 % (ref 0.0–0.2)

## 2023-11-08 LAB — URINALYSIS, W/ REFLEX TO CULTURE (INFECTION SUSPECTED)
Bilirubin Urine: NEGATIVE
Glucose, UA: NEGATIVE mg/dL
Hgb urine dipstick: NEGATIVE
Ketones, ur: NEGATIVE mg/dL
Leukocytes,Ua: NEGATIVE
Nitrite: NEGATIVE
Protein, ur: 100 mg/dL — AB
Specific Gravity, Urine: 1.015 (ref 1.005–1.030)
pH: 5 (ref 5.0–8.0)

## 2023-11-08 LAB — HCG, SERUM, QUALITATIVE: Preg, Serum: NEGATIVE

## 2023-11-08 LAB — LIPASE, BLOOD: Lipase: 56 U/L — ABNORMAL HIGH (ref 11–51)

## 2023-11-08 LAB — I-STAT CG4 LACTIC ACID, ED
Lactic Acid, Venous: 2.2 mmol/L (ref 0.5–1.9)
Lactic Acid, Venous: 1.5 mmol/L (ref 0.5–1.9)

## 2023-11-08 LAB — SEDIMENTATION RATE: Sed Rate: 17 mm/h (ref 0–22)

## 2023-11-08 LAB — C-REACTIVE PROTEIN: CRP: 1.1 mg/dL — ABNORMAL HIGH (ref <1.0)

## 2023-11-08 MED ORDER — PANTOPRAZOLE SODIUM 40 MG PO TBEC
40.0000 mg | DELAYED_RELEASE_TABLET | Freq: Two times a day (BID) | ORAL | Status: DC
  Administered 2023-11-08 – 2023-11-09 (×2): 40 mg via ORAL
  Filled 2023-11-08 (×2): qty 1

## 2023-11-08 MED ORDER — ONDANSETRON HCL 4 MG/2ML IJ SOLN
4.0000 mg | Freq: Once | INTRAMUSCULAR | Status: AC
Start: 2023-11-08 — End: 2023-11-08
  Administered 2023-11-08: 4 mg via INTRAVENOUS
  Filled 2023-11-08: qty 2

## 2023-11-08 MED ORDER — DICYCLOMINE HCL 10 MG PO CAPS
10.0000 mg | ORAL_CAPSULE | Freq: Three times a day (TID) | ORAL | Status: DC
  Administered 2023-11-08 – 2023-11-09 (×4): 10 mg via ORAL
  Filled 2023-11-08 (×4): qty 1

## 2023-11-08 MED ORDER — LACTATED RINGERS IV SOLN: INTRAVENOUS | Status: AC

## 2023-11-08 MED ORDER — ACETAMINOPHEN 650 MG RE SUPP: 650.0000 mg | Freq: Four times a day (QID) | RECTAL | Status: DC | PRN

## 2023-11-08 MED ORDER — ONDANSETRON HCL 4 MG/2ML IJ SOLN: 4.0000 mg | Freq: Four times a day (QID) | INTRAMUSCULAR | Status: DC | PRN

## 2023-11-08 MED ORDER — ACETAMINOPHEN 325 MG PO TABS
650.0000 mg | ORAL_TABLET | Freq: Four times a day (QID) | ORAL | Status: DC | PRN
  Administered 2023-11-08 – 2023-11-09 (×2): 650 mg via ORAL
  Filled 2023-11-08 (×2): qty 2

## 2023-11-08 MED ORDER — HYDROMORPHONE HCL 1 MG/ML IJ SOLN
1.0000 mg | Freq: Once | INTRAMUSCULAR | Status: AC
  Administered 2023-11-08: 1 mg via INTRAVENOUS
  Filled 2023-11-08: qty 1

## 2023-11-08 MED ORDER — ONDANSETRON HCL 4 MG PO TABS: 4.0000 mg | ORAL_TABLET | Freq: Four times a day (QID) | ORAL | Status: DC | PRN

## 2023-11-08 MED ORDER — KETOROLAC TROMETHAMINE 30 MG/ML IJ SOLN
30.0000 mg | Freq: Four times a day (QID) | INTRAMUSCULAR | Status: DC
Start: 2023-11-08 — End: 2023-11-13
  Administered 2023-11-08 – 2023-11-09 (×4): 30 mg via INTRAVENOUS
  Filled 2023-11-08 (×4): qty 1

## 2023-11-08 MED ORDER — IOHEXOL 300 MG/ML  SOLN
100.0000 mL | Freq: Once | INTRAMUSCULAR | Status: AC | PRN
  Administered 2023-11-08: 100 mL via INTRAVENOUS

## 2023-11-08 MED ORDER — METOPROLOL SUCCINATE ER 50 MG PO TB24
50.0000 mg | ORAL_TABLET | Freq: Every day | ORAL | Status: DC
  Administered 2023-11-09: 50 mg via ORAL
  Filled 2023-11-08: qty 1

## 2023-11-08 MED ORDER — LACTATED RINGERS IV BOLUS
1000.0000 mL | Freq: Once | INTRAVENOUS | Status: AC
  Administered 2023-11-08: 1000 mL via INTRAVENOUS

## 2023-11-08 MED ORDER — PIPERACILLIN-TAZOBACTAM 3.375 G IVPB 30 MIN
3.3750 g | Freq: Once | INTRAVENOUS | Status: AC
  Administered 2023-11-08: 3.375 g via INTRAVENOUS
  Filled 2023-11-08: qty 50

## 2023-11-08 NOTE — Progress Notes (Signed)
 Pharmacy Note   A consult was received from an ED physician for Zosyn  per pharmacy dosing.    The patient's profile has been reviewed for ht/wt/allergies/indication/available labs.    A one time order has been placed for Zosyn  3.375 gr IV x1 .    Further antibiotics/pharmacy consults should be ordered by admitting physician if indicated.                       Thank you,  Lynnita Somma, PharmD, BCPS 11/08/2023 3:42 PM

## 2023-11-08 NOTE — Assessment & Plan Note (Signed)
 11/08/23 check A1c.  She is can be n.p.o. after midnight.  No need for sliding scale insulin  at this time.

## 2023-11-08 NOTE — H&P (Signed)
 History and Physical    Kaylee Maxwell FMW:[id number] DOB: 07-25-1975 DOA: 11/08/2023  DOS: the patient was seen and examined on 11/08/2023  PCP: Paseda, [name], FNP   Patient coming from: general surgery office  I have personally briefly reviewed patient's old medical records in [facility]  CC: RUQ pain HPI: 48 year old African-American female history of obesity, type 2 diabetes, hypertension, iron deficiency anemia, chronically dilated common bile duct, history of sigmoid diverticulitis with abscess, chronic right upper quadrant pain who presents to the ER from the general surgery office due to right upper quadrant pain.  She was seen by general surgery today for a follow-up appointment and sent to the ER due to concern about her gallbladder.  Patient's had a thorough GI workup including upper endoscopy by [facility].  This was during her December 2024 admission for diverticulitis.  She even had a endoscopic ultrasound which was negative for source of her pain.  She has basely had pain for nearly a year now in the right upper quadrant.  Does not change with appetite or food.  She does state that it does tend to hurt more when she has bowel movements and can feel it especially when she gets constipated.  She was most recently seen by [facility] in August 2025 for similar complaints of right upper quadrant pain.  GI did not recommend a repeat upper endoscopy and thought that she only needed yearly MRCP's.  On arrival to the ER temp 90.2 heart rate 78 blood pressure 157/82 satting 100% on room air  Lactic acid was initially elevated 2.2  White count 4.0, hemoglobin 10.5, platelets of 420, MCV of 78  Lipase of 56  Sodium 138, Tessman 3.6, chloride 104, bicarb 19, BUN of 9, creatinine 0.87, glucose of 109  Total protein 7.7, albumin 4.1, AST 28, ALT 22, alk phos 102, total is 3.5  UA  shows.  Specific gravity 1.015, protein 100, negative nitrite, negative leukocyte esterase,  0-5 WBCs, rare bacteria.  Patient had a right upper quadrant ultrasound performed today which showed stable common bile duct measuring at 10.8 mm with negative evidence of ductal stone or mass.  There is no evidence of wall thickening in the gallbladder.  Her CT abdomen pelvis showed a right hepatic cyst.  His CT reports active diverticulitis in the sigmoid colon with some stranding in the mesentery of the sigmoid area but no abscesses or extraluminal gas.  I have reviewed all of her CTs starting from December 05, 2022, August 29, 2023, September 14, 2023 and today's CT.  They all show the same area of inflammation and stranding near the sigmoid colon.  I highly doubt that she has had persistent acute diverticulitis for nearly 12 months now.  ESR and CRP are pending.  General surgery has been consulted.  HIDA scan has been ordered to rule out gallbladder pathology.  She is n.p.o. after midnight.  No opiates should be ordered.  [facility] consulted for admission.  Significant Events: Admitted 11/08/2023 for RUQ pain   Admission Labs: Lactic acid was initially elevated 2.2 White count 4.0, hemoglobin 10.5, platelets of 420, MCV of 78 Lipase of 56 Sodium 138, Tessman 3.6, chloride 104, bicarb 19, BUN of 9, creatinine 0.87, glucose of 109 Total protein 7.7, albumin 4.1, AST 28, ALT 22, alk phos 102, total is 3.5 UA  shows.  Specific gravity 1.015, protein 100, negative nitrite, negative leukocyte esterase, 0-5 WBCs, rare bacteria.  Admission Imaging Studies: RUQ U/S  No acute findings. 2. Stable dilatation of the common bile duct measuring 10.8 mm with no evidence of ductal stone or mass on recent MRI. 3. Mild hepatic steatosis.  2 cm liver cyst CT abd/pelvis .Mild-to-moderate acute sigmoid diverticulitis without abscess or extraluminal gas, similar in location to 09/14/2023 . If this continues to fail to resolve despite appropriate therapy, further workup to exclude unusual predisposing  factors such as an underlying tumor may be indicated. 2. Stable extrahepatic biliary dilatation with 1.0 cm common bile duct and distal tapering near the ampulla. 3. Right hepatic lobe cyst with faint septations; cystadenoma not fully excluded. 4. Postsurgical changes from left lower pole partial nephrectomy, unchanged. 5. Colonic diverticulosis, greatest in the sigmoid colon. 6. 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 7. Mild uterine lobularity suggesting fibroids.  Significant Labs:   Significant Imaging Studies:   Antibiotic Therapy: Anti-infectives (From admission, onward)    Start     Dose/Rate Route Frequency Ordered Stop   11/08/23 1545  piperacillin -tazobactam (ZOSYN ) IVPB 3.375 g        3.375 g 100 mL/hr over 30 Minutes Intravenous  Once 11/08/23 1540         Procedures:   Consultants:    ED Course: CT abd, general surgery consult.  Review of Systems:  Review of Systems  Constitutional: Negative.   HENT: Negative.    Eyes: Negative.   Respiratory:  Negative for cough.   Cardiovascular:  Negative for chest pain.  Gastrointestinal:        Persistent RUQ pain. Mostly with movement. Worse with constipation or having BM. No dyspepsia. RUQ pain not related to food consumption.  Genitourinary: Negative.   Musculoskeletal: Negative.   Skin: Negative.   Neurological: Negative.   Endo/Heme/Allergies: Negative.   Psychiatric/Behavioral: Negative.    All other systems reviewed and are negative.   Past Medical History:  Diagnosis Date   Abscess of sigmoid colon due to diverticulitis 08/08/2023   Anxiety 03/04/2018   Breast cancer screening 03/04/2018   Heart palpitations 03/04/2018   Monitor 9/21: normal sinus rhythm, sinus tachycardia, Avg HR 110; occ PVCs   Hypertension    Iron deficiency    Irregular heartbeat 03/04/2018   Mild mitral regurgitation    Obesity    Osteochondroma    PVC's (premature  ventricular contractions)    Scalp mass 10/09/2012   Sinus tachycardia    Type 2 diabetes mellitus [facility])     Past Surgical History:  Procedure Laterality Date   ANKLE FRACTURE SURGERY Right    BIOPSY  02/02/2023   Procedure: BIOPSY;  Surgeon: Wilhelmenia Aloha Raddle., MD;  Location: [facility];  Service: Gastroenterology;;   ROMAYNE Right    cyst removal from scalp     ESOPHAGOGASTRODUODENOSCOPY N/A 02/02/2023   Procedure: ESOPHAGOGASTRODUODENOSCOPY (EGD);  Surgeon: Wilhelmenia Aloha Raddle., MD;  Location: [facility];  Service: Gastroenterology;  Laterality: N/A;   EUS N/A 02/02/2023   Procedure: UPPER ENDOSCOPIC ULTRASOUND (EUS) RADIAL;  Surgeon: Wilhelmenia Aloha Raddle., MD;  Location: [facility];  Service: Gastroenterology;  Laterality: N/A;   left leg bone removal     ROBOTIC ASSITED PARTIAL NEPHRECTOMY Left 07/04/2023   Procedure: XI ROBOTIC ASSITED LEFT  PARTIAL NEPHRECTOMY;  Surgeon: Devere Lonni Righter, MD;  Location: [facility];  Service: Urology;  Laterality: Left;  180 MINUTES     reports that she has quit smoking. Her smoking use included cigarettes. She has a 9 pack-year smoking history. She has never been exposed  to tobacco smoke. She has never used smokeless tobacco. She reports current alcohol use. She reports that she does not use drugs.  Allergies  Allergen Reactions   Ozempic  (0.25 Or 0.5 Mg-Dose) [Semaglutide (0.25 Or 0.5mg -Dos)] Other (See Comments)    Abdominal pain    Family History  Problem Relation Age of Onset   Diabetes Mother    Hypertension Mother    Obesity Mother    Hypertension Father    Cancer Paternal Aunt        pancreatic   Cancer Maternal Grandmother        lung   Autism spectrum disorder Sister     Prior to Admission medications   Medication Sig Start Date End Date Taking? Authorizing Provider  acetaminophen  (TYLENOL ) 325 MG tablet Take 2 tablets (650 mg total) by mouth every 6 (six) hours as needed for mild pain (pain score  1-3), fever or moderate pain (pain score 4-6) (or Fever >/= 101). 09/03/23   Hongalgi, Trenda BIRCH, MD  aspirin  EC 81 MG tablet Take 81 mg by mouth in the morning. Swallow whole.    [provider]  atorvastatin  (LIPITOR) 10 MG tablet Take 1 tablet (10 mg total) by mouth daily. 06/06/23   Paseda, [name], FNP  ferrous sulfate  325 (65 FE) MG EC tablet Take 1 tablet (325 mg total) by mouth daily. 06/13/23   Paseda, [name], FNP  glucose blood (GNP TRUE METRIX GLUCOSE STRIPS) test strip Use as instructed 04/19/22   Paseda, [name], FNP  HYDROcodone -acetaminophen  (NORCO/VICODIN) 5-325 MG tablet Take 1 tablet by mouth every 6 (six) hours as needed for severe pain (pain score 7-10). 10/30/23   Tanda Bleacher, MD  hyoscyamine  (LEVSIN  SL) 0.125 MG SL tablet Place 1 tablet (0.125 mg total) under the tongue every 6 (six) hours as needed. Patient not taking: Reported on 10/30/2023 10/26/23   Heinz, Sara E, PA-C  Lancets (FREESTYLE) lancets Use as directed 04/19/22   Paseda, [name], FNP  metFORMIN  (GLUCOPHAGE -XR) 500 MG 24 hr tablet Take 500 mg with breakfast daily by mouth  for one week , then increase to 500 mg by mouth twice daily with meal 06/07/23   Paseda, [name], FNP  metoprolol  succinate (TOPROL -XL) 50 MG 24 hr tablet Take 1 tablet (50 mg total) by mouth daily. Take with or immediately following a meal. Please schedule follow up appt for more refills 09/07/23   Wyn Jackee VEAR Mickey., NP  pantoprazole  (PROTONIX ) 40 MG tablet Take 1 tablet (40 mg total) by mouth 2 (two) times daily. 10/03/23   Heinz, Camie BRAVO, PA-C  traZODone  (DESYREL ) 50 MG tablet Take 1 tablet (50 mg total) by mouth at bedtime as needed for sleep 05/03/23   Paseda, [name], FNP    Physical Exam: Vitals:   11/08/23 1334 11/08/23 1337 11/08/23 1648 11/08/23 1649  BP: (!) 162/90 (!) 160/83 (!) 171/106 (!) 167/91  Pulse: 63  67 69  Resp: 19  18 18   Temp: (!) 97.4 F (36.3 C)  (!) 97.5 F (36.4 C)   TempSrc: Oral  Oral    SpO2: 100%  100% 100%  Weight:      Height:        Physical Exam Vitals and nursing note reviewed.  Constitutional:      General: She is not in acute distress.    Appearance: She is obese. She is not toxic-appearing.  HENT:     Head: Normocephalic and atraumatic.     Nose: Nose  normal.  Eyes:     General: No scleral icterus. Cardiovascular:     Rate and Rhythm: Normal rate and regular rhythm.  Pulmonary:     Effort: Pulmonary effort is normal.     Breath sounds: Normal breath sounds.  Abdominal:     General: Bowel sounds are normal. There is no distension.     Comments: RUQ pain with palpation. No pain along costal margin. Minimal epigastric pain. No abd pain at all with LLQ deep palpation  Musculoskeletal:     Right lower leg: No edema.     Left lower leg: No edema.  Skin:    General: Skin is warm and dry.     Capillary Refill: Capillary refill takes less than 2 seconds.  Neurological:     General: No focal deficit present.     Mental Status: She is alert and oriented to person, place, and time.      Labs on Admission: I have personally reviewed following labs and imaging studies  CBC: Recent Labs  Lab 11/08/23 1243  WBC 4.0  NEUTROABS 2.3  HGB 10.5*  HCT 35.6*  MCV 78.1*  PLT 420*   Basic Metabolic Panel: Recent Labs  Lab 11/08/23 1243  NA 138  K 3.6  CL 104  CO2 19*  GLUCOSE 109*  BUN 9  CREATININE 0.87  CALCIUM  9.3   GFR: Estimated Creatinine Clearance: 88 mL/min (by C-G formula based on SCr of 0.87 mg/dL). Liver Function Tests: Recent Labs  Lab 11/08/23 1243  AST 28  ALT 22  ALKPHOS 102  BILITOT 0.4  PROT 7.7  ALBUMIN 4.1   Recent Labs  Lab 11/08/23 1243  LIPASE 56*   Urine analysis:    Component Value Date/Time   COLORURINE YELLOW 11/08/2023 1334   APPEARANCEUR CLEAR 11/08/2023 1334   LABSPEC 1.015 11/08/2023 1334   PHURINE 5.0 11/08/2023 1334   GLUCOSEU NEGATIVE 11/08/2023 1334   HGBUR NEGATIVE 11/08/2023 1334    BILIRUBINUR NEGATIVE 11/08/2023 1334   KETONESUR NEGATIVE 11/08/2023 1334   PROTEINUR 100 (A) 11/08/2023 1334   NITRITE NEGATIVE 11/08/2023 1334   LEUKOCYTESUR NEGATIVE 11/08/2023 1334    Radiological Exams on Admission: I have personally reviewed images CT ABDOMEN PELVIS W CONTRAST Result Date: 11/08/2023 EXAM: CT ABDOMEN AND PELVIS WITH CONTRAST 11/08/2023 02:21:36 PM TECHNIQUE: CT of the abdomen and pelvis was performed with the administration of 100 mL iohexol  (OMNIPAQUE ) 300 MG/ML solution. Multiplanar reformatted images are provided for review. Automated exposure control, iterative reconstruction, and/or weight-based adjustment of the mA/kV was utilized to reduce the radiation dose to as low as reasonably achievable. COMPARISON: MRI 02/26/2023 and CT scan 09/14/2023. CLINICAL HISTORY: Intra-abdominal abscess. Abdominal pain for months, referred from PCP to ED. FINDINGS: LOWER CHEST: No acute abnormality. LIVER: Faint septations in a 1.9 x 1.4 cm right hepatic lobe cyst on image 10 series 2 as reported on recent prior MRI. No nodularity. Technically speaking, cyst adenoma is not completely excluded. GALLBLADDER AND BILE DUCTS: Continued extrahepatic biliary dilatation with common bile duct 1.0 cm diameter with distal tapering in the vicinity of the ampulla. Gallbladder unremarkable. SPLEEN: No acute abnormality. PANCREAS: No acute abnormality. ADRENAL GLANDS: No acute abnormality. KIDNEYS, URETERS AND BLADDER: Unchanged appearance of left kidney lower pole partial nephrectomy. No stones in the kidneys or ureters. No hydronephrosis. No perinephric or periureteral stranding. Urinary bladder is unremarkable. GI AND BOWEL: Stomach demonstrates no acute abnormality. Scattered colonic diverticulosis, concentrated in the sigmoid colon. Focal active diverticulitis in the sigmoid  colon is observed on images 57 through 60 of series 2, with associated stranding in the adjacent sigmoid mesentery but no appreciable  abscess or extraluminal gas. The adjacent appendix appears unremarkable. Overall appearance compatible with mild-to-moderate acute sigmoid colon diverticulitis; this corresponds in position to site of prior inflammation along the sigmoid colon shown on 09/14/2023. There is no bowel obstruction. PERITONEUM AND RETROPERITONEUM: No ascites. No free air. VASCULATURE: Aorta is normal in caliber. LYMPH NODES: No lymphadenopathy. REPRODUCTIVE ORGANS: Mild lobularity of the uterus suggesting uterine fibroids. BONES AND SOFT TISSUES: Sizeable lateral and smaller medial osteochondromas of the left iliac bone. Suspected small osteochondroma of the right posterior proximal femoral metaphysis. Mild marginal lobularity in the iliac crests. Appearance favors multiple hereditary exostosis. No focal soft tissue abnormality. IMPRESSION: 1. Mild-to-moderate acute sigmoid diverticulitis without abscess or extraluminal gas, similar in location to 09/14/2023 . If this continues to fail to resolve despite appropriate therapy, further workup to exclude unusual predisposing factors such as an underlying tumor may be indicated. 2. Stable extrahepatic biliary dilatation with 1.0 cm common bile duct and distal tapering near the ampulla. 3. Right hepatic lobe cyst with faint septations; cystadenoma not fully excluded. 4. Postsurgical changes from left lower pole partial nephrectomy, unchanged. 5. Colonic diverticulosis, greatest in the sigmoid colon. 6. 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 7. Mild uterine lobularity suggesting fibroids. Electronically signed by: Ryan Salvage MD 11/08/2023 02:59 PM EDT RP Workstation: [id number]   US  Abdomen Limited RUQ (LIVER/GB) Result Date: 11/08/2023 CLINICAL DATA:  Right upper quadrant pain several months. EXAM: ULTRASOUND ABDOMEN LIMITED RIGHT UPPER QUADRANT COMPARISON:  CT abdomen 09/14/2023 and MRI abdomen 10/27/2023. FINDINGS:  Gallbladder: No evidence of gallstones or wall thickening. No adjacent free fluid. Negative sonographic Murphy sign. Common bile duct: Diameter: Common bile duct measures 10.8 mm which is unchanged from recent CT and MRI. Liver: Mild increased parenchymal echogenicity compatible with a degree of steatosis. 2 cm cyst over the right lobe. Portal vein is patent on color Doppler imaging with normal direction of blood flow towards the liver. Other: No free fluid. IMPRESSION: 1. No acute findings. 2. Stable dilatation of the common bile duct measuring 10.8 mm with no evidence of ductal stone or mass on recent MRI. 3. Mild hepatic steatosis.  2 cm liver cyst. Electronically Signed   By: Toribio Agreste M.D.   On: 11/08/2023 12:52   EKG: My personal interpretation of EKG shows: no EKG  Assessment/Plan Principal Problem:   RUQ abdominal pain Active Problems:   Obesity, Class I, BMI 30-34.9   Essential hypertension   Non-insulin  dependent type 2 diabetes mellitus (HCC)   Iron deficiency anemia   Dilated cbd, acquired    Assessment and Plan: * RUQ abdominal pain 11/08/23 patient has had right upper quadrant abdominal pain for several months now.  It has NOT gotten any worse in severity.  It does happen when she moves around.  She notices it more when she is having a bowel movement.  It is especially worse when she is constipated.  Her right upper quadrant pain is not associated with any food.  She denies any problems eating greasy or fried food.  She has really no dyspepsia with eating.  She has a HIDA scan ordered to rule out biliary dyskinesia but I doubt it is going to show anything.  She has a chronically dilated common bile duct.  Her endoscopic ultrasound last year and her recent MRCP did not show any common  bile duct stones.   have reviewed her CTs starting from December 05, 2022, August 29, 2023, September 14, 2023 and today's CT.  They all show the same area of inflammation and stranding near the sigmoid  colon.  I highly doubt that she has had persistent acute diverticulitis for nearly 12 months now.  I really do not think that she has active diverticulitis of the sigmoid colon as she absolutely has no pain whatsoever in the left lower quadrant.  I do not think she needs any more IV antibiotics.  If her HIDA scan is negative tomorrow, I think a course of corticosteroids such as prednisone for a week to see if there is any inflammation in the right upper quadrant that can be calmed down.  Patient specifically denies a history of gonorrhea or chlamydia.  Making Fitz-Hugh Curtis syndrome very unlikely.  She does state that she has had trichomonas before but never has had gonorrhea or PID.  She will be n.p.o. after midnight for HIDA scan.  Place her on fluids.  Place her on scheduled Toradol .  Place her on scheduled Protonix  for GI prophylaxis.    Dilated cbd, acquired 11/08/23 chronically dilated  common bile duct at around 10 mm.  There is been no stones or sludge noted on MRCP.  I do not think ERCP or GI consult will be of any benefit.   Iron deficiency anemia 11/08/23 patient is been taking iron supplements since June or July of this year.  This has improved her hemoglobin.  She will continue with iron therapy as an outpatient.  Patient still having menstrual cycles.   Non-insulin  dependent type 2 diabetes mellitus (HCC) 11/08/23 check A1c.  She is can be n.p.o. after midnight.  No need for sliding scale insulin  at this time.   Essential hypertension 11/08/23 continue toprol -xl   Obesity, Class I, BMI 30-34.9 Body mass index is 33.28 kg/m.    DVT prophylaxis: SCDs Code Status: Full Code Family Communication: discussed with pt and her son Kaylee Maxwell at bedside  Disposition Plan: home  Consults called: general surgery  Admission status: Observation, Med-Surg   Camellia Door, DO [facility] 11/08/2023, 5:47 PM

## 2023-11-08 NOTE — ED Provider Notes (Signed)
 [place] [facility]ERG[facility] DEPARTMENT AT [facility] Provider Note   CSN: [id number] Arrival date & time: 11/08/23  1057     Patient presents with: Abdominal Pain   Kaylee Maxwell is a 48 y.o. female.   HPI     48 year old female with a history of hypertension, hyperlipidemia, type 2 diabetes, renal mass/renal cell carcinoma status post left partial nephrectomy Jul 04, 2023, IDA, palpitations/PVCs, gastritis/duodenitis, anxiety, diverticulitis, previous upper EGD for evaluation of CBD dilation and pancreatic cyst, hospitalization in July for acute diverticulitis with small abscess not amenable to IR drainage, repeat CT completed in July with oncology that showed persistent sigmoid colon diverticulitis with fluid in the pelvis/sigmoid mesentery and inflamed colonic diverticulum, concerning for perforation after which she was admitted to the [facility] long hospital July 24 to 28, repeat MRCP completed September 19 which showed redemonstration of mildly dilated extrahepatic bile duct measuring up to 10 mm, no choledocholithiasis or obstructing mass, no intrahepatic bile duct dilation, redemonstration of 6 x 6 mm hyperintense nonenhancing lesion in the pancreatic head, 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   She reports that this abdominal pain is the same pain that she is been having over the last several months that had led to these other procedures and findings.  She reports maybe the right upper quadrant pain is worse today.  She reports the pain from her diverticulitis has since resolved but the right upper quadrant pain has persisted.  Denies fevers, diarrhea, constipation, urinary symptoms, vaginal bleeding or discharge.  She has had nausea.  Her biggest concern is severe and continuous right upper quadrant abdominal pain.  She feels that she has been battling this pain for a  long time and did not feel really heard until this morning.   Past Medical History:  Diagnosis Date   Abscess of sigmoid colon due to diverticulitis 08/08/2023   Anxiety 03/04/2018   Breast cancer screening 03/04/2018   Heart palpitations 03/04/2018   Monitor 9/21: normal sinus rhythm, sinus tachycardia, Avg HR 110; occ PVCs   Hypertension    Iron deficiency    Irregular heartbeat 03/04/2018   Mild mitral regurgitation    Obesity    Osteochondroma    PVC's (premature ventricular contractions)    Scalp mass 10/09/2012   Sinus tachycardia    Type 2 diabetes mellitus (HCC)      Prior to Admission medications   Medication Sig Start Date End Date Taking? Authorizing Provider  acetaminophen  (TYLENOL ) 325 MG tablet Take 2 tablets (650 mg total) by mouth every 6 (six) hours as needed for mild pain (pain score 1-3), fever or moderate pain (pain score 4-6) (or Fever >/= 101). 09/03/23  Yes Hongalgi, Trenda BIRCH, MD  aspirin  EC 81 MG tablet Take 81 mg by mouth in the morning. Swallow whole.   Yes [provider]  atorvastatin  (LIPITOR) 10 MG tablet Take 1 tablet (10 mg total) by mouth daily. 06/06/23  Yes Paseda, [name], FNP  ferrous sulfate  325 (65 FE) MG EC tablet Take 1 tablet (325 mg total) by mouth daily. Patient taking differently: Take 325 mg by mouth at bedtime. 06/13/23  Yes Paseda, [name], FNP  HYDROcodone -acetaminophen  (NORCO/VICODIN) 5-325 MG tablet Take 1 tablet by mouth every 6 (six) hours as needed for severe pain (pain score 7-10). 10/30/23  Yes Tanda Bleacher, MD  hyoscyamine  (LEVSIN  SL) 0.125 MG SL tablet Place 1 tablet (  0.125 mg total) under the tongue every 6 (six) hours as needed. Patient taking differently: Place 0.125 mg under the tongue every 6 (six) hours as needed for cramping. 10/26/23  Yes Arletta Camie BRAVO, PA-C  metFORMIN  (GLUCOPHAGE -XR) 500 MG 24 hr tablet Take 500 mg with breakfast daily by mouth  for one week , then increase to 500 mg by mouth twice daily  with meal Patient taking differently: Take 500 mg by mouth in the morning and at bedtime. 06/07/23  Yes Paseda, [name], FNP  metoprolol  succinate (TOPROL -XL) 50 MG 24 hr tablet Take 1 tablet (50 mg total) by mouth daily. Take with or immediately following a meal. Please schedule follow up appt for more refills 09/07/23  Yes Wyn Jackee VEAR Raddle., NP  NON FORMULARY Take by mouth See admin instructions. Seaweed extract - Place 1 dropperful in the mouth, then swallow once a day   Yes [provider]  NON FORMULARY Take by mouth See admin instructions. Soursop fruit extract - Place 1 dropperful in the mouth, then swallow once a day   Yes [provider]  OREGANO PO Take by mouth See admin instructions. Oregano oil - Place 1 dropperful in the mouth, then swallow once a day   Yes [provider]  pantoprazole  (PROTONIX ) 40 MG tablet Take 1 tablet (40 mg total) by mouth 2 (two) times daily. Patient taking differently: Take 40 mg by mouth in the morning and at bedtime. 10/03/23  Yes Heinz, Camie BRAVO, PA-C  traZODone  (DESYREL ) 50 MG tablet Take 1 tablet (50 mg total) by mouth at bedtime as needed for sleep 05/03/23  Yes Paseda, [name], FNP  glucose blood (GNP TRUE METRIX GLUCOSE STRIPS) test strip Use as instructed 04/19/22   Paseda, [name], FNP  Lancets (FREESTYLE) lancets Use as directed 04/19/22   Paseda, [name], FNP    Allergies: Ozempic  (0.25 or 0.5 mg-dose) [semaglutide (0.25 or 0.5mg -dos)]    Review of Systems  Updated Vital Signs BP (!) 157/93 (BP Location: Left Arm)   Pulse 67   Temp 98.4 F (36.9 C) (Oral)   Resp 18   Ht 5' 5 (1.651 m)   Wt 90.7 kg   SpO2 100%   BMI 33.28 kg/m   Physical Exam Vitals and nursing note reviewed.  Constitutional:      General: She is not in acute distress.    Appearance: She is well-developed. She is not diaphoretic.  HENT:     Head: Normocephalic and atraumatic.  Eyes:     Conjunctiva/sclera: Conjunctivae normal.   Cardiovascular:     Rate and Rhythm: Normal rate and regular rhythm.     Heart sounds: Normal heart sounds. No murmur heard.    No friction rub. No gallop.  Pulmonary:     Effort: Pulmonary effort is normal. No respiratory distress.     Breath sounds: Normal breath sounds. No wheezing or rales.  Abdominal:     General: There is no distension.     Palpations: Abdomen is soft.     Tenderness: There is abdominal tenderness in the right upper quadrant. There is no guarding. Positive signs include Murphy's sign.  Musculoskeletal:        General: No tenderness.     Cervical back: Normal range of motion.  Skin:    General: Skin is warm and dry.     Findings: No erythema or rash.  Neurological:     Mental Status: She is alert and oriented to person, place,  and time.     (all labs ordered are listed, but only abnormal results are displayed) Labs Reviewed  CBC WITH DIFFERENTIAL/PLATELET - Abnormal; Notable for the following components:      Result Value   Hemoglobin 10.5 (*)    HCT 35.6 (*)    MCV 78.1 (*)    MCH 23.0 (*)    MCHC 29.5 (*)    RDW 15.7 (*)    Platelets 420 (*)    All other components within normal limits  COMPREHENSIVE METABOLIC PANEL WITH GFR - Abnormal; Notable for the following components:   CO2 19 (*)    Glucose, Bld 109 (*)    All other components within normal limits  URINALYSIS, W/ REFLEX TO CULTURE (INFECTION SUSPECTED) - Abnormal; Notable for the following components:   Protein, ur 100 (*)    Bacteria, UA RARE (*)    All other components within normal limits  LIPASE, BLOOD - Abnormal; Notable for the following components:   Lipase 56 (*)    All other components within normal limits  C-REACTIVE PROTEIN - Abnormal; Notable for the following components:   CRP 1.1 (*)    All other components within normal limits  I-STAT CG4 LACTIC ACID, ED - Abnormal; Notable for the following components:   Lactic Acid, Venous 2.2 (*)    All other components within normal  limits  HCG, SERUM, QUALITATIVE  SEDIMENTATION RATE  CEA  CANCER ANTIGEN 19-9  CA 125  CBC WITH DIFFERENTIAL/PLATELET  COMPREHENSIVE METABOLIC PANEL WITH GFR  I-STAT CG4 LACTIC ACID, ED    EKG: None  Radiology: CT ABDOMEN PELVIS W CONTRAST Result Date: 11/08/2023 EXAM: CT ABDOMEN AND PELVIS WITH CONTRAST 11/08/2023 02:21:36 PM TECHNIQUE: CT of the abdomen and pelvis was performed with the administration of 100 mL iohexol  (OMNIPAQUE ) 300 MG/ML solution. Multiplanar reformatted images are provided for review. Automated exposure control, iterative reconstruction, and/or weight-based adjustment of the mA/kV was utilized to reduce the radiation dose to as low as reasonably achievable. COMPARISON: MRI 02/26/2023 and CT scan 09/14/2023. CLINICAL HISTORY: Intra-abdominal abscess. Abdominal pain for months, referred from PCP to ED. FINDINGS: LOWER CHEST: No acute abnormality. LIVER: Faint septations in a 1.9 x 1.4 cm right hepatic lobe cyst on image 10 series 2 as reported on recent prior MRI. No nodularity. Technically speaking, cyst adenoma is not completely excluded. GALLBLADDER AND BILE DUCTS: Continued extrahepatic biliary dilatation with common bile duct 1.0 cm diameter with distal tapering in the vicinity of the ampulla. Gallbladder unremarkable. SPLEEN: No acute abnormality. PANCREAS: No acute abnormality. ADRENAL GLANDS: No acute abnormality. KIDNEYS, URETERS AND BLADDER: Unchanged appearance of left kidney lower pole partial nephrectomy. No stones in the kidneys or ureters. No hydronephrosis. No perinephric or periureteral stranding. Urinary bladder is unremarkable. GI AND BOWEL: Stomach demonstrates no acute abnormality. Scattered colonic diverticulosis, concentrated in the sigmoid colon. Focal active diverticulitis in the sigmoid colon is observed on images 57 through 60 of series 2, with associated stranding in the adjacent sigmoid mesentery but no appreciable abscess or extraluminal gas. The  adjacent appendix appears unremarkable. Overall appearance compatible with mild-to-moderate acute sigmoid colon diverticulitis; this corresponds in position to site of prior inflammation along the sigmoid colon shown on 09/14/2023. There is no bowel obstruction. PERITONEUM AND RETROPERITONEUM: No ascites. No free air. VASCULATURE: Aorta is normal in caliber. LYMPH NODES: No lymphadenopathy. REPRODUCTIVE ORGANS: Mild lobularity of the uterus suggesting uterine fibroids. BONES AND SOFT TISSUES: Sizeable lateral and smaller medial osteochondromas of the left iliac  bone. Suspected small osteochondroma of the right posterior proximal femoral metaphysis. Mild marginal lobularity in the iliac crests. Appearance favors multiple hereditary exostosis. No focal soft tissue abnormality. IMPRESSION: 1. Mild-to-moderate acute sigmoid diverticulitis without abscess or extraluminal gas, similar in location to 09/14/2023 . If this continues to fail to resolve despite appropriate therapy, further workup to exclude unusual predisposing factors such as an underlying tumor may be indicated. 2. Stable extrahepatic biliary dilatation with 1.0 cm common bile duct and distal tapering near the ampulla. 3. Right hepatic lobe cyst with faint septations; cystadenoma not fully excluded. 4. Postsurgical changes from left lower pole partial nephrectomy, unchanged. 5. Colonic diverticulosis, greatest in the sigmoid colon. 6. 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 7. Mild uterine lobularity suggesting fibroids. Electronically signed by: Ryan Salvage MD 11/08/2023 02:59 PM EDT RP Workstation: [id number]   US  Abdomen Limited RUQ (LIVER/GB) Result Date: 11/08/2023 CLINICAL DATA:  Right upper quadrant pain several months. EXAM: ULTRASOUND ABDOMEN LIMITED RIGHT UPPER QUADRANT COMPARISON:  CT abdomen 09/14/2023 and MRI abdomen 10/27/2023. FINDINGS: Gallbladder: No evidence of gallstones or  wall thickening. No adjacent free fluid. Negative sonographic Murphy sign. Common bile duct: Diameter: Common bile duct measures 10.8 mm which is unchanged from recent CT and MRI. Liver: Mild increased parenchymal echogenicity compatible with a degree of steatosis. 2 cm cyst over the right lobe. Portal vein is patent on color Doppler imaging with normal direction of blood flow towards the liver. Other: No free fluid. IMPRESSION: 1. No acute findings. 2. Stable dilatation of the common bile duct measuring 10.8 mm with no evidence of ductal stone or mass on recent MRI. 3. Mild hepatic steatosis.  2 cm liver cyst. Electronically Signed   By: Toribio Agreste M.D.   On: 11/08/2023 12:52     Procedures   Medications Ordered in the ED  ketorolac  (TORADOL ) 30 MG/ML injection 30 mg (30 mg Intravenous Given 11/08/23 1849)  pantoprazole  (PROTONIX ) EC tablet 40 mg (has no administration in time range)  lactated ringers  infusion ( Intravenous New Bag/Given 11/08/23 1852)  metoprolol  succinate (TOPROL -XL) 24 hr tablet 50 mg (50 mg Oral Not Given 11/08/23 2057)  acetaminophen  (TYLENOL ) tablet 650 mg (has no administration in time range)    Or  acetaminophen  (TYLENOL ) suppository 650 mg (has no administration in time range)  ondansetron  (ZOFRAN ) tablet 4 mg (has no administration in time range)    Or  ondansetron  (ZOFRAN ) injection 4 mg (has no administration in time range)  dicyclomine (BENTYL) capsule 10 mg (10 mg Oral Given 11/08/23 1850)  HYDROmorphone  (DILAUDID ) injection 1 mg (1 mg Intravenous Given 11/08/23 1242)  ondansetron  (ZOFRAN ) injection 4 mg (4 mg Intravenous Given 11/08/23 1241)  lactated ringers  bolus 1,000 mL (1,000 mLs Intravenous New Bag/Given 11/08/23 1241)  iohexol  (OMNIPAQUE ) 300 MG/ML solution 100 mL (100 mLs Intravenous Contrast Given 11/08/23 1416)  piperacillin -tazobactam (ZOSYN ) IVPB 3.375 g (3.375 g Intravenous New Bag/Given 11/08/23 1710)                                      48 year old female with a history of hypertension, hyperlipidemia, type 2 diabetes, renal mass/renal cell carcinoma status post left partial nephrectomy Jul 04, 2023, IDA, palpitations/PVCs, gastritis/duodenitis, anxiety, diverticulitis, previous upper EGD for evaluation of CBD dilation and pancreatic cyst, hospitalization in July for acute diverticulitis with small abscess not amenable to IR drainage, repeat CT completed in  July with oncology that showed persistent sigmoid colon diverticulitis with fluid in the pelvis/sigmoid mesentery and inflamed colonic diverticulum, concerning for perforation after which she was admitted to the [facility] long hospital July 24 to 28, repeat MRCP completed September 19 which showed redemonstration of mildly dilated extrahepatic bile duct measuring up to 10 mm, no choledocholithiasis or obstructing mass, no intrahepatic bile duct dilation, redemonstration of 6 x 6 mm hyperintense nonenhancing lesion in the pancreatic head, 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  DDx includes cholangitis, Coley look cholelithiasis, cholelithiasis, cholecystitis, pancreatitis, recurrent diverticulitis, intra-abdominal abscess, bowel obstruction, pyelonephritis, nephrolithiasis.  On initial exam, biggest concern for rectal quadrant etiology of pain and with ultrasound being sensitive, started with right upper, ultrasound shows stable dilation of the common bile duct measuring 10.8 mm with no evidence of ductal stone or mass, mild hepatic steatosis.  Labs advised interpreted by me show no evidence of biliary obstruction with a normal bilirubin, normal transaminases, hemoglobin is 10.5, white blood cell count is normal, lactic acid is mildly elevated.  Urinalysis is without signs of infection.  Given her complicated recent history and severity of abdominal pain, will obtain CT abdomen  pelvis.  CT shows appearance of acute sigmoid diverticulitis similar in location to 8/7, if this continues to fail to resolve despite appropriate therapy, further work up to exclude other factors such as tumor indicated.    Given severity of pain, concern for persistent diverticulitis, will admit for IV abx.  Clinically, continued concern for other etiology of RUQ abdominal pain. Ordered HIDA scan. Will admit to hospital with general surgery following. GI aware.  Given zosyn , pain control. Labs ordered to evaluate for ?other underlying malignancy in setting of persistent abnormality on CT>        Final diagnoses:  RUQ abdominal pain  Diverticulitis    ED Discharge Orders     None          Dreama Longs, MD 11/08/23 2139

## 2023-11-08 NOTE — Assessment & Plan Note (Signed)
 11/08/23 continue toprol -xl

## 2023-11-08 NOTE — Progress Notes (Addendum)
 REFERRING PHYSICIAN:  Aloha Wilhelmenia Raddle.* PROVIDER:  RICHERD SILVERSMITH, MD MRN: [id number] DOB: [birth year] DATE OF ENCOUNTER: 11/08/2023 Subjective    Reason for Referral: diverticulitis History of Present Illness: Kaylee Maxwell is a 48 y.o. female with history of HTN, HLD, T2DM, RCC s/p partial left nephrectomy, IDA, palpitations/PVCs, gastritis/duodenitis, anxiety, CBD dilation and pancreatic cyst who is seen today as an office consultation for evaluation of diverticulitis.  Per Chart Review: Patient was hospitalized 08/08/23-08/10/23 for acute diverticulitis with small abscess not amenable to IR drainage, treated with IV antibiotics and finished 7 day course total with cipro /flagyl . Repeat imaging 08/22/23 ordered by oncology showed persistent sigmoid diverticulitis with small foci of gas concerning for microperforation. Patient was readmitted to [facility] 7/24-7/28 without evidence of perforation of abscess. Patient also noted to have iron deficiency anemia during this admission.  Patient was seen again in ED on 8/7 for abdominal pain. Noted some BRBPR when wiping as well as in toilet. Pain primarily right sided. CT AP at that time showed improving uncomplicated sigmoid diverticulitis without perforation or abscess. CBD was dilated to 12mm and has been dilated in the past.  She has had EUS on 02/02/23 by Dr. Wilhelmenia which showed no evidence of choledocholithiasis or CBD stricture and no evidence of cholelithiasis or microlithiasis. Single pancreatic parenchymal foci noted at pancreatic head, main pancreatic duct normal appearance, no intramural lesion, no malignant appearing lymph nodes.  She was last seen by GI on 8/26 and stated at that time that she had persistent RUQ pain which was different from her diverticulitis pain which was primarily in the lower abdomen. Pain initially began in November. Pain is sharp and worse when she lays on her right side. Worse with coughing, laughing or sudden  movements as well as just when sitting still.  Patient had MRCP on 9/19 which redemonstrated known CBD bile duct dilation to 10mm without choledocholithiasis or obstructing mass.  Today patient states her main problem is RUQ pain. It is constant and severe. She seems in acute distress secondary to the pain. She says she is eating ok but she does have intermittent nausea. Her quality of life has suffered due to the constant pain and discomfort. She denies fevers/chills. She does not notice any difference with PO intake or diet in regards to the pain, just that there is no relief.     Review of Systems: A complete review of systems was obtained from the patient.  I have reviewed this information and discussed as appropriate with the patient.  ROS otherwise negative except as noted in HPI.   Medical History: Past Medical History:  Diagnosis Date  . Anxiety   . Heart palpitations   . Hypertension   . Iron deficiency   . Irregular heartbeat   . Mild mitral regurgitation   . Obesity   . Osteochondroma   . PVC's (premature ventricular contractions)   . Scalp mass   . Type 2 diabetes mellitus (CMS/HHS-HCC)     There is no problem list on file for this patient.   Past Surgical History:  Procedure Laterality Date  . Esophagogastroduodenoscopy (N/A)  02/02/2023  . Robotic assited partial nephrectomy Left 07/04/2023  . Ankle fracture surgery (Right)    . Bunionectomy (Right)    . cyst removal from scalp [Other]    . left leg bone removal [Other]       Allergies  Allergen Reactions  . Semaglutide  Abdominal Pain    Abdominal pain    Current Outpatient  Medications on File Prior to Visit  Medication Sig Dispense Refill  . atorvastatin  (LIPITOR) 10 MG tablet Take 10 mg by mouth once daily    . ferrous sulfate  325 (65 FE) MG EC tablet Take 325 mg by mouth once daily    . hyoscyamine  (LEVSIN /SL) 0.125 mg SL tablet Place 0.125 mg under the tongue every 6 (six) hours as needed    .  metFORMIN  (GLUCOPHAGE -XR) 500 MG XR tablet Take 500 mg with breakfast daily by mouth  for one week , then increase to 500 mg by mouth twice daily with meal    . metoprolol  SUCCinate (TOPROL -XL) 50 MG XL tablet Take 50 mg by mouth    . pantoprazole  (PROTONIX ) 40 MG DR tablet Take 40 mg by mouth 2 (two) times daily    . traZODone  (DESYREL ) 50 MG tablet Take 50 mg by mouth at bedtime as needed for Sleep     No current facility-administered medications on file prior to visit.    Family History  Problem Relation Age of Onset  . Diabetes Mother   . High blood pressure (Hypertension) Mother   . Obesity Mother   . High blood pressure (Hypertension) Father   . Autism spectrum disorder Sister   . Pancreatic cancer Paternal Aunt   . Lung cancer Maternal Grandmother      Social History   Tobacco Use  Smoking Status Former  . Types: Cigarettes  . Passive exposure: Never  Smokeless Tobacco Never     Social History   Socioeconomic History  . Marital status: Single  Tobacco Use  . Smoking status: Former    Types: Cigarettes    Passive exposure: Never  . Smokeless tobacco: Never  Substance and Sexual Activity  . Alcohol use: Yes    Alcohol/week: 0.0 - 1.0 standard drinks of alcohol  . Drug use: Never  . Sexual activity: Defer   Social Drivers of Health   Financial Resource Strain: Medium Risk (09/08/2023)   Received from [facility]   Overall Financial Resource Strain (CARDIA)   . How hard is it for you to pay for the very basics like food, housing, medical care, and heating?: Somewhat hard  Food Insecurity: No Food Insecurity (09/08/2023)   Received from [facility]   Hunger Vital Sign   . Within the past 12 months, you worried that your food would run out before you got the money to buy more.: Never true   . Within the past 12 months, the food you bought just didn't last and you didn't have money to get more.: Never true  Transportation Needs: No Transportation Needs (09/08/2023)    Received from [facility] - Transportation   . In the past 12 months, has lack of transportation kept you from medical appointments or from getting medications?: No   . In the past 12 months, has lack of transportation kept you from meetings, work, or from getting things needed for daily living?: No  Physical Activity: Unknown (09/08/2023)   Received from [facility]   Exercise Vital Sign   . On average, how many days per week do you engage in moderate to strenuous exercise (like a brisk walk)?: Patient declined  Stress: No Stress Concern Present (09/08/2023)   Received from [facility] - Occupational Stress Questionnaire   . Do you feel stress - tense, restless, nervous, or anxious, or unable to sleep at night because your mind is troubled all  the time - these days?: Not at all  Social Connections: Moderately Isolated (09/08/2023)   Received from [facility]   Social Connection and Isolation Panel   . In a typical week, how many times do you talk on the phone with family, friends, or neighbors?: Three times a week   . How often do you get together with friends or relatives?: Once a week   . How often do you attend church or religious services?: More than 4 times per year   . Do you belong to any clubs or organizations such as church groups, unions, fraternal or athletic groups, or school groups?: No   . Are you married, widowed, divorced, separated, never married, or living with a partner?: Never married  Housing Stability: Unknown (11/08/2023)   Housing Stability Vital Sign   . Homeless in the Last Year: No    Objective:   Vitals:   11/08/23 0938  BP: (!) 139/90  Pulse: 98  Temp: 36.6 C (97.9 F)  TempSrc: Temporal  SpO2: 99%  Weight: 91 kg (200 lb 9.6 oz)  Height: 165.1 cm (5' 5)  PainSc:   7  PainLoc: Abdomen    Body mass index is 33.38 kg/m.  Physical Exam: General: Moderate distress secondary to pain, somewhat  tearful HEENT: PERRL, hearing grossly normal, mucous membranes moist CV: Regular rate and rhythm Pulm: Normal work of breathing on room air Abd: Soft, severely tender to palpation in RUQ with guarding and positive murphy's sign. Extremities: Warm and well perfused Neuro: A&O x4, no focal neurologic deficits Psych: Appropriate mood and effect  Labs, Imaging and Diagnostic Testing: I have personally reviewed all imaging and agree with radiologist's interpretation. MRCP 9/19: 1. Since the prior study, there is interval resection of previously seen left kidney lower pole lesion. There are expected postsurgical changes in the resection bed. No discrete enhancing residual or recurrent tumor seen. 2. Redemonstration of mildly dilated extrahepatic bile duct measuring up to 10 mm in the proximal portion and gradually tapers near the Ampulla of Vater. No choledocholithiasis or obstructing mass. No intrahepatic bile duct dilation. 3. 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 Follow-up examination with MRI abdomen with MRCP protocol in 1 year is recommended. CT AP 8/7: 1. Persistent but improving uncomplicated sigmoid diverticulitis. No perforation or abscess. 2. Postsurgical changes from partial nephrectomy lower pole left kidney, with decreasing postoperative fluid collection. 3. Stable nonspecific common bile duct dilation. No evidence of cholelithiasis or choledocholithiasis. CT AP 7/24: 1. Persistent sigmoid diverticulitis, without evidence of associated perforation or abscess. 2. Stable hepatic cyst versus hemangioma. 3. Stable left renal cyst and postoperative changes along the lower poleof the left kidney. CT AP 7/23: 1. Postsurgical change of left lower pole partial nephrectomy. There is a 4.1 x 3.0 cm collection in the surgical bed with an enhancing nodular focus along the superior anterior  margin of this collection measuring 7 mm, which is hypoenhancing in comparison to adjacent renal cortex. While this may reflect a small amount of residual renal parenchyma, recommend attention on short-term interval follow-up imaging to exclude residual/recurrent disease. 2. No evidence of metastatic disease in the abdomen or pelvis. 3. Persistent sigmoid colonic diverticulitis with fluid in the pelvis/sigmoid mesentery and inflamed colonic diverticulum. Small focus of gas in the sigmoid mesentery measures 18 mm in maximum dimension, concerning for perforation. Consider further evaluation by CT abdomen and pelvis with enteric contrast  material. 4. Stable 5 mm cystic lesion in the pancreatic head/uncinate process, likely a side branch IPMN. Recommend attention on follow-up imaging.  I have personally reviewed any pertinent labs. Normal LFTs on 8/26 labs, mildly elevated lipase on 8/7 labwork. IDA across multiple lab draws with most recent H/H 10.4/35.1 on 10/03/23.  I have personally reviewed GI clinic notes from 8/26, and EUS/EGD note from 02/02/24.  Assessment and Plan:     Diagnoses and all orders for this visit:  Right upper quadrant pain  Diverticulitis    - Given severity of RUQ pain, I am concerned that there is a possibility of acute cholecystitis. - If workup for acute cholecystitis negative, I would recommend HIDA to evaluate GB EF, given persistent RUQ symptoms. If this is unable to be performed while in ED, we will arrange outpatient - In regard to her diverticulitis, we briefly discussed possibility of surgery but given her acute severe pain will defer discussions until current issue has been resolved. She also needs a colonoscopy prior to any surgical plans for partial colectomy and has this scheduled later this month with GI. - We will follow up with patient after her colonoscopy to discuss surgery for diverticulitis  I spent a total of 68 minutes in both face-to-face and  non-face-to-face activities, excluding procedures performed, for this visit on the date of this encounter.  Orie Silversmith, MD [facility]

## 2023-11-08 NOTE — Assessment & Plan Note (Signed)
Body mass index is 33.28 kg/m.

## 2023-11-08 NOTE — Assessment & Plan Note (Signed)
 11/08/23 patient is been taking iron supplements since June or July of this year.  This has improved her hemoglobin.  She will continue with iron therapy as an outpatient.  Patient still having menstrual cycles.

## 2023-11-08 NOTE — ED Notes (Signed)
 RN informed HIDA scan will take place tomorrow at 10 AM. Patient to be NPO at midnight including opioids.

## 2023-11-08 NOTE — ED Triage Notes (Signed)
 Present with abd pain for months, seen at PCP and referred to come to the ED, see PCP notes

## 2023-11-08 NOTE — ED Notes (Signed)
 RN notified of pt's BP readings.

## 2023-11-08 NOTE — Hospital Course (Addendum)
 CC: RUQ pain HPI: 48 year old African-American female history of obesity, type 2 diabetes, hypertension, iron deficiency anemia, chronically dilated common bile duct, history of sigmoid diverticulitis with abscess, chronic right upper quadrant pain who presents to the ER from the general surgery office due to right upper quadrant pain.  She was seen by general surgery today for a follow-up appointment and sent to the ER due to concern about her gallbladder.  Patient's had a thorough GI workup including upper endoscopy by [facility].  This was during her December 2024 admission for diverticulitis.  She even had a endoscopic ultrasound which was negative for source of her pain.  She has basely had pain for nearly a year now in the right upper quadrant.  Does not change with appetite or food.  She does state that it does tend to hurt more when she has bowel movements and can feel it especially when she gets constipated.  She was most recently seen by [facility] in August 2025 for similar complaints of right upper quadrant pain.  GI did not recommend a repeat upper endoscopy and thought that she only needed yearly MRCP's.  On arrival to the ER temp 90.2 heart rate 78 blood pressure 157/82 satting 100% on room air  Lactic acid was initially elevated 2.2  White count 4.0, hemoglobin 10.5, platelets of 420, MCV of 78  Lipase of 56  Sodium 138, Tessman 3.6, chloride 104, bicarb 19, BUN of 9, creatinine 0.87, glucose of 109  Total protein 7.7, albumin 4.1, AST 28, ALT 22, alk phos 102, total is 3.5  UA  shows.  Specific gravity 1.015, protein 100, negative nitrite, negative leukocyte esterase, 0-5 WBCs, rare bacteria.  Patient had a right upper quadrant ultrasound performed today which showed stable common bile duct measuring at 10.8 mm with negative evidence of ductal stone or mass.  There is no evidence of wall thickening in the gallbladder.  Her CT abdomen pelvis showed a right hepatic cyst.  His  CT reports active diverticulitis in the sigmoid colon with some stranding in the mesentery of the sigmoid area but no abscesses or extraluminal gas.  I have reviewed all of her CTs starting from December 05, 2022, August 29, 2023, September 14, 2023 and today's CT.  They all show the same area of inflammation and stranding near the sigmoid colon.  I highly doubt that she has had persistent acute diverticulitis for nearly 12 months now.  ESR and CRP are pending.  General surgery has been consulted.  HIDA scan has been ordered to rule out gallbladder pathology.  She is n.p.o. after midnight.  No opiates should be ordered.  [facility] hospitalist consulted for admission.  Significant Events: Admitted 11/08/2023 for RUQ pain   Admission Labs: Lactic acid was initially elevated 2.2 White count 4.0, hemoglobin 10.5, platelets of 420, MCV of 78 Lipase of 56 Sodium 138, Tessman 3.6, chloride 104, bicarb 19, BUN of 9, creatinine 0.87, glucose of 109 Total protein 7.7, albumin 4.1, AST 28, ALT 22, alk phos 102, total is 3.5 UA  shows.  Specific gravity 1.015, protein 100, negative nitrite, negative leukocyte esterase, 0-5 WBCs, rare bacteria.  Admission Imaging Studies: RUQ U/S No acute findings. 2. Stable dilatation of the common bile duct measuring 10.8 mm with no evidence of ductal stone or mass on recent MRI. 3. Mild hepatic steatosis.  2 cm liver cyst CT abd/pelvis .Mild-to-moderate acute sigmoid diverticulitis without abscess or extraluminal gas, similar in location to 09/14/2023 . If this  continues to fail to resolve despite appropriate therapy, further workup to exclude unusual predisposing factors such as an underlying tumor may be indicated. 2. Stable extrahepatic biliary dilatation with 1.0 cm common bile duct and distal tapering near the ampulla. 3. Right hepatic lobe cyst with faint septations; cystadenoma not fully excluded. 4. Postsurgical changes from left lower pole partial nephrectomy,  unchanged. 5. Colonic diverticulosis, greatest in the sigmoid colon. 6. 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 7. Mild uterine lobularity suggesting fibroids.  Significant Labs:   Significant Imaging Studies:   Antibiotic Therapy: Anti-infectives (From admission, onward)    Start     Dose/Rate Route Frequency Ordered Stop   11/08/23 1545  piperacillin -tazobactam (ZOSYN ) IVPB 3.375 g        3.375 g 100 mL/hr over 30 Minutes Intravenous  Once 11/08/23 1540         Procedures:   Consultants:

## 2023-11-08 NOTE — ED Notes (Signed)
 Patient made aware of need for a urine sample. She requests to wait a few moments and then try.

## 2023-11-08 NOTE — ED Notes (Signed)
 RN is aware of pt's BP readings

## 2023-11-08 NOTE — Assessment & Plan Note (Addendum)
 11/08/23 patient has had right upper quadrant abdominal pain for several months now.  It has NOT gotten any worse in severity.  It does happen when she moves around.  She notices it more when she is having a bowel movement.  It is especially worse when she is constipated.  Her right upper quadrant pain is not associated with any food.  She denies any problems eating greasy or fried food.  She has really no dyspepsia with eating.  She has a HIDA scan ordered to rule out biliary dyskinesia but I doubt it is going to show anything.  She has a chronically dilated common bile duct.  Her endoscopic ultrasound last year and her recent MRCP did not show any common bile duct stones.   have reviewed her CTs starting from December 05, 2022, August 29, 2023, September 14, 2023 and today's CT.  They all show the same area of inflammation and stranding near the sigmoid colon.  I highly doubt that she has had persistent acute diverticulitis for nearly 12 months now.  I really do not think that she has active diverticulitis of the sigmoid colon as she absolutely has no pain whatsoever in the left lower quadrant.  I do not think she needs any more IV antibiotics.  If her HIDA scan is negative tomorrow, I think a course of corticosteroids such as prednisone for a week to see if there is any inflammation in the right upper quadrant that can be calmed down.  Patient specifically denies a history of gonorrhea or chlamydia.  Making Fitz-Hugh Curtis syndrome very unlikely.  She does state that she has had trichomonas before but never has had gonorrhea or PID.  She will be n.p.o. after midnight for HIDA scan.  Place her on fluids.  Place her on scheduled Toradol .  Place her on scheduled Protonix  for GI prophylaxis.

## 2023-11-08 NOTE — Consult Note (Signed)
 Consult Note  DEEM MARMOL 10-26-1975  [id number].    Requesting MD: Rocky Massy, MD Chief Complaint/Reason for Consult: Persistent diverticulitis  HPI:  Patient is a 48 year old female with PMH HTN, HLD, T2DM, RCC s/p partial left nephrectomy 07/04/23, IDA, palpitations/PVCs, gastritis/duodenitis, anxiety, CBD dilation and pancreatic cyst who was seen today as an office consultation for evaluation of diverticulitis by Dr. Ann. She reported today that primary concern was RUQ pain that was constant and severe. She has had intermittent nausea. Denied fever/chills/vomiting. No change in symptoms related to PO intake. Last meal was this morning. Reports last BM was this morning and was normal for her. Denies hematochezia. Reports flatulence. Due to concern for possible acute cholecystitis she was referred to the ED for further workup. Found to have persistent diverticulitis on CT.   Patient was hospitalized 08/08/23-08/10/23 for acute diverticulitis with small abscess not amenable to IR drainage, treated with IV antibiotics and finished 7 day course total with cipro /flagyl . Repeat imaging 08/22/23 ordered by oncology showed persistent sigmoid diverticulitis with small foci of gas concerning for microperforation. Patient was readmitted to [facility] 7/24-7/28 without evidence of perforation of abscess. Patient also noted to have iron deficiency anemia during this admission.   Patient was seen again in ED on 8/7 for abdominal pain. Noted some BRBPR when wiping as well as in toilet. Pain primarily right sided. CT AP at that time showed improving uncomplicated sigmoid diverticulitis without perforation or abscess. CBD was dilated to 12mm and has been dilated in the past.   She has had EUS on 02/02/23 by Dr. Wilhelmenia which showed no evidence of choledocholithiasis or CBD stricture and no evidence of cholelithiasis or microlithiasis. Single pancreatic parenchymal foci noted at pancreatic head, main  pancreatic duct normal appearance, no intramural lesion, no malignant appearing lymph nodes.   She was last seen by GI on 8/26 and stated at that time that she had persistent RUQ pain which was different from her diverticulitis pain which was primarily in the lower abdomen. Pain initially began in November. Pain is sharp and worse when she lays on her right side. Worse with coughing, laughing or sudden movements as well as just when sitting still.   Patient had MRCP on 9/19 which redemonstrated known CBD bile duct dilation to 10mm without choledocholithiasis or obstructing mass.  Colonoscopy: Denies having in the past; Has this scheduled for 11/15/2023 Surgery: Robotic assisted partial nephrectomy, esophagogastroduodenoscopy, bunionectomy, Right ankle surgery for fracture Anticoagulation: Takes baby aspirin  daily Tobacco use: Former smoker. Quit in 07/2023 Alcohol consumption: Rarely Illicit Drug use: Denies Allergies: Ozempic  causes abdominal pain   ROS: Negative other than HPI  Family History  Problem Relation Age of Onset   Diabetes Mother    Hypertension Mother    Obesity Mother    Hypertension Father    Cancer Paternal Aunt        pancreatic   Cancer Maternal Grandmother        lung   Autism spectrum disorder Sister     Past Medical History:  Diagnosis Date   Anxiety 03/04/2018   Breast cancer screening 03/04/2018   Heart palpitations 03/04/2018   Monitor 9/21: normal sinus rhythm, sinus tachycardia, Avg HR 110; occ PVCs   Hypertension    Iron deficiency    Irregular heartbeat 03/04/2018   Mild mitral regurgitation    Obesity    Osteochondroma    PVC's (premature ventricular contractions)    Scalp mass 10/09/2012  Sinus tachycardia    Type 2 diabetes mellitus [facility])     Past Surgical History:  Procedure Laterality Date   ANKLE FRACTURE SURGERY Right    BIOPSY  02/02/2023   Procedure: BIOPSY;  Surgeon: Wilhelmenia Aloha Raddle., MD;  Location: [facility];   Service: Gastroenterology;;   BUNIONECTOMY Right    cyst removal from scalp     ESOPHAGOGASTRODUODENOSCOPY N/A 02/02/2023   Procedure: ESOPHAGOGASTRODUODENOSCOPY (EGD);  Surgeon: Wilhelmenia Aloha Raddle., MD;  Location: [facility];  Service: Gastroenterology;  Laterality: N/A;   EUS N/A 02/02/2023   Procedure: UPPER ENDOSCOPIC ULTRASOUND (EUS) RADIAL;  Surgeon: Wilhelmenia Aloha Raddle., MD;  Location: [facility];  Service: Gastroenterology;  Laterality: N/A;   left leg bone removal     ROBOTIC ASSITED PARTIAL NEPHRECTOMY Left 07/04/2023   Procedure: XI ROBOTIC ASSITED LEFT  PARTIAL NEPHRECTOMY;  Surgeon: Devere Lonni Righter, MD;  Location: [facility];  Service: Urology;  Laterality: Left;  180 MINUTES    Social History:  reports that she has quit smoking. Her smoking use included cigarettes. She has a 9 pack-year smoking history. She has never been exposed to tobacco smoke. She has never used smokeless tobacco. She reports current alcohol use. She reports that she does not use drugs.  Allergies:  Allergies  Allergen Reactions   Ozempic  (0.25 Or 0.5 Mg-Dose) [Semaglutide (0.25 Or 0.5mg -Dos)] Other (See Comments)    Abdominal pain    (Not in a hospital admission)   Blood pressure (!) 160/83, pulse 63, temperature (!) 97.4 F (36.3 C), temperature source Oral, resp. rate 19, height 5' 5 (1.651 m), weight 90.7 kg, SpO2 100%. Physical Exam:  General: Pleasant female who is laying in bed in NAD but is uncomfortable. HEENT: Head is normocephalic, atraumatic.  Sclera are noninjected. Conjunctiva anicteric. Heart: HR normal Lungs: Respiratory effort nonlabored Abd: Soft, ND. Tenderness to palpation of epigastric region and RUQ. +Murphy's sign. No rebound tenderness or guarding. +BS. Skin: Warm and dry Psych: A&Ox3 with an appropriate affect.   Results for orders placed or performed during the hospital encounter of 11/08/23 (from the past 48 hours)  CBC with Differential     Status:  Abnormal   Collection Time: 11/08/23 12:43 PM  Result Value Ref Range   WBC 4.0 4.0 - 10.5 K/uL   RBC 4.56 3.87 - 5.11 MIL/uL   Hemoglobin 10.5 (L) 12.0 - 15.0 g/dL   HCT 64.3 (L) 63.9 - 53.9 %   MCV 78.1 (L) 80.0 - 100.0 fL   MCH 23.0 (L) 26.0 - 34.0 pg   MCHC 29.5 (L) 30.0 - 36.0 g/dL   RDW 84.2 (H) 88.4 - 84.4 %   Platelets 420 (H) 150 - 400 K/uL   nRBC 0.0 0.0 - 0.2 %   Neutrophils Relative % 58 %   Neutro Abs 2.3 1.7 - 7.7 K/uL   Lymphocytes Relative 26 %   Lymphs Abs 1.0 0.7 - 4.0 K/uL   Monocytes Relative 13 %   Monocytes Absolute 0.5 0.1 - 1.0 K/uL   Eosinophils Relative 3 %   Eosinophils Absolute 0.1 0.0 - 0.5 K/uL   Basophils Relative 0 %   Basophils Absolute 0.0 0.0 - 0.1 K/uL   Immature Granulocytes 0 %   Abs Immature Granulocytes 0.01 0.00 - 0.07 K/uL    Comment: Performed at [facility], [address] [place], [place] [address]  Comprehensive metabolic panel     Status: Abnormal   Collection Time: 11/08/23 12:43 PM  Result Value Ref  Range   Sodium 138 135 - 145 mmol/L   Potassium 3.6 3.5 - 5.1 mmol/L   Chloride 104 98 - 111 mmol/L   CO2 19 (L) 22 - 32 mmol/L   Glucose, Bld 109 (H) 70 - 99 mg/dL    Comment: Glucose reference range applies only to samples taken after fasting for at least 8 hours.   BUN 9 6 - 20 mg/dL   Creatinine, Ser 9.12 0.44 - 1.00 mg/dL   Calcium  9.3 8.9 - 10.3 mg/dL   Total Protein 7.7 6.5 - 8.1 g/dL   Albumin 4.1 3.5 - 5.0 g/dL   AST 28 15 - 41 U/L   ALT 22 0 - 44 U/L   Alkaline Phosphatase 102 38 - 126 U/L   Total Bilirubin 0.4 0.0 - 1.2 mg/dL   GFR, Estimated >39 >39 mL/min    Comment: (NOTE) Calculated using the CKD-EPI Creatinine Equation (2021)    Anion gap 14 5 - 15    Comment: Performed at [facility], [address] [place], [place] [address]  hCG, serum, qualitative     Status: None   Collection Time: 11/08/23 12:43 PM  Result Value Ref Range   Preg, Serum NEGATIVE NEGATIVE     Comment:        THE SENSITIVITY OF THIS METHODOLOGY IS >10 mIU/mL. Performed at [facility], [address], [place], [place] [address]   Lipase, blood     Status: Abnormal   Collection Time: 11/08/23 12:43 PM  Result Value Ref Range   Lipase 56 (H) 11 - 51 U/L    Comment: Performed at [facility], [address], [place], [place] [address]  I-Stat CG4 Lactic Acid     Status: Abnormal   Collection Time: 11/08/23 12:45 PM  Result Value Ref Range   Lactic Acid, Venous 2.2 (HH) 0.5 - 1.9 mmol/L   Comment NOTIFIED PHYSICIAN   Urinalysis, w/ Reflex to Culture (Infection Suspected) -Urine, Clean Catch     Status: Abnormal   Collection Time: 11/08/23  1:34 PM  Result Value Ref Range   Specimen Source URINE, CLEAN CATCH    Color, Urine YELLOW YELLOW   APPearance CLEAR CLEAR   Specific Gravity, Urine 1.015 1.005 - 1.030   pH 5.0 5.0 - 8.0   Glucose, UA NEGATIVE NEGATIVE mg/dL   Hgb urine dipstick NEGATIVE NEGATIVE   Bilirubin Urine NEGATIVE NEGATIVE   Ketones, ur NEGATIVE NEGATIVE mg/dL   Protein, ur 899 (A) NEGATIVE mg/dL   Nitrite NEGATIVE NEGATIVE   Leukocytes,Ua NEGATIVE NEGATIVE   RBC / HPF 0-5 0 - 5 RBC/hpf   WBC, UA 0-5 0 - 5 WBC/hpf    Comment:        Reflex urine culture not performed if WBC <=10, OR if Squamous epithelial cells >5. If Squamous epithelial cells >5 suggest recollection.    Bacteria, UA RARE (A) NONE SEEN   Squamous Epithelial / HPF 0-5 0 - 5 /HPF   Mucus PRESENT     Comment: Performed at [facility], [address] [place], [place] [address]  I-Stat CG4 Lactic Acid     Status: None   Collection Time: 11/08/23  3:42 PM  Result Value Ref Range   Lactic Acid, Venous 1.5 0.5 - 1.9 mmol/L   CT ABDOMEN PELVIS W CONTRAST Result Date: 11/08/2023 EXAM: CT ABDOMEN AND PELVIS WITH CONTRAST 11/08/2023 02:21:36 PM TECHNIQUE: CT of the abdomen and pelvis was performed with  the administration of 100 mL  iohexol  (OMNIPAQUE ) 300 MG/ML solution. Multiplanar reformatted images are provided for review. Automated exposure control, iterative reconstruction, and/or weight-based adjustment of the mA/kV was utilized to reduce the radiation dose to as low as reasonably achievable. COMPARISON: MRI 02/26/2023 and CT scan 09/14/2023. CLINICAL HISTORY: Intra-abdominal abscess. Abdominal pain for months, referred from PCP to ED. FINDINGS: LOWER CHEST: No acute abnormality. LIVER: Faint septations in a 1.9 x 1.4 cm right hepatic lobe cyst on image 10 series 2 as reported on recent prior MRI. No nodularity. Technically speaking, cyst adenoma is not completely excluded. GALLBLADDER AND BILE DUCTS: Continued extrahepatic biliary dilatation with common bile duct 1.0 cm diameter with distal tapering in the vicinity of the ampulla. Gallbladder unremarkable. SPLEEN: No acute abnormality. PANCREAS: No acute abnormality. ADRENAL GLANDS: No acute abnormality. KIDNEYS, URETERS AND BLADDER: Unchanged appearance of left kidney lower pole partial nephrectomy. No stones in the kidneys or ureters. No hydronephrosis. No perinephric or periureteral stranding. Urinary bladder is unremarkable. GI AND BOWEL: Stomach demonstrates no acute abnormality. Scattered colonic diverticulosis, concentrated in the sigmoid colon. Focal active diverticulitis in the sigmoid colon is observed on images 57 through 60 of series 2, with associated stranding in the adjacent sigmoid mesentery but no appreciable abscess or extraluminal gas. The adjacent appendix appears unremarkable. Overall appearance compatible with mild-to-moderate acute sigmoid colon diverticulitis; this corresponds in position to site of prior inflammation along the sigmoid colon shown on 09/14/2023. There is no bowel obstruction. PERITONEUM AND RETROPERITONEUM: No ascites. No free air. VASCULATURE: Aorta is normal in caliber. LYMPH NODES: No lymphadenopathy. REPRODUCTIVE ORGANS: Mild lobularity of  the uterus suggesting uterine fibroids. BONES AND SOFT TISSUES: Sizeable lateral and smaller medial osteochondromas of the left iliac bone. Suspected small osteochondroma of the right posterior proximal femoral metaphysis. Mild marginal lobularity in the iliac crests. Appearance favors multiple hereditary exostosis. No focal soft tissue abnormality. IMPRESSION: 1. Mild-to-moderate acute sigmoid diverticulitis without abscess or extraluminal gas, similar in location to 09/14/2023 . If this continues to fail to resolve despite appropriate therapy, further workup to exclude unusual predisposing factors such as an underlying tumor may be indicated. 2. Stable extrahepatic biliary dilatation with 1.0 cm common bile duct and distal tapering near the ampulla. 3. Right hepatic lobe cyst with faint septations; cystadenoma not fully excluded. 4. Postsurgical changes from left lower pole partial nephrectomy, unchanged. 5. Colonic diverticulosis, greatest in the sigmoid colon. 6. 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 7. Mild uterine lobularity suggesting fibroids. Electronically signed by: Ryan Salvage MD 11/08/2023 02:59 PM EDT RP Workstation: [id number]   US  Abdomen Limited RUQ (LIVER/GB) Result Date: 11/08/2023 CLINICAL DATA:  Right upper quadrant pain several months. EXAM: ULTRASOUND ABDOMEN LIMITED RIGHT UPPER QUADRANT COMPARISON:  CT abdomen 09/14/2023 and MRI abdomen 10/27/2023. FINDINGS: Gallbladder: No evidence of gallstones or wall thickening. No adjacent free fluid. Negative sonographic Murphy sign. Common bile duct: Diameter: Common bile duct measures 10.8 mm which is unchanged from recent CT and MRI. Liver: Mild increased parenchymal echogenicity compatible with a degree of steatosis. 2 cm cyst over the right lobe. Portal vein is patent on color Doppler imaging with normal direction of blood flow towards the liver. Other: No free fluid. IMPRESSION: 1.  No acute findings. 2. Stable dilatation of the common bile duct measuring 10.8 mm with no evidence of ductal stone or mass on recent MRI. 3. Mild hepatic steatosis.  2 cm liver cyst. Electronically Signed   By: Toribio Agreste  M.D.   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 - Recommend HIDA to assess biliary function although do not currently see signs of cholecystitis on imaging and no gallstones noted on US . - Recommend engaging GI as well - followed by [facility]. - In setting of persistent diverticulitis may need to consider surgical intervention if not improving but does not require emergent surgical intervention this evening. - We will continue to follow  Admit to medical service and general surgery will follow   FEN: NPO; IVF per primary team VTE: ok to have SQH or LMWH from surgical standpoint  ID: [email or web address]  10/1>>  - per [facility] -  HTN HLD T2DM RCC s/p partial L nephrectomy IDA Palpitations/PVCs Gastritis/duodenitis CBD dilation and pancreatic cyst Anxiety  I reviewed ED provider notes, last 24 h vitals and pain scores, last 48 h intake and output, last 24 h labs and  trends, and last 24 h imaging results.  This care required high  level of medical decision making.    Marjorie Favre, [facility] 11/08/2023, 4:11 PM Please see Amion for pager number during day hours 7:00am-4:30pm

## 2023-11-08 NOTE — Assessment & Plan Note (Signed)
 11/08/23 chronically dilated  common bile duct at around 10 mm.  There is been no stones or sludge noted on MRCP.  I do not think ERCP or GI consult will be of any benefit.

## 2023-11-09 ENCOUNTER — Telehealth: Payer: Self-pay

## 2023-11-09 ENCOUNTER — Observation Stay (HOSPITAL_COMMUNITY)

## 2023-11-09 DIAGNOSIS — K5720 Diverticulitis of large intestine with perforation and abscess without bleeding: Secondary | ICD-10-CM | POA: Diagnosis not present

## 2023-11-09 DIAGNOSIS — Z0389 Encounter for observation for other suspected diseases and conditions ruled out: Secondary | ICD-10-CM | POA: Diagnosis not present

## 2023-11-09 DIAGNOSIS — R1011 Right upper quadrant pain: Secondary | ICD-10-CM | POA: Diagnosis not present

## 2023-11-09 LAB — COMPREHENSIVE METABOLIC PANEL WITH GFR
ALT: 17 U/L (ref 0–44)
AST: 21 U/L (ref 15–41)
Albumin: 3.6 g/dL (ref 3.5–5.0)
Alkaline Phosphatase: 90 U/L (ref 38–126)
Anion gap: 12 (ref 5–15)
BUN: 12 mg/dL (ref 6–20)
CO2: 23 mmol/L (ref 22–32)
Calcium: 8.9 mg/dL (ref 8.9–10.3)
Chloride: 105 mmol/L (ref 98–111)
Creatinine, Ser: 0.86 mg/dL (ref 0.44–1.00)
GFR, Estimated: >60 mL/min (ref >60)
Glucose, Bld: 109 mg/dL — ABNORMAL HIGH (ref 70–99)
Potassium: 3.6 mmol/L (ref 3.5–5.1)
Sodium: 139 mmol/L (ref 135–145)
Total Bilirubin: 0.2 mg/dL (ref 0.0–1.2)
Total Protein: 6.5 g/dL (ref 6.5–8.1)

## 2023-11-09 LAB — CBC WITH DIFFERENTIAL/PLATELET
Abs Immature Granulocytes: 0.01 K/uL (ref 0.00–0.07)
Basophils Absolute: 0 K/uL (ref 0.0–0.1)
Basophils Relative: 0 %
Eosinophils Absolute: 0.1 K/uL (ref 0.0–0.5)
Eosinophils Relative: 2 %
HCT: 30.3 % — ABNORMAL LOW (ref 36.0–46.0)
Hemoglobin: 9 g/dL — ABNORMAL LOW (ref 12.0–15.0)
Immature Granulocytes: 0 %
Lymphocytes Relative: 19 %
Lymphs Abs: 1.1 K/uL (ref 0.7–4.0)
MCH: 23.4 pg — ABNORMAL LOW (ref 26.0–34.0)
MCHC: 29.7 g/dL — ABNORMAL LOW (ref 30.0–36.0)
MCV: 78.7 fL — ABNORMAL LOW (ref 80.0–100.0)
Monocytes Absolute: 0.7 K/uL (ref 0.1–1.0)
Monocytes Relative: 11 %
Neutro Abs: 3.9 K/uL (ref 1.7–7.7)
Neutrophils Relative %: 68 %
Platelets: 341 K/uL (ref 150–400)
RBC: 3.85 MIL/uL — ABNORMAL LOW (ref 3.87–5.11)
RDW: 15.8 % — ABNORMAL HIGH (ref 11.5–15.5)
WBC: 5.8 K/uL (ref 4.0–10.5)
nRBC: 0 % (ref 0.0–0.2)

## 2023-11-09 MED ORDER — TECHNETIUM TC 99M MEBROFENIN IV KIT
5.3500 | PACK | Freq: Once | INTRAVENOUS | Status: AC
Start: 2023-11-09 — End: 2023-11-09
  Administered 2023-11-09: 5.35 via INTRAVENOUS

## 2023-11-09 NOTE — Progress Notes (Signed)
 Assessment unchanged. Pt verbalized understanding of dc instructions through teach back. Encouraged to call PCP and/or GI MD if symptoms return. Discharged via foot per pt request to front entrance accompanied by son.

## 2023-11-09 NOTE — Plan of Care (Signed)
  Problem: Clinical Measurements: Goal: Ability to maintain clinical measurements within normal limits will improve 11/09/2023 1741 by Raynaldo Vina GAILS, RN Outcome: Progressing   Problem: Education: Goal: Knowledge of General Education information will improve Description: Including pain rating scale, medication(s)/side effects and non-pharmacologic comfort measures Outcome: Adequate for Discharge   Problem: Health Behavior/Discharge Planning: Goal: Ability to manage health-related needs will improve 11/09/2023 1753 by Raynaldo Vina GAILS, RN Outcome: Adequate for Discharge 11/09/2023 1741 by Raynaldo Vina GAILS, RN Outcome: Progressing   Problem: Clinical Measurements: Goal: Ability to maintain clinical measurements within normal limits will improve 11/09/2023 1753 by Raynaldo Vina GAILS, RN Outcome: Adequate for Discharge 11/09/2023 1741 by Raynaldo Vina GAILS, RN Outcome: Progressing Goal: Will remain free from infection Outcome: Adequate for Discharge Goal: Diagnostic test results will improve Outcome: Adequate for Discharge Goal: Respiratory complications will improve Outcome: Adequate for Discharge Goal: Cardiovascular complication will be avoided Outcome: Adequate for Discharge   Problem: Activity: Goal: Risk for activity intolerance will decrease Outcome: Adequate for Discharge   Problem: Nutrition: Goal: Adequate nutrition will be maintained Outcome: Adequate for Discharge   Problem: Coping: Goal: Level of anxiety will decrease 11/09/2023 1753 by Raynaldo Vina GAILS, RN Outcome: Adequate for Discharge 11/09/2023 1741 by Raynaldo Vina GAILS, RN Outcome: Progressing   Problem: Elimination: Goal: Will not experience complications related to bowel motility Outcome: Adequate for Discharge Goal: Will not experience complications related to urinary retention Outcome: Adequate for Discharge   Problem: Pain Managment: Goal: General experience of comfort will improve and/or  be controlled Outcome: Adequate for Discharge   Problem: Safety: Goal: Ability to remain free from injury will improve Outcome: Adequate for Discharge   Problem: Skin Integrity: Goal: Risk for impaired skin integrity will decrease Outcome: Adequate for Discharge

## 2023-11-09 NOTE — Discharge Summary (Addendum)
 Physician Discharge Summary  Kaylee Maxwell FMW:[id number] DOB: 08/14/1975 DOA: 11/08/2023  PCP: Paseda, [name], FNP  Admit date: 11/08/2023 Discharge date: 11/09/2023  Admitted From: Home Disposition:  Home  Recommendations for Outpatient Follow-up:  Follow up with PCP in 1-2 weeks Follow up with GI as scheduled  Home Health:None  Equipment/Devices:None  Discharge Condition:Stable  CODE STATUS:Full  Diet recommendation: Regular diet as tolerated    Brief/Interim Summary: 48 year old African-American female history of obesity, type 2 diabetes, hypertension, iron deficiency anemia, chronically dilated common bile duct, history of sigmoid diverticulitis with abscess, chronic right upper quadrant pain who presents to the ER from the general surgery office due to right upper quadrant pain. She was seen by general surgery today for a follow-up appointment and sent to the ER due to concern about her gallbladder given pain/symptoms.  Patient admitted as above for RUQ pain -thankfully the US  and HIDA scan are unremarkable and symptoms have resolved. Will have patient follow up with PCP and GI as scheduled. Plan for outpatient colonoscopy 10/8 - defer to GI for ongoing plans moving forward.  Discharge Diagnoses:  Principal Problem:   RUQ abdominal pain Active Problems:   Obesity, Class I, BMI 30-34.9   Essential hypertension   Non-insulin  dependent type 2 diabetes mellitus (HCC)   Iron deficiency anemia   Dilated cbd, acquired   RUQ abdominal pain Resolved - unclear etiology - US  and HIDA negative -plan for outpatient colonoscopy per GI    Dilated cbd, acquired, chronic Chronically dilated  common bile duct at around 10 mm. HIDA scan negative - follow up outpatient with GI as scheduled.     Iron deficiency anemia Continue home regimen  Non-insulin  dependent type 2 diabetes mellitus (HCC), well controlled - A1c 5.7 Essential hypertension continue toprol -xl  Obesity, Class I,  BMI 30-34.9  Discharge Instructions  Discharge Instructions     Call MD for:  difficulty breathing, headache or visual disturbances   Complete by: As directed    Call MD for:  extreme fatigue   Complete by: As directed    Call MD for:  hives   Complete by: As directed    Call MD for:  persistant dizziness or light-headedness   Complete by: As directed    Call MD for:  persistant nausea and vomiting   Complete by: As directed    Call MD for:  severe uncontrolled pain   Complete by: As directed    Call MD for:  temperature >100.4   Complete by: As directed    Diet - low sodium heart healthy   Complete by: As directed    Increase activity slowly   Complete by: As directed       Allergies as of 11/09/2023       Reactions   Ozempic  (0.25 Or 0.5 Mg-dose) [semaglutide (0.25 Or 0.5mg -dos)] Other (See Comments)   Abdominal pain        Medication List     TAKE these medications    acetaminophen  325 MG tablet Commonly known as: TYLENOL  Take 2 tablets (650 mg total) by mouth every 6 (six) hours as needed for mild pain (pain score 1-3), fever or moderate pain (pain score 4-6) (or Fever >/= 101).   aspirin  EC 81 MG tablet Take 81 mg by mouth in the morning. Swallow whole.   atorvastatin  10 MG tablet Commonly known as: LIPITOR Take 1 tablet (10 mg total) by mouth daily.   ferrous sulfate  325 (65 FE) MG EC tablet Take 1  tablet (325 mg total) by mouth daily. What changed: when to take this   freestyle lancets Use as directed   FREESTYLE LITE test strip Generic drug: glucose blood Use as instructed   HYDROcodone -acetaminophen  5-325 MG tablet Commonly known as: NORCO/VICODIN Take 1 tablet by mouth every 6 (six) hours as needed for severe pain (pain score 7-10).   metFORMIN  500 MG 24 hr tablet Commonly known as: GLUCOPHAGE -XR Take 500 mg with breakfast daily by mouth  for one week , then increase to 500 mg by mouth twice daily with meal What changed:  how much to  take how to take this when to take this additional instructions   metoprolol  succinate 50 MG 24 hr tablet Commonly known as: TOPROL -XL Take 1 tablet (50 mg total) by mouth daily. Take with or immediately following a meal. Please schedule follow up appt for more refills   NON FORMULARY Take by mouth See admin instructions. Seaweed extract - Place 1 dropperful in the mouth, then swallow once a day   NON FORMULARY Take by mouth See admin instructions. Soursop fruit extract - Place 1 dropperful in the mouth, then swallow once a day   OREGANO PO Take by mouth See admin instructions. Oregano oil - Place 1 dropperful in the mouth, then swallow once a day   Oscimin  0.125 MG Subl Generic drug: Hyoscyamine  Sulfate SL Place 1 tablet (0.125 mg total) under the tongue every 6 (six) hours as needed. What changed: reasons to take this   pantoprazole  40 MG tablet Commonly known as: PROTONIX  Take 1 tablet (40 mg total) by mouth 2 (two) times daily. What changed: when to take this   traZODone  50 MG tablet Commonly known as: DESYREL  Take 1 tablet (50 mg total) by mouth at bedtime as needed for sleep        Allergies  Allergen Reactions   Ozempic  (0.25 Or 0.5 Mg-Dose) [Semaglutide (0.25 Or 0.5mg -Dos)] Other (See Comments)    Abdominal pain    Consultations: None   Procedures/Studies: NM Hepatobiliary Liver Func Result Date: 11/09/2023 EXAM: NM HEPATOBILLARY SCAN 11/09/2023 12:48:54 PM TECHNIQUE: RADIOPHARMACEUTICAL: 5.35 mCi Tc-62m mebrofenin Dynamic images of the abdomen and pelvis were obtained in the anterior projection for 1 hour after intravenous administration of radiopharmaceutical. Additional delayed images are obtained up to 4 hours. COMPARISON: CT 11/08/2023 and ultrasound 11/08/2023. CLINICAL HISTORY: Cholelithiasis. 5.35 mCi Tc17m Choletec IV Lt Hand @ 1113 MM; Bili - 0.2 on 11/09/2023; Hepatobiliary scan for cholelithiasis; Is NPO; no opiates; no morphine allergy; no  gallbladder sx; EOV. FINDINGS: Homogenous uptake within the liver. Normal clearance of the blood pool. Appropriate excretion into the biliary system. Activity is visualized in the small bowel. Activity is visualized in the gallbladder. IMPRESSION: 1. Normal hepatobiliary scintigraphy with no evidence of acute cholecystitis. Electronically signed by: Norleen Boxer MD 11/09/2023 12:53 PM EDT RP Workstation: [id number]   CT ABDOMEN PELVIS W CONTRAST Result Date: 11/08/2023 EXAM: CT ABDOMEN AND PELVIS WITH CONTRAST 11/08/2023 02:21:36 PM TECHNIQUE: CT of the abdomen and pelvis was performed with the administration of 100 mL iohexol  (OMNIPAQUE ) 300 MG/ML solution. Multiplanar reformatted images are provided for review. Automated exposure control, iterative reconstruction, and/or weight-based adjustment of the mA/kV was utilized to reduce the radiation dose to as low as reasonably achievable. COMPARISON: MRI 02/26/2023 and CT scan 09/14/2023. CLINICAL HISTORY: Intra-abdominal abscess. Abdominal pain for months, referred from PCP to ED. FINDINGS: LOWER CHEST: No acute abnormality. LIVER: Faint septations in a 1.9 x 1.4 cm right hepatic lobe cyst  on image 10 series 2 as reported on recent prior MRI. No nodularity. Technically speaking, cyst adenoma is not completely excluded. GALLBLADDER AND BILE DUCTS: Continued extrahepatic biliary dilatation with common bile duct 1.0 cm diameter with distal tapering in the vicinity of the ampulla. Gallbladder unremarkable. SPLEEN: No acute abnormality. PANCREAS: No acute abnormality. ADRENAL GLANDS: No acute abnormality. KIDNEYS, URETERS AND BLADDER: Unchanged appearance of left kidney lower pole partial nephrectomy. No stones in the kidneys or ureters. No hydronephrosis. No perinephric or periureteral stranding. Urinary bladder is unremarkable. GI AND BOWEL: Stomach demonstrates no acute abnormality. Scattered colonic diverticulosis, concentrated in the sigmoid colon. Focal active  diverticulitis in the sigmoid colon is observed on images 57 through 60 of series 2, with associated stranding in the adjacent sigmoid mesentery but no appreciable abscess or extraluminal gas. The adjacent appendix appears unremarkable. Overall appearance compatible with mild-to-moderate acute sigmoid colon diverticulitis; this corresponds in position to site of prior inflammation along the sigmoid colon shown on 09/14/2023. There is no bowel obstruction. PERITONEUM AND RETROPERITONEUM: No ascites. No free air. VASCULATURE: Aorta is normal in caliber. LYMPH NODES: No lymphadenopathy. REPRODUCTIVE ORGANS: Mild lobularity of the uterus suggesting uterine fibroids. BONES AND SOFT TISSUES: Sizeable lateral and smaller medial osteochondromas of the left iliac bone. Suspected small osteochondroma of the right posterior proximal femoral metaphysis. Mild marginal lobularity in the iliac crests. Appearance favors multiple hereditary exostosis. No focal soft tissue abnormality. IMPRESSION: 1. Mild-to-moderate acute sigmoid diverticulitis without abscess or extraluminal gas, similar in location to 09/14/2023 . If this continues to fail to resolve despite appropriate therapy, further workup to exclude unusual predisposing factors such as an underlying tumor may be indicated. 2. Stable extrahepatic biliary dilatation with 1.0 cm common bile duct and distal tapering near the ampulla. 3. Right hepatic lobe cyst with faint septations; cystadenoma not fully excluded. 4. Postsurgical changes from left lower pole partial nephrectomy, unchanged. 5. Colonic diverticulosis, greatest in the sigmoid colon. 6. 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 7. Mild uterine lobularity suggesting fibroids. Electronically signed by: Ryan Salvage MD 11/08/2023 02:59 PM EDT RP Workstation: [id number]   US  Abdomen Limited RUQ (LIVER/GB) Result Date: 11/08/2023 CLINICAL DATA:  Right upper  quadrant pain several months. EXAM: ULTRASOUND ABDOMEN LIMITED RIGHT UPPER QUADRANT COMPARISON:  CT abdomen 09/14/2023 and MRI abdomen 10/27/2023. FINDINGS: Gallbladder: No evidence of gallstones or wall thickening. No adjacent free fluid. Negative sonographic Murphy sign. Common bile duct: Diameter: Common bile duct measures 10.8 mm which is unchanged from recent CT and MRI. Liver: Mild increased parenchymal echogenicity compatible with a degree of steatosis. 2 cm cyst over the right lobe. Portal vein is patent on color Doppler imaging with normal direction of blood flow towards the liver. Other: No free fluid. IMPRESSION: 1. No acute findings. 2. Stable dilatation of the common bile duct measuring 10.8 mm with no evidence of ductal stone or mass on recent MRI. 3. Mild hepatic steatosis.  2 cm liver cyst. Electronically Signed   By: Toribio Agreste M.D.   On: 11/08/2023 12:52   MR ABDOMEN MRCP W WO CONTAST Result Date: 10/27/2023 CLINICAL DATA:  RUQ abdominal pain, pancreatic cyst, dilated CBD. EXAM: MRI ABDOMEN WITHOUT AND WITH CONTRAST (INCLUDING MRCP) TECHNIQUE: Multiplanar multisequence MR imaging of the abdomen was performed both before and after the administration of intravenous contrast. Heavily T2-weighted images of the biliary and pancreatic ducts were obtained, and three-dimensional MRCP images were rendered by post processing. CONTRAST:  9mL GADAVIST  GADOBUTROL  1  MMOL/ML IV SOLN COMPARISON:  CT scan abdomen and pelvis from 09/14/2023 and MRI abdomen from 12/05/2022. FINDINGS: Lower chest: Unremarkable MR appearance to the lung bases. No pleural effusion. No pericardial effusion. Normal heart size. Hepatobiliary: The liver is moderately enlarged in size. Noncirrhotic configuration. There are several scattered cysts with largest in the right hepatic lobe, segment 8 measuring up to 1.5 x 2.0 cm. The cyst contains multiple thin internal septations. No significant interval change since the prior study. No  intrahepatic bile duct dilation. Redemonstration of mildly dilated extrahepatic bile duct measuring up to 10 mm in the proximal portion 7-8 mm in the midportion and gradually tapers near the ampulla of Vater. No significant interval change in the size compared to the prior exam, when remeasured in similar fashion. No choledocholithiasis. Unremarkable gallbladder. Pancreas: Normal T1 hyperintense signal of the pancreas. 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 No other focal pancreatic lesion seen. No peripancreatic fat stranding. Main pancreatic duct is not dilated. Spleen:  Within normal limits in size and appearance. No focal mass. Adrenals/Urinary Tract: Unremarkable adrenal glands. Since the prior study, there is interval resection of previously seen partially exophytic lesion arising from the left kidney lower pole. There are expected postsurgical changes in the left lower pole. There are few heterogeneous T1 hyperintense nonenhancing areas at the resection site, which may represent hemorrhagic/proteinaceous contents. No discrete enhancing residual or recurrent tumor seen. There are several sinus cysts in bilateral kidneys. No other suspicious renal lesion. No hydroureteronephrosis. 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 Stomach/Bowel: Visualized portions within the abdomen are unremarkable. No disproportionate dilation of bowel loops. Unremarkable appendix. Vascular/Lymphatic: No pathologically enlarged lymph nodes identified. No abdominal aortic aneurysm demonstrated. No ascites. Other:  None. Musculoskeletal: No suspicious bone lesions identified. IMPRESSION: 1. Since the prior study, there is interval resection of previously seen left kidney lower pole lesion. There are expected postsurgical  changes in the resection bed. No discrete enhancing residual or recurrent tumor seen. 2. Redemonstration of mildly dilated extrahepatic bile duct measuring up to 10 mm in the proximal portion and gradually tapers near the Ampulla of Vater. No choledocholithiasis or obstructing mass. No intrahepatic bile duct dilation. 3. 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 Follow-up examination with MRI abdomen with MRCP protocol in 1 year is recommended. 4. Multiple other nonacute observations, as described above. Electronically Signed   By: Ree Molt M.D.   On: 10/27/2023 17:22   MR 3D Recon At Scanner Result Date: 10/27/2023 CLINICAL DATA:  RUQ abdominal pain, pancreatic cyst, dilated CBD. EXAM: MRI ABDOMEN WITHOUT AND WITH CONTRAST (INCLUDING MRCP) TECHNIQUE: Multiplanar multisequence MR imaging of the abdomen was performed both before and after the administration of intravenous contrast. Heavily T2-weighted images of the biliary and pancreatic ducts were obtained, and three-dimensional MRCP images were rendered by post processing. CONTRAST:  9mL GADAVIST  GADOBUTROL  1 MMOL/ML IV SOLN COMPARISON:  CT scan abdomen and pelvis from 09/14/2023 and MRI abdomen from 12/05/2022. FINDINGS: Lower chest: Unremarkable MR appearance to the lung bases. No pleural effusion. No pericardial effusion. Normal heart size. Hepatobiliary: The liver is moderately enlarged in size. Noncirrhotic configuration. There are several scattered cysts with largest in the right hepatic lobe, segment 8 measuring up to  1.5 x 2.0 cm. The cyst contains multiple thin internal septations. No significant interval change since the prior study. No intrahepatic bile duct dilation. Redemonstration of mildly dilated extrahepatic bile duct measuring up to 10 mm in the proximal portion 7-8 mm in the midportion and gradually tapers near the ampulla of Vater.  No significant interval change in the size compared to the prior exam, when remeasured in similar fashion. No choledocholithiasis. Unremarkable gallbladder. Pancreas: Normal T1 hyperintense signal of the pancreas. 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 No other focal pancreatic lesion seen. No peripancreatic fat stranding. Main pancreatic duct is not dilated. Spleen:  Within normal limits in size and appearance. No focal mass. Adrenals/Urinary Tract: Unremarkable adrenal glands. Since the prior study, there is interval resection of previously seen partially exophytic lesion arising from the left kidney lower pole. There are expected postsurgical changes in the left lower pole. There are few heterogeneous T1 hyperintense nonenhancing areas at the resection site, which may represent hemorrhagic/proteinaceous contents. No discrete enhancing residual or recurrent tumor seen. There are several sinus cysts in bilateral kidneys. No other suspicious renal lesion. No hydroureteronephrosis. 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 Stomach/Bowel: Visualized portions within the abdomen are unremarkable. No disproportionate dilation of bowel loops. Unremarkable appendix. Vascular/Lymphatic: No pathologically enlarged lymph nodes identified. No abdominal aortic aneurysm demonstrated. No ascites. Other:  None. Musculoskeletal: No suspicious bone lesions identified. IMPRESSION: 1. Since the prior study, there is interval resection of previously seen left kidney lower pole lesion. There are expected postsurgical changes in the resection bed. No discrete enhancing residual or recurrent tumor seen. 2. Redemonstration of mildly dilated extrahepatic bile duct measuring up to 10 mm in the proximal portion and gradually  tapers near the Ampulla of Vater. No choledocholithiasis or obstructing mass. No intrahepatic bile duct dilation. 3. 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 Follow-up examination with MRI abdomen with MRCP protocol in 1 year is recommended. 4. Multiple other nonacute observations, as described above. Electronically Signed   By: Ree Molt M.D.   On: 10/27/2023 17:22     Subjective: No acute issues/events overnight   Discharge Exam: Vitals:   11/09/23 0936 11/09/23 1349  BP: (!) 145/86 (!) 175/75  Pulse: 62 64  Resp: 18 18  Temp: 98.6 F (37 C) 98.2 F (36.8 C)  SpO2: 100% 100%   Vitals:   11/09/23 0156 11/09/23 0638 11/09/23 0936 11/09/23 1349  BP: (!) 152/88 (!) 141/91 (!) 145/86 (!) 175/75  Pulse: 77 72 62 64  Resp: 16 16 18 18   Temp: 98.4 F (36.9 C) 98.5 F (36.9 C) 98.6 F (37 C) 98.2 F (36.8 C)  TempSrc: Oral Oral Oral Oral  SpO2: 100% 100% 100% 100%  Weight:      Height:        General: Pt is alert, awake, not in acute distress Cardiovascular: RRR, S1/S2 +, no rubs, no gallops Respiratory: CTA bilaterally, no wheezing, no rhonchi Abdominal: Soft, NT, ND, bowel sounds + Extremities: no edema, no cyanosis    The results of significant diagnostics from this hospitalization (including imaging, microbiology, ancillary and laboratory) are listed below for reference.     Microbiology: No results found for this or any previous visit (  from the past 240 hours).   Labs: BNP (last 3 results) No results for input(s): BNP in the last 8760 hours. Basic Metabolic Panel: Recent Labs  Lab 11/08/23 1243 11/09/23 0431  NA 138 139  K 3.6 3.6  CL 104 105  CO2 19* 23  GLUCOSE 109* 109*  BUN 9 12  CREATININE 0.87 0.86  CALCIUM  9.3 8.9   Liver Function Tests: Recent Labs  Lab 11/08/23 1243 11/09/23 0431  AST 28 21  ALT 22 17  ALKPHOS 102 90  BILITOT  0.4 0.2  PROT 7.7 6.5  ALBUMIN 4.1 3.6   Recent Labs  Lab 11/08/23 1243  LIPASE 56*   No results for input(s): AMMONIA in the last 168 hours. CBC: Recent Labs  Lab 11/08/23 1243 11/09/23 0431  WBC 4.0 5.8  NEUTROABS 2.3 3.9  HGB 10.5* 9.0*  HCT 35.6* 30.3*  MCV 78.1* 78.7*  PLT 420* 341   Cardiac Enzymes: No results for input(s): CKTOTAL, CKMB, CKMBINDEX, TROPONINI in the last 168 hours. BNP: Invalid input(s): POCBNP CBG: No results for input(s): GLUCAP in the last 168 hours. D-Dimer No results for input(s): DDIMER in the last 72 hours. Hgb A1c No results for input(s): HGBA1C in the last 72 hours. Lipid Profile No results for input(s): CHOL, HDL, LDLCALC, TRIG, CHOLHDL, LDLDIRECT in the last 72 hours. Thyroid function studies No results for input(s): TSH, T4TOTAL, T3FREE, THYROIDAB in the last 72 hours.  Invalid input(s): FREET3 Anemia work up No results for input(s): VITAMINB12, FOLATE, FERRITIN, TIBC, IRON, RETICCTPCT in the last 72 hours. Urinalysis    Component Value Date/Time   COLORURINE YELLOW 11/08/2023 1334   APPEARANCEUR CLEAR 11/08/2023 1334   LABSPEC 1.015 11/08/2023 1334   PHURINE 5.0 11/08/2023 1334   GLUCOSEU NEGATIVE 11/08/2023 1334   HGBUR NEGATIVE 11/08/2023 1334   BILIRUBINUR NEGATIVE 11/08/2023 1334   BILIRUBINUR negative 04/16/2021 1419   BILIRUBINUR neg 09/05/2019 1337   KETONESUR NEGATIVE 11/08/2023 1334   PROTEINUR 100 (A) 11/08/2023 1334   UROBILINOGEN 0.2 04/16/2021 1419   UROBILINOGEN 1.0 03/24/2007 0542   NITRITE NEGATIVE 11/08/2023 1334   LEUKOCYTESUR NEGATIVE 11/08/2023 1334   Sepsis Labs Recent Labs  Lab 11/08/23 1243 11/09/23 0431  WBC 4.0 5.8   Microbiology No results found for this or any previous visit (from the past 240 hours).   Time coordinating discharge: Over 30 minutes  SIGNED:   Elsie JAYSON [place], DO [facility] 11/09/2023, 5:06 PM Pager    If 7PM-7AM, please contact night-coverage [email or web address]

## 2023-11-09 NOTE — Plan of Care (Signed)
  Problem: Health Behavior/Discharge Planning: Goal: Ability to manage health-related needs will improve Outcome: Progressing   Problem: Clinical Measurements: Goal: Ability to maintain clinical measurements within normal limits will improve Outcome: Progressing   Problem: Coping: Goal: Level of anxiety will decrease Outcome: Progressing   

## 2023-11-09 NOTE — Plan of Care (Signed)

## 2023-11-09 NOTE — Telephone Encounter (Signed)
 Appt cancelled with Dr CAFFIE and rescheduled to 12/21/23@1 :30pm. See note below.

## 2023-11-09 NOTE — Progress Notes (Signed)
 Progress Note     Subjective: Pt reports she is still having some RUQ pain but overall feeling a little better. Denies nausea or vomiting. Having bowel function.   Objective: Vital signs in last 24 hours: Temp:  [97.4 F (36.3 C)-98.6 F (37 C)] 98.6 F (37 C) (10/02 0936) Pulse Rate:  [59-78] 62 (10/02 0936) Resp:  [16-19] 18 (10/02 0936) BP: (141-174)/(78-106) 145/86 (10/02 0936) SpO2:  [100 %] 100 % (10/02 0936) Weight:  [90.7 kg] 90.7 kg (10/01 1106) Last BM Date : 11/08/23  Intake/Output from previous day: No intake/output data recorded. Intake/Output this shift: No intake/output data recorded.  PE: General: pleasant, WD, WN female who is laying in bed in NAD HEENT: sclera anicteric Heart: regular, rate, and rhythm.   Lungs: Respiratory effort nonlabored Abd: soft, mild to moderate TTP in RUQ, no LLQ TTP, ND Psych: A&Ox3 with an appropriate affect.    Lab Results:  Recent Labs    11/08/23 1243 11/09/23 0431  WBC 4.0 5.8  HGB 10.5* 9.0*  HCT 35.6* 30.3*  PLT 420* 341   BMET Recent Labs    11/08/23 1243 11/09/23 0431  NA 138 139  K 3.6 3.6  CL 104 105  CO2 19* 23  GLUCOSE 109* 109*  BUN 9 12  CREATININE 0.87 0.86  CALCIUM  9.3 8.9   PT/INR No results for input(s): LABPROT, INR in the last 72 hours. CMP     Component Value Date/Time   NA 139 11/09/2023 0431   NA 138 10/03/2023 1003   K 3.6 11/09/2023 0431   CL 105 11/09/2023 0431   CO2 23 11/09/2023 0431   GLUCOSE 109 (H) 11/09/2023 0431   BUN 12 11/09/2023 0431   BUN 13 10/03/2023 1003   CREATININE 0.86 11/09/2023 0431   CREATININE 0.79 08/22/2023 1443   CREATININE 1.18 (H) 06/03/2021 0903   CALCIUM  8.9 11/09/2023 0431   PROT 6.5 11/09/2023 0431   PROT 7.8 06/09/2023 1330   ALBUMIN 3.6 11/09/2023 0431   ALBUMIN 4.2 06/09/2023 1330   AST 21 11/09/2023 0431   AST 17 08/22/2023 1443   ALT 17 11/09/2023 0431   ALT 19 08/22/2023 1443   ALKPHOS 90 11/09/2023 0431   BILITOT 0.2  11/09/2023 0431   BILITOT 0.3 08/22/2023 1443   GFRNONAA >60 11/09/2023 0431   GFRNONAA >60 08/22/2023 1443   GFRAA 101 09/05/2019 1120   Lipase     Component Value Date/Time   LIPASE 56 (H) 11/08/2023 1243       Studies/Results: CT ABDOMEN PELVIS W CONTRAST Result Date: 11/08/2023 EXAM: CT ABDOMEN AND PELVIS WITH CONTRAST 11/08/2023 02:21:36 PM TECHNIQUE: CT of the abdomen and pelvis was performed with the administration of 100 mL iohexol  (OMNIPAQUE ) 300 MG/ML solution. Multiplanar reformatted images are provided for review. Automated exposure control, iterative reconstruction, and/or weight-based adjustment of the mA/kV was utilized to reduce the radiation dose to as low as reasonably achievable. COMPARISON: MRI 02/26/2023 and CT scan 09/14/2023. CLINICAL HISTORY: Intra-abdominal abscess. Abdominal pain for months, referred from PCP to ED. FINDINGS: LOWER CHEST: No acute abnormality. LIVER: Faint septations in a 1.9 x 1.4 cm right hepatic lobe cyst on image 10 series 2 as reported on recent prior MRI. No nodularity. Technically speaking, cyst adenoma is not completely excluded. GALLBLADDER AND BILE DUCTS: Continued extrahepatic biliary dilatation with common bile duct 1.0 cm diameter with distal tapering in the vicinity of the ampulla. Gallbladder unremarkable. SPLEEN: No acute abnormality. PANCREAS: No acute abnormality. ADRENAL GLANDS:  No acute abnormality. KIDNEYS, URETERS AND BLADDER: Unchanged appearance of left kidney lower pole partial nephrectomy. No stones in the kidneys or ureters. No hydronephrosis. No perinephric or periureteral stranding. Urinary bladder is unremarkable. GI AND BOWEL: Stomach demonstrates no acute abnormality. Scattered colonic diverticulosis, concentrated in the sigmoid colon. Focal active diverticulitis in the sigmoid colon is observed on images 57 through 60 of series 2, with associated stranding in the adjacent sigmoid mesentery but no appreciable abscess or  extraluminal gas. The adjacent appendix appears unremarkable. Overall appearance compatible with mild-to-moderate acute sigmoid colon diverticulitis; this corresponds in position to site of prior inflammation along the sigmoid colon shown on 09/14/2023. There is no bowel obstruction. PERITONEUM AND RETROPERITONEUM: No ascites. No free air. VASCULATURE: Aorta is normal in caliber. LYMPH NODES: No lymphadenopathy. REPRODUCTIVE ORGANS: Mild lobularity of the uterus suggesting uterine fibroids. BONES AND SOFT TISSUES: Sizeable lateral and smaller medial osteochondromas of the left iliac bone. Suspected small osteochondroma of the right posterior proximal femoral metaphysis. Mild marginal lobularity in the iliac crests. Appearance favors multiple hereditary exostosis. No focal soft tissue abnormality. IMPRESSION: 1. Mild-to-moderate acute sigmoid diverticulitis without abscess or extraluminal gas, similar in location to 09/14/2023 . If this continues to fail to resolve despite appropriate therapy, further workup to exclude unusual predisposing factors such as an underlying tumor may be indicated. 2. Stable extrahepatic biliary dilatation with 1.0 cm common bile duct and distal tapering near the ampulla. 3. Right hepatic lobe cyst with faint septations; cystadenoma not fully excluded. 4. Postsurgical changes from left lower pole partial nephrectomy, unchanged. 5. Colonic diverticulosis, greatest in the sigmoid colon. 6. 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 7. Mild uterine lobularity suggesting fibroids. Electronically signed by: Ryan Salvage MD 11/08/2023 02:59 PM EDT RP Workstation: [id number]   US  Abdomen Limited RUQ (LIVER/GB) Result Date: 11/08/2023 CLINICAL DATA:  Right upper quadrant pain several months. EXAM: ULTRASOUND ABDOMEN LIMITED RIGHT UPPER QUADRANT COMPARISON:  CT abdomen 09/14/2023 and MRI abdomen 10/27/2023. FINDINGS: Gallbladder: No  evidence of gallstones or wall thickening. No adjacent free fluid. Negative sonographic Murphy sign. Common bile duct: Diameter: Common bile duct measures 10.8 mm which is unchanged from recent CT and MRI. Liver: Mild increased parenchymal echogenicity compatible with a degree of steatosis. 2 cm cyst over the right lobe. Portal vein is patent on color Doppler imaging with normal direction of blood flow towards the liver. Other: No free fluid. IMPRESSION: 1. No acute findings. 2. Stable dilatation of the common bile duct measuring 10.8 mm with no evidence of ductal stone or mass on recent MRI. 3. Mild hepatic steatosis.  2 cm liver cyst. Electronically Signed   By: Toribio Agreste M.D.   On: 11/08/2023 12:52    Anti-infectives: Anti-infectives (From admission, onward)    Start     Dose/Rate Route Frequency Ordered Stop   11/08/23 1545  piperacillin -tazobactam (ZOSYN ) IVPB 3.375 g        3.375 g 100 mL/hr over 30 Minutes Intravenous  Once 11/08/23 1540 11/08/23 1740        Assessment/Plan RUQ pain Persistent Sigmoid diverticulitis  - CT today with mild to moderate sigmoid diverticulitis w/o abscess or extraluminal gas, similar in location to 09/14/23, stable extrahepatic biliary dilatation with 1 cm CBD and distal tapering near ampulla, right hepatic lobe cyst with faint septations, cystadenoma not fully excluded, postsurgical changes from left lower pole partial nephrectomy, 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 - HIDA today  - In setting of persistent diverticulitis may need to consider surgical  intervention, will discuss with colorectal surgery as well  - We will continue to follow     FEN: NPO; IVF per primary team VTE: ok to have SQH or LMWH from surgical standpoint  ID: Zosyn  10/1   - per [facility] -  HTN HLD T2DM RCC s/p partial L nephrectomy IDA Palpitations/PVCs Gastritis/duodenitis CBD dilation and pancreatic cyst Anxiety    LOS: 0 days   I reviewed hospitalist notes, last 24 h vitals and pain scores, last 48 h intake and output, last 24 h labs and trends, and last 24 h imaging results.  This care required moderate level of medical decision making.    Burnard JONELLE Louder, PA-C  [facility] 11/09/2023, 10:00 AM Please see Amion for pager number during day hours 7:00am-4:30pm

## 2023-11-10 ENCOUNTER — Telehealth: Payer: Self-pay | Admitting: Internal Medicine

## 2023-11-10 LAB — CA 125: Cancer Antigen (CA) 125: 11.5 U/mL (ref 0.0–38.1)

## 2023-11-10 LAB — CEA: CEA: 2.6 ng/mL (ref 0.0–4.7)

## 2023-11-10 LAB — CANCER ANTIGEN 19-9: CA 19-9: 12 U/mL (ref 0–35)

## 2023-11-10 NOTE — Telephone Encounter (Signed)
 Inbound call from patient stating she was recently hospitalized and they couldn't tell her what was wrong with her. Patient would like to speak to nurse to advise her on what she can do, patient states she can not wait until November 13th for double procedure.  Requesting a call back  Please advise Thank  you

## 2023-11-10 NOTE — Telephone Encounter (Signed)
 Left message for pt that her procedures were moved out 4-5 weeks due to her having acute diverticulitis and being hospitalized. It had to be pushed out to allow any diverticulitis to heal. Left message for her to call back if she had any questions.

## 2023-11-15 ENCOUNTER — Encounter: Admitting: Gastroenterology

## 2023-11-23 DIAGNOSIS — K8001 Calculus of gallbladder with acute cholecystitis with obstruction: Secondary | ICD-10-CM | POA: Diagnosis not present

## 2023-11-23 DIAGNOSIS — R935 Abnormal findings on diagnostic imaging of other abdominal regions, including retroperitoneum: Secondary | ICD-10-CM | POA: Diagnosis not present

## 2023-11-23 DIAGNOSIS — K5732 Diverticulitis of large intestine without perforation or abscess without bleeding: Secondary | ICD-10-CM | POA: Diagnosis not present

## 2023-11-23 DIAGNOSIS — I10 Essential (primary) hypertension: Secondary | ICD-10-CM | POA: Diagnosis not present

## 2023-11-24 ENCOUNTER — Other Ambulatory Visit: Payer: Self-pay

## 2023-11-24 ENCOUNTER — Encounter (HOSPITAL_COMMUNITY): Payer: Self-pay

## 2023-11-24 ENCOUNTER — Ambulatory Visit
Admission: RE | Admit: 2023-11-24 | Discharge: 2023-11-24 | Disposition: A | Discharge status: Home / Self Care | Source: Ambulatory Visit | Attending: Nurse Practitioner | Admitting: Nurse Practitioner

## 2023-11-24 ENCOUNTER — Other Ambulatory Visit (HOSPITAL_COMMUNITY): Payer: Self-pay

## 2023-11-24 DIAGNOSIS — Z1231 Encounter for screening mammogram for malignant neoplasm of breast: Secondary | ICD-10-CM | POA: Diagnosis not present

## 2023-11-24 MED ORDER — AMOXICILLIN-POT CLAVULANATE 875-125 MG PO TABS
1.0000 | ORAL_TABLET | Freq: Two times a day (BID) | ORAL | 0 refills | Status: AC
  Filled 2023-11-24 (×2): qty 20, 10d supply, fill #0

## 2023-11-24 MED ORDER — HYDROCODONE-ACETAMINOPHEN 5-325 MG PO TABS
1.0000 | ORAL_TABLET | Freq: Four times a day (QID) | ORAL | 0 refills | Status: AC | PRN
  Filled 2023-11-24 (×2): qty 12, 3d supply, fill #0

## 2023-11-29 ENCOUNTER — Ambulatory Visit: Payer: Self-pay | Admitting: Nurse Practitioner

## 2023-11-29 ENCOUNTER — Other Ambulatory Visit: Payer: Self-pay

## 2023-11-29 ENCOUNTER — Encounter (HOSPITAL_COMMUNITY): Payer: Self-pay

## 2023-11-29 DIAGNOSIS — I10 Essential (primary) hypertension: Secondary | ICD-10-CM

## 2023-11-29 MED ORDER — VALSARTAN 80 MG PO TABS
80.0000 mg | ORAL_TABLET | Freq: Every day | ORAL | 1 refills | Status: DC
  Filled 2023-11-29: qty 90, 90d supply, fill #0

## 2023-11-29 NOTE — Telephone Encounter (Addendum)
 Hi there, also happy to offer interim med adjustment while we await a visit with Dr. Floretta as I had met patient in 2023. I have reviewed her updated records. In 08/2023 her spironolactone  and valsartan  were paused due to mild AKI in setting of sigmoid colon abscess requiring antibiotics. Creatinine has been normal August through October, as recently as 11/23/23. Given DM, would be helpful to be on ARB, but will need to follow renal function given prior AKI (though this previously seemed to occur in setting of physiologic stressors or concomitant diuretic use). Seems reasonable to re-trial ARB to start.  Nursing team - recommend to please restart valsartan  at prior 80mg  dose for now with recheck BMET in 1 week. Please arrange close follow-up with Dr. Floretta as requested. Have patient check BP at least 2-3 hours after AM medications and keep a full log. Bring to next OV.

## 2023-11-30 ENCOUNTER — Other Ambulatory Visit: Payer: Self-pay

## 2023-12-01 ENCOUNTER — Inpatient Hospital Stay: Attending: Nurse Practitioner

## 2023-12-04 ENCOUNTER — Telehealth: Payer: Self-pay

## 2023-12-04 NOTE — Telephone Encounter (Signed)
 Called the patient and scheduled her a follow up appointment with Dr Floretta on Tuesday December 12, 2023. Patient agreeable and voiced understanding.

## 2023-12-05 ENCOUNTER — Encounter: Payer: Self-pay | Admitting: Hematology and Oncology

## 2023-12-05 ENCOUNTER — Other Ambulatory Visit: Payer: Self-pay

## 2023-12-08 ENCOUNTER — Inpatient Hospital Stay: Admitting: Hematology and Oncology

## 2023-12-11 NOTE — Progress Notes (Signed)
 Cardiology Office Note:   Date:  12/11/2023  ID:  Kaylee Maxwell, DOB 1975-04-03, MRN [id number] PCP: Paseda, [name], FNP  [facility] Providers Cardiologist:  None { Chief Complaint: No chief complaint on file.     History of Present Illness:   Kaylee Maxwell is a 48 y.o. female with a PMH of HTN, HLD, DM2, prior tobacco use, and renal mass s/p L partial nephrectomy who presents for follow up.     Past Medical History:  Diagnosis Date   Abscess of sigmoid colon due to diverticulitis 08/08/2023   Anxiety 03/04/2018   Breast cancer screening 03/04/2018   Heart palpitations 03/04/2018   Monitor 9/21: normal sinus rhythm, sinus tachycardia, Avg HR 110; occ PVCs   Hypertension    Iron deficiency    Irregular heartbeat 03/04/2018   Mild mitral regurgitation    Obesity    Osteochondroma    PVC's (premature ventricular contractions)    Scalp mass 10/09/2012   Sinus tachycardia    Type 2 diabetes mellitus (HCC)      Studies Reviewed:    EKG: ***       Cardiac Studies & Procedures   ______________________________________________________________________________________________     ECHOCARDIOGRAM  ECHOCARDIOGRAM COMPLETE 06/03/2021  Narrative ECHOCARDIOGRAM REPORT    Patient Name:   Kaylee Maxwell Date of Exam: 06/03/2021 Medical Rec #:  [id number]       Height:       64.0 in Accession #:    [id number]      Weight:       197.4 lb Date of Birth:  [birth year]       BSA:          1.946 m Patient Age:    46 years        BP:           116/80 mmHg Patient Gender: F               HR:           107 bpm. Exam Location:  [address]  Procedure: 2D Echo, Cardiac Doppler, Color Doppler and Intracardiac Opacification Agent  Indications:    R00.0 Tachycardia; Z01.818 Pre-op evaluation  History:        Patient has no prior history of Echocardiogram examinations. Arrythmias:PVC; Risk Factors:Hypertension and Diabetes. Anemia. Palpitations.  Obesity.  Sonographer:    Jon Hacker RCS Referring Phys: 65 DAYNA N DUNN  IMPRESSIONS   1. Left ventricular ejection fraction, by estimation, is 55 to 60%. The left ventricle has normal function. The left ventricle has no regional wall motion abnormalities. Left ventricular diastolic parameters are consistent with Grade I diastolic dysfunction (impaired relaxation). 2. Right ventricular systolic function is normal. The right ventricular size is normal. Tricuspid regurgitation signal is inadequate for assessing PA pressure. 3. The mitral valve is normal in structure. Mild mitral valve regurgitation. No evidence of mitral stenosis. 4. The aortic valve is tricuspid. Aortic valve regurgitation is not visualized. No aortic stenosis is present. 5. The inferior vena cava is normal in size with greater than 50% respiratory variability, suggesting right atrial pressure of 3 mmHg.  FINDINGS Left Ventricle: Left ventricular ejection fraction, by estimation, is 55 to 60%. The left ventricle has normal function. The left ventricle has no regional wall motion abnormalities. The left ventricular internal cavity size was normal in size. There is no left ventricular hypertrophy. Left ventricular diastolic parameters are consistent with Grade I diastolic dysfunction (impaired relaxation).  Right Ventricle:  The right ventricular size is normal. No increase in right ventricular wall thickness. Right ventricular systolic function is normal. Tricuspid regurgitation signal is inadequate for assessing PA pressure.  Left Atrium: Left atrial size was normal in size.  Right Atrium: Right atrial size was normal in size.  Pericardium: There is no evidence of pericardial effusion.  Mitral Valve: The mitral valve is normal in structure. Mild mitral valve regurgitation. No evidence of mitral valve stenosis.  Tricuspid Valve: The tricuspid valve is normal in structure. Tricuspid valve regurgitation is not  demonstrated.  Aortic Valve: The aortic valve is tricuspid. Aortic valve regurgitation is not visualized. No aortic stenosis is present.  Pulmonic Valve: The pulmonic valve was normal in structure. Pulmonic valve regurgitation is not visualized.  Aorta: The aortic root is normal in size and structure.  Venous: The inferior vena cava is normal in size with greater than 50% respiratory variability, suggesting right atrial pressure of 3 mmHg.  IAS/Shunts: No atrial level shunt detected by color flow Doppler.   LEFT VENTRICLE PLAX 2D LVIDd:         4.00 cm   Diastology LVIDs:         2.80 cm   LV e' medial:    5.55 cm/s LV PW:         1.10 cm   LV E/e' medial:  8.6 LV IVS:        1.10 cm   LV e' lateral:   6.42 cm/s LVOT diam:     2.30 cm   LV E/e' lateral: 7.4 LV SV:         47 LV SV Index:   24 LVOT Area:     4.15 cm   RIGHT VENTRICLE RV Basal diam:  2.70 cm RV S prime:     12.00 cm/s TAPSE (M-mode): 1.6 cm  LEFT ATRIUM             Index        RIGHT ATRIUM           Index LA diam:        2.80 cm 1.44 cm/m   RA Area:     10.60 cm LA Vol (A2C):   25.5 ml 13.11 ml/m  RA Volume:   22.80 ml  11.72 ml/m LA Vol (A4C):   36.8 ml 18.92 ml/m LA Biplane Vol: 32.6 ml 16.76 ml/m AORTIC VALVE LVOT Vmax:   65.50 cm/s LVOT Vmean:  41.400 cm/s LVOT VTI:    0.113 m  AORTA Ao Root diam: 3.30 cm Ao Asc diam:  2.80 cm  MITRAL VALVE MV Area (PHT): 4.83 cm    SHUNTS MV Decel Time: 157 msec    Systemic VTI:  0.11 m MV E velocity: 47.70 cm/s  Systemic Diam: 2.30 cm MV A velocity: 70.20 cm/s MV E/A ratio:  0.68  Dalton McleanMD Electronically signed by Ezra Kanner Signature Date/Time: 06/03/2021/1:25:42 PM    Final    MONITORS  LONG TERM MONITOR (3-14 DAYS) 11/05/2019  Narrative Sinus rhythm, sinus tachycardia   77 to 160 bpm   Average HR 110 bpm Occasional PVCs       ______________________________________________________________________________________________       Risk Assessment/Calculations:   {Does this patient have ATRIAL FIBRILLATION?[phone]} No BP recorded.  {Refresh Note OR Click here to enter BP  :1}***        Physical Exam:     VS:  There were no vitals taken for this visit. ***    Wt  Readings from Last 3 Encounters:  11/08/23 200 lb (90.7 kg)  10/30/23 199 lb (90.3 kg)  10/03/23 198 lb (89.8 kg)     GEN: Well nourished, well developed, in no acute distress NECK: No JVD; No carotid bruits CARDIAC: ***RRR, no murmurs, rubs, gallops RESPIRATORY:  Clear to auscultation without rales, wheezing or rhonchi  ABDOMEN: Soft, non-tender, non-distended, normal bowel sounds EXTREMITIES:  Warm and well perfused, no edema; No deformity, 2+ radial pulses PSYCH: Normal mood and affect   Assessment & Plan Primary hypertension       {Are you ordering a CV Procedure (e.g. stress test, cath, DCCV, TEE, etc)?   Press F2        :[id number]}   This note was written with the assistance of a dictation microphone or AI dictation software. Please excuse any typos or grammatical errors.   Signed, Georganna Archer, MD 12/11/2023 8:48 PM    [facility]

## 2023-12-12 ENCOUNTER — Ambulatory Visit
Attending: Student in an Organized Health Care Education/Training Program | Admitting: Student in an Organized Health Care Education/Training Program

## 2023-12-12 ENCOUNTER — Other Ambulatory Visit: Payer: Self-pay

## 2023-12-12 ENCOUNTER — Other Ambulatory Visit (HOSPITAL_COMMUNITY): Payer: Self-pay

## 2023-12-12 VITALS — BP 150/72 | HR 104 | Ht 65.0 in | Wt 204.0 lb

## 2023-12-12 DIAGNOSIS — E785 Hyperlipidemia, unspecified: Secondary | ICD-10-CM

## 2023-12-12 DIAGNOSIS — I10 Essential (primary) hypertension: Secondary | ICD-10-CM

## 2023-12-12 MED ORDER — VALSARTAN 160 MG PO TABS
160.0000 mg | ORAL_TABLET | Freq: Every day | ORAL | 3 refills | Status: DC
  Filled 2023-12-12: qty 30, 30d supply, fill #0
  Filled 2024-01-09: qty 30, 30d supply, fill #1
  Filled 2024-02-13: qty 30, 30d supply, fill #2
  Filled 2024-03-11: qty 30, 30d supply, fill #3

## 2023-12-12 MED ORDER — ATORVASTATIN CALCIUM 20 MG PO TABS
20.0000 mg | ORAL_TABLET | Freq: Every day | ORAL | 3 refills | Status: DC
  Filled 2023-12-12: qty 30, 30d supply, fill #0
  Filled 2024-01-09: qty 30, 30d supply, fill #1
  Filled 2024-02-13: qty 30, 30d supply, fill #2
  Filled 2024-03-11: qty 30, 30d supply, fill #3

## 2023-12-12 NOTE — Assessment & Plan Note (Signed)
-   Fortunately blood pressure is not very well-controlled despite good medication adherence.  Her recent weight gain and physical activity are contributors, but she is also been dealing with hypertension for a long time. -I will plan to increase her valsartan  and counseled the patient on the importance of adhering to a low-salt diet and exercise and control her blood pressures. -She will continue to monitor her blood pressures at home as well. Increase valsartan  to 160 mg daily BMP in 1 week Follow-up in 3 months

## 2023-12-12 NOTE — Patient Instructions (Signed)
 Medication Instructions:  INCREASE Valsartan  to 160 mg once daily  INCREASE Atorvastatin  (Lipitor) to 20 mg once daily   *If you need a refill on your cardiac medications before your next appointment, please call your pharmacy*  Lab Work: To be completed in 1 week: BMP  If you have labs (blood work) drawn today and your tests are completely normal, you will receive your results only by: MyChart Message (if you have MyChart) OR A paper copy in the mail If you have any lab test that is abnormal or we need to change your treatment, we will call you to review the results.  Testing/Procedures: None ordered today.  Follow-Up: At [facility], you and your health needs are our priority.  As part of our continuing mission to provide you with exceptional heart care, our providers are all part of one team.  This team includes your primary Cardiologist (physician) and Advanced Practice Providers or APPs (Physician Assistants and Nurse Practitioners) who all work together to provide you with the care you need, when you need it.  Your next appointment:   3 month(s)  Provider:   Dr. Floretta

## 2023-12-21 ENCOUNTER — Encounter: Admitting: Gastroenterology

## 2023-12-21 ENCOUNTER — Encounter: Payer: Self-pay | Admitting: Internal Medicine

## 2023-12-21 ENCOUNTER — Ambulatory Visit (AMBULATORY_SURGERY_CENTER): Admitting: Internal Medicine

## 2023-12-21 VITALS — BP 135/93 | HR 89 | Temp 98.6°F | Resp 24 | Ht 65.0 in | Wt 204.0 lb

## 2023-12-21 DIAGNOSIS — E119 Type 2 diabetes mellitus without complications: Secondary | ICD-10-CM | POA: Diagnosis not present

## 2023-12-21 DIAGNOSIS — I10 Essential (primary) hypertension: Secondary | ICD-10-CM | POA: Diagnosis not present

## 2023-12-21 DIAGNOSIS — Z8719 Personal history of other diseases of the digestive system: Secondary | ICD-10-CM

## 2023-12-21 DIAGNOSIS — K2970 Gastritis, unspecified, without bleeding: Secondary | ICD-10-CM

## 2023-12-21 DIAGNOSIS — R1011 Right upper quadrant pain: Secondary | ICD-10-CM | POA: Diagnosis not present

## 2023-12-21 DIAGNOSIS — I493 Ventricular premature depolarization: Secondary | ICD-10-CM | POA: Diagnosis not present

## 2023-12-21 DIAGNOSIS — K5730 Diverticulosis of large intestine without perforation or abscess without bleeding: Secondary | ICD-10-CM | POA: Diagnosis not present

## 2023-12-21 DIAGNOSIS — E669 Obesity, unspecified: Secondary | ICD-10-CM | POA: Diagnosis not present

## 2023-12-21 DIAGNOSIS — D509 Iron deficiency anemia, unspecified: Secondary | ICD-10-CM | POA: Diagnosis not present

## 2023-12-21 DIAGNOSIS — K319 Disease of stomach and duodenum, unspecified: Secondary | ICD-10-CM | POA: Diagnosis not present

## 2023-12-21 DIAGNOSIS — F419 Anxiety disorder, unspecified: Secondary | ICD-10-CM | POA: Diagnosis not present

## 2023-12-21 MED ORDER — SODIUM CHLORIDE 0.9 % IV SOLN: 500.0000 mL | INTRAVENOUS | Status: DC

## 2023-12-21 NOTE — Progress Notes (Signed)
 Report given to PACU, vss

## 2023-12-21 NOTE — Patient Instructions (Signed)
 Resume previous diet and medications. Awaiting pathology results. Repeat MRI/MRCP in 1 year. Repeat Colonoscopy in 10 years for screening purposes.  YOU HAD AN ENDOSCOPIC PROCEDURE TODAY AT THE [facility]:   Refer to the procedure report that was given to you for any specific questions about what was found during the examination.  If the procedure report does not answer your questions, please call your gastroenterologist to clarify.  If you requested that your care partner not be given the details of your procedure findings, then the procedure report has been included in a sealed envelope for you to review at your convenience later.  YOU SHOULD EXPECT: Some feelings of bloating in the abdomen. Passage of more gas than usual.  Walking can help get rid of the air that was put into your GI tract during the procedure and reduce the bloating. If you had a lower endoscopy (such as a colonoscopy or flexible sigmoidoscopy) you may notice spotting of blood in your stool or on the toilet paper. If you underwent a bowel prep for your procedure, you may not have a normal bowel movement for a few days.  Please Note:  You might notice some irritation and congestion in your nose or some drainage.  This is from the oxygen used during your procedure.  There is no need for concern and it should clear up in a day or so.  SYMPTOMS TO REPORT IMMEDIATELY:  Following lower endoscopy (colonoscopy or flexible sigmoidoscopy):  Excessive amounts of blood in the stool  Significant tenderness or worsening of abdominal pains  Swelling of the abdomen that is new, acute  Fever of 100F or higher  Following upper endoscopy (EGD)  Vomiting of blood or coffee ground material  New chest pain or pain under the shoulder blades  Painful or persistently difficult swallowing  New shortness of breath  Fever of 100F or higher  Black, tarry-looking stools  For urgent or emergent issues, a gastroenterologist can be  reached at any hour by calling [phone]. Do not use MyChart messaging for urgent concerns.    DIET:  We do recommend a small meal at first, but then you may proceed to your regular diet.  Drink plenty of fluids but you should avoid alcoholic beverages for 24 hours.  ACTIVITY:  You should plan to take it easy for the rest of today and you should NOT DRIVE or use heavy machinery until tomorrow (because of the sedation medicines used during the test).    FOLLOW UP: Our staff will call the number listed on your records the next business day following your procedure.  We will call around 7:15- 8:00 am to check on you and address any questions or concerns that you may have regarding the information given to you following your procedure. If we do not reach you, we will leave a message.     If any biopsies were taken you will be contacted by phone or by letter within the next 1-3 weeks.  Please call us  at [phone] if you have not heard about the biopsies in 3 weeks.    SIGNATURES/CONFIDENTIALITY: You and/or your care partner have signed paperwork which will be entered into your electronic medical record.  These signatures attest to the fact that that the information above on your After Visit Summary has been reviewed and is understood.  Full responsibility of the confidentiality of this discharge information lies with you and/or your care-partner.

## 2023-12-21 NOTE — Progress Notes (Signed)
 GASTROENTEROLOGY PROCEDURE H&P NOTE   Primary Care Physician: Paseda, [name], FNP    Reason for Procedure:  IDA, history of diverticulitis  Plan:    EGD and colonoscopy  Patient is appropriate for endoscopic procedure(s) in the ambulatory ([facility]) setting.  The nature of the procedure, as well as the risks, benefits, and alternatives were carefully and thoroughly reviewed with the patient. Ample time for discussion and questions allowed.  All questions were answered. The patient understood, was satisfied, and agreed with the plan to proceed.    HPI: Kaylee Maxwell is a 48 y.o. female who presents for EGD and colonoscopy.  Medical history as below.  Tolerated the prep.  No recent chest pain or shortness of breath.  No abdominal pain today.  Past Medical History:  Diagnosis Date   Abscess of sigmoid colon due to diverticulitis 08/08/2023   Anxiety 03/04/2018   Breast cancer screening 03/04/2018   Heart palpitations 03/04/2018   Monitor 9/21: normal sinus rhythm, sinus tachycardia, Avg HR 110; occ PVCs   Hypertension    Iron deficiency    Irregular heartbeat 03/04/2018   Mild mitral regurgitation    Obesity    Osteochondroma    PVC's (premature ventricular contractions)    Scalp mass 10/09/2012   Sinus tachycardia    Type 2 diabetes mellitus [facility])     Past Surgical History:  Procedure Laterality Date   ANKLE FRACTURE SURGERY Right    BIOPSY  02/02/2023   Procedure: BIOPSY;  Surgeon: Wilhelmenia Aloha Raddle., MD;  Location: [facility];  Service: Gastroenterology;;   ROMAYNE Right    cyst removal from scalp     ESOPHAGOGASTRODUODENOSCOPY N/A 02/02/2023   Procedure: ESOPHAGOGASTRODUODENOSCOPY (EGD);  Surgeon: Wilhelmenia Aloha Raddle., MD;  Location: [facility];  Service: Gastroenterology;  Laterality: N/A;   EUS N/A 02/02/2023   Procedure: UPPER ENDOSCOPIC ULTRASOUND (EUS) RADIAL;  Surgeon: Wilhelmenia Aloha Raddle., MD;  Location: [facility];  Service:  Gastroenterology;  Laterality: N/A;   left leg bone removal     ROBOTIC ASSITED PARTIAL NEPHRECTOMY Left 07/04/2023   Procedure: XI ROBOTIC ASSITED LEFT  PARTIAL NEPHRECTOMY;  Surgeon: Devere Lonni Righter, MD;  Location: [facility];  Service: Urology;  Laterality: Left;  180 MINUTES    Prior to Admission medications   Medication Sig Start Date End Date Taking? Authorizing Provider  acetaminophen  (TYLENOL ) 325 MG tablet Take 2 tablets (650 mg total) by mouth every 6 (six) hours as needed for mild pain (pain score 1-3), fever or moderate pain (pain score 4-6) (or Fever >/= 101). 09/03/23   Hongalgi, Anand D, MD  amoxicillin -clavulanate (AUGMENTIN ) 875-125 MG tablet Take one tablet by mouth every 12 (twelve) hours for 10 days. Patient not taking: Reported on 12/12/2023 11/23/23     aspirin  EC 81 MG tablet Take 81 mg by mouth in the morning. Swallow whole.    [provider]  atorvastatin  (LIPITOR) 20 MG tablet Take 1 tablet (20 mg total) by mouth daily. 12/12/23   Floretta Mallard, MD  ferrous sulfate  325 (65 FE) MG EC tablet Take 1 tablet (325 mg total) by mouth daily. Patient taking differently: Take 325 mg by mouth at bedtime. 06/13/23   Paseda, [name], FNP  glucose blood (GNP TRUE METRIX GLUCOSE STRIPS) test strip Use as instructed 04/19/22   Paseda, [name], FNP  HYDROcodone -acetaminophen  (NORCO/VICODIN) 5-325 MG tablet Take one tablet by mouth every 6 (six) hours as needed for Pain for up to 3 days. Max Daily Amount: 4  tablets 11/23/23     hyoscyamine  (LEVSIN  SL) 0.125 MG SL tablet Place 1 tablet (0.125 mg total) under the tongue every 6 (six) hours as needed. Patient not taking: Reported on 12/12/2023 10/26/23   Heinz, Sara E, PA-C  Lancets (FREESTYLE) lancets Use as directed 04/19/22   Paseda, [name], FNP  metFORMIN  (GLUCOPHAGE -XR) 500 MG 24 hr tablet Take 500 mg with breakfast daily by mouth  for one week , then increase to 500 mg by mouth twice daily with meal Patient  taking differently: Take 500 mg by mouth in the morning and at bedtime. 06/07/23   Paseda, [name], FNP  metoprolol  succinate (TOPROL -XL) 50 MG 24 hr tablet Take 1 tablet (50 mg total) by mouth daily. Take with or immediately following a meal. Please schedule follow up appt for more refills 09/07/23   Dick, Ernest H Jr., NP  NON FORMULARY Take by mouth See admin instructions. Seaweed extract - Place 1 dropperful in the mouth, then swallow once a day    [provider]  NON FORMULARY Take by mouth See admin instructions. Soursop fruit extract - Place 1 dropperful in the mouth, then swallow once a day    [provider]  OREGANO PO Take by mouth See admin instructions. Oregano oil - Place 1 dropperful in the mouth, then swallow once a day    [provider]  pantoprazole  (PROTONIX ) 40 MG tablet Take 1 tablet (40 mg total) by mouth 2 (two) times daily. Patient taking differently: Take 40 mg by mouth in the morning and at bedtime. 10/03/23   Heinz, Camie BRAVO, PA-C  traZODone  (DESYREL ) 50 MG tablet Take 1 tablet (50 mg total) by mouth at bedtime as needed for sleep 05/03/23   Paseda, [name], FNP  valsartan  (DIOVAN ) 160 MG tablet Take 1 tablet (160 mg total) by mouth daily. 12/12/23   Floretta Mallard, MD    Current Outpatient Medications  Medication Sig Dispense Refill   acetaminophen  (TYLENOL ) 325 MG tablet Take 2 tablets (650 mg total) by mouth every 6 (six) hours as needed for mild pain (pain score 1-3), fever or moderate pain (pain score 4-6) (or Fever >/= 101).     amoxicillin -clavulanate (AUGMENTIN ) 875-125 MG tablet Take one tablet by mouth every 12 (twelve) hours for 10 days. (Patient not taking: Reported on 12/12/2023) 20 tablet 0   aspirin  EC 81 MG tablet Take 81 mg by mouth in the morning. Swallow whole.     atorvastatin  (LIPITOR) 20 MG tablet Take 1 tablet (20 mg total) by mouth daily. 30 tablet 3   ferrous sulfate  325 (65 FE) MG EC tablet Take 1 tablet (325 mg  total) by mouth daily. (Patient taking differently: Take 325 mg by mouth at bedtime.) 90 tablet 1   glucose blood (GNP TRUE METRIX GLUCOSE STRIPS) test strip Use as instructed 100 each 12   HYDROcodone -acetaminophen  (NORCO/VICODIN) 5-325 MG tablet Take one tablet by mouth every 6 (six) hours as needed for Pain for up to 3 days. Max Daily Amount: 4 tablets 12 tablet 0   hyoscyamine  (LEVSIN  SL) 0.125 MG SL tablet Place 1 tablet (0.125 mg total) under the tongue every 6 (six) hours as needed. (Patient not taking: Reported on 12/12/2023) 30 tablet 1   Lancets (FREESTYLE) lancets Use as directed 100 each 12   metFORMIN  (GLUCOPHAGE -XR) 500 MG 24 hr tablet Take 500 mg with breakfast daily by mouth  for one week , then increase to 500 mg by mouth twice daily  with meal (Patient taking differently: Take 500 mg by mouth in the morning and at bedtime.) 180 tablet 1   metoprolol  succinate (TOPROL -XL) 50 MG 24 hr tablet Take 1 tablet (50 mg total) by mouth daily. Take with or immediately following a meal. Please schedule follow up appt for more refills 90 tablet 3   NON FORMULARY Take by mouth See admin instructions. Seaweed extract - Place 1 dropperful in the mouth, then swallow once a day     NON FORMULARY Take by mouth See admin instructions. Soursop fruit extract - Place 1 dropperful in the mouth, then swallow once a day     OREGANO PO Take by mouth See admin instructions. Oregano oil - Place 1 dropperful in the mouth, then swallow once a day     pantoprazole  (PROTONIX ) 40 MG tablet Take 1 tablet (40 mg total) by mouth 2 (two) times daily. (Patient taking differently: Take 40 mg by mouth in the morning and at bedtime.) 180 tablet 0   traZODone  (DESYREL ) 50 MG tablet Take 1 tablet (50 mg total) by mouth at bedtime as needed for sleep 90 tablet 1   valsartan  (DIOVAN ) 160 MG tablet Take 1 tablet (160 mg total) by mouth daily. 30 tablet 3   Current Facility-Administered Medications  Medication Dose Route Frequency  Provider Last Rate Last Admin   0.9 %  sodium chloride  infusion  500 mL Intravenous Continuous Sohil Timko, Gordy HERO, MD        Allergies as of 12/21/2023 - Review Complete 12/21/2023  Allergen Reaction Noted   Ozempic  (0.25 or 0.5 mg-dose) [semaglutide (0.25 or 0.5mg -dos)] Other (See Comments) 06/06/2023    Family History  Problem Relation Age of Onset   Diabetes Mother    Hypertension Mother    Obesity Mother    Hypertension Father    Cancer Paternal Aunt        pancreatic   Cancer Maternal Grandmother        lung   Autism spectrum disorder Sister     Social History   Socioeconomic History   Marital status: Single    Spouse name: Not on file   Number of children: 2   Years of education: Not on file   Highest education level: 12th grade  Occupational History   Not on file  Tobacco Use   Smoking status: Former    Current packs/day: 0.50    Average packs/day: 0.5 packs/day for 18.0 years (9.0 ttl pk-yrs)    Types: Cigarettes    Passive exposure: Never   Smokeless tobacco: Never  Vaping Use   Vaping status: Former   Substances: Nicotine , Flavoring  Substance and Sexual Activity   Alcohol use: Yes    Comment: occasional   Drug use: No   Sexual activity: Yes    Birth control/protection: None  Other Topics Concern   Not on file  Social History Narrative   Lives with her sons.    Social Drivers of Health   Financial Resource Strain: Medium Risk (09/08/2023)   Overall Financial Resource Strain (CARDIA)    Difficulty of Paying Living Expenses: Somewhat hard  Food Insecurity: No Food Insecurity (11/08/2023)   Hunger Vital Sign    Worried About Running Out of Food in the Last Year: Never true    Ran Out of Food in the Last Year: Never true  Transportation Needs: No Transportation Needs (11/08/2023)   PRAPARE - Administrator, Civil Service (Medical): No    Lack of Transportation (Non-Medical):  No  Physical Activity: Unknown (09/08/2023)   Exercise Vital Sign     Days of Exercise per Week: Patient declined    Minutes of Exercise per Session: Not on file  Stress: No Stress Concern Present (09/08/2023)   [employer] of Occupational Health - Occupational Stress Questionnaire    Feeling of Stress: Not at all  Social Connections: Moderately Isolated (09/08/2023)   Social Connection and Isolation Panel    Frequency of Communication with Friends and Family: Three times a week    Frequency of Social Gatherings with Friends and Family: Once a week    Attends Religious Services: More than 4 times per year    Active Member of [employer] or Organizations: No    Attends Engineer, Structural: Not on file    Marital Status: Never married  Intimate Partner Violence: Not At Risk (11/23/2023)   Received from [facility]   HITS    Over the last 12 months how often did your partner physically hurt you?: Never    Over the last 12 months how often did your partner insult you or talk down to you?: Never    Over the last 12 months how often did your partner threaten you with physical harm?: Never    Over the last 12 months how often did your partner scream or curse at you?: Never    Physical Exam: Vital signs in last 24 hours: @BP  (!) 136/92   Pulse (!) 103   Temp 98.6 F (37 C)   Ht 5' 5 (1.651 m)   Wt 204 lb (92.5 kg)   SpO2 98%   BMI 33.95 kg/m  GEN: NAD EYE: Sclerae anicteric ENT: MMM CV: Non-tachycardic Pulm: CTA b/l GI: Soft, NT/ND NEURO:  Alert & Oriented x 3   Gordy Starch, MD [facility]  12/21/2023 1:59 PM

## 2023-12-21 NOTE — Progress Notes (Signed)
1415 Robinul 0.1 mg IV given due large amount of secretions upon assessment.  MD made aware, vss  ?

## 2023-12-21 NOTE — Progress Notes (Signed)
 Called to room to assist during endoscopic procedure.  Patient ID and intended procedure confirmed with present staff. Received instructions for my participation in the procedure from the performing physician.

## 2023-12-21 NOTE — Op Note (Signed)
 [facility] Patient Name: Kaylee Maxwell Procedure Date: 12/21/2023 2:05 PM MRN: [id number] Endoscopist: Gordy CHRISTELLA Starch , MD, [id number] Age: 48 Referring MD:  Date of Birth: 09/10/1975 Gender: Female Account #: [id number] Procedure:                Colonoscopy Indications:              Iron deficiency anemia, follow-up of complicated                            diverticulitis (Aug 2025), 1st colonoscopy Medicines:                Monitored Anesthesia Care Procedure:                Pre-Anesthesia Assessment:                           - Prior to the procedure, a History and Physical                            was performed, and patient medications and                            allergies were reviewed. The patient's tolerance of                            previous anesthesia was also reviewed. The risks                            and benefits of the procedure and the sedation                            options and risks were discussed with the patient.                            All questions were answered, and informed consent                            was obtained. Prior Anticoagulants: The patient has                            taken no anticoagulant or antiplatelet agents. ASA                            Grade Assessment: II - A patient with mild systemic                            disease. After reviewing the risks and benefits,                            the patient was deemed in satisfactory condition to                            undergo the procedure.  After obtaining informed consent, the colonoscope                            was passed under direct vision. Throughout the                            procedure, the patient's blood pressure, pulse, and                            oxygen saturations were monitored continuously. The                            Olympus Scope [id number] was introduced through the                            anus and  advanced to the cecum, identified by                            appendiceal orifice and ileocecal valve. The                            colonoscopy was performed without difficulty. The                            patient tolerated the procedure well. The quality                            of the bowel preparation was good. The ileocecal                            valve, appendiceal orifice, and rectum were                            photographed. Scope In: 2:31:58 PM Scope Out: 2:41:06 PM Scope Withdrawal Time: 0 hours 7 minutes 24 seconds  Total Procedure Duration: 0 hours 9 minutes 8 seconds  Findings:                 The digital rectal exam was normal.                           Multiple medium-mouthed and small-mouthed                            diverticula were found in the sigmoid colon,                            descending colon, transverse colon and ascending                            colon.                           The exam was otherwise without abnormality on  direct and retroflexion views. Complications:            No immediate complications. Estimated Blood Loss:     Estimated blood loss: none. Impression:               - Moderate diverticulosis in the sigmoid colon, in                            the descending colon, in the transverse colon and                            in the ascending colon.                           - The examination was otherwise normal on direct                            and retroflexion views.                           - No specimens collected. Recommendation:           - Patient has a contact number available for                            emergencies. The signs and symptoms of potential                            delayed complications were discussed with the                            patient. Return to normal activities tomorrow.                            Written discharge instructions were provided to the                             patient.                           - Resume previous diet.                           - Continue present medications.                           - Repeat colonoscopy in 10 years for screening                            purposes. Gordy CHRISTELLA Starch, MD 12/21/2023 2:49:38 PM This report has been signed electronically.

## 2023-12-21 NOTE — Op Note (Signed)
 [facility] Patient Name: Kaylee Maxwell Procedure Date: 12/21/2023 2:06 PM MRN: [id number] Endoscopist: Gordy CHRISTELLA Starch , MD, [id number] Age: 48 Referring MD:  Date of Birth: 04/11/1975 Gender: Female Account #: [id number] Procedure:                Upper GI endoscopy Indications:              Abdominal pain in the right upper quadrant, Iron                            deficiency anemia Medicines:                Monitored Anesthesia Care Procedure:                Pre-Anesthesia Assessment:                           - Prior to the procedure, a History and Physical                            was performed, and patient medications and                            allergies were reviewed. The patient's tolerance of                            previous anesthesia was also reviewed. The risks                            and benefits of the procedure and the sedation                            options and risks were discussed with the patient.                            All questions were answered, and informed consent                            was obtained. Prior Anticoagulants: The patient has                            taken no anticoagulant or antiplatelet agents. ASA                            Grade Assessment: II - A patient with mild systemic                            disease. After reviewing the risks and benefits,                            the patient was deemed in satisfactory condition to                            undergo the procedure.  After obtaining informed consent, the endoscope was                            passed under direct vision. Throughout the                            procedure, the patient's blood pressure, pulse, and                            oxygen saturations were monitored continuously. The                            GIF [id number] #[id number] was introduced through the                            mouth, and advanced to the second part  of duodenum.                            The upper GI endoscopy was accomplished without                            difficulty. The patient tolerated the procedure                            well. Scope In: Scope Out: Findings:                 The examined esophagus was normal.                           Mild inflammation characterized by congestion                            (edema) and erythema was found in the gastric                            antrum. Biopsies were taken with a cold forceps for                            Helicobacter pylori testing.                           The examined duodenum was normal. Complications:            No immediate complications. Estimated Blood Loss:     Estimated blood loss: none. Impression:               - Normal esophagus.                           - Gastritis. Biopsied.                           - Normal examined duodenum. Recommendation:           - Patient has a contact number available for  emergencies. The signs and symptoms of potential                            delayed complications were discussed with the                            patient. Return to normal activities tomorrow.                            Written discharge instructions were provided to the                            patient.                           - Resume previous diet.                           - Continue present medications.                           - Await pathology results.                           - No further explain of post-prandial RUQ pain. EUS                            done in Dec 2024 for dilated CBD. MRI/MRCP recently                            stable (small pancreatitic side-branch IPMN, repeat                            MRI/MRCP recommended in 1 year). HIDA normal.                           - See the other procedure note for documentation of                            additional recommendations. Gordy CHRISTELLA Starch, MD 12/21/2023  2:47:39 PM This report has been signed electronically.

## 2023-12-22 ENCOUNTER — Telehealth: Payer: Self-pay

## 2023-12-22 NOTE — Telephone Encounter (Signed)
  Follow up Call-     12/21/2023    1:58 PM  Call back number  Post procedure Call Back phone  # [phone]  Permission to leave phone message Yes     Patient questions:  Do you have a fever, pain , or abdominal swelling? No. Pain Score  0 *  Have you tolerated food without any problems? Yes.    Have you been able to return to your normal activities? Yes.    Do you have any questions about your discharge instructions: Diet   No. Medications  No. Follow up visit  No.  Do you have questions or concerns about your Care? No.  Actions: * If pain score is 4 or above: No action needed, pain <4.

## 2023-12-25 ENCOUNTER — Telehealth: Payer: Self-pay | Admitting: Internal Medicine

## 2023-12-25 DIAGNOSIS — R1011 Right upper quadrant pain: Secondary | ICD-10-CM

## 2023-12-25 NOTE — Telephone Encounter (Signed)
 Spoke to patient who states that she continues to have a constant deep ache in the RUQ over the last several months. Pain is worse with certain spicy, fatty, greasy foods so she has altered her diet to avoid these. She also states that the pain worsens with having a bowel movement though she describes her bowel movements as normal and not constipated. She denies any heartburn, nausea or vomiting. No fevers.   She states that the hyoscyamine  given to her prevoiusly was unhelpful. States nobody knows what's going on with my body still and I need some medicine to help. If they can't figure it out, Id like to be sent to pain management or somewhere to get some help.

## 2023-12-25 NOTE — Telephone Encounter (Signed)
 Inbound call from patient stating she is still having severe abdominal pain. States she would like to be referred to pain management and have a prescription sent in to help. Please advise, thank you

## 2023-12-25 NOTE — Telephone Encounter (Signed)
 I am waiting on bx from recent EGD She has had CT, MRI/MRCP, HIDA, EGD/colon and EUS last year  No clear evidence to recommended cholecystectomy GES should be scheduled to exclude gastroparesis givenn the worsening post-prandially If this is normal and bx unrevealing then 2nd opinion with a [facility] such [facility] would be appropriate. JMP

## 2023-12-26 LAB — SURGICAL PATHOLOGY

## 2023-12-26 NOTE — Telephone Encounter (Signed)
  Patient is advised of Dr Pamula recommendations and she is agreeable to moving forward with gastric emptying scan. States she just wants to feel better.  She has been scheduled for gastric emptying scan at [facility] 01/09/24 at 730 am, 715 am arrival. NPO6 hours prior and no stomach meds 8 hours prior. Patient verbalizes understanding of this. She has also been provided the number to radiology scheduling should she need to change her appointment date.

## 2024-01-01 ENCOUNTER — Ambulatory Visit: Payer: Self-pay | Admitting: Internal Medicine

## 2024-01-01 ENCOUNTER — Encounter: Payer: Self-pay | Admitting: Family Medicine

## 2024-01-01 ENCOUNTER — Encounter: Payer: Self-pay | Admitting: Internal Medicine

## 2024-01-02 ENCOUNTER — Ambulatory Visit: Payer: Self-pay | Admitting: Nurse Practitioner

## 2024-01-02 NOTE — Telephone Encounter (Signed)
Pt scheduled for both appts

## 2024-01-09 ENCOUNTER — Other Ambulatory Visit: Payer: Self-pay | Admitting: Gastroenterology

## 2024-01-09 ENCOUNTER — Other Ambulatory Visit (HOSPITAL_COMMUNITY): Payer: Self-pay

## 2024-01-09 ENCOUNTER — Other Ambulatory Visit: Payer: Self-pay

## 2024-01-09 ENCOUNTER — Encounter (HOSPITAL_COMMUNITY): Admission: RE | Admit: 2024-01-09 | Source: Ambulatory Visit

## 2024-01-09 MED ORDER — PANTOPRAZOLE SODIUM 40 MG PO TBEC
40.0000 mg | DELAYED_RELEASE_TABLET | Freq: Two times a day (BID) | ORAL | 1 refills | Status: AC
  Filled 2024-01-09: qty 180, 90d supply, fill #0

## 2024-01-10 ENCOUNTER — Ambulatory Visit: Admitting: Family Medicine

## 2024-01-10 ENCOUNTER — Encounter: Payer: Self-pay | Admitting: Nurse Practitioner

## 2024-01-10 ENCOUNTER — Other Ambulatory Visit (HOSPITAL_COMMUNITY): Payer: Self-pay

## 2024-01-10 ENCOUNTER — Encounter: Payer: Self-pay | Admitting: Family Medicine

## 2024-01-10 VITALS — BP 148/80 | HR 73 | Ht 65.0 in | Wt 207.8 lb

## 2024-01-10 DIAGNOSIS — R109 Unspecified abdominal pain: Secondary | ICD-10-CM | POA: Diagnosis not present

## 2024-01-10 DIAGNOSIS — G8929 Other chronic pain: Secondary | ICD-10-CM

## 2024-01-10 MED ORDER — HYDROCODONE-ACETAMINOPHEN 10-325 MG PO TABS
1.0000 | ORAL_TABLET | ORAL | 0 refills | Status: AC | PRN
  Filled 2024-01-10: qty 20, 4d supply, fill #0

## 2024-01-12 NOTE — Progress Notes (Signed)
 Established Patient Office Visit  Subjective    Patient ID: Kaylee Maxwell, female    DOB: November 08, 1975  Age: 48 y.o. MRN: [id number]  CC:  Chief Complaint  Patient presents with   referral for pain management     Pt would like a referral for abdominal pain since about June     HPI Kaylee Maxwell presents with persistent abdominal pain. She has also been seeing a GI consultant but symptoms have been persistent. She reports that the severity waxes and wanes.   Outpatient Encounter Medications as of 01/10/2024  Medication Sig   acetaminophen  (TYLENOL ) 325 MG tablet Take 2 tablets (650 mg total) by mouth every 6 (six) hours as needed for mild pain (pain score 1-3), fever or moderate pain (pain score 4-6) (or Fever >/= 101).   aspirin  EC 81 MG tablet Take 81 mg by mouth in the morning. Swallow whole.   atorvastatin  (LIPITOR) 20 MG tablet Take 1 tablet (20 mg total) by mouth daily.   HYDROcodone -acetaminophen  (NORCO) 10-325 MG tablet Take 1 tablet by mouth every 4 (four) hours as needed for up to 7 days.   metoprolol  succinate (TOPROL -XL) 50 MG 24 hr tablet Take 1 tablet (50 mg total) by mouth daily. Take with or immediately following a meal. Please schedule follow up appt for more refills   NON FORMULARY Take by mouth See admin instructions. Seaweed extract - Place 1 dropperful in the mouth, then swallow once a day   NON FORMULARY Take by mouth See admin instructions. Soursop fruit extract - Place 1 dropperful in the mouth, then swallow once a day   OREGANO PO Take by mouth See admin instructions. Oregano oil - Place 1 dropperful in the mouth, then swallow once a day   pantoprazole  (PROTONIX ) 40 MG tablet Take 1 tablet (40 mg total) by mouth 2 (two) times daily.   traZODone  (DESYREL ) 50 MG tablet Take 1 tablet (50 mg total) by mouth at bedtime as needed for sleep   valsartan  (DIOVAN ) 160 MG tablet Take 1 tablet (160 mg total) by mouth daily.   amoxicillin -clavulanate (AUGMENTIN ) 875-125  MG tablet Take one tablet by mouth every 12 (twelve) hours for 10 days. (Patient not taking: Reported on 12/12/2023)   ferrous sulfate  325 (65 FE) MG EC tablet Take 1 tablet (325 mg total) by mouth daily. (Patient taking differently: Take 325 mg by mouth at bedtime.)   glucose blood (GNP TRUE METRIX GLUCOSE STRIPS) test strip Use as instructed   HYDROcodone -acetaminophen  (NORCO/VICODIN) 5-325 MG tablet Take one tablet by mouth every 6 (six) hours as needed for Pain for up to 3 days. Max Daily Amount: 4 tablets (Patient not taking: Reported on 01/10/2024)   hyoscyamine  (LEVSIN  SL) 0.125 MG SL tablet Place 1 tablet (0.125 mg total) under the tongue every 6 (six) hours as needed. (Patient not taking: Reported on 12/12/2023)   Lancets (FREESTYLE) lancets Use as directed   metFORMIN  (GLUCOPHAGE -XR) 500 MG 24 hr tablet Take 500 mg with breakfast daily by mouth  for one week , then increase to 500 mg by mouth twice daily with meal (Patient taking differently: Take 500 mg by mouth in the morning and at bedtime.)   No facility-administered encounter medications on file as of 01/10/2024.    Past Medical History:  Diagnosis Date   Abscess of sigmoid colon due to diverticulitis 08/08/2023   Anxiety 03/04/2018   Breast cancer screening 03/04/2018   Heart palpitations 03/04/2018   Monitor 9/21: normal sinus  rhythm, sinus tachycardia, Avg HR 110; occ PVCs   Hypertension    Iron deficiency    Irregular heartbeat 03/04/2018   Mild mitral regurgitation    Obesity    Osteochondroma    PVC's (premature ventricular contractions)    Scalp mass 10/09/2012   Sinus tachycardia    Type 2 diabetes mellitus [facility])     Past Surgical History:  Procedure Laterality Date   ANKLE FRACTURE SURGERY Right    BIOPSY  02/02/2023   Procedure: BIOPSY;  Surgeon: Wilhelmenia Aloha Raddle., MD;  Location: [facility];  Service: Gastroenterology;;   ROMAYNE Right    cyst removal from scalp     ESOPHAGOGASTRODUODENOSCOPY N/A  02/02/2023   Procedure: ESOPHAGOGASTRODUODENOSCOPY (EGD);  Surgeon: Wilhelmenia Aloha Raddle., MD;  Location: [facility];  Service: Gastroenterology;  Laterality: N/A;   EUS N/A 02/02/2023   Procedure: UPPER ENDOSCOPIC ULTRASOUND (EUS) RADIAL;  Surgeon: Wilhelmenia Aloha Raddle., MD;  Location: [facility];  Service: Gastroenterology;  Laterality: N/A;   left leg bone removal     ROBOTIC ASSITED PARTIAL NEPHRECTOMY Left 07/04/2023   Procedure: XI ROBOTIC ASSITED LEFT  PARTIAL NEPHRECTOMY;  Surgeon: Devere Lonni Righter, MD;  Location: [facility];  Service: Urology;  Laterality: Left;  180 MINUTES    Family History  Problem Relation Age of Onset   Diabetes Mother    Hypertension Mother    Obesity Mother    Hypertension Father    Cancer Paternal Aunt        pancreatic   Cancer Maternal Grandmother        lung   Autism spectrum disorder Sister     Social History   Socioeconomic History   Marital status: Single    Spouse name: Not on file   Number of children: 2   Years of education: Not on file   Highest education level: 12th grade  Occupational History   Not on file  Tobacco Use   Smoking status: Former    Current packs/day: 0.50    Average packs/day: 0.5 packs/day for 18.0 years (9.0 ttl pk-yrs)    Types: Cigarettes    Passive exposure: Never   Smokeless tobacco: Never  Vaping Use   Vaping status: Former   Substances: Nicotine , Flavoring  Substance and Sexual Activity   Alcohol use: Yes    Comment: occasional   Drug use: No   Sexual activity: Yes    Birth control/protection: None  Other Topics Concern   Not on file  Social History Narrative   Lives with her sons.    Social Drivers of Health   Financial Resource Strain: Medium Risk (09/08/2023)   Overall Financial Resource Strain (CARDIA)    Difficulty of Paying Living Expenses: Somewhat hard  Food Insecurity: No Food Insecurity (11/08/2023)   Hunger Vital Sign    Worried About Running Out of Food in the Last  Year: Never true    Ran Out of Food in the Last Year: Never true  Transportation Needs: No Transportation Needs (11/08/2023)   PRAPARE - Administrator, Civil Service (Medical): No    Lack of Transportation (Non-Medical): No  Physical Activity: Inactive (01/10/2024)   Exercise Vital Sign    Days of Exercise per Week: 0 days    Minutes of Exercise per Session: 0 min  Stress: No Stress Concern Present (09/08/2023)   [employer] of Occupational Health - Occupational Stress Questionnaire    Feeling of Stress: Not at all  Social Connections: Moderately Isolated (09/08/2023)  Social Connection and Isolation Panel    Frequency of Communication with Friends and Family: Three times a week    Frequency of Social Gatherings with Friends and Family: Once a week    Attends Religious Services: More than 4 times per year    Active Member of [employer] or Organizations: No    Attends Engineer, Structural: Not on file    Marital Status: Never married  Intimate Partner Violence: Not At Risk (11/23/2023)   Received from [facility]   HITS    Over the last 12 months how often did your partner physically hurt you?: Never    Over the last 12 months how often did your partner insult you or talk down to you?: Never    Over the last 12 months how often did your partner threaten you with physical harm?: Never    Over the last 12 months how often did your partner scream or curse at you?: Never    Review of Systems  All other systems reviewed and are negative.       Objective    BP (!) 148/80   Pulse 73   Ht 5' 5 (1.651 m)   Wt 207 lb 12.8 oz (94.3 kg)   LMP 01/03/2024 (Approximate)   SpO2 98%   BMI 34.58 kg/m   Physical Exam Vitals and nursing note reviewed.  Constitutional:      General: She is not in acute distress (appears to be in mod-severe pain). Cardiovascular:     Rate and Rhythm: Normal rate and regular rhythm.  Pulmonary:     Effort: Pulmonary effort is  normal.     Breath sounds: Normal breath sounds.  Abdominal:     Palpations: Abdomen is soft.     Tenderness: There is abdominal tenderness in the right lower quadrant.  Neurological:     General: No focal deficit present.     Mental Status: She is alert and oriented to person, place, and time.         Assessment & Plan:   Chronic abdominal pain  Other orders -     HYDROcodone -Acetaminophen ; Take 1 tablet by mouth every 4 (four) hours as needed for up to 7 days.  Dispense: 20 tablet; Refill: 0   After a detailed discussion. Patient will follow up with GI for further eval/mgt and possible referral to a specialized consultant. Patient agreed to 20 Norco every other month and if this is not sufficient will refer to pain management for further eval/mgt.   Return in about 2 months (around 03/12/2024).   Tanda Raguel SQUIBB, MD

## 2024-01-17 ENCOUNTER — Encounter: Payer: Self-pay | Admitting: Internal Medicine

## 2024-01-17 NOTE — Telephone Encounter (Signed)
 I am sorry to hear that she continues to have pain I would refer her to [facility] gastroenterology for persistent right upper quadrant pain, dilated bile duct; history of complicated diverticulitis felt to have resolved

## 2024-01-29 ENCOUNTER — Other Ambulatory Visit (HOSPITAL_COMMUNITY): Payer: Self-pay

## 2024-01-29 DIAGNOSIS — C642 Malignant neoplasm of left kidney, except renal pelvis: Secondary | ICD-10-CM | POA: Diagnosis not present

## 2024-02-13 ENCOUNTER — Other Ambulatory Visit (HOSPITAL_COMMUNITY): Payer: Self-pay

## 2024-03-04 ENCOUNTER — Telehealth: Payer: Self-pay | Admitting: *Deleted

## 2024-03-04 NOTE — Telephone Encounter (Signed)
 Copied from CRM #[id number]. Topic: Clinical - Medical Advice >> Mar 04, 2024 12:40 PM Emylou G wrote: Reason for CRM: please call patient back.. in regards pain meds for stomach..running out of refills.

## 2024-03-05 ENCOUNTER — Encounter: Admitting: Family Medicine

## 2024-03-05 NOTE — Telephone Encounter (Signed)
 Called pt for clarification and sent message to Dr. Tanda regarding matter. Will reach back out to pt when hear back from Dr. Tanda

## 2024-03-11 ENCOUNTER — Other Ambulatory Visit (HOSPITAL_COMMUNITY): Payer: Self-pay

## 2024-03-11 ENCOUNTER — Other Ambulatory Visit: Payer: Self-pay | Admitting: Student in an Organized Health Care Education/Training Program

## 2024-03-11 ENCOUNTER — Other Ambulatory Visit: Payer: Self-pay

## 2024-03-11 DIAGNOSIS — I10 Essential (primary) hypertension: Secondary | ICD-10-CM

## 2024-03-11 DIAGNOSIS — E785 Hyperlipidemia, unspecified: Secondary | ICD-10-CM

## 2024-03-11 MED ORDER — VALSARTAN 160 MG PO TABS
160.0000 mg | ORAL_TABLET | Freq: Every day | ORAL | 0 refills | Status: AC
  Filled 2024-03-11: qty 90, 90d supply, fill #0

## 2024-03-11 MED ORDER — ATORVASTATIN CALCIUM 20 MG PO TABS: 20.0000 mg | ORAL_TABLET | Freq: Every day | ORAL | 1 refills | Status: AC

## 2024-03-12 ENCOUNTER — Other Ambulatory Visit: Payer: Self-pay | Admitting: Family Medicine

## 2024-03-12 ENCOUNTER — Encounter: Payer: Self-pay | Admitting: Family Medicine

## 2024-03-12 ENCOUNTER — Other Ambulatory Visit: Payer: Self-pay

## 2024-03-12 DIAGNOSIS — G8929 Other chronic pain: Secondary | ICD-10-CM

## 2024-03-20 ENCOUNTER — Ambulatory Visit: Admitting: Student in an Organized Health Care Education/Training Program

## 2024-06-06 ENCOUNTER — Encounter: Admitting: Family Medicine
